# Patient Record
Sex: Female | Born: 1945 | Race: White | Hispanic: No | Smoking: Never smoker
Health system: Southern US, Community
[De-identification: ages and names within clinical notes are randomized; demographics above are authoritative.]

## PROBLEM LIST (undated history)

## (undated) DIAGNOSIS — Z87898 Personal history of other specified conditions: Secondary | ICD-10-CM

## (undated) DIAGNOSIS — E669 Obesity, unspecified: Secondary | ICD-10-CM

## (undated) DIAGNOSIS — N840 Polyp of corpus uteri: Secondary | ICD-10-CM

## (undated) DIAGNOSIS — M1990 Unspecified osteoarthritis, unspecified site: Secondary | ICD-10-CM

## (undated) DIAGNOSIS — N950 Postmenopausal bleeding: Secondary | ICD-10-CM

## (undated) DIAGNOSIS — Z8619 Personal history of other infectious and parasitic diseases: Secondary | ICD-10-CM

## (undated) DIAGNOSIS — R32 Unspecified urinary incontinence: Secondary | ICD-10-CM

## (undated) DIAGNOSIS — N951 Menopausal and female climacteric states: Secondary | ICD-10-CM

## (undated) DIAGNOSIS — Z9109 Other allergy status, other than to drugs and biological substances: Secondary | ICD-10-CM

## (undated) DIAGNOSIS — G4730 Sleep apnea, unspecified: Secondary | ICD-10-CM

## (undated) DIAGNOSIS — D649 Anemia, unspecified: Secondary | ICD-10-CM

## (undated) DIAGNOSIS — D219 Benign neoplasm of connective and other soft tissue, unspecified: Secondary | ICD-10-CM

## (undated) DIAGNOSIS — T7840XA Allergy, unspecified, initial encounter: Secondary | ICD-10-CM

## (undated) DIAGNOSIS — Z86010 Personal history of colonic polyps: Secondary | ICD-10-CM

## (undated) DIAGNOSIS — F419 Anxiety disorder, unspecified: Secondary | ICD-10-CM

## (undated) DIAGNOSIS — N924 Excessive bleeding in the premenopausal period: Secondary | ICD-10-CM

## (undated) DIAGNOSIS — M8580 Other specified disorders of bone density and structure, unspecified site: Secondary | ICD-10-CM

## (undated) DIAGNOSIS — N3010 Interstitial cystitis (chronic) without hematuria: Secondary | ICD-10-CM

## (undated) DIAGNOSIS — R300 Dysuria: Secondary | ICD-10-CM

## (undated) DIAGNOSIS — E039 Hypothyroidism, unspecified: Secondary | ICD-10-CM

## (undated) DIAGNOSIS — I10 Essential (primary) hypertension: Secondary | ICD-10-CM

## (undated) HISTORY — DX: Hypothyroidism, unspecified: E03.9

## (undated) HISTORY — DX: Unspecified osteoarthritis, unspecified site: M19.90

## (undated) HISTORY — DX: Personal history of other specified conditions: Z87.898

## (undated) HISTORY — DX: Postmenopausal bleeding: N95.0

## (undated) HISTORY — DX: Sleep apnea, unspecified: G47.30

## (undated) HISTORY — DX: Benign neoplasm of connective and other soft tissue, unspecified: D21.9

## (undated) HISTORY — DX: Anxiety disorder, unspecified: F41.9

## (undated) HISTORY — DX: Personal history of other infectious and parasitic diseases: Z86.19

## (undated) HISTORY — DX: Menopausal and female climacteric states: N95.1

## (undated) HISTORY — DX: Personal history of colonic polyps: Z86.010

## (undated) HISTORY — DX: Other specified disorders of bone density and structure, unspecified site: M85.80

## (undated) HISTORY — DX: Unspecified urinary incontinence: R32

## (undated) HISTORY — PX: MOUTH SURGERY: SHX715

## (undated) HISTORY — DX: Essential (primary) hypertension: I10

## (undated) HISTORY — DX: Excessive bleeding in the premenopausal period: N92.4

## (undated) HISTORY — DX: Interstitial cystitis (chronic) without hematuria: N30.10

## (undated) HISTORY — PX: HYSTEROSCOPY: SHX211

## (undated) HISTORY — DX: Obesity, unspecified: E66.9

## (undated) HISTORY — DX: Anemia, unspecified: D64.9

## (undated) HISTORY — DX: Other allergy status, other than to drugs and biological substances: Z91.09

## (undated) HISTORY — DX: Allergy, unspecified, initial encounter: T78.40XA

## (undated) HISTORY — DX: Polyp of corpus uteri: N84.0

## (undated) HISTORY — DX: Dysuria: R30.0

---

## 1972-03-06 HISTORY — PX: MYOMECTOMY: SHX85

## 1997-09-18 ENCOUNTER — Ambulatory Visit (HOSPITAL_COMMUNITY): Admission: RE | Admit: 1997-09-18 | Discharge: 1997-09-18 | Payer: Self-pay | Admitting: Otolaryngology

## 2001-02-27 ENCOUNTER — Emergency Department (HOSPITAL_COMMUNITY): Admission: EM | Admit: 2001-02-27 | Discharge: 2001-02-27 | Payer: Self-pay | Admitting: Emergency Medicine

## 2002-03-06 DIAGNOSIS — N951 Menopausal and female climacteric states: Secondary | ICD-10-CM

## 2002-03-06 DIAGNOSIS — R300 Dysuria: Secondary | ICD-10-CM

## 2002-03-06 HISTORY — DX: Menopausal and female climacteric states: N95.1

## 2002-03-06 HISTORY — DX: Dysuria: R30.0

## 2003-02-19 ENCOUNTER — Other Ambulatory Visit: Admission: RE | Admit: 2003-02-19 | Discharge: 2003-02-19 | Payer: Self-pay | Admitting: Obstetrics and Gynecology

## 2003-02-19 DIAGNOSIS — D219 Benign neoplasm of connective and other soft tissue, unspecified: Secondary | ICD-10-CM

## 2003-02-19 HISTORY — DX: Benign neoplasm of connective and other soft tissue, unspecified: D21.9

## 2003-03-03 ENCOUNTER — Ambulatory Visit (HOSPITAL_COMMUNITY): Admission: RE | Admit: 2003-03-03 | Discharge: 2003-03-03 | Payer: Self-pay | Admitting: Obstetrics and Gynecology

## 2003-03-07 DIAGNOSIS — N924 Excessive bleeding in the premenopausal period: Secondary | ICD-10-CM

## 2003-03-07 HISTORY — DX: Excessive bleeding in the premenopausal period: N92.4

## 2003-06-15 ENCOUNTER — Emergency Department (HOSPITAL_COMMUNITY): Admission: EM | Admit: 2003-06-15 | Discharge: 2003-06-15 | Payer: Self-pay

## 2004-03-06 DIAGNOSIS — N840 Polyp of corpus uteri: Secondary | ICD-10-CM

## 2004-03-06 HISTORY — DX: Polyp of corpus uteri: N84.0

## 2004-07-21 ENCOUNTER — Ambulatory Visit (HOSPITAL_COMMUNITY): Admission: RE | Admit: 2004-07-21 | Discharge: 2004-07-21 | Payer: Self-pay | Admitting: Obstetrics and Gynecology

## 2004-07-21 ENCOUNTER — Encounter (INDEPENDENT_AMBULATORY_CARE_PROVIDER_SITE_OTHER): Payer: Self-pay | Admitting: *Deleted

## 2004-08-03 ENCOUNTER — Other Ambulatory Visit: Admission: RE | Admit: 2004-08-03 | Discharge: 2004-08-03 | Payer: Self-pay | Admitting: Obstetrics and Gynecology

## 2004-08-22 ENCOUNTER — Ambulatory Visit (HOSPITAL_BASED_OUTPATIENT_CLINIC_OR_DEPARTMENT_OTHER): Admission: RE | Admit: 2004-08-22 | Discharge: 2004-08-22 | Payer: Self-pay | Admitting: Family Medicine

## 2004-08-28 ENCOUNTER — Ambulatory Visit: Payer: Self-pay | Admitting: Internal Medicine

## 2005-12-04 ENCOUNTER — Encounter: Admission: RE | Admit: 2005-12-04 | Discharge: 2005-12-04 | Payer: Self-pay | Admitting: Obstetrics and Gynecology

## 2006-06-25 ENCOUNTER — Encounter: Admission: RE | Admit: 2006-06-25 | Discharge: 2006-06-25 | Payer: Self-pay | Admitting: Family Medicine

## 2006-11-06 ENCOUNTER — Ambulatory Visit: Payer: Self-pay | Admitting: Cardiology

## 2007-01-22 ENCOUNTER — Encounter: Admission: RE | Admit: 2007-01-22 | Discharge: 2007-01-22 | Payer: Self-pay | Admitting: Family Medicine

## 2008-02-11 ENCOUNTER — Encounter: Admission: RE | Admit: 2008-02-11 | Discharge: 2008-02-11 | Payer: Self-pay | Admitting: Family Medicine

## 2009-03-06 DIAGNOSIS — R32 Unspecified urinary incontinence: Secondary | ICD-10-CM

## 2009-03-06 HISTORY — DX: Unspecified urinary incontinence: R32

## 2009-07-26 ENCOUNTER — Encounter: Admission: RE | Admit: 2009-07-26 | Discharge: 2009-07-26 | Payer: Self-pay | Admitting: Family Medicine

## 2010-03-01 ENCOUNTER — Ambulatory Visit (HOSPITAL_COMMUNITY): Admission: RE | Admit: 2010-03-01 | Payer: Self-pay | Admitting: Obstetrics and Gynecology

## 2010-03-24 ENCOUNTER — Emergency Department (HOSPITAL_COMMUNITY)
Admission: EM | Admit: 2010-03-24 | Discharge: 2010-03-25 | Payer: Self-pay | Source: Home / Self Care | Admitting: Emergency Medicine

## 2010-03-26 ENCOUNTER — Encounter: Payer: Self-pay | Admitting: Obstetrics and Gynecology

## 2010-03-28 LAB — DIFFERENTIAL
Basophils Absolute: 0 10*3/uL (ref 0.0–0.1)
Basophils Relative: 0 % (ref 0–1)
Eosinophils Absolute: 0.3 10*3/uL (ref 0.0–0.7)
Eosinophils Relative: 3 % (ref 0–5)
Lymphocytes Relative: 27 % (ref 12–46)
Lymphs Abs: 2.7 10*3/uL (ref 0.7–4.0)
Monocytes Absolute: 0.7 10*3/uL (ref 0.1–1.0)
Monocytes Relative: 7 % (ref 3–12)
Neutro Abs: 6.2 10*3/uL (ref 1.7–7.7)
Neutrophils Relative %: 63 % (ref 43–77)

## 2010-03-28 LAB — CBC
HCT: 40.1 % (ref 36.0–46.0)
Hemoglobin: 13.4 g/dL (ref 12.0–15.0)
MCH: 27.8 pg (ref 26.0–34.0)
MCHC: 33.4 g/dL (ref 30.0–36.0)
MCV: 83.2 fL (ref 78.0–100.0)
Platelets: 221 10*3/uL (ref 150–400)
RBC: 4.82 MIL/uL (ref 3.87–5.11)
RDW: 13.1 % (ref 11.5–15.5)
WBC: 9.8 10*3/uL (ref 4.0–10.5)

## 2010-03-28 LAB — BASIC METABOLIC PANEL
BUN: 14 mg/dL (ref 6–23)
CO2: 31 mEq/L (ref 19–32)
Calcium: 9.6 mg/dL (ref 8.4–10.5)
Chloride: 102 mEq/L (ref 96–112)
Creatinine, Ser: 0.74 mg/dL (ref 0.4–1.2)
GFR calc Af Amer: >60 mL/min (ref >60)
GFR calc non Af Amer: >60 mL/min (ref >60)
Glucose, Bld: 125 mg/dL — ABNORMAL HIGH (ref 70–99)
Potassium: 3.6 mEq/L (ref 3.5–5.1)
Sodium: 141 mEq/L (ref 135–145)

## 2010-06-21 DIAGNOSIS — N950 Postmenopausal bleeding: Secondary | ICD-10-CM

## 2010-06-21 HISTORY — DX: Postmenopausal bleeding: N95.0

## 2010-07-19 DIAGNOSIS — I10 Essential (primary) hypertension: Secondary | ICD-10-CM

## 2010-07-19 HISTORY — DX: Essential (primary) hypertension: I10

## 2010-07-19 NOTE — Assessment & Plan Note (Signed)
[facility]                            CARDIOLOGY OFFICE NOTE   [name], [name]                     MRN:          [id number]  DATE:11/06/2006                            DOB:          09-Apr-1945    HISTORY OF PRESENT ILLNESS:  The patient is a 65 year old female with  past medical history of hypertension, hypothyroidism who we were asked  to evaluate for bradycardia.   The patient has no prior cardiac history.  She does exercise routinely.  She typically does not have dyspnea on exertion, orthopnea, PND, pedal  edema, palpitations, presyncope, syncope or exertional chest pain.  She  does occasionally feel a thump in her chest, but this is short lived,  and this has not been a significant problem.  She was recently seen by  Dr. Smith Mince and was noted to be bradycardic on her electrocardiogram  with a heart rate in the high 40s.  Because of this, we were asked to  further evaluate.  There is no history of syncope.   MEDICATIONS:  1. Alendronate 70 mg week.  2. Benazepril 20 mg p.o. daily.  3. Hydrochlorothiazide 25 mg p.o. daily.  4. Cytomel 25 mcg one half p.o. daily.  5. Levothyroxine 88 mcg p.o. daily.   ALLERGIES:  SULFA.   SOCIAL HISTORY:  She does not smoke.  She rarely consumes alcohol .   FAMILY HISTORY:  Negative for coronary artery disease.   PAST MEDICAL HISTORY:  1. Significant for hypertension, but she denies any hyperlipidemia or      diabetes mellitus.  2. She does have hypothyroidism.  3. History of sleep apnea.  4. Osteopenia and vitamin D deficiency.  5. History of anxiety.  6. History of large fibroids and has had prior hysteroscopy.   REVIEW OF SYSTEMS:  She denies any headaches or fevers or chills.  There  is no productive cough or hemoptysis.  There is no dysphagia,  odynophagia, melena or hematochezia.  There is no dysuria, hematuria.  No rash, or seizure activity.  No orthopnea, PND or pedal edema.  No  claudication.  The remaining systems are negative.   PHYSICAL EXAMINATION:  VITAL SIGNS:  Blood pressure 159/80, pulse 60,  weight 171 pounds.  GENERAL:  Well-developed, well-nourished.  No acute distress.  She does  not appear to be depressed, although, mildly anxious.  SKIN:  Warm and dry.  BACK:  Normal.  HEENT:  Normal with normal eyelids.  NECK:  Supple with normal upstroke bilaterally.  Normal bruits.  No  jugular venous distention.  I cannot appreciate thyromegaly.  CHEST:  Clear to auscultation with normal expansion.  CARDIOVASCULAR:  Regular rate and rhythm.  There is an S4.  I cannot  appreciate a murmur or a rub.  ABDOMEN:  Nontender, positive bowel sounds.  No hepatosplenomegaly.  No  mass appreciated.  There is no abdominal bruits.  She has 2+ femoral  pulses bilaterally.  No bruits.  EXTREMITIES:  No edema, and I can palpate no cords.  She has 2+ dorsalis  pedis pulses bilaterally.  NEUROLOGICAL:  Grossly intact.   STUDIES:  Electrocardiogram today shows a sinus rhythm at a rate of 59.  There are no significant ST changes.   DIAGNOSES:  1. Bradycardia - the patient has no symptoms with this including no      recent syncope and no unexpected fatigue.  She also does not have      significant dyspnea or chest pain.  We will not pursue further      cardiac workup at this time.  2. History of an occasional pound - palpitations - we discussed      this, and this does not appear to be significantly bothersome to      Mrs. Theroux.  If her symptoms worsen in the future, then I have      offered an event monitor to more fully assess.  She will contact us      if she would like to pursue this.  3. Hypertension - her blood pressure is mildly elevated, and this will      need to be tracked in the future.  Her benazepril could be      increased as needed +/- the addition of a beta blocker.  4. Hypothyroidism - she will continue on her Levothyroxine.   We will see her back on  an as needed basis, and she is to contact us if  her palpations worsen and she would like to proceed with the event  monitor.     Madolyn Frieze Jens Som, MD, [facility]  Electronically Signed    BSC/MedQ  DD: 11/06/2006  DT: 11/07/2006  Job #: [id number]   cc:   Talmadge Coventry, M.D.

## 2010-07-22 ENCOUNTER — Encounter (HOSPITAL_COMMUNITY): Payer: Medicare Other

## 2010-07-22 ENCOUNTER — Other Ambulatory Visit: Payer: Self-pay | Admitting: Obstetrics and Gynecology

## 2010-07-22 LAB — CBC
HCT: 42 % (ref 36.0–46.0)
Hemoglobin: 13.7 g/dL (ref 12.0–15.0)
MCH: 27.8 pg (ref 26.0–34.0)
MCHC: 32.6 g/dL (ref 30.0–36.0)
MCV: 85.4 fL (ref 78.0–100.0)
Platelets: 267 10*3/uL (ref 150–400)
RBC: 4.92 MIL/uL (ref 3.87–5.11)
RDW: 13.6 % (ref 11.5–15.5)
WBC: 6.7 10*3/uL (ref 4.0–10.5)

## 2010-07-22 LAB — BASIC METABOLIC PANEL
BUN: 13 mg/dL (ref 6–23)
CO2: 28 mEq/L (ref 19–32)
Calcium: 9.6 mg/dL (ref 8.4–10.5)
Chloride: 99 mEq/L (ref 96–112)
Creatinine, Ser: 0.63 mg/dL (ref 0.4–1.2)
GFR calc Af Amer: >60 mL/min (ref >60)
GFR calc non Af Amer: >60 mL/min (ref >60)
Glucose, Bld: 102 mg/dL — ABNORMAL HIGH (ref 70–99)
Potassium: 3 mEq/L — ABNORMAL LOW (ref 3.5–5.1)
Sodium: 139 mEq/L (ref 135–145)

## 2010-07-22 NOTE — H&P (Signed)
NAMECAREEN, MAUCH            ACCOUNT NO.:  [id number]   MEDICAL RECORD NO.:  [id number]          PATIENT TYPE:  AMB   LOCATION:  [facility]                           [facility]   PHYSICIAN:  Hal Morales, M.D.DATE OF BIRTH:  09-18-1945   DATE OF ADMISSION:  DATE OF DISCHARGE:                                HISTORY & PHYSICAL   HISTORY OF PRESENT ILLNESS:  [name] is a 65 year old divorced  menopausal white female who presents for a hysteroscopy with D&C because of  uterine fibroids and post menopausal bleeding.  In September 2005 the  patient experienced menstrual-like bleeding, which lasted 8-9 days, during  which times she changed one pad daily.  The patient also reports mild  cramping during this episode.  At that time due to the patient's aversion to  pelvic exams and fear of anesthesia, she declined further evaluation via  endometrial biopsy and hysteroscopy.  The patient had had an MRI in January  2005, which showed an enlarged multilobular uterus with approximately 11  leiomyomata, the largest measuring 7 cm x 8 cm x 10 cm.  The patient's  endometrial cavity was distorted secondary to leiomyomatas changes in the  cervical and lower uterine body segment.  The lower endometrial cavity was  displaced to the right.  A leiomyoma measuring just over 15 mm in size was  seen in the submucosal region and distorted the endometrial canal from the  posterior right aspect.  Two submucosal leiomyomas measuring 1 cm and  approximately 18 mm in size were seen to significantly distort the fundal  portion of the endometrial cavity.  There appeared to be no abnormal  thickening of the endometrium and the overall uterine size measured 17 cm x  10 cm x 10-12 cm.  There was some extrinsic compression against the bladder  and rectum as a result of the leiomyomatas enlargement and exophytic 4 x 5  cm leiomyoma emanating from the posterior lower body segment of the uterus  was also seen,  with some extrinsic compression along the anterior rectal  wall.  The patient's ovaries were not definitively identified nor was there  any evidence of pelvic adenopathy.  In November 2005, at the patient's  request, a repeat MRI was performed, which revealed no changes from the  previous study.  In April 2006 the patient experienced she had another  episode of post menopausal bleeding (though a TSH done by her primary  physician was within normal limits), which lasted 1-1/2 weeks and required  the change of one pad daily.  After considerable discussions regarding  hysteroscopy with D&C to include its risks and the possibility of such  procedure being precluded by her fibroids, along with the fact that the  exact cause of her post menopausal bleeding may not be identified, the  patient has now decided to proceed with the hysteroscopy and D&C procedure.   OBSTETRICAL HISTORY:  Gravida 0.   GYNECOLOGICAL HISTORY:  Menarche 65 years old.  The patient has been  menopausal since age 45.  She has no history of sexually transmitted  diseases or abnormal pap smears.  Her last normal pap smear was 2004.   MEDICAL HISTORY:  1.  Hypothyroidism.  2.  Hypertension.  3.  Anxiety/depression.  4.  Sleep apnea.  5.  Stress urinary incontinence.  6.  Osteopenia.  7.  Mild hyperlipidemia.  8.  Carpal tunnel syndrome.  9.  Claustrophobia.  10. Right foot fracture.   SURGICAL HISTORY:  1.  In 1985 myomectomy.  2.  In 2006 gum surgery requiring a bone graft.  3.  The patient denies any history of blood transfusions.   FAMILY HISTORY:  Positive for hypertension, strokes, colon cancer, breast  cancer, (paternal cousin at age 79).   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   HABITS:  The patient does not use tobacco.  She occasionally consumes  alcohol.   CURRENT MEDICATIONS:  1.  Levothyroxine 88 mcg  one tablet daily.  2.  Cytomel 12.5 mcg one tablet daily.  3.  Benazepril 10 mg one tablet daily.  4.  Maxzide (generic) 37.5/25 one tablet daily.  5.  Vitamin C 1000 mg daily.   ALLERGIES:  SULFA WHICH CAUSES A RASH.   REVIEW OF SYSTEMS:  The patient experiences stress urinary incontinence.  She does wears glasses.  She experiences mild constipation, sinusitis, and  positional vertigo.   PHYSICAL EXAMINATION:  VITAL SIGNS:  Blood pressure 128/80.  Height 5 feet  1/4 inch tall, weight 182 pounds.  NECK:  Supple without masses.  There is no adenopathy or thyromegaly.  HEART:  Regular rate and rhythm.  There is no murmur.  LUNGS:  Clear to auscultation.  There are no wheezes, rales, or rhonchi.  BACK:  No CVA tenderness.  ABDOMEN:  Bowel sounds are present.  It is soft without tenderness,  guarding, rebound, or organomegaly.  EXTREMITIES:  No cyanosis, clubbing, or edema.  NEUROLOGIC:  Cranial nerves II-XII are within normal limits.  Motor strength  was symmetric.  Sensory was intact distally to light touch.  The patient's  gait was within normal limits.  Heel toe tandem walking were within normal  limits.  Negative Romberg.  PELVIC:  Refused by patient due to her aversion to the same.  The patient's  pelvic exam done November 18, 2003 revealed EGVUS within normal limits.  The vagina within normal limits.  The cervix was without tenderness.  The  uterus was enlarged with posterior irregularity, approximately 16 weeks  size, but the exam was limited by poor patient tolerance of the exam.  Adnexa was without any masses or tenderness.  Rectovaginal exam confirmed.   IMPRESSION:  1.  Post menopausal bleeding.  2.  Large uterine fibroids.   DISPOSITION:  Several lengthy discussions were held with the patient  regarding the indications for her procedure along with its risks, which  include but are not limited to, reaction to anesthesia, damage to adjacent organs, infection, and  excessive bleeding.  The patient further understands  that her procedure may be precluded by the  positioning of her uterine fibroids and that this procedure may not  specifically identify nor relieve her symptoms of post menopausal bleeding.  The patient has consented to proceed with a hysteroscopy with D&C and  fibroid removal at [facility] in [place] on Jul 21, 2004 at 9:30  a.m.      EJP/MEDQ  D:  07/20/2004  T:  07/20/2004  Job:  [id number]

## 2010-07-22 NOTE — Procedures (Signed)
[name], [name]            ACCOUNT NO.:  [id number]   MEDICAL RECORD NO.:  [id number]          PATIENT TYPE:  OUT   LOCATION:  SLEEP CENTER                 FACILITY:  [facility]   PHYSICIAN:  Clinton D. Maple Hudson, M.D. DATE OF BIRTH:  11-06-1945   DATE OF STUDY:  08/22/2004                              NOCTURNAL POLYSOMNOGRAM   REFERRING PHYSICIAN:  Sharlot Gowda, M.D.   INDICATION FOR STUDY:  Hypersomnia with sleep apnea.   Epworth Sleepiness Score 15/24, BMI 33, weight 177 pounds.   SLEEP ARCHITECTURE:  Total sleep time 229 minutes with sleep efficiency only  49%/  Stage I was 7%, stage II 27%, stages III and IV 49%, REM 16% of total  sleep time.  Sleep latency 53 minutes, REM latency 128 minutes, awake after  sleep onset 173 minutes, arousal index 20.  No bedtime medication taken.   RESPIRATORY DATA:  Respiratory disturbance index (RDI, AHI) 24.1 obstructive  events per hour, indicating moderate obstructive sleep apnea/hypopnea  syndrome.  There were 27 obstructive apneas and 65 hypopneas.  Events were  not positional.  REM RDI 32.  She qualified for split study protocol as  requested, but was uncomfortable with all CPAP masks tried (mask too heavy,  nose burns, etc.)  Technician tried many masks, finally settled with her on  a Respironics Comfort Light Series II petite nasal pillows mask.  She  sustained sleep only briefly at 11 CWP, the woke and did not return to sleep  for any sustained interval subsequently.  Adequate split protocol titration  could not be achieved.   OXYGEN DATA:  Moderate snoring with oxygen desaturation to a nadir of 83%.  Mean oxygen saturation through the study was 96% on room air.   CARDIAC DATA:  Normal sinus rhythm with occasional PVC.   MOVEMENT/PARASOMNIA:  A total of 60 limb jerks were recorded of which 6 were  associated with arousal or awakening for a periodic limb movement with  arousal index of 1.6 per hour which is probably insignificant.   Bathroom x  2.  The patient complained of several discomforts.  She required ear plugs  to keep out ambient noise and was quite uncomfortable with routine  attachment and removal of monitoring leads.   IMPRESSION/RECOMMENDATIONS:  1.  Moderate obstructive sleep apnea/hypopnea syndrome, RDI 24 per hour with      moderate snoring and oxygen desaturation to 83%.  2.  Very limited tolerance for extrinsic discomforts including initial trial      with CPAP.  It may be possible for her to      work with Daphine Deutscher, Laboratory Director, for daytime      desensitization and improved CPAP tolerance, perhaps supplemented at      home by a sleep medication.  Otherwise need to consider alternative      therapies.      Clinton D. Maple Hudson, M.D.  Diplomat    CDY/MEDQ  D:  08/28/2004 13:00:03  T:  08/28/2004 21:58:43  Job:  [id number]   cc:   Sharlot Gowda, M.D.  [address]  [place], [place] [address]  Fax: [phone]

## 2010-07-22 NOTE — Op Note (Signed)
[name], [name]            ACCOUNT NO.:  [id number]   MEDICAL RECORD NO.:  [id number]          PATIENT TYPE:  AMB   LOCATION:  [facility]                           [facility]ILITY:  [facility]   PHYSICIAN:  Hal Morales, M.D.DATE OF BIRTH:  12/25/1945   DATE OF PROCEDURE:  07/21/2004  DATE OF DISCHARGE:                                 OPERATIVE REPORT   PREOPERATIVE DIAGNOSES:  1.  Postmenopausal bleeding.  2.  Uterine fibroids.   POSTOPERATIVE DIAGNOSES:  1.  Postmenopausal bleeding.  2.  Uterine fibroids.  3.  Endometrial polyps.   PROCEDURE:  Operative hysteroscopy, endometrial polypectomy, uterine  curettage.   SURGEON:  Hal Morales, M.D.   OBSERVER:  Laurena Slimmer, student.   ANESTHESIA:  General LMA.   ESTIMATED BLOOD LOSS:  Less than 10 cc.   COMPLICATIONS:  None.   FINDINGS:  The uterus was enlarged to approximately 18-week size on bimanual  examination.  It sounded to 13 cm.  There was a 4 cm endometrial polyp and a  6 cm endometrial polyp with an apparent necrotic tip.  There was a small  amount of endometrial curettings.   PROCEDURE:  The patient was taken to the operating room after appropriate  identification and placed on the operating table.  After the attainment of  adequate general anesthesia, she was placed in the lithotomy position.  The  perineum and vagina were prepped with multiple layers of Betadine and draped  as a sterile field.  A red Robinson catheter was used to empty the bladder.  A Peterson speculum was placed in the vagina, and a single-tooth tenaculum  placed on the anterior cervix.  A paracervical block was achieved with a  total of 10 cc of 2% Xylocaine in the 5 and 7 o'clock positions.  The cervix  was dilated to first accommodate the uterine sound and then to accommodate  the diagnostic hysteroscope.  The diagnostic hysteroscope was inserted and  the above-noted findings made.  Because the hysteroscope would not reach the  fundus of  the uterus, it was decided to remove the hysteroscope, and the  Randall stone forceps were used in combination with sharp curettage to  remove the two endometrial polyps.  Visualization of the uterine cavity  after polyp removal documented that the polyps had been removed, and only a  small anterior uterine fibroid remained.  All instruments were then removed  from the  endometrial cavity, and the patient was awakened from general anesthesia and  taken to the recovery room in satisfactory condition, having tolerated the  procedure well, with sponge and instrument counts correct.   SPECIMENS TO PATHOLOGY:  4 cm polyp, 6 cm polyp, and endometrial curettings.      VPH/MEDQ  D:  07/21/2004  T:  07/21/2004  Job:  [id number]   cc:   Talmadge Coventry, M.D.  [address]  [place]  [place] [address]  Fax: [phone]

## 2010-07-25 ENCOUNTER — Emergency Department (HOSPITAL_COMMUNITY)
Admission: EM | Admit: 2010-07-25 | Discharge: 2010-07-25 | Disposition: A | Discharge status: Home / Self Care | Payer: Medicare Other | Attending: Emergency Medicine | Admitting: Emergency Medicine

## 2010-07-25 DIAGNOSIS — E039 Hypothyroidism, unspecified: Secondary | ICD-10-CM | POA: Insufficient documentation

## 2010-07-25 DIAGNOSIS — G4730 Sleep apnea, unspecified: Secondary | ICD-10-CM | POA: Insufficient documentation

## 2010-07-25 DIAGNOSIS — F411 Generalized anxiety disorder: Secondary | ICD-10-CM | POA: Insufficient documentation

## 2010-07-25 DIAGNOSIS — I10 Essential (primary) hypertension: Secondary | ICD-10-CM | POA: Insufficient documentation

## 2010-07-25 DIAGNOSIS — R0789 Other chest pain: Secondary | ICD-10-CM | POA: Insufficient documentation

## 2010-07-25 LAB — POCT CARDIAC MARKERS
CKMB, poc: <1 ng/mL — ABNORMAL LOW (ref 1.0–8.0)
Myoglobin, poc: 41.7 ng/mL (ref 12–200)
Troponin i, poc: <0.05 ng/mL (ref 0.00–0.09)

## 2010-07-29 ENCOUNTER — Other Ambulatory Visit: Payer: Self-pay | Admitting: Obstetrics and Gynecology

## 2010-07-29 ENCOUNTER — Ambulatory Visit (HOSPITAL_COMMUNITY)
Admission: RE | Admit: 2010-07-29 | Discharge: 2010-07-29 | Disposition: A | Discharge status: Home / Self Care | Payer: Medicare Other | Source: Ambulatory Visit | Attending: Obstetrics and Gynecology | Admitting: Obstetrics and Gynecology

## 2010-07-29 DIAGNOSIS — Z01812 Encounter for preprocedural laboratory examination: Secondary | ICD-10-CM | POA: Insufficient documentation

## 2010-07-29 DIAGNOSIS — IMO0002 Reserved for concepts with insufficient information to code with codable children: Secondary | ICD-10-CM | POA: Insufficient documentation

## 2010-07-29 DIAGNOSIS — N840 Polyp of corpus uteri: Secondary | ICD-10-CM | POA: Insufficient documentation

## 2010-07-29 DIAGNOSIS — Y9210 Unspecified residential institution as the place of occurrence of the external cause: Secondary | ICD-10-CM | POA: Insufficient documentation

## 2010-07-29 DIAGNOSIS — D250 Submucous leiomyoma of uterus: Secondary | ICD-10-CM | POA: Insufficient documentation

## 2010-07-29 DIAGNOSIS — Z01818 Encounter for other preprocedural examination: Secondary | ICD-10-CM | POA: Insufficient documentation

## 2010-07-29 DIAGNOSIS — N950 Postmenopausal bleeding: Secondary | ICD-10-CM | POA: Insufficient documentation

## 2010-07-29 LAB — POTASSIUM: Potassium: 3.3 mEq/L — ABNORMAL LOW (ref 3.5–5.1)

## 2010-08-01 ENCOUNTER — Inpatient Hospital Stay (HOSPITAL_COMMUNITY)
Admission: AD | Admit: 2010-08-01 | Discharge: 2010-08-01 | Disposition: A | Discharge status: Home / Self Care | Payer: Medicare Other | Source: Ambulatory Visit | Attending: Obstetrics and Gynecology | Admitting: Obstetrics and Gynecology

## 2010-08-01 DIAGNOSIS — F411 Generalized anxiety disorder: Secondary | ICD-10-CM | POA: Insufficient documentation

## 2010-08-01 DIAGNOSIS — I10 Essential (primary) hypertension: Secondary | ICD-10-CM | POA: Insufficient documentation

## 2010-08-01 DIAGNOSIS — R109 Unspecified abdominal pain: Secondary | ICD-10-CM | POA: Insufficient documentation

## 2010-08-01 LAB — COMPREHENSIVE METABOLIC PANEL
ALT: 13 U/L (ref 0–35)
AST: 14 U/L (ref 0–37)
Albumin: 3.7 g/dL (ref 3.5–5.2)
Alkaline Phosphatase: 56 U/L (ref 39–117)
BUN: 13 mg/dL (ref 6–23)
CO2: 30 mEq/L (ref 19–32)
Calcium: 9.6 mg/dL (ref 8.4–10.5)
Chloride: 97 mEq/L (ref 96–112)
Creatinine, Ser: 0.69 mg/dL (ref 0.4–1.2)
GFR calc Af Amer: >60 mL/min (ref >60)
GFR calc non Af Amer: >60 mL/min (ref >60)
Glucose, Bld: 117 mg/dL — ABNORMAL HIGH (ref 70–99)
Potassium: 3.7 mEq/L (ref 3.5–5.1)
Sodium: 138 mEq/L (ref 135–145)
Total Bilirubin: 0.4 mg/dL (ref 0.3–1.2)
Total Protein: 7.1 g/dL (ref 6.0–8.3)

## 2010-08-01 LAB — CBC
HCT: 40.1 % (ref 36.0–46.0)
Hemoglobin: 13.1 g/dL (ref 12.0–15.0)
MCH: 27.5 pg (ref 26.0–34.0)
MCHC: 32.7 g/dL (ref 30.0–36.0)
MCV: 84.2 fL (ref 78.0–100.0)
Platelets: 239 10*3/uL (ref 150–400)
RBC: 4.76 MIL/uL (ref 3.87–5.11)
RDW: 13.2 % (ref 11.5–15.5)
WBC: 8.4 10*3/uL (ref 4.0–10.5)

## 2010-08-01 LAB — URINALYSIS, ROUTINE W REFLEX MICROSCOPIC
Bilirubin Urine: NEGATIVE
Glucose, UA: NEGATIVE mg/dL
Ketones, ur: NEGATIVE mg/dL
Nitrite: NEGATIVE
Protein, ur: NEGATIVE mg/dL
Specific Gravity, Urine: <1.005 — ABNORMAL LOW (ref 1.005–1.030)
Urobilinogen, UA: 0.2 mg/dL (ref 0.0–1.0)
pH: 6.5 (ref 5.0–8.0)

## 2010-08-01 LAB — URINE MICROSCOPIC-ADD ON

## 2010-08-05 NOTE — Op Note (Signed)
[name], [name]            ACCOUNT NO.:  [id number]  MEDICAL RECORD NO.:  [id number]           PATIENT TYPE:  O  LOCATION:  [facility]                          [facility]  PHYSICIAN:  Hal Morales, M.D.DATE OF BIRTH:  [birth year]  DATE OF PROCEDURE:  07/29/2010 DATE OF DISCHARGE:                              OPERATIVE REPORT   PREOPERATIVE DIAGNOSES:  Postmenopausal bleeding, uterine fibroids, history of endometrial polyp.  POSTOPERATIVE DIAGNOSES:  Postmenopausal bleeding, uterine fibroids, history of endometrial polyp, and endometrial polyps.  PROCEDURE:  Hysteroscopy dilation and curettage.  SURGEON:  Dr. Dierdre Forth  ANESTHESIA:  General.  ESTIMATED BLOOD LOSS:  Less than 10 mL.  COMPLICATIONS:  The patient's cervix was markedly stenotic and atrophic cervix made grasping the cervix for traction very difficult, so that the operative hysteroscope could not be inserted into the uterus even after several attempts.  On the last attempt at insertion of the operative hysteroscope, the fluid deficit increased significantly over approximately 2 minutes from [id number] mL and uterine distension could not be maintained.  Though no definite site of perforation could be identified.  The presumptive diagnosis was uterine perforation and the procedure was discontinued.  PROCEDURE:  The patient was taken to the operating room after appropriate identification and placed on the operating table.  After the attainment of adequate general anesthesia, she was placed in the lithotomy position.  The perineum and vagina were prepped with multiple layers of Betadine and a red Robinson catheter used to empty the bladder.  The perineum was draped as a sterile field.  A Graves speculum was placed in the posterior vagina.  A paracervical block was achieved with a total of 10 mL of 2% Xylocaine in the 5 and 7 o'clock positions. The speculum was then removed and the aforementioned skin tag  on the left posterior perineum was infiltrated with 2% Xylocaine.  It was sharply excised and a suture of 3-0 Monocryl used to close the excision site with adequate hemostasis.  The Graves speculum was replaced and a single-tooth tenaculum placed on the cervix.  The uterus sounded to more than 10 cm but sounding was difficult because of the acute angle of the endometrial cavity.  The cervix was then dilated to accommodate the diagnostic hysteroscope and this was inserted with the findings of several small endometrial polyps and submucosal myomata with minimal portion of the myoma within the endometrial cavity.  The remainder of the endometrium was atrophic.  The diagnostic hysteroscope was removed, and the cervix was dilated to accommodate the operative hysteroscope, however, insertion of the operative hysteroscope could not be achieved because of a combination of atrophy of the cervix precluding grasping for traction with out tearing through the atrophic tissue, the anterior angle of the endometrial cavity, and the increased size of the operative hysteroscope of 2.9 cm.  Several attempts were made to insert the operative hysteroscope and then the endometrial cavity was curetted on two occasions with the production of a small amount of tissue.  On the last attempt at placement of the operative hysteroscope, the fluid deficit went from 700 mL to 1000 mL within 2 minutes  with the presumptive diagnosis of uterine perforation.  The procedure was discontinued.  All instruments were removed from the vagina, and the patient awakened from general anesthesia and taken to the recovery room in satisfactory condition, having tolerated the procedure well with sponge and instrument counts correct.  She is to be observed over 2 hours to determine her stability for discharge home.  SPECIMENS TO PATHOLOGY:  Endometrial curettings.  Discharge instructions printed instructions from the [facility] for  hysteroscopy D and C.  DISCHARGE MEDICATIONS:  Home medications; 1. Benazepril-hydrochlorothiazide 20/12.5 mg 2 tablets p.o. daily. 2. Carvedilol 6.5 mg one tablet twice daily. 3. Hydralazine 25 mg 3 tablets p.o. twice daily. 4. Liothyronine 12.5 mcg one tablet daily. 5. Levothroid 88 mcg one p.o. daily. 6. Clonidine 0.1 mg one p.o. daily. 7. Alendronate 70 mg one p.o. weekly.  Prescription medications: Ibuprofen 600 mg p.o. q.6 h for 3 days and then p.o. q.6 h p.r.n. pain.  FOLLOWUP INSTRUCTIONS:  The patient is to follow up with Dr. Pennie Rushing in 2 weeks, and she has that appointment.     Hal Morales, M.D.     VPH/MEDQ  D:  07/29/2010  T:  07/30/2010  Job:  [id number]  Electronically Signed by Dierdre Forth M.D. on 08/05/2010 01:02:42 AM

## 2010-08-05 NOTE — H&P (Signed)
[name], [name]            ACCOUNT NO.:  [id number]  MEDICAL RECORD NO.:  [id number]           PATIENT TYPE:  O  LOCATION:  [facility]                          [facility]  [facility]  PHYSICIAN:  Hal Morales, M.D.DATE OF BIRTH:  [birth year]  DATE OF ADMISSION:  07/29/2010 DATE OF DISCHARGE:                             HISTORY & PHYSICAL   HISTORY OF PRESENT ILLNESS:  The patient is a 65 year old white single female para 0 who presents for evaluation of postmenopausal bleeding. The patient underwent menopause approximately 10 years ago and has had several episodes of postmenopausal bleeding with this most recent one occurring over approximately the last year.  The bleeding is not always only spotting and is intermittent, unrelated to any activities.  The patient was unable to tolerate pelvic examination in the office that would allow any further evaluation and so she is brought to the operating room for hysteroscopic evaluation.  PAST MEDICAL HISTORY:  GYNECOLOGICAL HISTORY:  The patient had a previous abdominal myomectomy in 1985 and since that time has had recurrence of the uterine fibroids, generally without symptoms.  She has no history of abnormal Pap smears and her last Pap smear was in June 2011 and was within normal limits.  The patient had a previous episode of postmenopausal bleeding in 2005 and in May 2006 underwent hysteroscopy and polypectomy with benign endometrial polyp noted.  OBSTETRICAL HISTORY:  The patient has never had a pregnancy.  MEDICAL HISTORY:  The patient is followed for chronic hypertension, hypothyroidism, depression, anxiety, and osteopenia.  FAMILY HISTORY:  Positive for chronic hypertension and strokes as well as migraine headaches.  CURRENT MEDICATIONS: 1. Clonidine 0.1 mg daily. 2. Carvedilol 12.5 mg p.o. b.i.d. 3. Benazepril and hydrochlorothiazide 20/12.5 mg 1 p.o. daily. 4. Levothyroxine 88 mcg p.o. daily. 5. Liothyronine 12.5 mg  p.o. daily. 6. Alendronate 70 mg p.o. q. week. 7. Vitamin D 2000 International Units daily. 8. Over-the-counter B and C complex.  DRUG SENSITIVITIES: 1. SULFA. 2. ERYTHROMYCIN. 3. EPINEPHRINE.  SOCIAL HISTORY:  The patient is single and works in Therapist, art work.  She does not smoke cigarettes.  She drinks less than 1 alcoholic beverage per week.  REVIEW OF SYSTEMS:  Positive for severe anxiety.  The patient actually had a visit to the [facility] on Jul 25, 2010 because of a panic attack and was started on lorazepam.  PHYSICAL EXAMINATION:  GENERAL:  The patient is an obese white female in no acute distress. VITAL SIGNS:  Temperature is 98.4, pulse is 53, respirations 16, blood pressure is 150/78, and weight is 187 pounds. LUNGS:  Clear. HEART:  Regular rate and rhythm. ABDOMEN:  Soft with a suprapubic mass that extends to within 5 cm of the umbilicus and is nontender.  Bowel sounds are normal. EXTREMITIES:  No clubbing, cyanosis, or edema. PELVIC:  EGBUS within normal limits.  The vagina is atrophic.  The cervix is small and atrophic.  Bimanual examination is poorly tolerated by the patient but is consistent with the aforementioned large pelvic mass most consistent with uterine fibroids.  Rectovaginal examination is negative.  IMAGING:  Ultrasound evaluation done  on September 02, 2009 documents these large uterine fibroids with an 8-cm pedunculated fundal fibroid.  The ovary on the right is visualized and is within normal limits.  IMPRESSION: 1. Second episode of postmenopausal bleeding in the last 10 years with     previous episode in 2006 revealing a benign endometrial polyp. 2. Poor tolerance of pelvic examination.  DISPOSITION:  A discussion was held with the patient concerning the need for evaluation of her postmenopausal bleeding and she agrees to undergo hysteroscopy and polypectomy if indeed a polyp is found.  The risks  of anesthesia, bleeding, infection, and damage to adjacent organs as well as uterine perforation are explained to the patient and she wishes to proceed.  This was done at [facility] on Jul 29, 2010.     Hal Morales, M.D.     VPH/MEDQ  D:  07/29/2010  T:  07/30/2010  Job:  [id number]  Electronically Signed by Dierdre Forth M.D. on 08/05/2010 01:03:12 AM

## 2010-08-15 NOTE — Consult Note (Signed)
NAMEMARVEL, SAPP            ACCOUNT NO.:  [id number]  MEDICAL RECORD NO.:  [id number]           PATIENT TYPE:  O  LOCATION:  [facility]                         [facility]  PHYSICIAN:  Janine Limbo, M.D.DATE OF BIRTH:  07-Jun-1945  DATE OF CONSULTATION:  08/01/2010 DATE OF DISCHARGE:  08/01/2010                                CONSULTATION   HISTORY OF PRESENT ILLNESS:  Ms. Pharris is a 65 year old female, gravida 0, who presents complaining of vague abdominal pain.  She says she thinks it may be "gas."  The patient has been followed at the [facility] for [facility].  On Jul 29, 2010, the patient had hysteroscopy with dilatation and curettage performed by Dr. Dierdre Forth.  The patient complained of postmenopausal bleeding.  She has a known history of endometrial polyps and uterine fibroids.  The patient cervix was noted to be markedly stenotic and atrophic.  It was very difficult to grasp the cervix and to apply traction.  It was difficult to insert the hysteroscope.  There was noted to be a dramatic change in the fluid deficit, although there was no definitive sign of perforation that could be identified.  The presumptive diagnosis was uterine perforation and the procedure was discontinued.  The patient was discharged to home doing well.  The patient reports that she did notice a small amount of vaginal bleeding on Jul 30, 2010.  She reports today that she has had a vague abdominal pain, but she thinks it is "gas."  Her pain is actually better today than it was yesterday.  The patient had a normal bowel movement this morning.  She is tolerating a regular diet without difficulty.  She denies fever or chills.  The patient had an abdominal myomectomy in 1985.  She has had a prior episode of postmenopausal bleeding (2005 and 2006).  Hysteroscopy and polypectomy were performed at that time.  Pathology report was benign.  PAST  MEDICAL HISTORY:  The patient has chronic hypertension, hypothyroidism, depression, anxiety, and osteopenia.  Her current medications include clonidine, carvedilol, benazepril, and hydrochlorothiazide, liothyronine, alendronate, vitamin D, over-the- counter vitamin D and vitamin C complex, and Ativan.  DRUG ALLERGIES:  SULFA MEDICATIONS, ERYTHROMYCIN, and EPINEPHRINE.  SOCIAL HISTORY:  The patient drinks alcohol socially.  She denies cigarette use and other recreational drug uses.  REVIEW OF SYSTEMS:  Please see history of present illness.  FAMILY HISTORY:  Noncontributory.  PHYSICAL EXAMINATION:  VITAL SIGNS:  Blood pressure is 195/70, pulse is 53, respirations 16, and temperature is 98.3. HEENT:  Within normal limits except for an anxious appearing female. CHEST:  Clear. HEART:  Regular rate and rhythm. BACK:  Nontender. ABDOMEN:  Nontender. EXTREMITIES:  Grossly normal. PELVIC:  External genitalia is normal except for atrophic changes.  The vagina is nontender (the patient is very anxious about pelvic exam). Cervix is nontender.  Uterus is normal size, shape, and consistency. Adnexa no masses.  LABORATORY DATA:  Laboratory values urinalysis was negative.  Complete blood count and basic metabolic panel are pending.  ASSESSMENT: 1. Vague abdominal pain consistent with "gas." 2. Anxiety. 3. Hypertension.  4. Multiple other medical problems.  PLAN:  We will check the patient's complete blood count and basic metabolic panel before she is discharged.  She will be allowed to go home with diet instructions.  She has ibuprofen 600 mg every 6 hours as needed for discomfort.  She was told to call for questions or concerns. She will follow up with Dr. Pennie Rushing as previously planned.     Janine Limbo, M.D.     AVS/MEDQ  D:  08/01/2010  T:  08/02/2010  Job:  [id number]  Electronically Signed by Kirkland Hun M.D. on 08/15/2010 03:49:14 PM

## 2010-09-26 ENCOUNTER — Encounter: Payer: Self-pay | Admitting: Internal Medicine

## 2010-10-14 ENCOUNTER — Ambulatory Visit: Payer: Medicare Other | Admitting: *Deleted

## 2010-10-14 VITALS — Ht 61.0 in | Wt 178.2 lb

## 2010-10-14 DIAGNOSIS — K921 Melena: Secondary | ICD-10-CM

## 2010-10-14 DIAGNOSIS — D649 Anemia, unspecified: Secondary | ICD-10-CM

## 2010-10-14 MED ORDER — PEG-KCL-NACL-NASULF-NA ASC-C 100 G PO SOLR: ORAL | Status: DC

## 2010-10-14 NOTE — Progress Notes (Signed)
Pt here for PV for direct colonoscopy.  Referred by Dr. Nadyne Coombes for colonoscopy for anemia and positive hemasure. Pt has lot of anxiety about having anesthesia or sedation.  Talked with patient about Propofol and was still anxious and uncomfortable about having procedure.  Colonoscopy for 8/27 cancelled and new patient appt. Scheduled with Dr. Leone Payor.

## 2010-10-28 ENCOUNTER — Other Ambulatory Visit: Payer: Medicare Other | Admitting: Internal Medicine

## 2010-10-31 ENCOUNTER — Other Ambulatory Visit: Payer: Medicare Other | Admitting: Internal Medicine

## 2010-11-17 ENCOUNTER — Ambulatory Visit (INDEPENDENT_AMBULATORY_CARE_PROVIDER_SITE_OTHER): Payer: Medicare Other | Admitting: Internal Medicine

## 2010-11-17 ENCOUNTER — Other Ambulatory Visit (INDEPENDENT_AMBULATORY_CARE_PROVIDER_SITE_OTHER): Payer: Medicare Other

## 2010-11-17 ENCOUNTER — Encounter: Payer: Self-pay | Admitting: Internal Medicine

## 2010-11-17 DIAGNOSIS — F411 Generalized anxiety disorder: Secondary | ICD-10-CM

## 2010-11-17 DIAGNOSIS — R195 Other fecal abnormalities: Secondary | ICD-10-CM | POA: Insufficient documentation

## 2010-11-17 DIAGNOSIS — D649 Anemia, unspecified: Secondary | ICD-10-CM

## 2010-11-17 DIAGNOSIS — G4730 Sleep apnea, unspecified: Secondary | ICD-10-CM | POA: Insufficient documentation

## 2010-11-17 DIAGNOSIS — F419 Anxiety disorder, unspecified: Secondary | ICD-10-CM | POA: Insufficient documentation

## 2010-11-17 LAB — CBC WITH DIFFERENTIAL/PLATELET
Basophils Relative: 0.4 % (ref 0.0–3.0)
Eosinophils Relative: 4.1 % (ref 0.0–5.0)
HCT: 38.8 % (ref 36.0–46.0)
Hemoglobin: 12.8 g/dL (ref 12.0–15.0)
Lymphocytes Relative: 24.5 % (ref 12.0–46.0)
Lymphs Abs: 1.9 10*3/uL (ref 0.7–4.0)
Monocytes Relative: 6.5 % (ref 3.0–12.0)
Neutro Abs: 5 10*3/uL (ref 1.4–7.7)
RBC: 4.64 Mil/uL (ref 3.87–5.11)

## 2010-11-17 NOTE — Assessment & Plan Note (Signed)
July 2012. Etiology unclear. Await colonoscopy results.

## 2010-11-17 NOTE — Patient Instructions (Addendum)
Please go to the basement upon leaving today to have your labs done. You have been scheduled for a Colonoscopy with separate instructions given.

## 2010-11-17 NOTE — Progress Notes (Signed)
  Subjective:    Patient ID: [name], female    DOB: Oct 20, 1945, 65 y.o.   MRN: [id number]  HPI 65 year old white woman presents to discuss her heme positive stool, anemia and need for colonoscopy. She was seen by the nurse for a direct colonoscopy. She had many questions and concerns about the procedure. Should any motion or positive stool and urgent medical and family care. She has an anemia with a hemoglobin of 10.9 in mid July. 2 normal hemoglobin 12 and May. She had a hysteroscopy and had bleeding after that, after that hemoglobin it will be before hemoglobin 10.9. She does not really have much in the way of other GI symptoms at this time except for occasional gas and variability in stools but this is chronic and really not frequent. He has not seen any bleeding from her GI tract.  She openly admits to being anxious about the procedure and the prep. She has multiple questions about this, whether she would wake up from anesthesia. I looked and saw that she had protocol with medazepam and fentanyl during her hysteroscopy and she was reassured to know that she awakened after that.   Review of Systems As of her anxiety. Fatigue. Insomnia. Urinary frequency and some urinary incontinence at times. All other review of systems negative or as outlined above. Note that I reviewed her medical records from the urgent medical and family care including labs.    Objective:   Physical Exam General: Obese, well-developed, well-nourished and in no acute distress Vitals: Reviewed and listed above Eyes:anicteric. Mouth and posterior pharynx: normal.  Neck: supple w/o thyromegaly or mass.  Lungs: clear. Heart: S1S2, no rubs, murmurs, gallops. Abdomen: obese, soft, non-tender, no hepatosplenomegaly, hernia, or mass and BS+.  Rectal:  deferred Lymphatics: no cervical, Heidelberg nodes. Extremities:  no edema Skin sebaceous cyst at base of neck on left ("x 30 years") Neuro: nonfocal. A&O x 3.  Psych:  anxiety        Assessment & Plan:

## 2010-11-17 NOTE — Assessment & Plan Note (Signed)
Seems quite likely to be related to the hysteroscopy and bleeding after that. However she was Hemoccult positive indicating a colonic source of occult blood. We'll see if that's linked to her anemia, colonoscopy will be used to investigate. Risks benefits and indications are explained she understands and agrees to proceed. Repeat CBC is ordered.

## 2010-11-17 NOTE — Progress Notes (Signed)
Quick Note:  CBC normal Still needs colonscopy if she asks ______

## 2010-11-17 NOTE — Assessment & Plan Note (Signed)
Amount of baseline anxiety and anticipatory anxiety over this procedure. I answered her questions as best I could and probably reviewed the indications risks and benefits of colonoscopy and sedation including moderate and deep sedation. Because of this and other medical conditions it seems appropriate for her to be sedated deeply with propofol using a nurse anesthetist. Maryclare Labrador plan for that.

## 2010-11-29 ENCOUNTER — Encounter: Payer: Medicare Other | Admitting: Internal Medicine

## 2010-12-14 ENCOUNTER — Telehealth: Payer: Self-pay | Admitting: Internal Medicine

## 2010-12-14 NOTE — Telephone Encounter (Signed)
Pt purchased some hemorrhoidal cooling cream to use as a skin barrier while doing her bowel prep. She read that it was advised to contact your doctor if you have high blood pressure and thyroid problems. She has been diagnosed with hypertension and hypothyroidism. Advised that she should use another barrier product such as vaseline instead of hemorrhoidal cooling cream since it has these warnings on the label. She agreed and stated all of her questions were answered. TE

## 2010-12-15 ENCOUNTER — Ambulatory Visit (AMBULATORY_SURGERY_CENTER): Payer: Medicare Other | Admitting: Internal Medicine

## 2010-12-15 ENCOUNTER — Encounter: Payer: Self-pay | Admitting: Internal Medicine

## 2010-12-15 VITALS — BP 168/95 | HR 69 | Temp 98.0°F | Resp 20 | Ht 62.0 in | Wt 179.0 lb

## 2010-12-15 DIAGNOSIS — R195 Other fecal abnormalities: Secondary | ICD-10-CM

## 2010-12-15 DIAGNOSIS — D126 Benign neoplasm of colon, unspecified: Secondary | ICD-10-CM

## 2010-12-15 DIAGNOSIS — Z860100 Personal history of colon polyps, unspecified: Secondary | ICD-10-CM

## 2010-12-15 DIAGNOSIS — K921 Melena: Secondary | ICD-10-CM

## 2010-12-15 HISTORY — PX: COLONOSCOPY: SHX174

## 2010-12-15 HISTORY — DX: Personal history of colon polyps, unspecified: Z86.0100

## 2010-12-15 HISTORY — DX: Personal history of colonic polyps: Z86.010

## 2010-12-15 MED ORDER — SODIUM CHLORIDE 0.9 % IV SOLN: 500.0000 mL | INTRAVENOUS | Status: DC

## 2010-12-15 NOTE — Patient Instructions (Addendum)
Four polyps were removed today. They might have leaked small amounts of blood to cause the blood seen on the stool test. They all look benign. i will send you a letter about the pathology results and when you need another routine colonoscopy.  DO NOT TAKE ASPIRIN OR ANTI-INFLAMMATORY MEDICATIONS LIKE ADVIL OR ALEVE UNTIL December 22, 2010 to reduce risk of bleeding after polyp removal. Iva Boop, MD, [facility]

## 2010-12-15 NOTE — Progress Notes (Signed)
In room pt appears very anxious. Asking if she is going to wake up, saying for [place] to not let her die. Pt encouraged by all staff and md, instructed on safety of procedure and what is going to happen before, during, and after procedure. /ewm  Pt did brady down to 32. Atropine given per m smith crna and md aware of episode and intervention.   Propofol administered by Judie Petit Katrinka Blazing Burt Knack. Please see scanned intra procedure report. EWM

## 2010-12-15 NOTE — Progress Notes (Signed)
Patient very anxious about procedure and IV. Dixie Doss attempted to start an IV: 1) R. Inner Wrist #22. Patient screamed and pulled her arm away. Skin pricked by needle.  2) R. Hand #24. Good flashback, catheter in vein but unable to get fluids to run. 3) R. Inner wrist #24. Good flashback, but vein blew when fluids started. 4) Attempt by Durwin Glaze, L. Forearm, #24 successful.

## 2010-12-16 ENCOUNTER — Telehealth: Payer: Self-pay | Admitting: *Deleted

## 2010-12-16 NOTE — Telephone Encounter (Signed)

## 2010-12-21 NOTE — Progress Notes (Signed)
Quick Note:  Advanced adenomas - repeat colonoscopy 3 years 12/2013 with MAC ______

## 2011-04-04 ENCOUNTER — Other Ambulatory Visit: Payer: Self-pay | Admitting: Physician Assistant

## 2011-04-04 MED ORDER — LEVOTHYROXINE SODIUM 88 MCG PO TABS: 88.0000 ug | ORAL_TABLET | Freq: Every day | ORAL | Status: DC

## 2011-04-24 ENCOUNTER — Other Ambulatory Visit: Payer: Self-pay | Admitting: Family Medicine

## 2011-04-24 MED ORDER — ALENDRONATE SODIUM 70 MG PO TABS: 70.0000 mg | ORAL_TABLET | ORAL | Status: DC

## 2011-05-05 ENCOUNTER — Other Ambulatory Visit: Payer: Self-pay | Admitting: Family Medicine

## 2011-05-05 MED ORDER — LEVOTHYROXINE SODIUM 88 MCG PO TABS: 88.0000 ug | ORAL_TABLET | Freq: Every day | ORAL | Status: DC

## 2011-05-25 ENCOUNTER — Telehealth: Payer: Self-pay

## 2011-05-25 NOTE — Telephone Encounter (Signed)
PT CALLED PHARMACY- [facility].  THEY SENT A fax on 3/19/for a refill for levolthyroxin.  Pt of Dr Hal Hope.  Pharmacy has not heard back for refills.

## 2011-05-25 NOTE — Telephone Encounter (Signed)
lmom to cb. 

## 2011-05-26 NOTE — Telephone Encounter (Signed)
PER LAST 2 REQUESTS SENT BACK TO PHARMACY PT NEEDS OFFICE VISIT

## 2011-05-26 NOTE — Telephone Encounter (Signed)
Pt states she has an appt. scheduled in May for a Physical with Dr. Hal Hope, her RX will run out in a couple of days and would like a refill to get her through until she comes in for the PE. Please call [phone]

## 2011-05-26 NOTE — Telephone Encounter (Signed)
[name] does not have a refill order for Levothyroxine. Pt is requesting a refill.

## 2011-05-27 MED ORDER — LEVOTHYROXINE SODIUM 88 MCG PO TABS: 88.0000 ug | ORAL_TABLET | Freq: Every day | ORAL | Status: DC

## 2011-05-27 NOTE — Telephone Encounter (Signed)
LMOM THAT RX'S WERE SENT IN 

## 2011-05-27 NOTE — Telephone Encounter (Signed)
Sent to pharmacy - please do not miss appt. 

## 2011-05-28 ENCOUNTER — Other Ambulatory Visit: Payer: Self-pay | Admitting: Family Medicine

## 2011-05-31 ENCOUNTER — Other Ambulatory Visit: Payer: Self-pay | Admitting: *Deleted

## 2011-05-31 MED ORDER — BENAZEPRIL-HYDROCHLOROTHIAZIDE 20-12.5 MG PO TABS: 1.0000 | ORAL_TABLET | Freq: Two times a day (BID) | ORAL | Status: DC

## 2011-07-10 ENCOUNTER — Ambulatory Visit (INDEPENDENT_AMBULATORY_CARE_PROVIDER_SITE_OTHER): Payer: Medicare Other | Admitting: Family Medicine

## 2011-07-10 ENCOUNTER — Encounter: Payer: Self-pay | Admitting: Family Medicine

## 2011-07-10 VITALS — BP 170/84 | HR 57 | Temp 97.0°F | Resp 16 | Ht 60.5 in | Wt 183.0 lb

## 2011-07-10 DIAGNOSIS — E785 Hyperlipidemia, unspecified: Secondary | ICD-10-CM

## 2011-07-10 DIAGNOSIS — Z0000 Encounter for general adult medical examination without abnormal findings: Secondary | ICD-10-CM

## 2011-07-10 DIAGNOSIS — H6120 Impacted cerumen, unspecified ear: Secondary | ICD-10-CM

## 2011-07-10 DIAGNOSIS — E039 Hypothyroidism, unspecified: Secondary | ICD-10-CM

## 2011-07-10 DIAGNOSIS — F419 Anxiety disorder, unspecified: Secondary | ICD-10-CM

## 2011-07-10 DIAGNOSIS — I10 Essential (primary) hypertension: Secondary | ICD-10-CM

## 2011-07-10 DIAGNOSIS — L720 Epidermal cyst: Secondary | ICD-10-CM

## 2011-07-10 LAB — LIPID PANEL
Cholesterol: 171 mg/dL (ref 0–200)
HDL: 51 mg/dL (ref >39)
Triglycerides: 120 mg/dL (ref <150)
VLDL: 24 mg/dL (ref 0–40)

## 2011-07-10 LAB — COMPREHENSIVE METABOLIC PANEL
BUN: 21 mg/dL (ref 6–23)
CO2: 29 mEq/L (ref 19–32)
Glucose, Bld: 90 mg/dL (ref 70–99)
Sodium: 141 mEq/L (ref 135–145)
Total Bilirubin: 0.5 mg/dL (ref 0.3–1.2)
Total Protein: 7 g/dL (ref 6.0–8.3)

## 2011-07-10 LAB — CBC WITH DIFFERENTIAL/PLATELET
Eosinophils Absolute: 0.2 10*3/uL (ref 0.0–0.7)
Eosinophils Relative: 3 % (ref 0–5)
HCT: 41.9 % (ref 36.0–46.0)
Hemoglobin: 13.6 g/dL (ref 12.0–15.0)
Lymphs Abs: 2.3 10*3/uL (ref 0.7–4.0)
MCH: 26.8 pg (ref 26.0–34.0)
MCHC: 32.5 g/dL (ref 30.0–36.0)
MCV: 82.5 fL (ref 78.0–100.0)
Monocytes Absolute: 0.5 10*3/uL (ref 0.1–1.0)
Monocytes Relative: 7 % (ref 3–12)
Neutrophils Relative %: 58 % (ref 43–77)
RBC: 5.08 MIL/uL (ref 3.87–5.11)

## 2011-07-10 MED ORDER — SODIUM CHLORIDE 0.9 % IR SOLN
Freq: Once | Status: AC
  Administered 2011-07-10: 11:00:00

## 2011-07-10 NOTE — Progress Notes (Signed)
  Subjective:    Patient ID: [name], female    DOB: [birth year], 66 y.o.   MRN: [id number]  HPI  Patient presents for CPE  See intake form  Severe HTN- did not take diuretic today;followed primarily at [facility]                       Self discontinued Norvasc   HM/ Needs mammogram         Colonoscopy 2012 - adenomatous polyps(Dr. Leone Payor)         DEXA UTD         Pneumovax/ Tdap- current  PMH/ hysteroscopy secondary to postmenopausal vaginal bleeding.  Known fibroids and endometrial polyps           No evidence of endometrial cancer.(Dr. Pennie Rushing) Secondary to stress related to procedure            patient left job.  SH/ Currently unemployed         Writes poetry  Review of Systems     Objective:   Physical Exam  Constitutional: She appears well-developed and well-nourished.  HENT:  Head: Atraumatic.  Nose: Nose normal.  Mouth/Throat: Oropharynx is clear and moist.       Ceruminosis (B)  Eyes: EOM are normal. Pupils are equal, round, and reactive to light.  Neck: Neck supple. No thyromegaly present.  Cardiovascular: Normal rate, regular rhythm and normal heart sounds.   Pulmonary/Chest: Effort normal and breath sounds normal. Right breast exhibits no inverted nipple, no mass, no nipple discharge and no skin change. Left breast exhibits no inverted nipple, no mass, no nipple discharge and no skin change.  Abdominal: Soft. Bowel sounds are normal.       No HSM  Musculoskeletal: Normal range of motion.  Lymphadenopathy:    She has no cervical adenopathy.  Neurological: She is alert. She has normal strength. She displays normal reflexes. No cranial nerve deficit or sensory deficit.  Reflex Scores:      Patellar reflexes are 2+ on the right side and 2+ on the left side. Skin:       Moderate size inclusion cyst (L) nape Seborrhea keratosis (R) forehead  Psychiatric:       anxious          Assessment & Plan:   1. Routine general medical examination at a  health care facility    2. HTN (hypertension)  CBC with Differential, Comprehensive metabolic panel, Lipid panel  3. Ceruminosis  hydrogen peroxide 3 % 10 application in sodium chloride irrigation 0.9 % 500 mL irrigation  4. Hypothyroid  TSH  5. Osteoporosis    6. Inclusion cyst    7. Anxiety      Declines medication for anxiety Continue all current medications Patient to schedule follow up appointment with [facility] in the last month regarding her BP Anticipatory guidance

## 2011-07-12 ENCOUNTER — Encounter: Payer: Self-pay | Admitting: Family Medicine

## 2011-07-22 ENCOUNTER — Other Ambulatory Visit: Payer: Self-pay | Admitting: Family Medicine

## 2011-07-22 ENCOUNTER — Other Ambulatory Visit: Payer: Self-pay | Admitting: Physician Assistant

## 2011-07-23 ENCOUNTER — Other Ambulatory Visit: Payer: Self-pay | Admitting: Physician Assistant

## 2011-07-23 NOTE — Telephone Encounter (Signed)
Recently had CPE, ok to RF x 6 mths?

## 2011-07-24 ENCOUNTER — Other Ambulatory Visit: Payer: Self-pay | Admitting: *Deleted

## 2011-09-21 ENCOUNTER — Ambulatory Visit: Payer: Self-pay | Admitting: Obstetrics and Gynecology

## 2011-10-10 ENCOUNTER — Other Ambulatory Visit: Payer: Self-pay | Admitting: Physician Assistant

## 2011-11-10 ENCOUNTER — Telehealth: Payer: Self-pay

## 2011-11-10 NOTE — Telephone Encounter (Signed)
Not seen since may, in order for Medicare to cover physical therapy patient would need to be seen in past 4 weeks, she is advised and will come in. She is also asking how long she is to take the Fosamax. She has not had a recent bone density scan, when she comes in for her Vertigo she will also ask if she can have a bone density scan.

## 2011-11-10 NOTE — Telephone Encounter (Signed)
[name] pt of Dr. Wendee Copp and would like referral to a Physical Therapist that can help her with Benign Positional Vertigo.  She has heard that physical therapy can help and she will not need to go on medication.  Also would like to speak with clinical staff, she has been on Fossamex for 5 years.  [phone]

## 2012-01-04 ENCOUNTER — Other Ambulatory Visit: Payer: Self-pay | Admitting: Physician Assistant

## 2012-01-07 ENCOUNTER — Other Ambulatory Visit: Payer: Self-pay | Admitting: Physician Assistant

## 2012-01-18 ENCOUNTER — Other Ambulatory Visit: Payer: Self-pay | Admitting: Physician Assistant

## 2012-01-24 ENCOUNTER — Ambulatory Visit (INDEPENDENT_AMBULATORY_CARE_PROVIDER_SITE_OTHER): Payer: Medicare Other | Admitting: Family Medicine

## 2012-01-24 VITALS — BP 138/88 | HR 58 | Temp 98.1°F | Resp 18 | Ht 60.75 in | Wt 178.2 lb

## 2012-01-24 DIAGNOSIS — L0100 Impetigo, unspecified: Secondary | ICD-10-CM

## 2012-01-24 DIAGNOSIS — M949 Disorder of cartilage, unspecified: Secondary | ICD-10-CM

## 2012-01-24 DIAGNOSIS — M8580 Other specified disorders of bone density and structure, unspecified site: Secondary | ICD-10-CM

## 2012-01-24 DIAGNOSIS — H8110 Benign paroxysmal vertigo, unspecified ear: Secondary | ICD-10-CM

## 2012-01-24 MED ORDER — MUPIROCIN CALCIUM 2 % EX CREA: TOPICAL_CREAM | Freq: Three times a day (TID) | CUTANEOUS | Status: DC

## 2012-01-24 NOTE — Progress Notes (Signed)
[facility] [address], [place] [address] [phone]  Date:  01/24/2012   Name:  [name]   DOB:  1946-02-25   MRN:  [id number]  PCP:  Dois Davenport., MD    Chief Complaint: small rash on chin, white head on face and referal for PT for vertigo   History of Present Illness:  [name] is a 66 y.o. very pleasant female patient who presents with the following:  She noted the onset of a "bug bite"  on her left chin just about a week ago. It was slightly swollen  It can itch and sometimes hurt.  It has become larger and crusty in appearance.  She also has a couple of "white heads" that have been persistent on her chin for about a month.  She is not able to get rid of them by squeezing.    She has been on fosamax for about 5 years and would like to stop.  Her last dexa was in 2009  She would also like to see PT regarding her BPPV.  She has had intermittent vertigo off and on for many years.  She occasionally notes that her balance does not seem to be as good as it could be.    Patient Active Problem List  Diagnosis  . Anxiety  . Sleep apnea    Past Medical History  Diagnosis Date  . Environmental allergies   . Anemia   . Anxiety   . Hypertension   . Osteopenia   . Hypothyroidism   . Sleep apnea   . Vitamin D deficiency     history of  . Obesity   . Interstitial cystitis   . H/O varicella   . History of measles, mumps, or rubella   . Fibroid 02/19/03  . Dysuria 2004  . Menopausal symptoms 2004  . Abnormal perimenopausal bleeding 2005  . Endometrial polyp 2006  . Urinary incontinence 2011  . PMB (postmenopausal bleeding) 06/21/10    Past Surgical History  Procedure Date  . Myomectomy 1974  . Hysteroscopy 2006 and May 2012    for fibroids, endometrial polyps  . Mouth surgery   . Colonoscopy 12/15/10    History  Substance Use Topics  . Smoking status: Never Smoker   . Smokeless tobacco: Never Used  . Alcohol Use:  0.6 oz/week    1 Glasses of wine per week     Comment: occasional (1-2 times per week)    Family History  Problem Relation Age of Onset  . Colon cancer Paternal Uncle   . Kidney disease Father     from uremia  . Benign prostatic hyperplasia Father   . Clotting disorder Maternal Grandmother     ????    Allergies  Allergen Reactions  . Bactrim (Sulfamethoxazole W-Trimethoprim)   . Epinephrine     shakey  . Erythromycin   . Sulfa Antibiotics Rash    Medication list has been reviewed and updated.  Current Outpatient Prescriptions on File Prior to Visit  Medication Sig Dispense Refill  . alendronate (FOSAMAX) 70 MG tablet TAKE 1 TABLET BY MOUTH EVERY SEVEN DAYS  12 tablet  0  . Ascorbic Acid (VITAMIN C) 1000 MG tablet Take 1,000 mg by mouth daily.        . benazepril-hydrochlorthiazide (LOTENSIN HCT) 20-12.5 MG per tablet Take 1 tablet by mouth 2 (two) times daily.  60 tablet  0  . carvedilol (COREG) 6.25 MG tablet Take 6.25  mg by mouth 2 (two) times daily with a meal.        . liothyronine (CYTOMEL) 25 MCG tablet TAKE ONE-HALF TABLET BY MOUTH EACH DAY  45 tablet  5  . b complex vitamins capsule Take 1 capsule by mouth daily.        . benazepril-hydrochlorthiazide (LOTENSIN HCT) 20-12.5 MG per tablet Take 1 tablet by mouth 2 (two) times daily. Needs office visit  60 tablet  0  . Cholecalciferol (VITAMIN D) 2000 UNITS tablet Take 2,000 Units by mouth daily.        . hydrALAZINE (APRESOLINE) 25 MG tablet Take 25 mg by mouth. Takes 2 pills every morning, 1 pill at lunch, 2 pills at dinner      . levothyroxine (SYNTHROID, LEVOTHROID) 88 MCG tablet TAKE 1 TABLET BY MOUTH DAILY  90 tablet  1  . LORazepam (ATIVAN) 0.5 MG tablet Take 0.5 mg by mouth as needed.         Review of Systems:  As per HPI- otherwise negative.   Physical Examination: Filed Vitals:   01/24/12 1327  BP: 138/88  Pulse: 58  Temp: 98.1 F (36.7 C)  Resp: 18   Filed Vitals:   01/24/12 1327  Height: 5'  0.75" (1.543 m)  Weight: 178 lb 3.2 oz (80.831 kg)   Body mass index is 33.95 kg/(m^2). Ideal Body Weight: Weight in (lb) to have BMI = 25: 131   GEN: WDWN, NAD, Non-toxic, A & O x 3 HEENT: Atraumatic, Normocephalic. Neck supple. No masses, No LAD. Ears and Nose: No external deformity. CV: RRR, No M/G/R. No JVD. No thrill. No extra heart sounds. PULM: CTA B, no wheezes, crackles, rhonchi. No retractions. No resp. distress. No accessory muscle use. ABD: S, NT, ND, +BS. No rebound. No HSM. EXTR: No c/c/e NEURO Normal gait.  PSYCH: Normally interactive. Conversant. Not depressed or anxious appearing.  Calm demeanor.  There is a nickel sized crusted lesion on the left side of her chin.   Her right chin shows two milia.  VC obtained.  Cleaned area with an alcohol swab and used a needle to prick skin over the milia.  Material expressed.    Assessment and Plan: 1. Impetigo  mupirocin cream (BACTROBAN) 2 %  2. BPPV (benign paroxysmal positional vertigo)  Ambulatory referral to Physical Therapy  3. Osteopenia  HM DEXA SCAN   Treat for impetigo as above.  Let me know if not improved in 3 or 4 days- Sooner if worse.   Referral for PT for her BPPV, and for a dexa scan for a baseline.  She may go ahead and stop her fosamax. We will plan to re- dexa scan in 12- 18 months once she is off the fosamax .   Removed milia as above.    Abbe Amsterdam, MD

## 2012-01-31 ENCOUNTER — Telehealth: Payer: Self-pay

## 2012-01-31 NOTE — Telephone Encounter (Signed)
PT STATES SHE WAS TO CALL BACK IF HER COLD SORES WEREN'T BETTER, THEY ARE BETTER BUT NOT GONE COMPLETELY PLEASE CALL [phone]   [facility] [address]

## 2012-01-31 NOTE — Telephone Encounter (Signed)
Called her back- she is somewhat better, does not want to try valtrex because she is afraid she might be allergic to it.  She will let me know if not continuing to get better

## 2012-02-04 DIAGNOSIS — M8580 Other specified disorders of bone density and structure, unspecified site: Secondary | ICD-10-CM

## 2012-02-04 HISTORY — DX: Other specified disorders of bone density and structure, unspecified site: M85.80

## 2012-02-07 ENCOUNTER — Telehealth: Payer: Self-pay

## 2012-02-07 NOTE — Telephone Encounter (Signed)
Pt states her cold sore has not gone yet, would like a new medication  From Dr. Kathreen Cornfield on [address]  [phone]

## 2012-02-07 NOTE — Telephone Encounter (Signed)
A cold sore should have healed by now- there may be another cause.  Please ask her to come in for a recheck Thanks! JC

## 2012-02-08 NOTE — Telephone Encounter (Signed)
I called her, she will come in tomorrow to see you for this.

## 2012-02-09 ENCOUNTER — Ambulatory Visit (INDEPENDENT_AMBULATORY_CARE_PROVIDER_SITE_OTHER): Payer: Medicare Other | Admitting: Family Medicine

## 2012-02-09 VITALS — BP 136/84 | HR 80 | Temp 98.0°F | Resp 16 | Ht 61.0 in | Wt 181.0 lb

## 2012-02-09 DIAGNOSIS — R21 Rash and other nonspecific skin eruption: Secondary | ICD-10-CM

## 2012-02-09 DIAGNOSIS — M810 Age-related osteoporosis without current pathological fracture: Secondary | ICD-10-CM

## 2012-02-09 MED ORDER — METRONIDAZOLE 1 % EX GEL: Freq: Every day | CUTANEOUS | Status: DC

## 2012-02-09 NOTE — Progress Notes (Signed)
[facility] [address] [place] [address] [phone]  Date:  02/09/2012   Name:  [name]   DOB:  30-Jul-1945   MRN:  [id number]  PCP:  Dois Davenport., MD    Chief Complaint: Follow-up   History of Present Illness:  [name] is a 66 y.o. very pleasant female patient who presents with the following:  She tried bactroban cream at her last visit- the area is much better but not yet 100%.  She also noted a few more milia on her chin as well.  She was here on 11/20 and noted to have a crusty lesion on the left side of he chin- see OV note from 01/24/12.  She is bothered because the lesion is still visible.  However, it does not itch or hurt.  Otherwise she feels well today.   She also did not hear about her Dexa scan yet.  She has stopped her fosamax and we wanted to get a baseline dexa.   Patient Active Problem List  Diagnosis  . Anxiety  . Sleep apnea    Past Medical History  Diagnosis Date  . Environmental allergies   . Anemia   . Anxiety   . Hypertension   . Osteopenia   . Hypothyroidism   . Sleep apnea   . Vitamin D deficiency     history of  . Obesity   . Interstitial cystitis   . H/O varicella   . History of measles, mumps, or rubella   . Fibroid 02/19/03  . Dysuria 2004  . Menopausal symptoms 2004  . Abnormal perimenopausal bleeding 2005  . Endometrial polyp 2006  . Urinary incontinence 2011  . PMB (postmenopausal bleeding) 06/21/10    Past Surgical History  Procedure Date  . Myomectomy 1974  . Hysteroscopy 2006 and May 2012    for fibroids, endometrial polyps  . Mouth surgery   . Colonoscopy 12/15/10    History  Substance Use Topics  . Smoking status: Never Smoker   . Smokeless tobacco: Never Used  . Alcohol Use: 0.6 oz/week    1 Glasses of wine per week     Comment: occasional (1-2 times per week)    Family History  Problem Relation Age of Onset  . Colon cancer Paternal Uncle   . Kidney  disease Father     from uremia  . Benign prostatic hyperplasia Father   . Clotting disorder Maternal Grandmother     ????    Allergies  Allergen Reactions  . Bactrim (Sulfamethoxazole W-Trimethoprim)   . Epinephrine     shakey  . Erythromycin   . Sulfa Antibiotics Rash    Medication list has been reviewed and updated.  Current Outpatient Prescriptions on File Prior to Visit  Medication Sig Dispense Refill  . Ascorbic Acid (VITAMIN C) 1000 MG tablet Take 1,000 mg by mouth daily.        . benazepril-hydrochlorthiazide (LOTENSIN HCT) 20-12.5 MG per tablet Take 1 tablet by mouth 2 (two) times daily.  60 tablet  0  . benazepril-hydrochlorthiazide (LOTENSIN HCT) 20-12.5 MG per tablet Take 1 tablet by mouth 2 (two) times daily. Needs office visit  60 tablet  0  . carvedilol (COREG) 6.25 MG tablet Take 6.25 mg by mouth 2 (two) times daily with a meal.        . Cholecalciferol (VITAMIN D) 2000 UNITS tablet Take 2,000 Units by mouth daily.        Marland Kitchen  hydrALAZINE (APRESOLINE) 25 MG tablet Take 25 mg by mouth. Takes 2 pills every morning, 1 pill at lunch, 2 pills at dinner      . levothyroxine (SYNTHROID, LEVOTHROID) 88 MCG tablet TAKE 1 TABLET BY MOUTH DAILY  90 tablet  1  . liothyronine (CYTOMEL) 25 MCG tablet TAKE ONE-HALF TABLET BY MOUTH EACH DAY  45 tablet  5  . mupirocin cream (BACTROBAN) 2 % Apply topically 3 (three) times daily.  15 g  0  . alendronate (FOSAMAX) 70 MG tablet TAKE 1 TABLET BY MOUTH EVERY SEVEN DAYS  12 tablet  0  . b complex vitamins capsule Take 1 capsule by mouth daily.        Marland Kitchen LORazepam (ATIVAN) 0.5 MG tablet Take 0.5 mg by mouth as needed.         Review of Systems:  As per HPI- otherwise negative.   Physical Examination: Filed Vitals:   02/09/12 1151  BP: 170/85  Pulse: 80  Temp: 98 F (36.7 C)  Resp: 16   Filed Vitals:   02/09/12 1151  Height: 5\' 1"  (1.549 m)  Weight: 181 lb (82.101 kg)   Body mass index is 34.20 kg/(m^2). Ideal Body Weight:  Weight in (lb) to have BMI = 25: 132    GEN: WDWN, NAD, Non-toxic, Alert & Oriented x 3, obese HEENT: Atraumatic, Normocephalic.  Ears and Nose: No external deformity. EXTR: No clubbing/cyanosis/edema NEURO: Normal gait.  PSYCH: Normally interactive. Conversant. Not depressed or anxious appearing.  Calm demeanor.  Face; she has a minimal irritated are on the left side of her chin- it looks a lot better than it had. No longer crusted.  She has 3 milia on her chin and left cheek  VC obtained.  Cleaned skin with alcohol and then removed milia with a needle.   Assessment and Plan: 1. Facial rash  metroNIDAZOLE (METROGEL) 1 % gel  2. Osteoporosis  DG Bone Density   Persistent peri- oral dermatitis after impetigo.  Will try metrogel topically.  Redid referral for dexa scan.  She will let me know if her facial rash does not get better   Abbe Amsterdam, MD

## 2012-02-12 ENCOUNTER — Other Ambulatory Visit (HOSPITAL_COMMUNITY): Payer: Self-pay | Admitting: *Deleted

## 2012-02-12 ENCOUNTER — Other Ambulatory Visit: Payer: Self-pay | Admitting: Family Medicine

## 2012-02-12 DIAGNOSIS — Z1231 Encounter for screening mammogram for malignant neoplasm of breast: Secondary | ICD-10-CM

## 2012-02-13 ENCOUNTER — Ambulatory Visit: Payer: Medicare Other | Attending: Family Medicine | Admitting: Physical Therapy

## 2012-02-13 DIAGNOSIS — IMO0001 Reserved for inherently not codable concepts without codable children: Secondary | ICD-10-CM | POA: Insufficient documentation

## 2012-02-13 DIAGNOSIS — R42 Dizziness and giddiness: Secondary | ICD-10-CM | POA: Insufficient documentation

## 2012-02-21 ENCOUNTER — Ambulatory Visit: Payer: Medicare Other | Admitting: Physical Therapy

## 2012-03-11 ENCOUNTER — Other Ambulatory Visit: Payer: Self-pay | Admitting: Physician Assistant

## 2012-03-18 ENCOUNTER — Ambulatory Visit (HOSPITAL_COMMUNITY)
Admission: RE | Admit: 2012-03-18 | Discharge: 2012-03-18 | Disposition: A | Discharge status: Home / Self Care | Payer: Medicare HMO | Source: Ambulatory Visit | Attending: Family Medicine | Admitting: Family Medicine

## 2012-03-18 DIAGNOSIS — Z853 Personal history of malignant neoplasm of breast: Secondary | ICD-10-CM | POA: Insufficient documentation

## 2012-03-18 DIAGNOSIS — M810 Age-related osteoporosis without current pathological fracture: Secondary | ICD-10-CM | POA: Insufficient documentation

## 2012-03-18 DIAGNOSIS — Z1231 Encounter for screening mammogram for malignant neoplasm of breast: Secondary | ICD-10-CM

## 2012-03-21 ENCOUNTER — Other Ambulatory Visit: Payer: Self-pay | Admitting: Family Medicine

## 2012-03-21 DIAGNOSIS — R928 Other abnormal and inconclusive findings on diagnostic imaging of breast: Secondary | ICD-10-CM

## 2012-03-22 ENCOUNTER — Other Ambulatory Visit: Payer: Self-pay | Admitting: Physician Assistant

## 2012-04-01 ENCOUNTER — Telehealth: Payer: Self-pay | Admitting: Family Medicine

## 2012-04-02 ENCOUNTER — Ambulatory Visit
Admission: RE | Admit: 2012-04-02 | Discharge: 2012-04-02 | Disposition: A | Discharge status: Home / Self Care | Payer: Medicare HMO | Source: Ambulatory Visit | Attending: Family Medicine | Admitting: Family Medicine

## 2012-04-02 ENCOUNTER — Other Ambulatory Visit: Payer: Self-pay | Admitting: Family Medicine

## 2012-04-02 DIAGNOSIS — R928 Other abnormal and inconclusive findings on diagnostic imaging of breast: Secondary | ICD-10-CM

## 2012-04-05 ENCOUNTER — Other Ambulatory Visit: Payer: Medicare HMO

## 2012-04-05 ENCOUNTER — Telehealth: Payer: Self-pay

## 2012-04-05 DIAGNOSIS — M8580 Other specified disorders of bone density and structure, unspecified site: Secondary | ICD-10-CM

## 2012-04-05 DIAGNOSIS — R928 Other abnormal and inconclusive findings on diagnostic imaging of breast: Secondary | ICD-10-CM

## 2012-04-05 NOTE — Telephone Encounter (Signed)
PT STATES SHE WENT TO THE [facility] ON [address] AND HAD A MAMMOGRAM DONE AND WAS TOLD SHE NEEDED TO HAVE A BIOPSY DONE, BUT WOULD LIKE A REFERRAL  FOR A SECOND OPINION AND DO NOT WANT TO GO BACK THERE. PLEASE CALL [phone]

## 2012-04-05 NOTE — Telephone Encounter (Signed)
Called and discussed with her.  She is concerned about having an unnecessary procedure and would like to have a second opinion regarding her mammogram.  Will refer her to [facility] for an evaluation, and she will pick up her films from the breast center.    Also went over her recent Dexa scan.  She is osteopenic in her femur.  However, she recently came off fosamax and does not wish to restart due to her concerns about long- term safety.  Encouraged weight bearing exercise and went over calcium and vitamin D recommendations.  She will plan to repeat he dexa in about one year

## 2012-04-05 NOTE — Telephone Encounter (Signed)
Did not reach pt.

## 2012-04-08 ENCOUNTER — Telehealth: Payer: Self-pay

## 2012-04-08 DIAGNOSIS — R928 Other abnormal and inconclusive findings on diagnostic imaging of breast: Secondary | ICD-10-CM

## 2012-04-08 NOTE — Telephone Encounter (Signed)
Pt is wanting to talk with someone about a second opinion on a biopsy she is scheduled to have on Thursday  Best number [phone]

## 2012-04-09 NOTE — Telephone Encounter (Signed)
That sounds fine.  If she wants to call around and see if someone will give her a 2nd opinion that is ok too.

## 2012-04-09 NOTE — Telephone Encounter (Signed)
She was to have 2nd opinion at [facility], but they do not do 2nd opinions. I think the surgeon should see her to discuss the risks vs benefits of having a biopsy done, I have put in this order, please let me know if this is okay. I have left message for patient to call me back.

## 2012-04-09 NOTE — Telephone Encounter (Signed)
That sounds fine- if she wants to call around herself in search of someone to give a second opinion that is ok too, but I would recommend that she meet with the surgeon and talk with them first to perhaps quell her fears

## 2012-04-09 NOTE — Telephone Encounter (Signed)
Thanks, Lupita Leash will check on this.

## 2012-04-11 ENCOUNTER — Encounter (INDEPENDENT_AMBULATORY_CARE_PROVIDER_SITE_OTHER): Payer: Self-pay | Admitting: General Surgery

## 2012-04-11 ENCOUNTER — Ambulatory Visit (INDEPENDENT_AMBULATORY_CARE_PROVIDER_SITE_OTHER): Payer: Medicare HMO | Admitting: General Surgery

## 2012-04-11 ENCOUNTER — Other Ambulatory Visit: Payer: Medicare HMO

## 2012-04-11 VITALS — BP 138/80 | HR 80 | Temp 97.2°F | Resp 16 | Ht 61.5 in | Wt 181.6 lb

## 2012-04-11 DIAGNOSIS — R928 Other abnormal and inconclusive findings on diagnostic imaging of breast: Secondary | ICD-10-CM

## 2012-04-11 NOTE — Progress Notes (Signed)
Patient ID: [name], female   DOB: 04-Oct-1945, 67 y.o.   MRN: [id number]  Chief Complaint  Patient presents with  . New Evaluation    eval rt breast mass    HPI [name] is a 67 y.o. female.   HPI This patient presents for evaluation of a possible right breast mass found on annual screening mammogram. She was called back for a second look mammogram and spot compression views confirmed the persistence of this lesion. Followup ultrasound as well demonstrated a 16 mm hypoechoic mass in the 2:00 position of the right breast. She says that she has not been doing her self breast exams routinely but since then has done her breast exam and she cannot feel any suspicious lesions. She does get her and her mammograms although it has been 2 years since her last images. She denies any history of breast cancer in her mother or sister and otherwise is unsure of her distant family history. She is not taking any hormones and denies any systemic symptoms such as abnormal weight loss, bony pains, or headaches. Past Medical History  Diagnosis Date  . Environmental allergies   . Anemia   . Anxiety   . Hypertension   . Osteopenia   . Hypothyroidism   . Sleep apnea   . Vitamin D deficiency     history of  . Obesity   . Interstitial cystitis   . H/O varicella   . History of measles, mumps, or rubella   . Fibroid 02/19/03  . Dysuria 2004  . Menopausal symptoms 2004  . Abnormal perimenopausal bleeding 2005  . Endometrial polyp 2006  . Urinary incontinence 2011  . PMB (postmenopausal bleeding) 06/21/10    Past Surgical History  Procedure Date  . Myomectomy 1974  . Hysteroscopy 2006 and May 2012    for fibroids, endometrial polyps  . Mouth surgery   . Colonoscopy 12/15/10    Family History  Problem Relation Age of Onset  . Colon cancer Paternal Uncle   . Kidney disease Father     from uremia  . Benign prostatic hyperplasia Father   . Clotting disorder Maternal Grandmother      ????    Social History History  Substance Use Topics  . Smoking status: Never Smoker   . Smokeless tobacco: Never Used  . Alcohol Use: 0.6 oz/week    1 Glasses of wine per week     Comment: occasional (1-2 times per week)    Allergies  Allergen Reactions  . Bactrim (Sulfamethoxazole W-Trimethoprim)   . Epinephrine     shakey  . Erythromycin   . Sulfa Antibiotics Rash    Current Outpatient Prescriptions  Medication Sig Dispense Refill  . Ascorbic Acid (VITAMIN C) 1000 MG tablet Take 1,000 mg by mouth daily.        . benazepril-hydrochlorthiazide (LOTENSIN HCT) 20-12.5 MG per tablet Take 1 tablet by mouth 2 (two) times daily.  60 tablet  0  . carvedilol (COREG) 6.25 MG tablet Take 6.25 mg by mouth 2 (two) times daily with a meal.        . Cholecalciferol (VITAMIN D) 2000 UNITS tablet Take 2,000 Units by mouth daily.        . hydrALAZINE (APRESOLINE) 25 MG tablet Take 25 mg by mouth. Takes 2 pills every morning, 1 pill at lunch, 2 pills at dinner      . levothyroxine (SYNTHROID, LEVOTHROID) 88 MCG tablet TAKE 1 TABLET BY MOUTH DAILY  90  tablet  0  . liothyronine (CYTOMEL) 25 MCG tablet TAKE ONE-HALF TABLET BY MOUTH EACH DAY  45 tablet  5    Review of Systems Review of Systems All other review of systems negative or noncontributory except as stated in the HPI  Blood pressure 138/80, pulse 80, temperature 97.2 F (36.2 C), temperature source Temporal, resp. rate 16, height 5' 1.5" (1.562 m), weight 181 lb 9.6 oz (82.373 kg).  Physical Exam Physical Exam Physical Exam  Nursing note and vitals reviewed. Constitutional: She is oriented to person, place, and time. She appears well-developed and well-nourished. No distress.  HENT:  Head: Normocephalic and atraumatic.  Mouth/Throat: No oropharyngeal exudate.  Eyes: Conjunctivae and EOM are normal. Pupils are equal, round, and reactive to light. Right eye exhibits no discharge. Left eye exhibits no discharge. No scleral icterus.   Neck: Normal range of motion. Neck supple. No tracheal deviation present.  Cardiovascular: Normal rate, regular rhythm, normal heart sounds and intact distal pulses.   Pulmonary/Chest: Effort normal and breath sounds normal. No stridor. No respiratory distress. She has no wheezes.  Abdominal: Soft. Bowel sounds are normal. She exhibits no distension and no mass. There is no tenderness. There is no rebound and no guarding.  Musculoskeletal: Normal range of motion. She exhibits no edema and no tenderness.  Neurological: She is alert and oriented to person, place, and time.  Skin: Skin is warm and dry. No rash noted. She is not diaphoretic. No erythema. No pallor.  Psychiatric: She has a normal mood and affect. Her behavior is normal. Judgment and thought content normal.  Breasts: right breast is normal to exam without any suspicious masses, skin lesions or LAD.  Left breast is normal to exam without any suspicious masses, skin lesions or LAD  Data Reviewed   Assessment    Abnormal mammogram She has an abnormal mammogram with a suspicious lesion on the right.  I do not appreciate suspicious lesions on exam and she has no significant risk factors or family history, however, given her abnormal mammogram and ultrasound, I would also recommend corneal biopsy of the mass. We discussed the options for continued surveillance with imaging versus core biopsy versus surgical biopsy. I will review the images at the breast center and plan on recommending core needle biopsy.  I have reviewed the images personally and with Dr. Judyann Munson and I called the patient to say that I did agree with the radiologist that she should go ahead and proceed with needle biopsy and she should come back to discuss the results after her biopsy.    Plan    Core needle biopsy of right breast lesion and followup after biopsy results to discuss results         Maydelin Deming DAVID 04/11/2012, 11:26 AM

## 2012-04-17 ENCOUNTER — Other Ambulatory Visit: Payer: Medicare HMO

## 2012-04-19 ENCOUNTER — Other Ambulatory Visit: Payer: Medicare HMO

## 2012-04-21 ENCOUNTER — Ambulatory Visit (INDEPENDENT_AMBULATORY_CARE_PROVIDER_SITE_OTHER): Payer: Medicare PPO | Admitting: Family Medicine

## 2012-04-21 VITALS — BP 146/80 | HR 56 | Temp 98.3°F | Resp 18 | Ht 60.0 in | Wt 179.6 lb

## 2012-04-21 DIAGNOSIS — M25569 Pain in unspecified knee: Secondary | ICD-10-CM

## 2012-04-21 DIAGNOSIS — M1711 Unilateral primary osteoarthritis, right knee: Secondary | ICD-10-CM

## 2012-04-21 DIAGNOSIS — M1710 Unilateral primary osteoarthritis, unspecified knee: Secondary | ICD-10-CM

## 2012-04-21 MED ORDER — MELOXICAM 7.5 MG PO TABS: 7.5000 mg | ORAL_TABLET | Freq: Every day | ORAL | Status: DC

## 2012-04-21 NOTE — Progress Notes (Signed)
  Subjective:    Patient ID: [name], female    DOB: Jun 05, 1945, 67 y.o.   MRN: [id number]  HPI [name] is a 67 y.o. female  bilaterral knee - R more than L past week.  Took few doses of advil  - did get relief, but did not want to take too long.  Nki, but was sitting for awhile -with laptop on knees. May have hx of OA. Tried knee sleeve.  Feels swollen?  Did try chair yoga  - no improvement.   No fever, dyspnea, no chest pain. No hx of DVT.   Review of Systems No f/c/rash/calf swelling or pain.  No chest pain/dyspnea. Able to ambulate but sore in knees at times with this.     Objective:   Physical Exam  Vitals reviewed. Constitutional: She is oriented to person, place, and time. She appears well-developed and well-nourished. No distress.  Pulmonary/Chest: Effort normal.  Musculoskeletal:       Right knee: She exhibits normal range of motion, no swelling (no apprent focal swelling or effusion. ), no ecchymosis, no deformity, no LCL laxity, no bony tenderness, normal meniscus and no MCL laxity. Tenderness found. Medial joint line and lateral joint line tenderness noted. No MCL, no LCL and no patellar tendon tenderness noted.       Left knee: She exhibits normal range of motion, no swelling, no deformity, no LCL laxity, normal meniscus and no MCL laxity. Tenderness found. Medial joint line (minimal) and lateral joint line (minimal) tenderness noted.  Neurological: She is alert and oriented to person, place, and time.  Skin: Skin is warm and dry. No rash noted. She is not diaphoretic. No erythema.  Psychiatric: She has a normal mood and affect. Her behavior is normal.       Assessment & Plan:  [name] is a 67 y.o. female Knee pain - Plan: meloxicam (MOBIC) 7.5 MG tablet, DISCONTINUED: meloxicam (MOBIC) 7.5 MG tablet  Osteoarthritis of right knee - Plan: meloxicam (MOBIC) 7.5 MG tablet, DISCONTINUED: meloxicam (MOBIC) 7.5 MG tablet  R greater than L knee  pain - suspected flair of OA. Discussed XR, but as no recent known injury - deferred today. Discussed tylenol initially, of if needed - advil OR mobic trial, ROM and HEP  (printout of OA from sports med database) and recheck in next week if not improving. RTC precautions.   Patient Instructions  Work on range of motion, leg lifts and heat or ice to sore areas as needed. Start with tylenol over the counter.  If needed you can take the meloxicam once per day OR ibuprofen, but do not combine these two medicines.  Keep a record of your blood pressures outside of the office and if elevated - stop the meloxicam and recheck in office.  Recheck in next week if not improving. Return to the clinic or go to the nearest emergency room if any of your symptoms worsen or new symptoms occur.

## 2012-04-21 NOTE — Patient Instructions (Signed)
Work on range of motion, leg lifts and heat or ice to sore areas as needed. Start with tylenol over the counter.  If needed you can take the meloxicam once per day OR ibuprofen, but do not combine these two medicines.  Keep a record of your blood pressures outside of the office and if elevated - stop the meloxicam and recheck in office.  Recheck in next week if not improving. Return to the clinic or go to the nearest emergency room if any of your symptoms worsen or new symptoms occur.

## 2012-04-22 ENCOUNTER — Telehealth: Payer: Self-pay | Admitting: Radiology

## 2012-04-22 DIAGNOSIS — M1711 Unilateral primary osteoarthritis, right knee: Secondary | ICD-10-CM

## 2012-04-22 DIAGNOSIS — M25569 Pain in unspecified knee: Secondary | ICD-10-CM

## 2012-04-22 MED ORDER — MELOXICAM 7.5 MG PO TABS: 7.5000 mg | ORAL_TABLET | Freq: Every day | ORAL | Status: DC

## 2012-04-22 NOTE — Telephone Encounter (Signed)
[facility] called about her Rx, advised was sent to [facility]. Have sent to [facility].

## 2012-04-25 ENCOUNTER — Other Ambulatory Visit: Payer: Self-pay | Admitting: Family Medicine

## 2012-04-25 ENCOUNTER — Ambulatory Visit
Admission: RE | Admit: 2012-04-25 | Discharge: 2012-04-25 | Disposition: A | Discharge status: Home / Self Care | Payer: Medicare HMO | Source: Ambulatory Visit | Attending: Family Medicine | Admitting: Family Medicine

## 2012-04-25 DIAGNOSIS — R928 Other abnormal and inconclusive findings on diagnostic imaging of breast: Secondary | ICD-10-CM

## 2012-04-28 ENCOUNTER — Ambulatory Visit (INDEPENDENT_AMBULATORY_CARE_PROVIDER_SITE_OTHER): Payer: Medicare PPO | Admitting: Family Medicine

## 2012-04-28 VITALS — BP 177/78 | HR 67 | Temp 98.3°F | Resp 17 | Ht 61.0 in | Wt 181.0 lb

## 2012-04-28 DIAGNOSIS — E039 Hypothyroidism, unspecified: Secondary | ICD-10-CM | POA: Insufficient documentation

## 2012-04-28 DIAGNOSIS — R300 Dysuria: Secondary | ICD-10-CM

## 2012-04-28 DIAGNOSIS — M1712 Unilateral primary osteoarthritis, left knee: Secondary | ICD-10-CM

## 2012-04-28 DIAGNOSIS — M1710 Unilateral primary osteoarthritis, unspecified knee: Secondary | ICD-10-CM

## 2012-04-28 DIAGNOSIS — I10 Essential (primary) hypertension: Secondary | ICD-10-CM

## 2012-04-28 LAB — POCT URINALYSIS DIPSTICK
Blood, UA: NEGATIVE
Glucose, UA: NEGATIVE
Spec Grav, UA: <=1.005
Urobilinogen, UA: 0.2
pH, UA: 6.5

## 2012-04-28 LAB — POCT UA - MICROSCOPIC ONLY
Crystals, Ur, HPF, POC: NEGATIVE
RBC, urine, microscopic: NEGATIVE

## 2012-04-28 MED ORDER — PHENAZOPYRIDINE HCL 200 MG PO TABS: 200.0000 mg | ORAL_TABLET | Freq: Three times a day (TID) | ORAL | Status: AC | PRN

## 2012-04-28 MED ORDER — BENAZEPRIL-HYDROCHLOROTHIAZIDE 20-12.5 MG PO TABS: 2.0000 | ORAL_TABLET | Freq: Every day | ORAL | Status: DC

## 2012-04-28 MED ORDER — CARVEDILOL 6.25 MG PO TABS: 6.2500 mg | ORAL_TABLET | Freq: Two times a day (BID) | ORAL | Status: DC

## 2012-04-28 MED ORDER — LEVOTHYROXINE SODIUM 88 MCG PO TABS: ORAL_TABLET | ORAL | Status: DC

## 2012-04-28 MED ORDER — HYDRALAZINE HCL 25 MG PO TABS: ORAL_TABLET | ORAL | Status: DC

## 2012-04-28 NOTE — Patient Instructions (Addendum)
Wear and Tear Disorders of the Knee (Arthritis, Osteoarthritis)  Everyone will experience wear and tear injuries (arthritis, osteoarthritis) of the knee. These are the changes we all get as we age. They come from the joint stress of daily living. The amount of cartilage damage in your knee and your symptoms determine if you need surgery. Mild problems require approximately two months recovery time. More severe problems take several months to recover. With mild problems, your surgeon may find worn and rough cartilage surfaces. With severe changes, your surgeon may find cartilage that has completely worn away and exposed the bone. Loose bodies of bone and cartilage, bone spurs (excess bone growth), and injuries to the menisci (cushions between the large bones of your leg) are also common. All of these problems can cause pain.  For a mild wear and tear problem, rough cartilage may simply need to be shaved and smoothed. For more severe problems with areas of exposed bone, your surgeon may use an instrument for roughing up the bone surfaces to stimulate new cartilage growth. Loose bodies are usually removed. Torn menisci may be trimmed or repaired.  ABOUT THE ARTHROSCOPIC PROCEDURE  Arthroscopy is a surgical technique. It allows your orthopedic surgeon to diagnose and treat your knee injury with accuracy. The surgeon looks into your knee through a small scope. The scope is like a small (pencil-sized) telescope. Arthroscopy is less invasive than open knee surgery. You can expect a more rapid recovery. After the procedure, you will be moved to a recovery area until most of the effects of the medication have worn off. Your caregiver will discuss the test results with you.  RECOVERY  The severity of the arthritis and the type of procedure performed will determine recovery time. Other important factors include age, physical condition, medical conditions, and the type of rehabilitation program. Strengthening your muscles after  arthroscopy helps guarantee a better recovery. Follow your caregiver's instructions. Use crutches, rest, elevate, ice, and do knee exercises as instructed. Your caregivers will help you and instruct you with exercises and other physical therapy required to regain your mobility, muscle strength, and functioning following surgery. Only take over-the-counter or prescription medicines for pain, discomfort, or fever as directed by your caregiver.   SEEK MEDICAL CARE IF:    There is increased bleeding (more than a small spot) from the wound.   You notice redness, swelling, or increasing pain in the wound.   Pus is coming from wound.   You develop an unexplained oral temperature above 102 F (38.9 C) , or as your caregiver suggests.   You notice a foul smell coming from the wound or dressing.   You have severe pain with motion of the knee.  SEEK IMMEDIATE MEDICAL CARE IF:    You develop a rash.   You have difficulty breathing.   You have any allergic problems.  MAKE SURE YOU:    Understand these instructions.   Will watch your condition.   Will get help right away if you are not doing well or get worse.  Document Released: 02/18/2000 Document Revised: 05/15/2011 Document Reviewed: 07/17/2007  [employer] Patient Information 2013 [employer]

## 2012-04-28 NOTE — Progress Notes (Signed)
Subjective:    Patient ID: [name], female    DOB: January 02, 1946, 67 y.o.   MRN: [id number] Chief Complaint  Patient presents with  . Urinary Tract Infection    sample taken by jenna   . Knee Pain    2 weeks    HPI  Meloxicam was making bp go up so she had to stop it after just a few days - BP went up to 177/88.  Using tylenol 2 tabs every 6 hours for her right knee pain and stopped that a few days ago.  Thinks chair yoga was helping.  Still a little stiff but no more sharp shooting pain.  Pain is dull and improves with walking.  No redness, swelling, warmth - feels some twinges but improved.  Forgot to take diuretic yest which is why she thinks her BP is up.  Occ checks BP outside of office and usu 140s-150s/70s.  Is seeing Dr. Herbie Baltimore at [facility] who is managing her BP - only sees him once a yr.  States she requested refills on her BP meds from [place] because it is easier to get through and get them.  Feels like she has had bladder infection for a wk - burning on and off and suprapubic pressure, went away with cranberry juice but coming back. Has not had UTI for yrs. No baths, swimming, hot tubs, not currently sexually active. No n/v, no flank pains, some chills but no fever. Occ constipaiton - tries to eat more veggies when this happens.  IC was a long time ago - over 20-30 yrs ago and no prob with it since.  Breast biopsy the other day - none cancerous.   Gets physical every year in May when she gets her thyroid meds renewed. Does not want to do any labs till then.  Hysteroscopy in 2012 and told she didn't need to worry about cancer for at least 2 yrs - followed by Dr. Dierdre Forth.  She still has occ vaginal bleeding. Does not have any vaginal discharge.  No itching or pain externally.  Past Medical History  Diagnosis Date  . Environmental allergies   . Anemia   . Anxiety   . Hypertension   . Osteopenia   . Hypothyroidism   . Sleep apnea   . Vitamin D  deficiency     history of  . Obesity   . Interstitial cystitis   . H/O varicella   . History of measles, mumps, or rubella   . Fibroid 02/19/03  . Dysuria 2004  . Menopausal symptoms 2004  . Abnormal perimenopausal bleeding 2005  . Endometrial polyp 2006  . Urinary incontinence 2011  . PMB (postmenopausal bleeding) 06/21/10   Current Outpatient Prescriptions on File Prior to Visit  Medication Sig Dispense Refill  . Ascorbic Acid (VITAMIN C) 1000 MG tablet Take 1,000 mg by mouth daily.        . Cholecalciferol (VITAMIN D) 2000 UNITS tablet Take 2,000 Units by mouth daily.        Marland Kitchen liothyronine (CYTOMEL) 25 MCG tablet TAKE ONE-HALF TABLET BY MOUTH EACH DAY  45 tablet  5   No current facility-administered medications on file prior to visit.   Allergies  Allergen Reactions  . Bactrim (Sulfamethoxazole W-Trimethoprim)   . Epinephrine     shakey  . Erythromycin   . Sulfa Antibiotics Rash     Review of Systems  Constitutional: Positive for chills. Negative for fever, diaphoresis, activity change, appetite change, fatigue and  unexpected weight change.  Gastrointestinal: Positive for constipation. Negative for nausea, vomiting, abdominal pain, diarrhea, blood in stool, anal bleeding and rectal pain.  Genitourinary: Positive for dysuria, urgency, vaginal bleeding, menstrual problem and pelvic pain. Negative for frequency, hematuria, flank pain, decreased urine volume, vaginal discharge, enuresis, difficulty urinating, genital sores, vaginal pain and dyspareunia.  Musculoskeletal: Positive for arthralgias. Negative for joint swelling and gait problem.  Skin: Negative for color change and rash.  Hematological: Negative for adenopathy.  Psychiatric/Behavioral: The patient is not nervous/anxious.       BP 177/78  Pulse 67  Temp(Src) 98.3 F (36.8 C) (Oral)  Resp 17  Ht 5\' 1"  (1.549 m)  Wt 181 lb (82.101 kg)  BMI 34.22 kg/m2  SpO2 99% Objective:   Physical Exam  Constitutional:  She is oriented to person, place, and time. She appears well-developed and well-nourished. No distress.  HENT:  Head: Normocephalic and atraumatic.  Right Ear: External ear normal.  Left Ear: External ear normal.  Eyes: Conjunctivae are normal. No scleral icterus.  Neck: Normal range of motion. Neck supple. No thyromegaly present.  Cardiovascular: Normal rate, regular rhythm, normal heart sounds and intact distal pulses.   Pulmonary/Chest: Effort normal and breath sounds normal. No respiratory distress.  Abdominal: Soft. Bowel sounds are normal. She exhibits no distension and no mass. There is no hepatosplenomegaly. There is no tenderness. There is no rebound, no guarding and no CVA tenderness.  Musculoskeletal: She exhibits no edema.       Right knee: She exhibits normal range of motion, no swelling, no effusion, no ecchymosis, no erythema, normal patellar mobility and no bony tenderness. Tenderness found. Lateral joint line tenderness noted.  + crepitus right>left  Lymphadenopathy:    She has no cervical adenopathy.  Neurological: She is alert and oriented to person, place, and time.  Skin: Skin is warm and dry. She is not diaphoretic. No erythema.  Psychiatric: She has a normal mood and affect. Her behavior is normal.          Results for orders placed in visit on 04/28/12  POCT URINALYSIS DIPSTICK      Result Value Range   Color, UA yellow     Clarity, UA clear     Glucose, UA neg     Bilirubin, UA neg     Ketones, UA neg     Spec Grav, UA <=1.005     Blood, UA neg     pH, UA 6.5     Protein, UA neg     Urobilinogen, UA 0.2     Nitrite, UA neg     Leukocytes, UA Negative    POCT UA - MICROSCOPIC ONLY      Result Value Range   WBC, Ur, HPF, POC neg     RBC, urine, microscopic neg     Bacteria, U Microscopic trace     Mucus, UA neg     Epithelial cells, urine per micros 0-1     Crystals, Ur, HPF, POC neg     Casts, Ur, LPF, POC neg     Yeast, UA neg    \ Assessment  & Plan:  Dysuria - Plan: POCT urinalysis dipstick, POCT UA - Microscopic Only, Urine culture - unsure of cause since ua is completely nml - it is very dilute so will send it off for clx and pt will try azo in the meantime.  If sxs cont and culture negative, pt does have a very distant h/o interstitial cystitis  so this could be coming back - she would also need a pelvic exam to ensure mucosal thinning or external urethral irritation is not complicating picture-  Pt declines pelvic today.  Essential hypertension, benign - refilled meds - reminded pt that BP even outside of office is not at goal - f/u with cardiologists as maxed out on many meds.  Osteoarthritis of left knee - Cont tylenol for several days.  Start a glucosamine-chondroitin supplement.  Not interested in joint injections  Unspecified hypothyroidism - refilled meds, declines labs today - will get at [facility] in 3 mos  Meds ordered this encounter  Medications  . DISCONTD: levothyroxine (SYNTHROID, LEVOTHROID) 88 MCG tablet    Sig: TAKE 1 TABLET BY MOUTH DAILY    Dispense:  90 tablet    Refill:  0    Needs OV for additional refills.  Marland Kitchen DISCONTD: benazepril-hydrochlorthiazide (LOTENSIN HCT) 20-12.5 MG per tablet    Sig: Take 2 tablets by mouth daily.    Dispense:  180 tablet    Refill:  0    Pt needs OV before more refills.  Marland Kitchen DISCONTD: carvedilol (COREG) 6.25 MG tablet    Sig: Take 1 tablet (6.25 mg total) by mouth 2 (two) times daily with a meal.    Dispense:  180 tablet    Refill:  0  . DISCONTD: hydrALAZINE (APRESOLINE) 25 MG tablet    Sig: Takes 2 pills every morning, 1 pill at lunch, 2 pills at dinner    Dispense:  450 tablet    Refill:  0  . phenazopyridine (PYRIDIUM) 200 MG tablet    Sig: Take 1 tablet (200 mg total) by mouth 3 (three) times daily as needed for pain.    Dispense:  10 tablet    Refill:  0  . benazepril-hydrochlorthiazide (LOTENSIN HCT) 20-12.5 MG per tablet    Sig: Take 2 tablets by mouth daily.     Dispense:  180 tablet    Refill:  0    Pt needs OV before more refills.  . carvedilol (COREG) 6.25 MG tablet    Sig: Take 1 tablet (6.25 mg total) by mouth 2 (two) times daily with a meal.    Dispense:  180 tablet    Refill:  0  . hydrALAZINE (APRESOLINE) 25 MG tablet    Sig: Takes 2 pills every morning, 1 pill at lunch, 2 pills at dinner    Dispense:  450 tablet    Refill:  0  . levothyroxine (SYNTHROID, LEVOTHROID) 88 MCG tablet    Sig: TAKE 1 TABLET BY MOUTH DAILY    Dispense:  90 tablet    Refill:  0    Needs OV for additional refills.   Will get labs work at physical in 3 mo - tsh, lipids, cmp, cbc, vit D

## 2012-04-30 ENCOUNTER — Other Ambulatory Visit: Payer: Self-pay | Admitting: Family Medicine

## 2012-04-30 LAB — URINE CULTURE: Colony Count: NO GROWTH

## 2012-06-19 ENCOUNTER — Encounter: Payer: Self-pay | Admitting: *Deleted

## 2012-06-19 ENCOUNTER — Encounter: Payer: Self-pay | Admitting: Cardiology

## 2012-07-03 ENCOUNTER — Ambulatory Visit (INDEPENDENT_AMBULATORY_CARE_PROVIDER_SITE_OTHER): Payer: Medicare PPO | Admitting: Family Medicine

## 2012-07-03 VITALS — BP 140/78 | HR 57 | Temp 98.0°F | Resp 18 | Ht 60.5 in | Wt 181.6 lb

## 2012-07-03 DIAGNOSIS — L723 Sebaceous cyst: Secondary | ICD-10-CM

## 2012-07-03 DIAGNOSIS — R52 Pain, unspecified: Secondary | ICD-10-CM

## 2012-07-03 NOTE — Progress Notes (Signed)
[facility] [address] [place] [address] [phone]  Date:  07/03/2012   Name:  [name]   DOB:  [birth year]   MRN:  [id number]  PCP:  Abbe Amsterdam, MD    Chief Complaint: Recurrent Skin Infections   History of Present Illness:  [name] is a 67 y.o. very pleasant female patient who presents with the following:  She is here with a possible infected cyst on her back.  She had noted the cyst for many years, and occasionally it needs to be drained. It can be very large at times.  Over the last couple of days the cyst became inflamed and then started to drain sebacous material after she applied a hot cloth.  Otherwise she feels fine except for "stress." she has gotten a large amount of sebaceous material out of the cyst.  No fever or heat.    Patient Active Problem List   Diagnosis Date Noted  . Essential hypertension, benign 04/28/2012  . Osteoarthritis of left knee 04/28/2012  . Unspecified hypothyroidism 04/28/2012  . Anxiety 11/17/2010  . Sleep apnea 11/17/2010    Past Medical History  Diagnosis Date  . Environmental allergies   . Anemia   . Anxiety   . Osteopenia   . Hypothyroidism   . Sleep apnea   . Vitamin D deficiency     history of  . Obesity   . Interstitial cystitis   . H/O varicella   . History of measles, mumps, or rubella   . Fibroid 02/19/03  . Dysuria 2004  . Menopausal symptoms 2004  . Abnormal perimenopausal bleeding 2005  . Endometrial polyp 2006  . Urinary incontinence 2011  . PMB (postmenopausal bleeding) 06/21/10  . Hypertension 07-19-2010    echo for pre-op EF 55%  normal left wall thickness LA mildly dialated with mitral and tricuspid regurgatation    Past Surgical History  Procedure Laterality Date  . Myomectomy  1974  . Hysteroscopy  2006 and May 2012    for fibroids, endometrial polyps  . Mouth surgery    . Colonoscopy  12/15/10    History  Substance Use Topics  . Smoking status:  Never Smoker   . Smokeless tobacco: Never Used  . Alcohol Use: 0.6 oz/week    1 Glasses of wine per week     Comment: occasional (1-2 times per week)    Family History  Problem Relation Age of Onset  . Colon cancer Paternal Uncle   . Kidney disease Father     from uremia  . Benign prostatic hyperplasia Father   . Clotting disorder Maternal Grandmother     ????    Allergies  Allergen Reactions  . Bactrim (Sulfamethoxazole W-Trimethoprim)   . Epinephrine     shakey  . Erythromycin   . Sulfa Antibiotics Rash    Medication list has been reviewed and updated.  Current Outpatient Prescriptions on File Prior to Visit  Medication Sig Dispense Refill  . Ascorbic Acid (VITAMIN C) 1000 MG tablet Take 1,000 mg by mouth daily.        . benazepril-hydrochlorthiazide (LOTENSIN HCT) 20-12.5 MG per tablet Take 2 tablets by mouth daily.  180 tablet  0  . carvedilol (COREG) 6.25 MG tablet Take 1 tablet (6.25 mg total) by mouth 2 (two) times daily with a meal.  180 tablet  0  . Cholecalciferol (VITAMIN D) 2000 UNITS tablet Take 2,000 Units by mouth daily.        Marland Kitchen  hydrALAZINE (APRESOLINE) 25 MG tablet Takes 2 pills every morning, 1 pill at lunch, 2 pills at dinner  450 tablet  0  . levothyroxine (SYNTHROID, LEVOTHROID) 88 MCG tablet TAKE 1 TABLET BY MOUTH DAILY  90 tablet  0  . liothyronine (CYTOMEL) 25 MCG tablet TAKE ONE-HALF TABLET BY MOUTH EACH DAY  45 tablet  5   No current facility-administered medications on file prior to visit.    Review of Systems:  As per HPI- otherwise negative.'  Physical Examination: Filed Vitals:   07/03/12 1614  BP: 186/72  Pulse: 57  Temp: 98 F (36.7 C)  Resp: 18   Filed Vitals:   07/03/12 1614  Height: 5' 0.5" (1.537 m)  Weight: 181 lb 9.6 oz (82.373 kg)   Body mass index is 34.87 kg/(m^2). Ideal Body Weight: Weight in (lb) to have BMI = 25: 129.9  GEN: WDWN, NAD, Non-toxic, A & O x 3, obese HEENT: Atraumatic, Normocephalic. Neck supple.  No masses, No LAD. Ears and Nose: No external deformity. CV: RRR, No M/G/R. No JVD. No thrill. No extra heart sounds. PULM: CTA B, no wheezes, crackles, rhonchi. No retractions. No resp. distress. No accessory muscle use.v EXTR: No c/c/e NEURO Normal gait.  PSYCH: Normally interactive. Conversant. Not depressed or anxious appearing.  Calm demeanor.  On left upper back is a draining sebaceous cyst.  Gentle pressure expresses a large amount of sebacous, non- purulent material.  The cyst does not appear to be acutely infected   See procedure note per Porfirio Oar, PA-C.   Assessment and Plan: Sebaceous cyst  Non- infected, removed as per separate procedure note.  Plan follow- up for SR.  Pt rested and drank juice in clinic, felt comfortable going home.    Signed Abbe Amsterdam, MD

## 2012-07-03 NOTE — Patient Instructions (Signed)

## 2012-07-03 NOTE — Progress Notes (Signed)
VCO.  Local anesthesia with 3 cc 1% lidocaine, then 3 cc 2% lidocaine.  Prepped.  Elliptical excision made and cyst sac dissected from the surrounding tissue.  Closed with #1 Horizontal mattress and #2 simple interrupted 4-0 Prolene sutures.  Cleansed and dressed.

## 2012-07-08 ENCOUNTER — Ambulatory Visit: Payer: Medicare PPO | Admitting: Family Medicine

## 2012-07-08 VITALS — BP 132/95 | HR 68 | Temp 98.0°F | Resp 17 | Ht 61.0 in | Wt 183.0 lb

## 2012-07-08 DIAGNOSIS — L723 Sebaceous cyst: Secondary | ICD-10-CM

## 2012-07-08 NOTE — Patient Instructions (Addendum)
Leave the pressure dressing in place until tomorrow- then you can either change it or leave it in place until Wednesday.  If you have any more persistent or worrisome bleeding please call or come back in tomorrow.  If you have any significant bleeding that does not resolve with putting pressure on the area seek help right away!   If the wound looks good tomorrow/ not bleeding tomorrow let's pan to recheck on Wednesday to get the stitches out

## 2012-07-08 NOTE — Progress Notes (Signed)
[facility] [address], [place] [address] [phone]  Date:  07/08/2012   Name:  [name]   DOB:  1945-09-23   MRN:  [id number]  PCP:  Abbe Amsterdam, MD    Chief Complaint: Wound Check   History of Present Illness:  [name] is a 67 y.o. very pleasant female patient who presents with the following:  Removed a sebaceous cyst capsule from her left shoulder on 07/03/12.  The area had been doing well until today- she was taking a shower and washed the area with a mesh sponge.  When she got out of the shower her sister was helping her apply a bandage and noted some bleeding.  It has been bleeding a small amount today so she decided to come in for evaluation.    Otherwise she is ok except she feels nervous which is typical for her  Patient Active Problem List   Diagnosis Date Noted  . Essential hypertension, benign 04/28/2012  . Osteoarthritis of left knee 04/28/2012  . Unspecified hypothyroidism 04/28/2012  . Anxiety 11/17/2010  . Sleep apnea 11/17/2010    Past Medical History  Diagnosis Date  . Environmental allergies   . Anemia   . Anxiety   . Osteopenia   . Hypothyroidism   . Sleep apnea   . Vitamin D deficiency     history of  . Obesity   . Interstitial cystitis   . H/O varicella   . History of measles, mumps, or rubella   . Fibroid 02/19/03  . Dysuria 2004  . Menopausal symptoms 2004  . Abnormal perimenopausal bleeding 2005  . Endometrial polyp 2006  . Urinary incontinence 2011  . PMB (postmenopausal bleeding) 06/21/10  . Hypertension 07-19-2010    echo for pre-op EF 55%  normal left wall thickness LA mildly dialated with mitral and tricuspid regurgatation    Past Surgical History  Procedure Laterality Date  . Myomectomy  1974  . Hysteroscopy  2006 and May 2012    for fibroids, endometrial polyps  . Mouth surgery    . Colonoscopy  12/15/10    History  Substance Use Topics  . Smoking status: Never Smoker    . Smokeless tobacco: Never Used  . Alcohol Use: 0.6 oz/week    1 Glasses of wine per week     Comment: occasional (1-2 times per week)    Family History  Problem Relation Age of Onset  . Colon cancer Paternal Uncle   . Kidney disease Father     from uremia  . Benign prostatic hyperplasia Father   . Clotting disorder Maternal Grandmother     ????    Allergies  Allergen Reactions  . Bactrim (Sulfamethoxazole W-Trimethoprim)   . Epinephrine     shakey  . Erythromycin   . Sulfa Antibiotics Rash    Medication list has been reviewed and updated.  Current Outpatient Prescriptions on File Prior to Visit  Medication Sig Dispense Refill  . Ascorbic Acid (VITAMIN C) 1000 MG tablet Take 1,000 mg by mouth daily.        . benazepril-hydrochlorthiazide (LOTENSIN HCT) 20-12.5 MG per tablet Take 2 tablets by mouth daily.  180 tablet  0  . carvedilol (COREG) 6.25 MG tablet Take 1 tablet (6.25 mg total) by mouth 2 (two) times daily with a meal.  180 tablet  0  . Cholecalciferol (VITAMIN D) 2000 UNITS tablet Take 2,000 Units by mouth daily.        Marland Kitchen  hydrALAZINE (APRESOLINE) 25 MG tablet Takes 2 pills every morning, 1 pill at lunch, 2 pills at dinner  450 tablet  0  . levothyroxine (SYNTHROID, LEVOTHROID) 88 MCG tablet TAKE 1 TABLET BY MOUTH DAILY  90 tablet  0  . liothyronine (CYTOMEL) 25 MCG tablet TAKE ONE-HALF TABLET BY MOUTH EACH DAY  45 tablet  5   No current facility-administered medications on file prior to visit.    Review of Systems:  As per HPI- otherwise negative.   Physical Examination: Filed Vitals:   07/08/12 1539  BP: 185/81  Pulse: 68  Temp: 98 F (36.7 C)  Resp: 17   Filed Vitals:   07/08/12 1539  Height: 5\' 1"  (1.549 m)  Weight: 183 lb (83.008 kg)   Body mass index is 34.6 kg/(m^2). Ideal Body Weight: Weight in (lb) to have BMI = 25: 132   GEN: WDWN, NAD, Non-toxic, Alert & Oriented x 3 HEENT: Atraumatic, Normocephalic.  Ears and Nose: No external  deformity. EXTR: No clubbing/cyanosis/edema NEURO: Normal gait.  PSYCH: Normally interactive. Conversant. Not depressed or anxious appearing.  Calm demeanor.  Left posterior shoulder- removed bandage.  Wound from cyst removal in good repair.  No sign of infection but she does have some skin irritation likely from band- aid.  Small amount of blood can be expressed by squeezing wound.  It is slightly dark and appears to be old but not clotted. Expressed less than 5ml of blood. Placed a pressure dressing on the wound by putting a folded gauze under hipafix.    Assessment and Plan: Sebaceous cyst  Here for a recheck of cyst on her back.  Suspect that some blood had collected where the cyst capsule was removed.  Washing the wound with the mesh sponge today likely disturbed the wound and allowed this blood to come out.  No persistent or significant bleeding.  Plan for close follow- up if any persistent bleeding.   See patient instructions for more details.     Signed Abbe Amsterdam, MD

## 2012-07-10 ENCOUNTER — Ambulatory Visit (INDEPENDENT_AMBULATORY_CARE_PROVIDER_SITE_OTHER): Payer: Medicare PPO | Admitting: Family Medicine

## 2012-07-10 VITALS — BP 150/80 | HR 58 | Temp 98.1°F | Resp 16 | Ht 61.0 in | Wt 183.0 lb

## 2012-07-10 DIAGNOSIS — Z79899 Other long term (current) drug therapy: Secondary | ICD-10-CM

## 2012-07-10 DIAGNOSIS — Z5189 Encounter for other specified aftercare: Secondary | ICD-10-CM

## 2012-07-10 NOTE — Progress Notes (Signed)
[facility] [address] [place] [address] [phone]  Date:  07/10/2012   Name:  [name]   DOB:  June 25, 1945   MRN:  [id number]  PCP:  Abbe Amsterdam, MD    Chief Complaint: Suture / Staple Removal   History of Present Illness:  [name] is a 67 y.o. very pleasant female patient who presents with the following:  Here today to follow-up from a sebaceous cyst removal from 07/03/12. The area is overall doing well and is not painful, but was seeping some blood on Monday so she came in for a recheck today.  She has not noted any further significant bleeding  Patient Active Problem List   Diagnosis Date Noted  . Essential hypertension, benign 04/28/2012  . Osteoarthritis of left knee 04/28/2012  . Unspecified hypothyroidism 04/28/2012  . Anxiety 11/17/2010  . Sleep apnea 11/17/2010    Past Medical History  Diagnosis Date  . Environmental allergies   . Anemia   . Anxiety   . Osteopenia   . Hypothyroidism   . Sleep apnea   . Vitamin D deficiency     history of  . Obesity   . Interstitial cystitis   . H/O varicella   . History of measles, mumps, or rubella   . Fibroid 02/19/03  . Dysuria 2004  . Menopausal symptoms 2004  . Abnormal perimenopausal bleeding 2005  . Endometrial polyp 2006  . Urinary incontinence 2011  . PMB (postmenopausal bleeding) 06/21/10  . Hypertension 07-19-2010    echo for pre-op EF 55%  normal left wall thickness LA mildly dialated with mitral and tricuspid regurgatation    Past Surgical History  Procedure Laterality Date  . Myomectomy  1974  . Hysteroscopy  2006 and May 2012    for fibroids, endometrial polyps  . Mouth surgery    . Colonoscopy  12/15/10    History  Substance Use Topics  . Smoking status: Never Smoker   . Smokeless tobacco: Never Used  . Alcohol Use: 0.6 oz/week    1 Glasses of wine per week     Comment: occasional (1-2 times per week)    Family History  Problem Relation  Age of Onset  . Colon cancer Paternal Uncle   . Kidney disease Father     from uremia  . Benign prostatic hyperplasia Father   . Clotting disorder Maternal Grandmother     ????    Allergies  Allergen Reactions  . Bactrim (Sulfamethoxazole W-Trimethoprim)   . Epinephrine     shakey  . Erythromycin   . Sulfa Antibiotics Rash    Medication list has been reviewed and updated.  Current Outpatient Prescriptions on File Prior to Visit  Medication Sig Dispense Refill  . Ascorbic Acid (VITAMIN C) 1000 MG tablet Take 1,000 mg by mouth daily.        . benazepril-hydrochlorthiazide (LOTENSIN HCT) 20-12.5 MG per tablet Take 2 tablets by mouth daily.  180 tablet  0  . carvedilol (COREG) 6.25 MG tablet Take 1 tablet (6.25 mg total) by mouth 2 (two) times daily with a meal.  180 tablet  0  . Cholecalciferol (VITAMIN D) 2000 UNITS tablet Take 2,000 Units by mouth daily.        . hydrALAZINE (APRESOLINE) 25 MG tablet Takes 2 pills every morning, 1 pill at lunch, 2 pills at dinner  450 tablet  0  . levothyroxine (SYNTHROID, LEVOTHROID) 88 MCG tablet TAKE 1 TABLET BY MOUTH DAILY  90 tablet  0  . liothyronine (CYTOMEL) 25 MCG tablet TAKE ONE-HALF TABLET BY MOUTH EACH DAY  45 tablet  5   No current facility-administered medications on file prior to visit.    Review of Systems:  As per HPI- otherwise negative.   Physical Examination: Filed Vitals:   07/10/12 1447  BP: 191/75  Pulse: 58  Temp: 98.1 F (36.7 C)  Resp: 16   Filed Vitals:   07/10/12 1447  Height: 5\' 1"  (1.549 m)  Weight: 183 lb (83.008 kg)   Body mass index is 34.6 kg/(m^2). Ideal Body Weight: Weight in (lb) to have BMI = 25: 132   GEN: WDWN, NAD, Non-toxic, Alert & Oriented x 3 HEENT: Atraumatic, Normocephalic.  Ears and Nose: No external deformity. EXTR: No clubbing/cyanosis/edema NEURO: Normal gait.  PSYCH: Normally interactive. Conversant. Not depressed or anxious appearing.  Calm demeanor.  Left posterior  shoulder: the wound continues to drain a small amount of dark blood when pressed, but the amount is less.  otherwise the wound looks great, no evidence of infection. Sutures are in place  Assessment and Plan: Encounter for wound care  Sebaceous cyst removed one week ago.  It was a large sebaceous cyst, and the cavity is still collecting a small amount of blood.  However, it is less than 2 days ago and overall is doing well.  Plan to recheck and probably remove sutures in 2 days.    Signed Abbe Amsterdam, MD

## 2012-07-10 NOTE — Patient Instructions (Addendum)
Please come back for a recheck of your wound and suture removal on Friday.  I will look for you after 10 am.  Please let us know if any problems in the meantime.

## 2012-07-12 ENCOUNTER — Ambulatory Visit (INDEPENDENT_AMBULATORY_CARE_PROVIDER_SITE_OTHER): Payer: Medicare PPO | Admitting: Family Medicine

## 2012-07-12 VITALS — BP 160/85 | HR 62 | Temp 98.2°F | Resp 16 | Ht 61.0 in | Wt 183.0 lb

## 2012-07-12 DIAGNOSIS — Z5189 Encounter for other specified aftercare: Secondary | ICD-10-CM

## 2012-07-12 NOTE — Progress Notes (Signed)
[facility] [address] [place] [address] [phone]  Date:  07/12/2012   Name:  [name]   DOB:  1946/02/04   MRN:  [id number]  PCP:  Abbe Amsterdam, MD    Chief Complaint: Wound Check   History of Present Illness:  [name] is a 67 y.o. very pleasant female patient who presents with the following:  Here today for SR.  She had a sebaceous cyst removed from her back approx 10 days ago.   She feels that the wound is itching a little bit, but otherwise is doing well.  She has not noted any significant bleeding or pain.  No fever or other systemic symptoms.  She admits to feeling anxious about coming to the doctor which does tend to increase her BP.   Patient Active Problem List   Diagnosis Date Noted  . Essential hypertension, benign 04/28/2012  . Osteoarthritis of left knee 04/28/2012  . Unspecified hypothyroidism 04/28/2012  . Anxiety 11/17/2010  . Sleep apnea 11/17/2010    Past Medical History  Diagnosis Date  . Environmental allergies   . Anemia   . Anxiety   . Osteopenia   . Hypothyroidism   . Sleep apnea   . Vitamin D deficiency     history of  . Obesity   . Interstitial cystitis   . H/O varicella   . History of measles, mumps, or rubella   . Fibroid 02/19/03  . Dysuria 2004  . Menopausal symptoms 2004  . Abnormal perimenopausal bleeding 2005  . Endometrial polyp 2006  . Urinary incontinence 2011  . PMB (postmenopausal bleeding) 06/21/10  . Hypertension 07-19-2010    echo for pre-op EF 55%  normal left wall thickness LA mildly dialated with mitral and tricuspid regurgatation    Past Surgical History  Procedure Laterality Date  . Myomectomy  1974  . Hysteroscopy  2006 and May 2012    for fibroids, endometrial polyps  . Mouth surgery    . Colonoscopy  12/15/10    History  Substance Use Topics  . Smoking status: Never Smoker   . Smokeless tobacco: Never Used  . Alcohol Use: 0.6 oz/week    1 Glasses of  wine per week     Comment: occasional (1-2 times per week)    Family History  Problem Relation Age of Onset  . Colon cancer Paternal Uncle   . Kidney disease Father     from uremia  . Benign prostatic hyperplasia Father   . Clotting disorder Maternal Grandmother     ????    Allergies  Allergen Reactions  . Bactrim (Sulfamethoxazole W-Trimethoprim)   . Epinephrine     shakey  . Erythromycin   . Sulfa Antibiotics Rash    Medication list has been reviewed and updated.  Current Outpatient Prescriptions on File Prior to Visit  Medication Sig Dispense Refill  . Ascorbic Acid (VITAMIN C) 1000 MG tablet Take 1,000 mg by mouth daily.        . benazepril-hydrochlorthiazide (LOTENSIN HCT) 20-12.5 MG per tablet Take 2 tablets by mouth daily.  180 tablet  0  . carvedilol (COREG) 6.25 MG tablet Take 1 tablet (6.25 mg total) by mouth 2 (two) times daily with a meal.  180 tablet  0  . Cholecalciferol (VITAMIN D) 2000 UNITS tablet Take 2,000 Units by mouth daily.        . hydrALAZINE (APRESOLINE) 25 MG tablet Takes 2 pills every morning, 1 pill at  lunch, 2 pills at dinner  450 tablet  0  . levothyroxine (SYNTHROID, LEVOTHROID) 88 MCG tablet TAKE 1 TABLET BY MOUTH DAILY  90 tablet  0  . liothyronine (CYTOMEL) 25 MCG tablet TAKE ONE-HALF TABLET BY MOUTH EACH DAY  45 tablet  5   No current facility-administered medications on file prior to visit.    Review of Systems:  As per HPI- otherwise negative.   Physical Examination: Filed Vitals:   07/12/12 1515  BP: 240/78  Pulse: 62  Temp: 98.2 F (36.8 C)  Resp: 16   Filed Vitals:   07/12/12 1515  Height: 5\' 1"  (1.549 m)  Weight: 183 lb (83.008 kg)   Body mass index is 34.6 kg/(m^2). Ideal Body Weight: Weight in (lb) to have BMI = 25: 132   GEN: WDWN, NAD, Non-toxic, Alert & Oriented x 3 HEENT: Atraumatic, Normocephalic.  Ears and Nose: No external deformity. EXTR: No clubbing/cyanosis/edema NEURO: Normal gait.  PSYCH:  Normally interactive. Conversant. Not depressed or anxious appearing.  Calm demeanor.  Rechecked BP- much better.   Examined wound on left shoulder.  She has 2 SI and one HM suture.  The HM appears to have pulled through the skin on the loop (non- knot) side of the suture.  Was careful to cut just one side of the knot and do not believe any suture is left under the skin.  SI sutures removed as well without incident.  Able to express a scant amount of blood from the wound today- much reduced from past 2 visits. The wound is nearly healed, but a small areas remains open where the blood drains.  Dressed with non- stick pad and hipafix.  Advised her that some blood may continue to drain for the next few days, but it should continue to become less and less.    Assessment and Plan: Encounter for wound care  Removed sutures.  Wound is healing well.  She had a very large sebaceous cyst ("it was there for 40 years") so some blood collection from the site is not unusual.  The amount of blood is steadily decreasing.  She will continue to keep the wound clean and covered as long as it continues to bleed at all.  She will let me know if any concerns remain about this area.   Signed Abbe Amsterdam, MD

## 2012-07-16 ENCOUNTER — Ambulatory Visit: Payer: Medicare HMO | Admitting: Cardiology

## 2012-07-31 ENCOUNTER — Other Ambulatory Visit: Payer: Self-pay | Admitting: Family Medicine

## 2012-08-16 ENCOUNTER — Encounter: Payer: Self-pay | Admitting: Family Medicine

## 2012-08-16 ENCOUNTER — Ambulatory Visit (INDEPENDENT_AMBULATORY_CARE_PROVIDER_SITE_OTHER): Payer: Medicare PPO | Admitting: Family Medicine

## 2012-08-16 VITALS — BP 140/74 | HR 58 | Temp 98.3°F | Resp 12 | Ht 60.5 in | Wt 181.2 lb

## 2012-08-16 DIAGNOSIS — E039 Hypothyroidism, unspecified: Secondary | ICD-10-CM

## 2012-08-16 DIAGNOSIS — Z0000 Encounter for general adult medical examination without abnormal findings: Secondary | ICD-10-CM

## 2012-08-16 DIAGNOSIS — R109 Unspecified abdominal pain: Secondary | ICD-10-CM

## 2012-08-16 DIAGNOSIS — I10 Essential (primary) hypertension: Secondary | ICD-10-CM

## 2012-08-16 MED ORDER — LEVOTHYROXINE SODIUM 88 MCG PO TABS: ORAL_TABLET | ORAL | Status: DC

## 2012-08-16 MED ORDER — CARVEDILOL 6.25 MG PO TABS: ORAL_TABLET | ORAL | Status: DC

## 2012-08-16 MED ORDER — LIOTHYRONINE SODIUM 25 MCG PO TABS: ORAL_TABLET | ORAL | Status: DC

## 2012-08-16 MED ORDER — BENAZEPRIL-HYDROCHLOROTHIAZIDE 20-12.5 MG PO TABS: 2.0000 | ORAL_TABLET | Freq: Every day | ORAL | Status: DC

## 2012-08-16 MED ORDER — HYDRALAZINE HCL 25 MG PO TABS: ORAL_TABLET | ORAL | Status: DC

## 2012-08-16 NOTE — Patient Instructions (Signed)

## 2012-08-16 NOTE — Progress Notes (Signed)
Subjective:    [name] is a 67 y.o. female who presents for Medicare Annual/Subsequent preventive examination.  Preventive Screening-Counseling & Management  Tobacco History  Smoking status  . Never Smoker   Smokeless tobacco  . Never Used     Problems Prior to Visit 1. Trouble sleeping - waking up with need to void and then ofen can't fall back asleep, esp if around 4 a.m., then falling asleep mid-day but won't wear cpap when napping.  Has stopped drinking fluids after 7-8 p.m. which has helped some. If she lays in bed long enough she will get tired but often gets on her ipad. 2. HTN - maxed out on meds - does not want to change combo pill benazepril/hctz, which she is taking 2 tabs a day of, to benazepril and hctz separately so that she can ensure to take the hctz well before lunch time so as to not exacerbate nocturia. 3.  Healing cyst on back from surgery - couple of weeks ago it opened up and white stuff drained out but it scabbed over again. 4. Tingling in left leg and arm.  - ever since she has gotten her new Ipad - about a 1 1/2 yrs - intermittently.  Will come on for a few seconds and happening more frequently now every few days.  Occurs simultaneously in both arms and legs - like a bug is crawling up - but no weakness. Does not noticed anything she can do to induce it or make it more frequent - is on her ipad constantly - no idea about relieving factors.  Concerned this could be a TIA  Current Problems (verified) Patient Active Problem List   Diagnosis Date Noted  . Essential hypertension, benign 04/28/2012  . Osteoarthritis of left knee 04/28/2012  . Unspecified hypothyroidism 04/28/2012  . Anxiety 11/17/2010  . Sleep apnea 11/17/2010    Medications Prior to Visit Current Outpatient Prescriptions on File Prior to Visit  Medication Sig Dispense Refill  . Ascorbic Acid (VITAMIN C) 1000 MG tablet Take 1,000 mg by mouth daily.        . benazepril-hydrochlorthiazide  (LOTENSIN HCT) 20-12.5 MG per tablet Take 2 tablets by mouth daily.  180 tablet  0  . carvedilol (COREG) 6.25 MG tablet TAKE 1 TABLET BY MOUTH TWICE DAILY WITH FOOD (NEED TO MAKE MD APPT.)  180 tablet  0  . hydrALAZINE (APRESOLINE) 25 MG tablet TAKE 2 TABLETS BY MOUTH EVERY MORNING, TAKE 1 TABLET AT LUNCH TIME AND THEN TAKE 2 TABLETS AT DINNER TIME DAILY  450 tablet  0  . levothyroxine (SYNTHROID, LEVOTHROID) 88 MCG tablet TAKE 1 TABLET BY MOUTH DAILY  90 tablet  0  . liothyronine (CYTOMEL) 25 MCG tablet TAKE ONE-HALF TABLET BY MOUTH EACH DAY  45 tablet  5  . Cholecalciferol (VITAMIN D) 2000 UNITS tablet Take 2,000 Units by mouth daily.         No current facility-administered medications on file prior to visit.    Current Medications (verified) Current Outpatient Prescriptions  Medication Sig Dispense Refill  . Ascorbic Acid (VITAMIN C) 1000 MG tablet Take 1,000 mg by mouth daily.        . benazepril-hydrochlorthiazide (LOTENSIN HCT) 20-12.5 MG per tablet Take 2 tablets by mouth daily.  180 tablet  0  . carvedilol (COREG) 6.25 MG tablet TAKE 1 TABLET BY MOUTH TWICE DAILY WITH FOOD (NEED TO MAKE MD APPT.)  180 tablet  0  . hydrALAZINE (APRESOLINE) 25 MG tablet TAKE  2 TABLETS BY MOUTH EVERY MORNING, TAKE 1 TABLET AT LUNCH TIME AND THEN TAKE 2 TABLETS AT DINNER TIME DAILY  450 tablet  0  . levothyroxine (SYNTHROID, LEVOTHROID) 88 MCG tablet TAKE 1 TABLET BY MOUTH DAILY  90 tablet  0  . liothyronine (CYTOMEL) 25 MCG tablet TAKE ONE-HALF TABLET BY MOUTH EACH DAY  45 tablet  5  . Cholecalciferol (VITAMIN D) 2000 UNITS tablet Take 2,000 Units by mouth daily.         No current facility-administered medications for this visit.     Allergies (verified) Bactrim; Epinephrine; Erythromycin; and Sulfa antibiotics   PAST HISTORY  Family History Family History  Problem Relation Age of Onset  . Colon cancer Paternal Uncle   . Kidney disease Father     from uremia  . Benign prostatic hyperplasia  Father   . Clotting disorder Maternal Grandmother     ????  . Hypertension Mother     Social History History  Substance Use Topics  . Smoking status: Never Smoker   . Smokeless tobacco: Never Used  . Alcohol Use: 0.6 oz/week    1 Glasses of wine per week     Comment: occasional (1-2 times per week)     Are there smokers in your home (other than you)? No  Risk Factors Current exercise habits: Gym/ health club routine includes yoga.  Dietary issues discussed: low salt   Cardiac risk factors: advanced age (older than 54 for men, 72 for women), hypertension and obesity (BMI >= 30 kg/m2).  Depression Screen (Note: if answer to either of the following is "Yes", a more complete depression screening is indicated)   Over the past two weeks, have you felt down, depressed or hopeless? No  Over the past two weeks, have you felt little interest or pleasure in doing things? No  Have you lost interest or pleasure in daily life? No  Do you often feel hopeless? No  Do you cry easily over simple problems? No  Activities of Daily Living In your present state of health, do you have any difficulty performing the following activities?:  Driving? No Managing money?  No Feeding yourself? No Getting from bed to chair? No Climbing a flight of stairs? No Preparing food and eating?: No Bathing or showering? No Getting dressed: No Getting to the toilet? No Using the toilet:No Moving around from place to place: No In the past year have you fallen or had a near fall?:Yes   Are you sexually active?  No  Do you have more than one partner?  No  Hearing Difficulties: No Do you often ask people to speak up or repeat themselves? No Do you experience ringing or noises in your ears? Yes Do you have difficulty understanding soft or whispered voices? No   Do you feel that you have a problem with memory? Yes - once in a while  Do you often misplace items? Yes  Do you feel safe at home?   Yes  Cognitive Testing  Alert? Yes  Normal Appearance?Yes  Oriented to person? Yes  Place? Yes   Time? Yes  Recall of three objects?  No - can remember 2/3  Can perform simple calculations? Yes  Displays appropriate judgment?Yes  Can read the correct time from a watch face?Yes   Advanced Directives have been discussed with the patient? Yes  List the Names of Other Physician/Practitioners you currently use: 1.   Dr. Herbie [facility] 2. Gynecologist - [facility]  for hystostomy 3. Dr. Leone Payor for colonsocopy 4. Dentist is Christiana Fuchs on [address], Dr. Andi Hence, endodontist. 5.  [facility]  Indicate any recent Medical Services you may have received from other than [facility] providers in the past year (date may be approximate). See chart  There is no immunization history on file for this patient.  Screening Tests Health Maintenance  Topic Date Due  . Tetanus/tdap  08/16/1963  . Zostavax  08/15/2004  . Pneumococcal Polysaccharide Vaccine Age 2 And Over  08/15/2009  . Influenza Vaccine  11/04/2012  . Colonoscopy  12/14/2013  . Mammogram  04/25/2014  No h/o precancerous pap smears. Colonoscopy doe 2012 - found 4 polyps so plan to repeat late 2015. All answers were reviewed with the patient and necessary referrals were made:  [facility], [place]   08/16/2012   History reviewed: allergies, current medications, past family history, past medical history, past social history, past surgical history and problem list  Review of Systems Pertinent items are noted in HPI.    Objective:     Vision by Snellen chart: right eye:20/25, left eye:20/20  Body mass index is 34.79 kg/(m^2). BP 140/74  Pulse 58  Temp(Src) 98.3 F (36.8 C) (Oral)  Resp 12  Ht 5' 0.5" (1.537 m)  Wt 181 lb 3.2 oz (82.192 kg)  BMI 34.79 kg/m2  SpO2 99%  BP 140/74  Pulse 58  Temp(Src) 98.3 F (36.8 C) (Oral)  Resp 12  Ht 5' 0.5" (1.537 m)  Wt 181 lb 3.2 oz  (82.192 kg)  BMI 34.79 kg/m2  SpO2 99%  General Appearance:    Alert, cooperative, no distress, appears stated age  Head:    Normocephalic, without obvious abnormality, atraumatic  Eyes:    PERRL, conjunctiva/corneas clear, EOM's intact, fundi    benign, both eyes  Ears:    Normal TM's and external ear canals, both ears  Nose:   Nares normal, septum midline, mucosa normal, no drainage    or sinus tenderness  Throat:   Lips, mucosa, and tongue normal; teeth and gums normal  Neck:   Supple, symmetrical, trachea midline, no adenopathy;    thyroid:  no enlargement/tenderness/nodules; no carotid   bruit or JVD  Back:     Symmetric, no curvature, ROM normal, no CVA tenderness  Lungs:     Clear to auscultation bilaterally, respirations unlabored  Chest Wall:    No tenderness or deformity   Heart:    Regular rate and rhythm, S1 and S2 normal, no murmur, rub   or gallop  Breast Exam:    No tenderness, masses, or nipple abnormality  Abdomen:     Soft, non-tender, bowel sounds active all four quadrants,    no masses, no organomegaly        Extremities:   Extremities normal, atraumatic, no cyanosis or edema  Pulses:   2+ and symmetric all extremities  Skin:   Skin color, texture, turgor normal, no rashes or lesions  Lymph nodes:   Cervical, supraclavicular, and axillary nodes normal  Neurologic:   CNII-XII intact, normal strength, sensation and reflexes    throughout       Assessment:     Wellness exam      Plan:     During the course of the visit the patient was educated and counseled about appropriate screening and preventive services including:    Pneumococcal vaccine - pt states she got in 2009 and listed "09" in chart, but no documentation of this being  given at [facility] - Pt did not turn 67 yo till 2011 so need pneumovax repeated at f/u x 1.  Td vaccine - done 02/2008  Screening mammography - done yearly - last done 03/2012 with biopsy  Screening Pap smear and pelvic exam - no  longer needed due to age, followed by [facility]  Bone densitometry screening - DEXA scan 02/2012 with osteopenia T score right femur neck -1.8  Colorectal cancer screening - 12/2010 with 4 polyps - repeat 01/2014.  Diabetes screening  Nutrition counseling   Advanced directives:handoutgiven for pt to review  Declines Zoster vaccine since had the chicken pox in her 40s.  Does not get flu shot - has only had the flu once and doesn't want it.  Diet review for nutrition referral? Yes ____  Not Indicated __X__   Patient Instructions (the written plan) was given to the patient.  Medicare Attestation I have personally reviewed: The patient's medical and social history Their use of alcohol, tobacco or illicit drugs Their current medications and supplements The patient's functional ability including ADLs,fall risks, home safety risks, cognitive, and hearing and visual impairment Diet and physical activities Evidence for depression or mood disorders  The patient's weight, height, BMI, and visual acuity have been recorded in the chart.  I have made referrals, counseling, and provided education to the patient based on review of the above and I have provided the patient with a written personalized care plan for preventive services.     Norberto Sorenson, MD   08/16/2012

## 2012-08-16 NOTE — Progress Notes (Signed)
Subjective:    Patient ID: [name], female    DOB: [birth year], 67 y.o.   MRN: [id number] Chief Complaint  Patient presents with  . Annual Exam    no/pap   HPI  Not sleeping well so very tired. She has stopped taking her vitamin D supp so thinks her level might be low. She is doing a little better if she doesn't drink fluids after 7-8 pm and then goes to bed around 10-11.  However, if she does wake to void she will be unable to go back to sleep.  Because of this she has been feeling very sleepy during the day. Naps unintentionally on sofa but doesn't like to take naps during the day because she doesn't wear her CPAP machine.  Usually falling to sleep ok - it is just getting back to sleep after waking up.  Usually only wakes once with the need to urinate. Does not want medication for this.  Past Medical History  Diagnosis Date  . Environmental allergies   . Anemia   . Anxiety   . Osteopenia 02/2012    -1.8 T score right femur neck  . Hypothyroidism   . Sleep apnea   . Vitamin D deficiency     history of  . Obesity   . Interstitial cystitis   . H/O varicella   . History of measles, mumps, or rubella   . Fibroid 02/19/03  . Dysuria 2004  . Menopausal symptoms 2004  . Abnormal perimenopausal bleeding 2005  . Endometrial polyp 2006  . Urinary incontinence 2011  . PMB (postmenopausal bleeding) 06/21/10  . Hypertension 07-19-2010    echo for pre-op EF 55%  normal left wall thickness LA mildly dialated with mitral and tricuspid regurgatation   Current Outpatient Prescriptions on File Prior to Visit  Medication Sig Dispense Refill  . Ascorbic Acid (VITAMIN C) 1000 MG tablet Take 1,000 mg by mouth daily.        . Cholecalciferol (VITAMIN D) 2000 UNITS tablet Take 2,000 Units by mouth daily.         No current facility-administered medications on file prior to visit.   Allergies  Allergen Reactions  . Bactrim (Sulfamethoxazole W-Trimethoprim)   . Epinephrine     shakey  .  Erythromycin   . Sulfa Antibiotics Rash     Review of Systems  Constitutional: Negative.   HENT: Negative.   Eyes: Negative.   Respiratory: Positive for apnea.   Cardiovascular: Negative.   Gastrointestinal: Negative.   Endocrine: Negative.   Genitourinary: Negative.   Musculoskeletal: Negative.   Skin: Negative.   Allergic/Immunologic: Negative.   Neurological: Negative.   Hematological: Negative.   Psychiatric/Behavioral: Positive for sleep disturbance.      BP 140/74  Pulse 58  Temp(Src) 98.3 F (36.8 C) (Oral)  Resp 12  Ht 5' 0.5" (1.537 m)  Wt 181 lb 3.2 oz (82.192 kg)  BMI 34.79 kg/m2  SpO2 99% Objective:   Physical Exam  Constitutional: She is oriented to person, place, and time. She appears well-developed and well-nourished. No distress.  HENT:  Head: Normocephalic and atraumatic.  Right Ear: Tympanic membrane, external ear and ear canal normal.  Left Ear: Tympanic membrane, external ear and ear canal normal.  Nose: Nose normal. No mucosal edema or rhinorrhea.  Mouth/Throat: Uvula is midline, oropharynx is clear and moist and mucous membranes are normal. No posterior oropharyngeal erythema.  Eyes: Conjunctivae and EOM are normal. Pupils are equal, round, and reactive to light. Right  eye exhibits no discharge. Left eye exhibits no discharge. No scleral icterus.  Neck: Normal range of motion. Neck supple. No thyromegaly present.  Cardiovascular: Normal rate, regular rhythm, normal heart sounds and intact distal pulses.   Pulmonary/Chest: Effort normal and breath sounds normal. No respiratory distress.  Abdominal: Soft. Bowel sounds are normal. There is no tenderness.  Musculoskeletal: She exhibits no edema.  Lymphadenopathy:    She has no cervical adenopathy.  Neurological: She is alert and oriented to person, place, and time. She has normal reflexes.  Skin: Skin is warm and dry. She is not diaphoretic. No erythema.  Psychiatric: She has a normal mood and  affect. Her behavior is normal.          Assessment & Plan:  Abdominal pain, unspecified site - Plan: CBC with Differential, Comprehensive metabolic panel  Routine general medical examination at a health care facility - Plan: Sedimentation rate, Vitamin D 25 hydroxy, Lipid panel, CBC with Differential, Comprehensive metabolic panel, TSH, T3, free, T4, free - see above annual medicare wellness exam.  Essential hypertension, benign - Plan: Sedimentation rate, Vitamin D 25 hydroxy, Lipid panel, CBC with Differential, Comprehensive metabolic panel, TSH, T3, free, T4, free - Pt maxed out on antihypertensives. She does not take her benazepril-hctz in the morning w/ her carvedilol and 2 hydralazine as she does not want to bottom her blood pressure out but if she wakes up late then she is taking it after oon and then she has more nocturia - offered to split this into separate pills (since she is already taking 2 tabs of the combo pill). so she could just take her hctz earlier but pt declines - worries about copays and changes.  Hypothyroid - is on BOTH cytomel and levothyroxine as still had severe fatigue on levothyroxine alone - started by an endocrinologist.  Extremity tingling - does not sound neurologic due to very short duration and both upper and lower - perhaps dehydration or mild electrolyte imbalance. Cont to monitor.  Prev removed sebaceous cyst - healing, cont warm compresses and encourage drainage as it accumulates.  Meds ordered this encounter  Medications  . liothyronine (CYTOMEL) 25 MCG tablet    Sig: TAKE ONE-HALF TABLET BY MOUTH EACH DAY    Dispense:  45 tablet    Refill:  1    CYCLE FILL MEDICATION. Authorization is required for next refill.  . levothyroxine (SYNTHROID, LEVOTHROID) 88 MCG tablet    Sig: TAKE 1 TABLET BY MOUTH DAILY    Dispense:  90 tablet    Refill:  1    Needs OV for additional refills.  . hydrALAZINE (APRESOLINE) 25 MG tablet    Sig: TAKE 2 TABLETS BY  MOUTH EVERY MORNING, TAKE 1 TABLET AT LUNCH TIME AND THEN TAKE 2 TABLETS AT DINNER TIME DAILY    Dispense:  450 tablet    Refill:  1    CYCLE FILL MEDICATION. Authorization is required for next refill.  . carvedilol (COREG) 6.25 MG tablet    Sig: TAKE 1 TABLET BY MOUTH TWICE DAILY WITH FOOD (NEED TO MAKE MD APPT.)    Dispense:  180 tablet    Refill:  1    CYCLE FILL MEDICATION. Authorization is required for next refill.  . benazepril-hydrochlorthiazide (LOTENSIN HCT) 20-12.5 MG per tablet    Sig: Take 2 tablets by mouth daily.    Dispense:  180 tablet    Refill:  1    Pt needs OV before more refills.

## 2012-08-17 LAB — CBC WITH DIFFERENTIAL/PLATELET
Basophils Relative: 0 % (ref 0–1)
Eosinophils Absolute: 0.3 10*3/uL (ref 0.0–0.7)
MCH: 26.6 pg (ref 26.0–34.0)
MCHC: 33.1 g/dL (ref 30.0–36.0)
Neutrophils Relative %: 64 % (ref 43–77)
Platelets: 265 10*3/uL (ref 150–400)
RBC: 5.07 MIL/uL (ref 3.87–5.11)

## 2012-08-17 LAB — COMPREHENSIVE METABOLIC PANEL
ALT: 11 U/L (ref 0–35)
Alkaline Phosphatase: 59 U/L (ref 39–117)
Sodium: 141 mEq/L (ref 135–145)
Total Bilirubin: 0.5 mg/dL (ref 0.3–1.2)
Total Protein: 6.8 g/dL (ref 6.0–8.3)

## 2012-08-17 LAB — LIPID PANEL
Cholesterol: 172 mg/dL (ref 0–200)
Total CHOL/HDL Ratio: 3.5 Ratio
Triglycerides: 114 mg/dL (ref <150)
VLDL: 23 mg/dL (ref 0–40)

## 2012-08-17 LAB — T3, FREE: T3, Free: 5.1 pg/mL — ABNORMAL HIGH (ref 2.3–4.2)

## 2012-08-18 ENCOUNTER — Other Ambulatory Visit: Payer: Self-pay | Admitting: Family Medicine

## 2012-08-18 MED ORDER — LIOTHYRONINE SODIUM 5 MCG PO TABS: ORAL_TABLET | ORAL | Status: DC

## 2012-10-05 ENCOUNTER — Ambulatory Visit (INDEPENDENT_AMBULATORY_CARE_PROVIDER_SITE_OTHER): Payer: Medicare PPO | Admitting: Internal Medicine

## 2012-10-05 VITALS — BP 142/70 | HR 57 | Temp 98.0°F | Resp 16 | Ht 60.0 in | Wt 181.0 lb

## 2012-10-05 DIAGNOSIS — F419 Anxiety disorder, unspecified: Secondary | ICD-10-CM

## 2012-10-05 DIAGNOSIS — E039 Hypothyroidism, unspecified: Secondary | ICD-10-CM

## 2012-10-05 LAB — T4, FREE: Free T4: 1.45 ng/dL (ref 0.80–1.80)

## 2012-10-05 LAB — TSH: TSH: 1.762 u[IU]/mL (ref 0.350–4.500)

## 2012-10-05 NOTE — Progress Notes (Signed)
  Subjective:    Patient ID: [name], female    DOB: January 24, 1946, 67 y.o.   MRN: [id number]  HPI See last visit w/ dr Clelia Croft Here for [facility] of med change(Cytomel was decreased about 6 weeks ago) Has no change in any symptoms  Also complain of continued drainage from sebaceous cyst area where removal was accomplished on April 30   Review of Systems Noncontributory    Objective:   Physical Exam BP 142/70  Pulse 57  Temp(Src) 98 F (36.7 C) (Oral)  Resp 16  Ht 5' (1.524 m)  Wt 181 lb (82.101 kg)  BMI 35.35 kg/m2  SpO2 98% No thyromegaly Area where sebaceous cyst removed on left posterior shoulder has a 1 cm suture retained within the wound/purulent material is expressed it is watery approximately 1 cc No cellulitis present The retained sutures removed with forceps       Assessment & Plan:  Hypothyroidism with recent dose change  Check labs

## 2012-10-08 ENCOUNTER — Other Ambulatory Visit: Payer: Self-pay | Admitting: Family Medicine

## 2012-10-09 ENCOUNTER — Other Ambulatory Visit: Payer: Self-pay

## 2012-11-20 ENCOUNTER — Ambulatory Visit (INDEPENDENT_AMBULATORY_CARE_PROVIDER_SITE_OTHER): Payer: Medicare PPO | Admitting: Family Medicine

## 2012-11-20 VITALS — BP 150/90 | HR 53 | Temp 98.0°F | Resp 20 | Ht 60.75 in | Wt 181.0 lb

## 2012-11-20 DIAGNOSIS — J3489 Other specified disorders of nose and nasal sinuses: Secondary | ICD-10-CM

## 2012-11-20 DIAGNOSIS — L723 Sebaceous cyst: Secondary | ICD-10-CM

## 2012-11-20 DIAGNOSIS — R0981 Nasal congestion: Secondary | ICD-10-CM

## 2012-11-20 DIAGNOSIS — J029 Acute pharyngitis, unspecified: Secondary | ICD-10-CM

## 2012-11-20 MED ORDER — IPRATROPIUM BROMIDE 0.03 % NA SOLN: 2.0000 | Freq: Four times a day (QID) | NASAL | Status: DC

## 2012-11-20 MED ORDER — CEFDINIR 300 MG PO CAPS: 300.0000 mg | ORAL_CAPSULE | Freq: Two times a day (BID) | ORAL | Status: DC

## 2012-11-20 MED ORDER — BENZONATATE 100 MG PO CAPS: 100.0000 mg | ORAL_CAPSULE | Freq: Three times a day (TID) | ORAL | Status: DC | PRN

## 2012-11-20 NOTE — Patient Instructions (Addendum)
Use the antibiotic as directed.  If you are not feeling better please let me know!

## 2012-11-20 NOTE — Progress Notes (Signed)
[facility] [address] [place] [address] [phone]  Date:  11/20/2012   Name:  LORIS SEELYE   DOB:  [birth year]   MRN:  [id number]  PCP:  Abbe Amsterdam, MD    Chief Complaint: Nasal Congestion, Cough and Follow-up   History of Present Illness:  [name] is a 67 y.o. very pleasant female patient who presents with the following:  She had a sebaceous cyst removed from the back of her neck in May of this year.  About 6 weeks ago she was in and noted to have a piece of retained suture which was removed.  It drained some material.  It is doing better, but she still notes she can express material from the cyst if she presses on it.    Here today with illness.  She did notice a fever and ST about 4 days ago,  Her temp was about 100.6.  These sx are now better, but she now notes a tickle in her throat and some cough.  Her nose is stuffy.  She did not sleep well last night- she has been too congested to use her CPAP machine.    She feels "wiped out."  She did notice some chills back when she had the fever, but no aches.    Patient Active Problem List   Diagnosis Date Noted  . Essential hypertension, benign 04/28/2012  . Osteoarthritis of left knee 04/28/2012  . Unspecified hypothyroidism 04/28/2012  . Anxiety 11/17/2010  . Sleep apnea 11/17/2010    Past Medical History  Diagnosis Date  . Environmental allergies   . Anemia   . Anxiety   . Osteopenia 02/2012    -1.8 T score right femur neck  . Hypothyroidism   . Sleep apnea   . Vitamin D deficiency     history of  . Obesity   . Interstitial cystitis   . H/O varicella   . History of measles, mumps, or rubella   . Fibroid 02/19/03  . Dysuria 2004  . Menopausal symptoms 2004  . Abnormal perimenopausal bleeding 2005  . Endometrial polyp 2006  . Urinary incontinence 2011  . PMB (postmenopausal bleeding) 06/21/10  . Hypertension 07-19-2010    echo for pre-op EF 55%  normal left wall  thickness LA mildly dialated with mitral and tricuspid regurgatation    Past Surgical History  Procedure Laterality Date  . Myomectomy  1974  . Hysteroscopy  2006 and May 2012    for fibroids, endometrial polyps  . Mouth surgery    . Colonoscopy  12/15/10    History  Substance Use Topics  . Smoking status: Never Smoker   . Smokeless tobacco: Never Used  . Alcohol Use: 0.6 oz/week    1 Glasses of wine per week     Comment: occasional (1-2 times per week)    Family History  Problem Relation Age of Onset  . Colon cancer Paternal Uncle   . Kidney disease Father     from uremia  . Benign prostatic hyperplasia Father   . Clotting disorder Maternal Grandmother     ????  . Hypertension Mother     Allergies  Allergen Reactions  . Bactrim [Sulfamethoxazole W-Trimethoprim]   . Epinephrine     shakey  . Erythromycin   . Sulfa Antibiotics Rash    Medication list has been reviewed and updated.  Current Outpatient Prescriptions on File Prior to Visit  Medication Sig Dispense Refill  . Ascorbic Acid (  VITAMIN C) 1000 MG tablet Take 1,000 mg by mouth daily.        . benazepril-hydrochlorthiazide (LOTENSIN HCT) 20-12.5 MG per tablet Take 2 tablets by mouth daily.  180 tablet  1  . carvedilol (COREG) 6.25 MG tablet TAKE 1 TABLET BY MOUTH TWICE DAILY WITH FOOD (NEED TO MAKE MD APPT.)  180 tablet  1  . Cholecalciferol (VITAMIN D) 2000 UNITS tablet Take 2,000 Units by mouth daily.        . hydrALAZINE (APRESOLINE) 25 MG tablet TAKE 2 TABLETS BY MOUTH EVERY MORNING, TAKE 1 TABLET AT LUNCH TIME AND THEN TAKE 2 TABLETS AT DINNER TIME DAILY  450 tablet  1  . levothyroxine (SYNTHROID, LEVOTHROID) 88 MCG tablet TAKE 1 TABLET BY MOUTH DAILY  90 tablet  1  . liothyronine (CYTOMEL) 5 MCG tablet TAKE 1 TABLET BY MOUTH DAILY  30 tablet  5   No current facility-administered medications on file prior to visit.    Review of Systems:  As per HPI- otherwise negative.   Physical  Examination: Filed Vitals:   11/20/12 1117  BP: 150/90  Pulse: 53  Temp: 98 F (36.7 C)  Resp: 20   Filed Vitals:   11/20/12 1117  Height: 5' 0.75" (1.543 m)  Weight: 181 lb (82.101 kg)   Body mass index is 34.48 kg/(m^2). Ideal Body Weight: Weight in (lb) to have BMI = 25: 131  GEN: WDWN, NAD, Non-toxic, A & O x 3, obese, looks well HEENT: Atraumatic, Normocephalic. Neck supple. No masses, No LAD.  Bilateral TM wnl, oropharynx erythematous.  PEERL,EOMI.   Ears and Nose: No external deformity. CV: RRR, No M/G/R. No JVD. No thrill. No extra heart sounds. PULM: CTA B, no wheezes, crackles, rhonchi. No retractions. No resp. distress. No accessory muscle use. EXTR: No c/c/e NEURO Normal gait.  PSYCH: Normally interactive. Conversant. Not depressed or anxious appearing.  Calm demeanor.  Back: there is a pore that will produce sebaceous material when squeezed.  Otherwise no raised cyst or sign of infection- no erythema, heat or swelling   Results for orders placed in visit on 11/20/12  POCT RAPID STREP A (OFFICE)      Result Value Range   Rapid Strep A Screen Positive (*) Negative     Assessment and Plan: Acute pharyngitis - Plan: POCT rapid strep A, cefdinir (OMNICEF) 300 MG capsule  Nasal congestion - Plan: benzonatate (TESSALON) 100 MG capsule, ipratropium (ATROVENT) 0.03 % nasal spray  Sebaceous cyst  Treat for strep with omnicef.  Let her know I am very sorry about retained suture material, I suspect part broke off during removal.  At this point her cyst is much improved, but still does drain material with pressure.  She had a very large cyst.  offered to have her see surgery.  She would prefer to see how it does, and will let me know if it continues to bother her.    Let me know if throat does not feel better soon!   Signed Abbe Amsterdam, MD

## 2012-12-09 ENCOUNTER — Other Ambulatory Visit: Payer: Self-pay | Admitting: Family Medicine

## 2013-01-06 ENCOUNTER — Ambulatory Visit (INDEPENDENT_AMBULATORY_CARE_PROVIDER_SITE_OTHER): Payer: Medicare PPO | Admitting: Family Medicine

## 2013-01-06 VITALS — BP 130/82 | HR 54 | Temp 97.9°F | Resp 16 | Ht 60.5 in | Wt 184.6 lb

## 2013-01-06 DIAGNOSIS — J029 Acute pharyngitis, unspecified: Secondary | ICD-10-CM

## 2013-01-06 DIAGNOSIS — L700 Acne vulgaris: Secondary | ICD-10-CM

## 2013-01-06 LAB — POCT RAPID STREP A (OFFICE): Rapid Strep A Screen: NEGATIVE

## 2013-01-06 NOTE — Progress Notes (Signed)
[facility]:  Office Visit  Chief Complaint:  Chief Complaint  Patient presents with  . Sore Throat    X 1 week  . Blackhead    Right cheek, X 6 months, says won't go away  . Cough    X 1 week    HPI: [name] is a 67 y.o. female who is here for   Dry cough x 1 week with sore throat, no fevers, chills, SB wheezing. + pain with swallowing.  She had strep in 11/2012. Was on omnicef. Felt better She has had black head on right cheek x 6 months, getting harder. Has tried neutrogena facial wash without releif.   Past Medical History  Diagnosis Date  . Environmental allergies   . Anemia   . Anxiety   . Osteopenia 02/2012    -1.8 T score right femur neck  . Hypothyroidism   . Sleep apnea   . Vitamin D deficiency     history of  . Obesity   . Interstitial cystitis   . H/O varicella   . History of measles, mumps, or rubella   . Fibroid 02/19/03  . Dysuria 2004  . Menopausal symptoms 2004  . Abnormal perimenopausal bleeding 2005  . Endometrial polyp 2006  . Urinary incontinence 2011  . PMB (postmenopausal bleeding) 06/21/10  . Hypertension 07-19-2010    echo for pre-op EF 55%  normal left wall thickness LA mildly dialated with mitral and tricuspid regurgatation   Past Surgical History  Procedure Laterality Date  . Myomectomy  1974  . Hysteroscopy  2006 and May 2012    for fibroids, endometrial polyps  . Mouth surgery    . Colonoscopy  12/15/10   History   Social History  . Marital Status: Divorced    Spouse Name: N/A    Number of Children: N/A  . Years of Education: N/A   Social History Main Topics  . Smoking status: Never Smoker   . Smokeless tobacco: Never Used  . Alcohol Use: 0.6 oz/week    1 Glasses of wine per week     Comment: occasional (1-2 times per week)  . Drug Use: No  . Sexual Activity: No   Other Topics Concern  . None   Social History Narrative   Divorced. Education: [employer]. Exercise: Yoga 2 times a week.    Family History  Problem Relation Age of Onset  . Colon cancer Paternal Uncle   . Kidney disease Father     from uremia  . Benign prostatic hyperplasia Father   . Clotting disorder Maternal Grandmother     ????  . Hypertension Mother    Allergies  Allergen Reactions  . Bactrim [Sulfamethoxazole-Trimethoprim]   . Epinephrine     shakey  . Erythromycin   . Sulfa Antibiotics Rash   Prior to Admission medications   Medication Sig Start Date End Date Taking? Authorizing Provider  Ascorbic Acid (VITAMIN C) 1000 MG tablet Take 1,000 mg by mouth daily.     Yes Historical Provider, MD  benazepril-hydrochlorthiazide (LOTENSIN HCT) 20-12.5 MG per tablet TAKE TWO TABLETS BY MOUTH DAILY 12/09/12  Yes Eleanore E Egan, PA-C  carvedilol (COREG) 6.25 MG tablet TAKE 1 TABLET BY MOUTH TWICE DAILY WITH FOOD (NEED TO MAKE MD APPT.) 08/16/12  Yes Sherren Mocha, MD  Cholecalciferol (VITAMIN D) 2000 UNITS tablet Take 2,000 Units by mouth daily.     Yes Historical Provider, MD  hydrALAZINE (APRESOLINE) 25 MG tablet TAKE 2  TABLETS BY MOUTH EVERY MORNING, TAKE 1 TABLET AT LUNCH TIME AND THEN TAKE 2 TABLETS AT DINNER TIME DAILY 08/16/12  Yes Sherren Mocha, MD  levothyroxine (SYNTHROID, LEVOTHROID) 88 MCG tablet TAKE 1 TABLET BY MOUTH DAILY 08/16/12  Yes Sherren Mocha, MD  liothyronine (CYTOMEL) 5 MCG tablet TAKE 1 TABLET BY MOUTH DAILY 10/08/12  Yes Sherren Mocha, MD  benzonatate (TESSALON) 100 MG capsule Take 1 capsule (100 mg total) by mouth 3 (three) times daily as needed for cough. 11/20/12   Gwenlyn Found Copland, MD  cefdinir (OMNICEF) 300 MG capsule Take 1 capsule (300 mg total) by mouth 2 (two) times daily. 11/20/12   Gwenlyn Found Copland, MD  ipratropium (ATROVENT) 0.03 % nasal spray Place 2 sprays into the nose 4 (four) times daily. 11/20/12   Gwenlyn Found Copland, MD     ROS: The patient denies fevers, chills, night sweats, unintentional weight loss, chest pain, palpitations, wheezing, dyspnea on exertion, nausea, vomiting,  abdominal pain, dysuria, hematuria, melena, numbness, weakness, or tingling.  All other systems have been reviewed and were otherwise negative with the exception of those mentioned in the HPI and as above.    PHYSICAL EXAM: Filed Vitals:   01/06/13 1137  BP: 130/82  Pulse: 54  Temp: 97.9 F (36.6 C)  Resp: 16   Filed Vitals:   01/06/13 1137  Height: 5' 0.5" (1.537 m)  Weight: 184 lb 9.6 oz (83.734 kg)   Body mass index is 35.44 kg/(m^2).  General: Alert, no acute distress HEENT:  Normocephalic, atraumatic, oropharynx patent. EOMI, PERRLA. + minimal erythematous right tonsil, no exudates. + blackhead Cardiovascular:  Regular rate and rhythm, no rubs murmurs or gallops.  No Carotid bruits, radial pulse intact. No pedal edema.  Respiratory: Clear to auscultation bilaterally.  No wheezes, rales, or rhonchi.  No cyanosis, no use of accessory musculature GI: No organomegaly, abdomen is soft and non-tender, positive bowel sounds.  No masses. Skin: No rashes. + large right cheek blackhead Neurologic: Facial musculature symmetric. Psychiatric: Patient is appropriate throughout our interaction. Lymphatic: No cervical lymphadenopathy Musculoskeletal: Gait intact.   LABS: Results for orders placed in visit on 01/06/13  POCT RAPID STREP A (OFFICE)      Result Value Range   Rapid Strep A Screen Negative  Negative     EKG/XRAY:   Primary read interpreted by Dr. Conley Rolls at [facility].   ASSESSMENT/PLAN: Encounter Diagnoses  Name Primary?  . Acute pharyngitis Yes  . Black head    Successful removal of blackhead Rx Cepachol Throat culture pending Sxs treatment, if no improvement and feels worse then will give abx ie amoxacillin F/u prn  Gross sideeffects, risk and benefits, and alternatives of medications d/w patient. Patient is aware that all medications have potential sideeffects and we are unable to predict every sideeffect or drug-drug interaction that may occur.  LE, THAO PHUONG,  DO 01/06/2013 2:11 PM

## 2013-01-06 NOTE — Patient Instructions (Signed)
Viral and Bacterial Pharyngitis Pharyngitis is soreness (inflammation) or infection of the pharynx. It is also called a sore throat. CAUSES  Most sore throats are caused by viruses and are part of a cold. However, some sore throats are caused by strep and other bacteria. Sore throats can also be caused by post nasal drip from draining sinuses, allergies and sometimes from sleeping with an open mouth. Infectious sore throats can be spread from person to person by coughing, sneezing and sharing cups or eating utensils. TREATMENT  Sore throats that are viral usually last 3-4 days. Viral illness will get better without medications (antibiotics). Strep throat and other bacterial infections will usually begin to get better about 24-48 hours after you begin to take antibiotics. HOME CARE INSTRUCTIONS   If the caregiver feels there is a bacterial infection or if there is a positive strep test, they will prescribe an antibiotic. The full course of antibiotics must be taken. If the full course of antibiotic is not taken, you or your child may become ill again. If you or your child has strep throat and do not finish all of the medication, serious heart or kidney diseases may develop.  Drink enough water and fluids to keep your urine clear or pale yellow.  Only take over-the-counter or prescription medicines for pain, discomfort or fever as directed by your caregiver.  Get lots of rest.  Gargle with salt water ( tsp. of salt in a glass of water) as often as every 1-2 hours as you need for comfort.  Hard candies may soothe the throat if individual is not at risk for choking. Throat sprays or lozenges may also be used. SEEK MEDICAL CARE IF:   Large, tender lumps in the neck develop.  A rash develops.  Green, yellow-brown or bloody sputum is coughed up.  Your baby is older than 3 months with a rectal temperature of 100.5 F (38.1 C) or higher for more than 1 day. SEEK IMMEDIATE MEDICAL CARE IF:   A  stiff neck develops.  You or your child are drooling or unable to swallow liquids.  You or your child are vomiting, unable to keep medications or liquids down.  You or your child has severe pain, unrelieved with recommended medications.  You or your child are having difficulty breathing (not due to stuffy nose).  You or your child are unable to fully open your mouth.  You or your child develop redness, swelling, or severe pain anywhere on the neck.  You have a fever.  Your baby is older than 3 months with a rectal temperature of 102 F (38.9 C) or higher.  Your baby is 3 months old or younger with a rectal temperature of 100.4 F (38 C) or higher. MAKE SURE YOU:   Understand these instructions.  Will watch your condition.  Will get help right away if you are not doing well or get worse. Document Released: 02/20/2005 Document Revised: 05/15/2011 Document Reviewed: 05/20/2007 Exit[employer] Patient Information 2014 [employer].  

## 2013-01-09 ENCOUNTER — Other Ambulatory Visit: Payer: Self-pay

## 2013-01-09 LAB — CULTURE, GROUP A STREP: Organism ID, Bacteria: NORMAL

## 2013-03-14 ENCOUNTER — Ambulatory Visit (INDEPENDENT_AMBULATORY_CARE_PROVIDER_SITE_OTHER): Payer: Medicare PPO | Admitting: Internal Medicine

## 2013-03-14 VITALS — BP 138/68 | HR 80 | Temp 99.8°F | Resp 18 | Ht 62.0 in | Wt 182.0 lb

## 2013-03-14 DIAGNOSIS — R059 Cough, unspecified: Secondary | ICD-10-CM

## 2013-03-14 DIAGNOSIS — R509 Fever, unspecified: Secondary | ICD-10-CM

## 2013-03-14 DIAGNOSIS — R05 Cough: Secondary | ICD-10-CM

## 2013-03-14 LAB — POCT INFLUENZA A/B
INFLUENZA A, POC: NEGATIVE
Influenza B, POC: NEGATIVE

## 2013-03-14 MED ORDER — BENZONATATE 100 MG PO CAPS: 100.0000 mg | ORAL_CAPSULE | Freq: Two times a day (BID) | ORAL | Status: DC | PRN

## 2013-03-14 NOTE — Progress Notes (Addendum)
° °  Subjective:    Patient ID: [name], female    DOB: 06-12-1945, 68 y.o.   MRN: [id number] This chart was scribed for Tami Lin, MD by Anastasia Pall, ED Scribe. This patient was seen in room 10 and the patient's care was started at 5:58 PM.  Chief Complaint  Patient presents with   Cough    4 days   Sore Throat    HPI [name] is a 68 y.o. female who presents to the [facility] complaining of an unproductive cough, onset 4 days ago. She denies her cough keeping her up at night, but reports some trouble falling asleep due to sore throat. She reports using Cpap to sleep at night. She reports having a max temperature of 99.8, but denies feeling feverish. She denies body aches and any other associated symptoms. She denies h/o chest problems.    PCP Lamar Blinks, MD  Patient Active Problem List   Diagnosis Date Noted   Essential hypertension, benign 04/28/2012   Osteoarthritis of left knee 04/28/2012   Unspecified hypothyroidism 04/28/2012   Anxiety 11/17/2010   Sleep apnea 11/17/2010    Review of Systems  Constitutional: Positive for fever.  HENT: Positive for sore throat.   Respiratory: Positive for cough (unproductive).   Cardiovascular: Negative for chest pain.  Musculoskeletal: Negative for myalgias.       Objective:   Physical Exam  Nursing note and vitals reviewed. Constitutional: She is oriented to person, place, and time. She appears well-developed and well-nourished. No distress.  HENT:  Head: Normocephalic and atraumatic.  Right Ear: External ear normal.  Left Ear: External ear normal.  Nose: Nose normal.  Mouth/Throat: Oropharynx is clear and moist.  Eyes: Conjunctivae and EOM are normal. Pupils are equal, round, and reactive to light.  Neck: Neck supple.  Cardiovascular: Normal rate and regular rhythm.   Pulmonary/Chest: Effort normal and breath sounds normal. No respiratory distress. She has no wheezes.  Musculoskeletal:  Normal range of motion.  Lymphadenopathy:    She has no cervical adenopathy.  Neurological: She is alert and oriented to person, place, and time.  Skin: Skin is warm and dry.  Psychiatric: She has a normal mood and affect. Her behavior is normal.   Results for orders placed in visit on 03/14/13  POCT INFLUENZA A/B      Result Value Range   Influenza A, POC Negative     Influenza B, POC Negative       BP 138/68   Pulse 80   Temp(Src) 99.8 F (37.7 C)   Resp 18   Ht 5\' 2"  (1.575 m)   Wt 182 lb (82.555 kg)   BMI 33.28 kg/m2   SpO2 98%     Assessment & Plan:  Cough with fever likely viral  Tessalon pearls She does not like cough medicines that make her sleepy Followup if fever not resolved in 3 days or if worse      I have completed the patient encounter in its entirety as documented by the scribe, with editing by me where necessary. Robert P. Laney Pastor, M.D.

## 2013-03-26 ENCOUNTER — Other Ambulatory Visit: Payer: Self-pay | Admitting: Family Medicine

## 2013-03-26 NOTE — Telephone Encounter (Signed)
Dr Brigitte Pulse, I wanted to check w/you before sending in RF d/t warning that is twice recommended max dosage. Please review and send if OK.

## 2013-03-31 ENCOUNTER — Telehealth: Payer: Self-pay

## 2013-03-31 NOTE — Telephone Encounter (Signed)
PT STATES SHE WAS ONLY GIVEN A MONTH'S SUPPLY OF HER MEDS INSTEAD OF 3 MONTHS WHICH SHE USUALLY GET. PLEASE CALL [phone]

## 2013-03-31 NOTE — Telephone Encounter (Signed)
Pt is going to be schedule a PE with Dr. Brigitte Pulse in May. Can she have enough refills until then?

## 2013-04-01 MED ORDER — LIOTHYRONINE SODIUM 5 MCG PO TABS: 5.0000 ug | ORAL_TABLET | Freq: Every day | ORAL | Status: DC

## 2013-04-01 MED ORDER — LEVOTHYROXINE SODIUM 88 MCG PO TABS: 88.0000 ug | ORAL_TABLET | Freq: Every day | ORAL | Status: DC

## 2013-04-01 NOTE — Telephone Encounter (Signed)
What exactly does pt need?  I changed her rxs for levothyroxine and cytomel which were sent in on 1/21 for #30 tabs to #90 tabs but looks like lotensin-hctz was already sent in for 90d supply on 1/21 and then it looks like she should have run out of her coreg and hydrazaline on 02/15/13 - about 6 wks ago.  Is she still taking them as prescribed? Has she missed a lot or changed the frequency? Or had refills on a prior old rx maybe?  If she is still taking coreg and hydrazaline as rx'ed ok to refill these for 90d as well.    Also, pt will need a 4 month supply to get to her OV in may so if she hasn't picked up the prior 30d rxs rec that she do so in addition to the 90d so she has enough to get her to her appt and ok to put a refill on BP meds of lotensin-hctz, coreg, and hydalazine - clear as mud?  (and this is why this should really be done in an office visit ;) ).   BP meds can lower potassium and pt's potassium has always been running borderline low in the past - which is one of the reasons she should really get labs every 6 mos.  Make sure she is eating a high potassium diet - high potassium foods are: Bran cereals and other bran products, Milk (skim, 1%), Yogurt, Avocados, Bananas, Dried fruits, Kiwis, Oranges, Prunes, Raisins, Baked beans, Spinach, Tomatoes, Peanut butter, Nuts, and Tofu to avoid muscle aches. Do NOT take otc potassium supplements w/o close lab monitoring as overdose can lead to deadly heart arrhythmias.

## 2013-04-01 NOTE — Telephone Encounter (Signed)
With pt's chronic medical problems, she really will expect to need office visits and lab work 2-3 times/yr - every 6 mos at most and most pt's with severity of HTN I would recommend OV every 4 months.  Since I will be out on maternity leave and she cannot schedule earlier, as well as her BP has been borderline - but at least not to high - on several acute visits - ok to refill meds x 3 additional mos this one time.  However, in the future she should try to have this addressed AT A VISIT so the appropriate labs can be done.

## 2013-04-30 ENCOUNTER — Ambulatory Visit (INDEPENDENT_AMBULATORY_CARE_PROVIDER_SITE_OTHER): Payer: Medicare PPO | Admitting: Family Medicine

## 2013-04-30 VITALS — BP 128/88 | HR 71 | Temp 98.4°F | Resp 17 | Ht 60.5 in | Wt 181.0 lb

## 2013-04-30 DIAGNOSIS — E039 Hypothyroidism, unspecified: Secondary | ICD-10-CM

## 2013-04-30 DIAGNOSIS — F411 Generalized anxiety disorder: Secondary | ICD-10-CM

## 2013-04-30 DIAGNOSIS — I10 Essential (primary) hypertension: Secondary | ICD-10-CM

## 2013-04-30 DIAGNOSIS — F419 Anxiety disorder, unspecified: Secondary | ICD-10-CM

## 2013-04-30 LAB — T4, FREE: FREE T4: 1.71 ng/dL (ref 0.80–1.80)

## 2013-04-30 LAB — COMPREHENSIVE METABOLIC PANEL
ALT: 13 U/L (ref 0–35)
AST: 14 U/L (ref 0–37)
Albumin: 4.5 g/dL (ref 3.5–5.2)
Alkaline Phosphatase: 52 U/L (ref 39–117)
BILIRUBIN TOTAL: 0.5 mg/dL (ref 0.2–1.2)
BUN: 14 mg/dL (ref 6–23)
CO2: 31 meq/L (ref 19–32)
Calcium: 9.6 mg/dL (ref 8.4–10.5)
Chloride: 99 mEq/L (ref 96–112)
Creat: 0.66 mg/dL (ref 0.50–1.10)
GLUCOSE: 92 mg/dL (ref 70–99)
Potassium: 3.9 mEq/L (ref 3.5–5.3)
Sodium: 141 mEq/L (ref 135–145)
Total Protein: 7 g/dL (ref 6.0–8.3)

## 2013-04-30 LAB — T3, FREE: T3 FREE: 3.4 pg/mL (ref 2.3–4.2)

## 2013-04-30 LAB — TSH: TSH: 2.83 u[IU]/mL (ref 0.350–4.500)

## 2013-04-30 MED ORDER — LEVOTHYROXINE SODIUM 88 MCG PO TABS: ORAL_TABLET | ORAL | Status: DC

## 2013-04-30 MED ORDER — HYDRALAZINE HCL 25 MG PO TABS: ORAL_TABLET | ORAL | Status: DC

## 2013-04-30 MED ORDER — BENAZEPRIL-HYDROCHLOROTHIAZIDE 20-12.5 MG PO TABS: ORAL_TABLET | ORAL | Status: DC

## 2013-04-30 MED ORDER — LIOTHYRONINE SODIUM 5 MCG PO TABS: ORAL_TABLET | ORAL | Status: DC

## 2013-04-30 MED ORDER — CARVEDILOL 6.25 MG PO TABS: ORAL_TABLET | ORAL | Status: DC

## 2013-04-30 NOTE — Patient Instructions (Addendum)
Continue current medications.  If headaches and dizziness persist please return  If the skin lesion on the left eyebrow changes please return to have it taken off  Your labs will be drawn today and we will let you know if anything needs to be changed based on them.  Debrox as directed on the box for ears. This can be purchased over-the-counter.  Plan to return as scheduled for your physical

## 2013-04-30 NOTE — Progress Notes (Signed)
Subjective: Patient has a skin lesion on her left eyebrow she is concerned about. It is been there may be several months. She also has a seborrheic keratosis on her right temple which has been frozen off before. It did not go away completely, and returned looking darker.  She's been having some fleeting headaches lately, however several days. She also has had a little dizziness. She wonders whether some of this is from being trapped indoors with the weather.  She's been on thyroid medication for many years, and the dosage of the Cytomel was adjusted last summer.  She's been on a pressure medications for a long time also, does not check them elsewhere. No major complaints.  Objective: Pleasant lady in no major distress. No carotid bruits. Chest clear. Heart regular without murmurs. Abdomen soft without mass or tenderness. Cerumen impaction both ears. Throat clear. No thyromegaly.  Assessment: Hypothyroidism Hypertension Seborrheic keratosis, left eyebrow and right temple Cerumen impaction of ears Headaches and dizziness  Plan: Debrox Refills of medications. Patient prefers them to be printed.  Check

## 2013-05-01 ENCOUNTER — Encounter: Payer: Self-pay | Admitting: Family Medicine

## 2013-05-07 ENCOUNTER — Telehealth: Payer: Self-pay

## 2013-05-07 NOTE — Telephone Encounter (Signed)
Patient calling for lab results and to find out if she needs to change medications.   Please call [phone]

## 2013-05-07 NOTE — Telephone Encounter (Signed)
Please review labs. 

## 2013-05-09 NOTE — Telephone Encounter (Signed)
Labs were good.  I sent her a lab letter 10 days ago?

## 2013-05-09 NOTE — Telephone Encounter (Signed)
Pt got letter yesterday. No further questions

## 2013-05-24 ENCOUNTER — Ambulatory Visit (INDEPENDENT_AMBULATORY_CARE_PROVIDER_SITE_OTHER): Payer: Medicare PPO | Admitting: Emergency Medicine

## 2013-05-24 ENCOUNTER — Ambulatory Visit: Payer: Medicare PPO

## 2013-05-24 VITALS — BP 132/82 | HR 64 | Temp 98.2°F | Resp 18 | Ht 60.0 in | Wt 181.0 lb

## 2013-05-24 DIAGNOSIS — S335XXA Sprain of ligaments of lumbar spine, initial encounter: Secondary | ICD-10-CM

## 2013-05-24 DIAGNOSIS — S20219A Contusion of unspecified front wall of thorax, initial encounter: Secondary | ICD-10-CM

## 2013-05-24 DIAGNOSIS — S139XXA Sprain of joints and ligaments of unspecified parts of neck, initial encounter: Secondary | ICD-10-CM

## 2013-05-24 DIAGNOSIS — M542 Cervicalgia: Secondary | ICD-10-CM

## 2013-05-24 LAB — POCT URINALYSIS DIPSTICK
Bilirubin, UA: NEGATIVE
Glucose, UA: NEGATIVE
Ketones, UA: NEGATIVE
Leukocytes, UA: NEGATIVE
NITRITE UA: NEGATIVE
RBC UA: NEGATIVE
SPEC GRAV UA: 1.025
UROBILINOGEN UA: 0.2
pH, UA: 5.5

## 2013-05-24 MED ORDER — CYCLOBENZAPRINE HCL 5 MG PO TABS: 5.0000 mg | ORAL_TABLET | Freq: Three times a day (TID) | ORAL | Status: DC | PRN

## 2013-05-24 MED ORDER — MELOXICAM 15 MG PO TABS: 15.0000 mg | ORAL_TABLET | Freq: Every day | ORAL | Status: DC

## 2013-05-24 NOTE — Patient Instructions (Signed)
Lumbosacral Strain Lumbosacral strain is a strain of any of the parts that make up your lumbosacral vertebrae. Your lumbosacral vertebrae are the bones that make up the lower third of your backbone. Your lumbosacral vertebrae are held together by muscles and tough, fibrous tissue (ligaments).  CAUSES  A sudden blow to your back can cause lumbosacral strain. Also, anything that causes an excessive stretch of the muscles in the low back can cause this strain. This is typically seen when people exert themselves strenuously, fall, lift heavy objects, bend, or crouch repeatedly. RISK FACTORS  Physically demanding work.  Participation in pushing or pulling sports or sports that require sudden twist of the back (tennis, golf, baseball).  Weight lifting.  Excessive lower back curvature.  Forward-tilted pelvis.  Weak back or abdominal muscles or both.  Tight hamstrings. SIGNS AND SYMPTOMS  Lumbosacral strain may cause pain in the area of your injury or pain that moves (radiates) down your leg.  DIAGNOSIS Your health care provider can often diagnose lumbosacral strain through a physical exam. In some cases, you may need tests such as X-ray exams.  TREATMENT  Treatment for your lower back injury depends on many factors that your clinician will have to evaluate. However, most treatment will include the use of anti-inflammatory medicines. HOME CARE INSTRUCTIONS   Avoid hard physical activities (tennis, racquetball, waterskiing) if you are not in proper physical condition for it. This may aggravate or create problems.  If you have a back problem, avoid sports requiring sudden body movements. Swimming and walking are generally safer activities.  Maintain good posture.  Maintain a healthy weight.  For acute conditions, you may put ice on the injured area.  Put ice in a plastic bag.  Place a towel between your skin and the bag.  Leave the ice on for 20 minutes, 2 3 times a day.  When the  low back starts healing, stretching and strengthening exercises may be recommended. SEEK MEDICAL CARE IF:  Your back pain is getting worse.  You experience severe back pain not relieved with medicines. SEEK IMMEDIATE MEDICAL CARE IF:   You have numbness, tingling, weakness, or problems with the use of your arms or legs.  There is a change in bowel or bladder control.  You have increasing pain in any area of the body, including your belly (abdomen).  You notice shortness of breath, dizziness, or feel faint.  You feel sick to your stomach (nauseous), are throwing up (vomiting), or become sweaty.  You notice discoloration of your toes or legs, or your feet get very cold. MAKE SURE YOU:   Understand these instructions.  Will watch your condition.  Will get help right away if you are not doing well or get worse. Document Released: 11/30/2004 Document Revised: 12/11/2012 Document Reviewed: 10/09/2012 [facility] Patient Information 2014 [place], [place]. Cervical Sprain A cervical sprain is an injury in the neck in which the strong, fibrous tissues (ligaments) that connect your neck bones stretch or tear. Cervical sprains can range from mild to severe. Severe cervical sprains can cause the neck vertebrae to be unstable. This can lead to damage of the spinal cord and can result in serious nervous system problems. The amount of time it takes for a cervical sprain to get better depends on the cause and extent of the injury. Most cervical sprains heal in 1 to 3 weeks. CAUSES  Severe cervical sprains may be caused by:   Contact sport injuries (such as from football, rugby, wrestling, hockey, auto  racing, gymnastics, diving, martial arts, or boxing).   Motor vehicle collisions.   Whiplash injuries. This is an injury from a sudden forward-and backward whipping movement of the head and neck.  Falls.  Mild cervical sprains may be caused by:   Being in an awkward position, such as while  cradling a telephone between your ear and shoulder.   Sitting in a chair that does not offer proper support.   Working at a poorly designed computer station.   Looking up or down for long periods of time.  SYMPTOMS   Pain, soreness, stiffness, or a burning sensation in the front, back, or sides of the neck. This discomfort may develop immediately after the injury or slowly, 24 hours or more after the injury.   Pain or tenderness directly in the middle of the back of the neck.   Shoulder or upper back pain.   Limited ability to move the neck.   Headache.   Dizziness.   Weakness, numbness, or tingling in the hands or arms.   Muscle spasms.   Difficulty swallowing or chewing.   Tenderness and swelling of the neck.  DIAGNOSIS  Most of the time your health care provider can diagnose a cervical sprain by taking your history and doing a physical exam. Your health care provider will ask about previous neck injuries and any known neck problems, such as arthritis in the neck. X-rays may be taken to find out if there are any other problems, such as with the bones of the neck. Other tests, such as a CT scan or MRI, may also be needed.  TREATMENT  Treatment depends on the severity of the cervical sprain. Mild sprains can be treated with rest, keeping the neck in place (immobilization), and pain medicines. Severe cervical sprains are immediately immobilized. Further treatment is done to help with pain, muscle spasms, and other symptoms and may include:  Medicines, such as pain relievers, numbing medicines, or muscle relaxants.   Physical therapy. This may involve stretching exercises, strengthening exercises, and posture training. Exercises and improved posture can help stabilize the neck, strengthen muscles, and help stop symptoms from returning.  HOME CARE INSTRUCTIONS   Put ice on the injured area.   Put ice in a plastic bag.   Place a towel between your skin and the  bag.   Leave the ice on for 15 20 minutes, 3 4 times a day.   If your injury was severe, you may have been given a cervical collar to wear. A cervical collar is a two-piece collar designed to keep your neck from moving while it heals.  Do not remove the collar unless instructed by your health care provider.  If you have long hair, keep it outside of the collar.  Ask your health care provider before making any adjustments to your collar. Minor adjustments may be required over time to improve comfort and reduce pressure on your chin or on the back of your head.  Ifyou are allowed to remove the collar for cleaning or bathing, follow your health care provider's instructions on how to do so safely.  Keep your collar clean by wiping it with mild soap and water and drying it completely. If the collar you have been given includes removable pads, remove them every 1 2 days and hand wash them with soap and water. Allow them to air dry. They should be completely dry before you wear them in the collar.  If you are allowed to remove the collar   for cleaning and bathing, wash and dry the skin of your neck. Check your skin for irritation or sores. If you see any, tell your health care provider.  Do not drive while wearing the collar.   Only take over-the-counter or prescription medicines for pain, discomfort, or fever as directed by your health care provider.   Keep all follow-up appointments as directed by your health care provider.   Keep all physical therapy appointments as directed by your health care provider.   Make any needed adjustments to your workstation to promote good posture.   Avoid positions and activities that make your symptoms worse.   Warm up and stretch before being active to help prevent problems.  SEEK MEDICAL CARE IF:   Your pain is not controlled with medicine.   You are unable to decrease your pain medicine over time as planned.   Your activity level is not  improving as expected.  SEEK IMMEDIATE MEDICAL CARE IF:   You develop any bleeding.  You develop stomach upset.  You have signs of an allergic reaction to your medicine.   Your symptoms get worse.   You develop new, unexplained symptoms.   You have numbness, tingling, weakness, or paralysis in any part of your body.  MAKE SURE YOU:   Understand these instructions.  Will watch your condition.  Will get help right away if you are not doing well or get worse. Document Released: 12/18/2006 Document Revised: 12/11/2012 Document Reviewed: 08/28/2012 [facility] Patient Information 2014 [place].
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
## 2013-06-19 ENCOUNTER — Telehealth: Payer: Self-pay

## 2013-06-19 DIAGNOSIS — M549 Dorsalgia, unspecified: Secondary | ICD-10-CM

## 2013-06-19 NOTE — Telephone Encounter (Signed)
Patient complains of back back and neck pain. Patient request a referral to see a specialist.

## 2013-06-19 NOTE — Telephone Encounter (Signed)
sure

## 2013-06-19 NOTE — Telephone Encounter (Signed)
Pt continues to have pain since last OV. Do you recommend an ortho referral?

## 2013-06-19 NOTE — Telephone Encounter (Signed)
Spoke to patient.  Advised that we would be referring her to ortho and they would call her with an appointment.

## 2013-06-24 ENCOUNTER — Telehealth: Payer: Self-pay

## 2013-06-24 NOTE — Telephone Encounter (Signed)
PT HAVE AN APPT WITH ANOTHER DR ON Friday AND NEED TO COME BY AND PICK UP HER XRAYS OF THE BACK AND NECK. Marland Kitchen PLEASE CALL [phone]

## 2013-06-25 NOTE — Telephone Encounter (Signed)
Pt has been notified.

## 2013-08-18 ENCOUNTER — Other Ambulatory Visit: Payer: Self-pay | Admitting: Family Medicine

## 2013-08-18 ENCOUNTER — Encounter: Payer: Self-pay | Admitting: Family Medicine

## 2013-08-18 ENCOUNTER — Ambulatory Visit (INDEPENDENT_AMBULATORY_CARE_PROVIDER_SITE_OTHER): Payer: Medicare PPO | Admitting: Family Medicine

## 2013-08-18 VITALS — BP 140/80 | HR 54 | Temp 98.5°F | Resp 16 | Ht 60.5 in | Wt 184.2 lb

## 2013-08-18 DIAGNOSIS — I10 Essential (primary) hypertension: Secondary | ICD-10-CM

## 2013-08-18 DIAGNOSIS — E039 Hypothyroidism, unspecified: Secondary | ICD-10-CM

## 2013-08-18 DIAGNOSIS — Z0000 Encounter for general adult medical examination without abnormal findings: Secondary | ICD-10-CM

## 2013-08-18 DIAGNOSIS — M810 Age-related osteoporosis without current pathological fracture: Secondary | ICD-10-CM

## 2013-08-18 LAB — TSH: TSH: 1.906 u[IU]/mL (ref 0.350–4.500)

## 2013-08-18 LAB — LIPID PANEL
CHOL/HDL RATIO: 3.9 ratio
CHOLESTEROL: 181 mg/dL (ref 0–200)
HDL: 47 mg/dL (ref >39)
LDL Cholesterol: 105 mg/dL — ABNORMAL HIGH (ref 0–99)
Triglycerides: 146 mg/dL (ref <150)
VLDL: 29 mg/dL (ref 0–40)

## 2013-08-18 MED ORDER — LEVOTHYROXINE SODIUM 88 MCG PO TABS: ORAL_TABLET | ORAL | Status: DC

## 2013-08-18 MED ORDER — LIOTHYRONINE SODIUM 5 MCG PO TABS: ORAL_TABLET | ORAL | Status: DC

## 2013-08-18 MED ORDER — BENAZEPRIL-HYDROCHLOROTHIAZIDE 20-12.5 MG PO TABS: ORAL_TABLET | ORAL | Status: DC

## 2013-08-18 MED ORDER — HYDRALAZINE HCL 25 MG PO TABS: ORAL_TABLET | ORAL | Status: DC

## 2013-08-18 NOTE — Progress Notes (Signed)
[facility] [address]  [place] [address] [phone]  Date:  08/18/2013   Name:  [name]   DOB:  22-Sep-1945   MRN:  [id number]  PCP:  Lamar Blinks, MD    Chief Complaint: Annual Exam and Medication Refill   History of Present Illness:  [name] is a 68 y.o. very pleasant female patient who presents with the following:  Here today for a CPE and to rechek her HTN today.  She also does have osteopenia- stopped her fosamax about 2 years ago and would like to have a  DEXA soon to see how she is doing. She is fasting today for labs. Otherwise she does not have any particular complaints today.  She does not desire a pap today- her last was about 3 years ago and was normal.    Patient Active Problem List   Diagnosis Date Noted  . Essential hypertension, benign 04/28/2012  . Osteoarthritis of left knee 04/28/2012  . Unspecified hypothyroidism 04/28/2012  . Anxiety 11/17/2010  . Sleep apnea 11/17/2010    Past Medical History  Diagnosis Date  . Environmental allergies   . Anemia   . Anxiety   . Osteopenia 02/2012    -1.8 T score right femur neck  . Hypothyroidism   . Sleep apnea   . Vitamin D deficiency     history of  . Obesity   . Interstitial cystitis   . H/O varicella   . History of measles, mumps, or rubella   . Fibroid 02/19/03  . Dysuria 2004  . Menopausal symptoms 2004  . Abnormal perimenopausal bleeding 2005  . Endometrial polyp 2006  . Urinary incontinence 2011  . PMB (postmenopausal bleeding) 06/21/10  . Hypertension 07-19-2010    echo for pre-op EF 55%  normal left wall thickness LA mildly dialated with mitral and tricuspid regurgatation  . Allergy     Past Surgical History  Procedure Laterality Date  . Myomectomy  1974  . Hysteroscopy  2006 and May 2012    for fibroids, endometrial polyps  . Mouth surgery    . Colonoscopy  12/15/10    History  Substance Use Topics  . Smoking status: Never Smoker   .  Smokeless tobacco: Never Used  . Alcohol Use: 0.6 oz/week    1 Glasses of wine per week     Comment: occasional (1-2 times per week)    Family History  Problem Relation Age of Onset  . Colon cancer Paternal Uncle   . Kidney disease Father     from uremia  . Benign prostatic hyperplasia Father   . Clotting disorder Maternal Grandmother     ????  . Hypertension Mother     Allergies  Allergen Reactions  . Bactrim [Sulfamethoxazole-Trimethoprim]   . Epinephrine     shakey  . Erythromycin   . Sulfa Antibiotics Rash    Medication list has been reviewed and updated.  Current Outpatient Prescriptions on File Prior to Visit  Medication Sig Dispense Refill  . benazepril-hydrochlorthiazide (LOTENSIN HCT) 20-12.5 MG per tablet Take one twice daily for blood pressure  180 tablet  1  . carvedilol (COREG) 6.25 MG tablet Take one twice daily with food for blood pressure  180 tablet  1  . Cholecalciferol (VITAMIN D) 2000 UNITS tablet Take 2,000 Units by mouth daily.        . hydrALAZINE (APRESOLINE) 25 MG tablet Take 2 in the morning, 1 at lunch, and 2 at  dinner for blood pressure  450 tablet  1  . levothyroxine (SYNTHROID, LEVOTHROID) 88 MCG tablet Take one daily for thyroid  90 tablet  1  . liothyronine (CYTOMEL) 5 MCG tablet Take one daily for thyroid  90 tablet  1  . Ascorbic Acid (VITAMIN C) 1000 MG tablet Take 1,000 mg by mouth daily.        . benzonatate (TESSALON) 100 MG capsule Take 1 capsule (100 mg total) by mouth 2 (two) times daily as needed for cough.  20 capsule  0  . cyclobenzaprine (FLEXERIL) 5 MG tablet Take 1 tablet (5 mg total) by mouth 3 (three) times daily as needed for muscle spasms.  30 tablet  0  . ipratropium (ATROVENT) 0.03 % nasal spray Place 2 sprays into the nose 4 (four) times daily.  30 mL  6  . meloxicam (MOBIC) 15 MG tablet Take 1 tablet (15 mg total) by mouth daily.  30 tablet  0   No current facility-administered medications on file prior to visit.     Review of Systems:  As per HPI- otherwise negative.   Physical Examination: Filed Vitals:   08/18/13 0851  BP: 140/80  Pulse: 54  Temp: 98.5 F (36.9 C)  Resp: 16   Filed Vitals:   08/18/13 0851  Height: 5' 0.5" (1.537 m)  Weight: 184 lb 3.2 oz (83.553 kg)   Body mass index is 35.37 kg/(m^2). Ideal Body Weight: Weight in (lb) to have BMI = 25: 129.9  GEN: WDWN, NAD, Non-toxic, A & O x 3, obese, looks well HEENT: Atraumatic, Normocephalic. Neck supple. No masses, No LAD. Ears and Nose: No external deformity. CV: RRR, No M/G/R. No JVD. No thrill. No extra heart sounds. PULM: CTA B, no wheezes, crackles, rhonchi. No retractions. No resp. distress. No accessory muscle use. ABD: S, NT, ND, +BS. No rebound. No HSM. EXTR: No c/c/e NEURO Normal gait.  PSYCH: Normally interactive. Conversant. Not depressed or anxious appearing.  Calm demeanor.  Breast: normal exam, no masses, discharge or dimpling   Assessment and Plan: Unspecified hypothyroidism - Plan: liothyronine (CYTOMEL) 5 MCG tablet, levothyroxine (SYNTHROID, LEVOTHROID) 88 MCG tablet, TSH  Essential hypertension, benign - Plan: hydrALAZINE (APRESOLINE) 25 MG tablet, benazepril-hydrochlorthiazide (LOTENSIN HCT) 20-12.5 MG per tablet, Lipid panel, POCT CBC  Routine general medical examination at a health care facility - Plan: Lipid panel, POCT CBC, Vitamin D, 25-hydroxy, TSH  Osteoporosis, unspecified - Plan: HM DEXA SCAN   Refilled her medications as above.  Schedule dexa and check vitamin D TSH pending for dx of hypothyroidism   Signed Lamar Blinks, MD

## 2013-08-18 NOTE — Addendum Note (Signed)
Addended by: Lamar Blinks C on: 08/18/2013 12:33 PM   Modules accepted: Orders

## 2013-08-19 LAB — CBC WITH DIFFERENTIAL/PLATELET
BASOS ABS: 0 10*3/uL (ref 0.0–0.1)
Basophils Relative: 0 % (ref 0–1)
Eosinophils Absolute: 0.3 10*3/uL (ref 0.0–0.7)
Eosinophils Relative: 3 % (ref 0–5)
HCT: 40.6 % (ref 36.0–46.0)
Hemoglobin: 13.5 g/dL (ref 12.0–15.0)
Lymphocytes Relative: 23 % (ref 12–46)
Lymphs Abs: 2.1 10*3/uL (ref 0.7–4.0)
MCH: 26.8 pg (ref 26.0–34.0)
MCHC: 33.3 g/dL (ref 30.0–36.0)
MCV: 80.6 fL (ref 78.0–100.0)
Monocytes Absolute: 0.5 10*3/uL (ref 0.1–1.0)
Monocytes Relative: 6 % (ref 3–12)
NEUTROS ABS: 6.2 10*3/uL (ref 1.7–7.7)
Neutrophils Relative %: 68 % (ref 43–77)
Platelets: 280 10*3/uL (ref 150–400)
RBC: 5.04 MIL/uL (ref 3.87–5.11)
RDW: 15.1 % (ref 11.5–15.5)
WBC: 9.1 10*3/uL (ref 4.0–10.5)

## 2013-08-19 LAB — VITAMIN D 25 HYDROXY (VIT D DEFICIENCY, FRACTURES): Vit D, 25-Hydroxy: 33 ng/mL (ref 30–89)

## 2013-10-28 ENCOUNTER — Other Ambulatory Visit: Payer: Self-pay | Admitting: *Deleted

## 2013-10-28 DIAGNOSIS — I10 Essential (primary) hypertension: Secondary | ICD-10-CM

## 2013-10-28 MED ORDER — CARVEDILOL 6.25 MG PO TABS: ORAL_TABLET | ORAL | Status: DC

## 2013-10-28 NOTE — Telephone Encounter (Signed)
Sent in refill for 6 months for Coreg. Pharmacy having difficulty with e-scribe function. Sent in Dr. Clayborn Heron name due to not having him on file.

## 2013-12-25 ENCOUNTER — Telehealth: Payer: Self-pay

## 2013-12-25 NOTE — Telephone Encounter (Signed)
Patient left voicemail on Wednesday at 4:29pm requesting records from May 24, 2013. Also wanted to know if there is a charge to process request. Please call back at [phone].

## 2013-12-25 NOTE — Telephone Encounter (Signed)
Records ready for pick up. Called patient but she was not home. A female answered the phone and told me to call back at another time. Records in the pick up drawer.

## 2013-12-29 NOTE — Telephone Encounter (Signed)
Spoke with patient. Records are ready for pickup. She also wants billing statement from that day as well. Will get a print out and add it to records. She will pick up tomorrow.

## 2014-01-14 ENCOUNTER — Encounter: Payer: Self-pay | Admitting: Internal Medicine

## 2014-02-02 ENCOUNTER — Other Ambulatory Visit: Payer: Self-pay | Admitting: Family Medicine

## 2014-02-04 ENCOUNTER — Other Ambulatory Visit: Payer: Self-pay | Admitting: Family Medicine

## 2014-02-09 ENCOUNTER — Other Ambulatory Visit: Payer: Self-pay | Admitting: Family Medicine

## 2014-05-05 ENCOUNTER — Other Ambulatory Visit: Payer: Self-pay | Admitting: Family Medicine

## 2014-05-05 DIAGNOSIS — I10 Essential (primary) hypertension: Secondary | ICD-10-CM

## 2014-05-06 ENCOUNTER — Other Ambulatory Visit: Payer: Self-pay

## 2014-05-06 DIAGNOSIS — E039 Hypothyroidism, unspecified: Secondary | ICD-10-CM

## 2014-05-06 DIAGNOSIS — I10 Essential (primary) hypertension: Secondary | ICD-10-CM

## 2014-05-06 MED ORDER — CARVEDILOL 6.25 MG PO TABS: ORAL_TABLET | ORAL | Status: DC

## 2014-05-06 MED ORDER — LIOTHYRONINE SODIUM 5 MCG PO TABS
ORAL_TABLET | ORAL | Status: DC
Start: 2014-05-06 — End: 2015-02-22

## 2014-05-07 ENCOUNTER — Encounter: Payer: Self-pay | Admitting: Internal Medicine

## 2014-05-16 NOTE — Telephone Encounter (Signed)
Patient requesting an additional 60 day supply of carvedilol (COREG) 6.25 MG tablet She has an appointment with Dr. Lorelei Pont June 27th. [facility] on [address].   [phone] (H)

## 2014-05-18 ENCOUNTER — Encounter: Payer: Self-pay | Admitting: Internal Medicine

## 2014-05-18 MED ORDER — CARVEDILOL 6.25 MG PO TABS: 6.2500 mg | ORAL_TABLET | Freq: Two times a day (BID) | ORAL | Status: DC

## 2014-05-18 NOTE — Telephone Encounter (Signed)
Pt would actually need a 90 day supply plus some, not 60 day, to cover her until appt. Do you want to OK this?

## 2014-05-30 ENCOUNTER — Ambulatory Visit (INDEPENDENT_AMBULATORY_CARE_PROVIDER_SITE_OTHER): Payer: Medicare PPO | Admitting: Family Medicine

## 2014-05-30 VITALS — BP 138/78 | HR 60 | Temp 97.5°F | Resp 16 | Ht 61.0 in | Wt 180.0 lb

## 2014-05-30 DIAGNOSIS — F411 Generalized anxiety disorder: Secondary | ICD-10-CM

## 2014-05-30 DIAGNOSIS — E038 Other specified hypothyroidism: Secondary | ICD-10-CM

## 2014-05-30 DIAGNOSIS — R42 Dizziness and giddiness: Secondary | ICD-10-CM | POA: Diagnosis not present

## 2014-05-30 DIAGNOSIS — I10 Essential (primary) hypertension: Secondary | ICD-10-CM | POA: Diagnosis not present

## 2014-05-30 DIAGNOSIS — F419 Anxiety disorder, unspecified: Secondary | ICD-10-CM | POA: Diagnosis not present

## 2014-05-30 DIAGNOSIS — E039 Hypothyroidism, unspecified: Secondary | ICD-10-CM | POA: Diagnosis not present

## 2014-05-30 DIAGNOSIS — M8580 Other specified disorders of bone density and structure, unspecified site: Secondary | ICD-10-CM | POA: Diagnosis not present

## 2014-05-30 LAB — POCT CBC
Granulocyte percent: 63.5 %G (ref 37–80)
HCT, POC: 40.3 % (ref 37.7–47.9)
Hemoglobin: 13.2 g/dL (ref 12.2–16.2)
Lymph, poc: 2.2 (ref 0.6–3.4)
MCH, POC: 26.7 pg — AB (ref 27–31.2)
MCHC: 32.7 g/dL (ref 31.8–35.4)
MCV: 81.8 fL (ref 80–97)
MID (CBC): 0.5 (ref 0–0.9)
MPV: 6.5 fL (ref 0–99.8)
PLATELET COUNT, POC: 291 10*3/uL (ref 142–424)
POC GRANULOCYTE: 4.8 (ref 2–6.9)
POC LYMPH PERCENT: 29.4 %L (ref 10–50)
POC MID %: 7.1 % (ref 0–12)
RBC: 4.93 M/uL (ref 4.04–5.48)
RDW, POC: 15.5 %
WBC: 7.5 10*3/uL (ref 4.6–10.2)

## 2014-05-30 LAB — COMPREHENSIVE METABOLIC PANEL
ALBUMIN: 4.1 g/dL (ref 3.5–5.2)
ALT: 12 U/L (ref 0–35)
AST: 12 U/L (ref 0–37)
Alkaline Phosphatase: 52 U/L (ref 39–117)
BUN: 14 mg/dL (ref 6–23)
CHLORIDE: 103 meq/L (ref 96–112)
CO2: 28 mEq/L (ref 19–32)
Calcium: 9.1 mg/dL (ref 8.4–10.5)
Creat: 0.67 mg/dL (ref 0.50–1.10)
Glucose, Bld: 89 mg/dL (ref 70–99)
POTASSIUM: 4.3 meq/L (ref 3.5–5.3)
Sodium: 142 mEq/L (ref 135–145)
TOTAL PROTEIN: 6.6 g/dL (ref 6.0–8.3)
Total Bilirubin: 0.6 mg/dL (ref 0.2–1.2)

## 2014-05-30 LAB — TSH: TSH: 2.161 u[IU]/mL (ref 0.350–4.500)

## 2014-05-30 MED ORDER — LIOTHYRONINE SODIUM 5 MCG PO TABS: ORAL_TABLET | ORAL | Status: DC

## 2014-05-30 MED ORDER — BENAZEPRIL-HYDROCHLOROTHIAZIDE 20-12.5 MG PO TABS: ORAL_TABLET | ORAL | Status: DC

## 2014-05-30 MED ORDER — HYDRALAZINE HCL 25 MG PO TABS: ORAL_TABLET | ORAL | Status: DC

## 2014-05-30 MED ORDER — LEVOTHYROXINE SODIUM 88 MCG PO TABS: ORAL_TABLET | ORAL | Status: DC

## 2014-05-30 MED ORDER — CARVEDILOL 6.25 MG PO TABS: 6.2500 mg | ORAL_TABLET | Freq: Two times a day (BID) | ORAL | Status: DC

## 2014-05-30 NOTE — Patient Instructions (Addendum)
  Continue current medications  The bone density will be scheduled for you. Call the radiology office and reschedule it if it is a time you cannot go.  Return in 3 or 4 months for your Medicare physical which will have to be scheduled with somebody at the [address] next door.

## 2014-05-30 NOTE — Progress Notes (Signed)
Subjective: Subjective: 69 year old lady who is here for recheck of her labs. She wants to be certain that she is healthy she is getting to start a new job. She says she's not going to be able to come for a long time for a physical because she will be involved with his new job. She is worried about never having heard from anybody on doing a bone density study done. We will schedule that for her. She denies any chest pains. She feels dizzy this morning. She thinks is from swelling up and down on her cell phone. She complains of some nasal congestion. She does not smoke. She is anxious.  Objective: Anxious lady in no acute distress. Fundi appear benign. Throat clear. TMs normal. Without nodes. Chest is clear to all station. Heart regular without murmurs. No carotid bruits.  Assessment: Nonspecific dizziness Hypertension Hypothyroidism History of osteopenia Anxiety  Plan: Refilled all her medications. See her back in 3 or 4 months and she is due a physical.

## 2014-06-02 ENCOUNTER — Encounter: Payer: Self-pay | Admitting: Family Medicine

## 2014-06-04 ENCOUNTER — Telehealth: Payer: Self-pay

## 2014-06-04 NOTE — Telephone Encounter (Signed)
Pt called about labs. Let her know they were normal and that we mailed her a copy

## 2014-07-14 ENCOUNTER — Ambulatory Visit (AMBULATORY_SURGERY_CENTER): Payer: Self-pay

## 2014-07-14 DIAGNOSIS — Z86010 Personal history of colonic polyps: Secondary | ICD-10-CM

## 2014-07-14 NOTE — Patient Instructions (Signed)

## 2014-07-16 ENCOUNTER — Encounter: Payer: Self-pay | Admitting: Internal Medicine

## 2014-07-22 ENCOUNTER — Ambulatory Visit (INDEPENDENT_AMBULATORY_CARE_PROVIDER_SITE_OTHER): Payer: Medicare PPO | Admitting: Physician Assistant

## 2014-07-22 ENCOUNTER — Telehealth: Payer: Self-pay | Admitting: Internal Medicine

## 2014-07-22 VITALS — BP 128/82 | HR 54 | Temp 98.3°F | Resp 18 | Ht 60.5 in | Wt 181.0 lb

## 2014-07-22 DIAGNOSIS — J309 Allergic rhinitis, unspecified: Secondary | ICD-10-CM | POA: Diagnosis not present

## 2014-07-22 DIAGNOSIS — Z13820 Encounter for screening for osteoporosis: Secondary | ICD-10-CM | POA: Diagnosis not present

## 2014-07-22 DIAGNOSIS — G4700 Insomnia, unspecified: Secondary | ICD-10-CM

## 2014-07-22 DIAGNOSIS — H938X3 Other specified disorders of ear, bilateral: Secondary | ICD-10-CM | POA: Diagnosis not present

## 2014-07-22 DIAGNOSIS — J029 Acute pharyngitis, unspecified: Secondary | ICD-10-CM

## 2014-07-22 LAB — POCT RAPID STREP A (OFFICE): Rapid Strep A Screen: NEGATIVE

## 2014-07-22 MED ORDER — IPRATROPIUM BROMIDE 0.03 % NA SOLN: 2.0000 | Freq: Two times a day (BID) | NASAL | Status: DC

## 2014-07-22 NOTE — Telephone Encounter (Signed)
Pt has a sore throat and is at urgent care to get checked out.  Pt concerned that she wont be able to have her procedure next Tuesday 07-28-2014.  Instructed pt she should be fine to have her procedure by next week, if they start her on antibiotics today she is still fine to proceed for Tuesday . Pt concerned about the cancellation fee. She is supposed to cancel by tomorrow.  Instructed pt to monitor her s/s and call with fever or worsening symptoms.  Pt verbalized understanding  Marijean Niemann

## 2014-07-22 NOTE — Telephone Encounter (Signed)
848 am returned call. No answer, [facility] on AM that identified Pt by name   [name] Pt has a procedure 07-28-2014 Tuesday

## 2014-07-22 NOTE — Progress Notes (Signed)
Subjective:    Patient ID: [name], female    DOB: 27-Feb-1946, 69 y.o.   MRN: [id number]  HPI  This is a 69 year old female who is presenting with sore throat and ear fullness x 3 days. States throat feels like her tonsils are swollen but they are not painful. States feels like there's pressure behind bilateral ears. No decreased hearing. She is sneezing some. Denies fever, chills, cough, nasal congestion, difficulty swallowing, shortness of breath, wheezing. Has intermittent mild env allergies. States she often gets similar symptoms from post-nasal drip and her allergies but usually goes away after a day or two. Has tried gargling salt water and eating soup without much help. Pt is very worried about her symptoms because of her upcoming colonoscopy in 6 days. She is hoping she will be well for it.  Pt also mentions that she has intermittent insomnia. Sometimes nights she sleeps very well. Other times she wakes in the middle of the night to urinate and cannot fall back asleep. She states this makes the next day at work very hard for her. She has never taken anything for sleep.  Pt was referred for dexa scan 2 months ago but states she never got a phone call to make appt. She is wondering if she can make the appt herself.  Review of Systems  Constitutional: Negative for fever and chills.  HENT: Positive for postnasal drip. Negative for congestion, ear pain, sinus pressure, sore throat, trouble swallowing and voice change.        Ear fullness Sensation throat swelling  Respiratory: Negative for cough, shortness of breath and wheezing.   Gastrointestinal: Negative for nausea and vomiting.  Skin: Negative for rash.  Allergic/Immunologic: Positive for environmental allergies.  Hematological: Negative for adenopathy.  Psychiatric/Behavioral: Negative for sleep disturbance.    Patient Active Problem List   Diagnosis Date Noted  . Essential hypertension, benign 04/28/2012  .  Osteoarthritis of left knee 04/28/2012  . Unspecified hypothyroidism 04/28/2012  . Anxiety 11/17/2010  . Sleep apnea 11/17/2010   Prior to Admission medications   Medication Sig Start Date End Date Taking? Authorizing Provider  benazepril-hydrochlorthiazide (LOTENSIN HCT) 20-12.5 MG per tablet Take one twice daily for blood pressure 05/30/14  Yes Posey Boyer, MD  carvedilol (COREG) 6.25 MG tablet Take 1 tablet (6.25 mg total) by mouth 2 (two) times daily with a meal. 05/30/14  Yes Posey Boyer, MD  hydrALAZINE (APRESOLINE) 25 MG tablet Take 2 in the morning, 1 at lunch, and 2 at dinner for blood pressure 05/30/14  Yes Posey Boyer, MD  levothyroxine (SYNTHROID, LEVOTHROID) 88 MCG tablet Take one daily for thyroid 05/30/14  Yes Posey Boyer, MD  liothyronine (CYTOMEL) 5 MCG tablet Take one daily for thyroid 05/30/14  Yes Posey Boyer, MD   Allergies  Allergen Reactions  . Bactrim [Sulfamethoxazole-Trimethoprim]   . Epinephrine     shakey  . Erythromycin   . Sulfa Antibiotics Rash   Patient's social and family history were reviewed.     Objective:   Physical Exam  Constitutional: She is oriented to person, place, and time. She appears well-developed and well-nourished. No distress.  HENT:  Head: Normocephalic and atraumatic.  Right Ear: Hearing, external ear and ear canal normal.  Left Ear: Hearing, external ear and ear canal normal.  Nose: Nose normal.  Mouth/Throat: Uvula is midline and mucous membranes are normal. Posterior oropharyngeal erythema (mild) present. No oropharyngeal exudate or posterior oropharyngeal edema.  Bilateral TMs obstructed by cerumen  Eyes: Conjunctivae and lids are normal. Right eye exhibits no discharge. Left eye exhibits no discharge. No scleral icterus.  Cardiovascular: Normal rate, regular rhythm, normal heart sounds and normal pulses.   No murmur heard. Pulmonary/Chest: Effort normal and breath sounds normal. No respiratory distress. She has no  wheezes. She has no rhonchi. She has no rales.  Musculoskeletal: Normal range of motion.  Lymphadenopathy:       Head (right side): No submental, no submandibular, no tonsillar, no preauricular, no posterior auricular and no occipital adenopathy present.       Head (left side): No submental, no submandibular, no tonsillar, no preauricular, no posterior auricular and no occipital adenopathy present.    She has no cervical adenopathy.  Neurological: She is alert and oriented to person, place, and time.  Skin: Skin is warm, dry and intact. No lesion and no rash noted.  Psychiatric: Her speech is normal and behavior is normal. Thought content normal. Her mood appears anxious.  Pt seems very worried about her symptoms and her upcoming colonoscopy.    BP 128/82 mmHg  Pulse 54  Temp(Src) 98.3 F (36.8 C) (Oral)  Resp 18  Ht 5' 0.5" (1.537 m)  Wt 181 lb (82.101 kg)  BMI 34.75 kg/m2  SpO2 97%  Results for orders placed or performed in visit on 07/22/14  POCT rapid strep A  Result Value Ref Range   Rapid Strep A Screen Negative Negative       Assessment & Plan:  1. Sore throat 2. Ear fullness, bilateral 3. Allergic rhinitis Exam unremarkable. Rapid strep negative, culture pending. Pt reassured this is likely allergies and this made her feel much better. I was unable to visualize TMs d/t cerumen blockage - pt refused ear lavage as has made her very dizzy in the past. atrovent and zyrtec for current symptoms. She will return in 1-2 weeks if symptoms are not improving or at anytime if symptoms worsen. - POCT rapid strep A - ipratropium (ATROVENT) 0.03 % nasal spray; Place 2 sprays into both nostrils 2 (two) times daily.  Dispense: 30 mL; Refill: 0 - Culture, Group A Strep  4. Insomnia Advised trying melatonin on night before work and seeing if this helps.  5. Osteoporosis screening Gave pt phone number to make dexa scan appt.   Benjaman Pott Drenda Freeze, MHS [facility]  07/22/2014

## 2014-07-22 NOTE — Patient Instructions (Signed)
Use nasal spray twice a day until feeling better. Take a zyrtec or vitamin C daily. Drink plenty of fluids and rest. May want to buy melatonin 2 mg tabs and play with dose. You can take up to 10 mg. May consider taking on nights before work so you are well rested. You should be just fine for your colonoscopy on Tuesday. If you have worsening of symptoms, you become unable to swallow or have difficulty breathing, return or go to the ED.

## 2014-07-23 LAB — CULTURE, GROUP A STREP: ORGANISM ID, BACTERIA: NORMAL

## 2014-07-28 ENCOUNTER — Ambulatory Visit (AMBULATORY_SURGERY_CENTER): Payer: Medicare PPO | Admitting: Internal Medicine

## 2014-07-28 ENCOUNTER — Encounter: Payer: Self-pay | Admitting: Internal Medicine

## 2014-07-28 VITALS — BP 184/73 | HR 54 | Temp 97.0°F | Resp 22 | Ht 62.0 in | Wt 181.0 lb

## 2014-07-28 DIAGNOSIS — D124 Benign neoplasm of descending colon: Secondary | ICD-10-CM | POA: Diagnosis not present

## 2014-07-28 DIAGNOSIS — D125 Benign neoplasm of sigmoid colon: Secondary | ICD-10-CM | POA: Diagnosis not present

## 2014-07-28 DIAGNOSIS — D128 Benign neoplasm of rectum: Secondary | ICD-10-CM | POA: Diagnosis not present

## 2014-07-28 DIAGNOSIS — Z86010 Personal history of colonic polyps: Secondary | ICD-10-CM

## 2014-07-28 MED ORDER — SODIUM CHLORIDE 0.9 % IV SOLN: 500.0000 mL | INTRAVENOUS | Status: DC

## 2014-07-28 NOTE — Patient Instructions (Addendum)
I found and removed 3 polyps today. They all look benign.  I will let you know pathology results and when to have another routine colonoscopy by mail.  Your blood pressure was elevated today, possibly from anxiety about the procedure but please be sure to check it at home or with your primary care provider.  I appreciate the opportunity to care for you. Gatha Mayer, MD, FACG  YOU HAD AN ENDOSCOPIC PROCEDURE TODAY AT [facility]:   Refer to the procedure report that was given to you for any specific questions about what was found during the examination.  If the procedure report does not answer your questions, please call your gastroenterologist to clarify.  If you requested that your care partner not be given the details of your procedure findings, then the procedure report has been included in a sealed envelope for you to review at your convenience later.  YOU SHOULD EXPECT: Some feelings of bloating in the abdomen. Passage of more gas than usual.  Walking can help get rid of the air that was put into your GI tract during the procedure and reduce the bloating. If you had a lower endoscopy (such as a colonoscopy or flexible sigmoidoscopy) you may notice spotting of blood in your stool or on the toilet paper. If you underwent a bowel prep for your procedure, you may not have a normal bowel movement for a few days.  Please Note:  You might notice some irritation and congestion in your nose or some drainage.  This is from the oxygen used during your procedure.  There is no need for concern and it should clear up in a day or so.  SYMPTOMS TO REPORT IMMEDIATELY:   Following lower endoscopy (colonoscopy or flexible sigmoidoscopy):  Excessive amounts of blood in the stool  Significant tenderness or worsening of abdominal pains  Swelling of the abdomen that is new, acute  Fever of 100F or higher  For urgent or emergent issues, a gastroenterologist can be reached at any  hour by calling [phone].   DIET: Your first meal following the procedure should be a small meal and then it is ok to progress to your normal diet. Heavy or fried foods are harder to digest and may make you feel nauseous or bloated.  Likewise, meals heavy in dairy and vegetables can increase bloating.  Drink plenty of fluids but you should avoid alcoholic beverages for 24 hours.  ACTIVITY:  You should plan to take it easy for the rest of today and you should NOT DRIVE or use heavy machinery until tomorrow (because of the sedation medicines used during the test).    FOLLOW UP: Our staff will call the number listed on your records the next business day following your procedure to check on you and address any questions or concerns that you may have regarding the information given to you following your procedure. If we do not reach you, we will leave a message.  However, if you are feeling well and you are not experiencing any problems, there is no need to return our call.  We will assume that you have returned to your regular daily activities without incident.  If any biopsies were taken you will be contacted by phone or by letter within the next 1-3 weeks.  Please call us at [phone] if you have not heard about the biopsies in 3 weeks.    SIGNATURES/CONFIDENTIALITY: You and/or your care partner have signed paperwork which will  be entered into your electronic medical record.  These signatures attest to the fact that that the information above on your After Visit Summary has been reviewed and is understood.  Full responsibility of the confidentiality of this discharge information lies with you and/or your care-partner.  Please review polyp handout provided. Hold aspirin and all aspirin products for 2 weeks.

## 2014-07-28 NOTE — Progress Notes (Signed)
Called to room to assist during endoscopic procedure.  Patient ID and intended procedure confirmed with present staff. Received instructions for my participation in the procedure from the performing physician.  

## 2014-07-28 NOTE — Progress Notes (Signed)
Transferred to PACU.  NAD.  Report to Abigail Butts, Therapist, sports.

## 2014-07-28 NOTE — Op Note (Signed)
[place]  [address] [place], [id number]   COLONOSCOPY PROCEDURE REPORT  PATIENT: [name], [name]  MR#: [id number] BIRTHDATE: October 21, 1945 , 30  yrs. old GENDER: female ENDOSCOPIST: Gatha Mayer, MD, [facility] PROCEDURE DATE:  07/28/2014 PROCEDURE:   Colonoscopy with snare polypectomy First Screening Colonoscopy - Avg.  risk and is 50 yrs.  old or older - No.  Prior Negative Screening - Now for repeat screening. N/A  History of Adenoma - Now for follow-up colonoscopy & has been > or = to 3 yrs.  Yes hx of adenoma.  Has been 3 or more years since last colonoscopy.  Polyps removed today? Yes ASA CLASS:   Class II INDICATIONS:Surveillance due to prior colonic neoplasia and PH Colon Adenoma. MEDICATIONS: Propofol 160 mg IV and Monitored anesthesia care  DESCRIPTION OF PROCEDURE:   After the risks benefits and alternatives of the procedure were thoroughly explained, informed consent was obtained.  The digital rectal exam revealed no abnormalities of the rectum.   The LB [id number]  endoscope was introduced through the anus and advanced to the cecum, which was identified by both the appendix and ileocecal valve. No adverse events experienced.   The quality of the prep was excellent. (MiraLax was used)  The instrument was then slowly withdrawn as the colon was fully examined. Estimated blood loss is zero unless otherwise noted in this procedure report.      COLON FINDINGS: Three  polyps ranging from 5 to 39mm in size were found in the descending colon, sigmoid colon, and rectum. Polypectomies were performed with a cold snare and using snare cautery (12 mm descending).  The resection was complete, the polyp tissue was completely retrieved and sent to histology. Otherwise normal. Retroflexed views revealed no abnormalities. The time to cecum = 3.7 Withdrawal time = 12.5   The scope was withdrawn and the procedure completed. COMPLICATIONS: There were no  immediate complications.  ENDOSCOPIC IMPRESSION: Three  polyps ranging from 5 to 71mm in size were found in the descending colon, sigmoid colon, and rectum; polypectomies were performed with a cold snare and using snare cautery Otherwise normal colonoscopy with excellent prep.  RECOMMENDATIONS: 1.  Hold Aspirin and all other NSAIDS for 2 weeks. 2.  Timing of repeat colonoscopy will be determined by pathology findings.  Hx advanced adenomas 2012  eSigned:  Gatha Mayer, MD, [facility] 07/28/2014 9:13 AM   cc: The Patient

## 2014-07-29 ENCOUNTER — Telehealth: Payer: Self-pay | Admitting: *Deleted

## 2014-07-29 NOTE — Telephone Encounter (Signed)
  Follow up Call-  Call back number 07/28/2014  Post procedure Call Back phone  # [phone]  Permission to leave phone message Yes     Patient questions:  Do you have a fever, pain , or abdominal swelling? No. Pain Score  0 *  Have you tolerated food without any problems? Yes.    Have you been able to return to your normal activities? Yes.    Do you have any questions about your discharge instructions: Diet   No. Medications  No. Follow up visit  No.  Do you have questions or concerns about your Care? No.  Actions: * If pain score is 4 or above: No action needed, pain <4.

## 2014-08-05 ENCOUNTER — Encounter: Payer: Self-pay | Admitting: Internal Medicine

## 2014-08-05 DIAGNOSIS — Z86010 Personal history of colonic polyps: Secondary | ICD-10-CM

## 2014-08-05 NOTE — Progress Notes (Signed)
Quick Note:  3 adenomas max 12 mm Repeat colonoscopy 2019 ______

## 2014-08-07 NOTE — Addendum Note (Signed)
Addended by: Steva Ready on: 08/07/2014 11:33 AM   Modules accepted: Level of Service

## 2014-08-12 ENCOUNTER — Encounter: Payer: Self-pay | Admitting: *Deleted

## 2014-08-31 ENCOUNTER — Encounter: Payer: Medicare PPO | Admitting: Family Medicine

## 2014-08-31 ENCOUNTER — Other Ambulatory Visit: Payer: Self-pay

## 2014-09-16 DIAGNOSIS — G4733 Obstructive sleep apnea (adult) (pediatric): Secondary | ICD-10-CM | POA: Diagnosis not present

## 2014-10-27 DIAGNOSIS — G4733 Obstructive sleep apnea (adult) (pediatric): Secondary | ICD-10-CM | POA: Diagnosis not present

## 2014-11-06 ENCOUNTER — Other Ambulatory Visit: Payer: Self-pay | Admitting: Family Medicine

## 2014-11-30 ENCOUNTER — Encounter: Payer: Medicare PPO | Admitting: Family Medicine

## 2014-12-01 DIAGNOSIS — G4733 Obstructive sleep apnea (adult) (pediatric): Secondary | ICD-10-CM | POA: Diagnosis not present

## 2014-12-02 ENCOUNTER — Ambulatory Visit (INDEPENDENT_AMBULATORY_CARE_PROVIDER_SITE_OTHER): Payer: Medicare PPO | Admitting: Family Medicine

## 2014-12-02 ENCOUNTER — Encounter: Payer: Self-pay | Admitting: Family Medicine

## 2014-12-02 VITALS — BP 140/80 | HR 54 | Temp 98.0°F | Resp 18 | Ht 61.0 in | Wt 182.2 lb

## 2014-12-02 DIAGNOSIS — I10 Essential (primary) hypertension: Secondary | ICD-10-CM | POA: Diagnosis not present

## 2014-12-02 DIAGNOSIS — Z13220 Encounter for screening for lipoid disorders: Secondary | ICD-10-CM | POA: Diagnosis not present

## 2014-12-02 DIAGNOSIS — Z5181 Encounter for therapeutic drug level monitoring: Secondary | ICD-10-CM | POA: Diagnosis not present

## 2014-12-02 DIAGNOSIS — Z119 Encounter for screening for infectious and parasitic diseases, unspecified: Secondary | ICD-10-CM

## 2014-12-02 DIAGNOSIS — E559 Vitamin D deficiency, unspecified: Secondary | ICD-10-CM | POA: Diagnosis not present

## 2014-12-02 DIAGNOSIS — Z8739 Personal history of other diseases of the musculoskeletal system and connective tissue: Secondary | ICD-10-CM

## 2014-12-02 DIAGNOSIS — Z1239 Encounter for other screening for malignant neoplasm of breast: Secondary | ICD-10-CM | POA: Diagnosis not present

## 2014-12-02 DIAGNOSIS — E039 Hypothyroidism, unspecified: Secondary | ICD-10-CM | POA: Diagnosis not present

## 2014-12-02 DIAGNOSIS — Z0000 Encounter for general adult medical examination without abnormal findings: Secondary | ICD-10-CM

## 2014-12-02 DIAGNOSIS — Z130 Encounter for screening for diseases of the blood and blood-forming organs and certain disorders involving the immune mechanism: Secondary | ICD-10-CM

## 2014-12-02 LAB — CBC
HCT: 39 % (ref 36.0–46.0)
HEMOGLOBIN: 13.3 g/dL (ref 12.0–15.0)
MCH: 27.2 pg (ref 26.0–34.0)
MCHC: 34.1 g/dL (ref 30.0–36.0)
MCV: 79.8 fL (ref 78.0–100.0)
MPV: 9.4 fL (ref 8.6–12.4)
PLATELETS: 257 10*3/uL (ref 150–400)
RBC: 4.89 MIL/uL (ref 3.87–5.11)
RDW: 15.2 % (ref 11.5–15.5)
WBC: 6.8 10*3/uL (ref 4.0–10.5)

## 2014-12-02 LAB — LIPID PANEL
CHOL/HDL RATIO: 4 ratio (ref <=5.0)
CHOLESTEROL: 185 mg/dL (ref 125–200)
HDL: 46 mg/dL (ref >=46)
LDL Cholesterol: 113 mg/dL (ref <130)
Triglycerides: 132 mg/dL (ref <150)
VLDL: 26 mg/dL (ref <30)

## 2014-12-02 LAB — COMPREHENSIVE METABOLIC PANEL
ALBUMIN: 4.1 g/dL (ref 3.6–5.1)
ALT: 13 U/L (ref 6–29)
AST: 11 U/L (ref 10–35)
Alkaline Phosphatase: 52 U/L (ref 33–130)
BILIRUBIN TOTAL: 0.7 mg/dL (ref 0.2–1.2)
BUN: 17 mg/dL (ref 7–25)
CALCIUM: 9 mg/dL (ref 8.6–10.4)
CO2: 28 mmol/L (ref 20–31)
Chloride: 102 mmol/L (ref 98–110)
Creat: 0.7 mg/dL (ref 0.60–0.93)
Glucose, Bld: 93 mg/dL (ref 65–99)
Potassium: 3.9 mmol/L (ref 3.5–5.3)
Sodium: 141 mmol/L (ref 135–146)
Total Protein: 6.7 g/dL (ref 6.1–8.1)

## 2014-12-02 LAB — TSH: TSH: 5.529 u[IU]/mL — ABNORMAL HIGH (ref 0.350–4.500)

## 2014-12-02 LAB — HEPATITIS C ANTIBODY: HCV Ab: NEGATIVE

## 2014-12-02 MED ORDER — BENAZEPRIL HCL 40 MG PO TABS: 40.0000 mg | ORAL_TABLET | Freq: Every day | ORAL | Status: DC

## 2014-12-02 MED ORDER — LIOTHYRONINE SODIUM 5 MCG PO TABS: ORAL_TABLET | ORAL | Status: DC

## 2014-12-02 NOTE — Progress Notes (Addendum)
[facility] [address], [place] [address] [phone]  Date:  12/02/2014   Name:  [name]   DOB:  01-Mar-1946   MRN:  [id number]  PCP:  Lamar Blinks, MD    Chief Complaint: Annual Exam   History of Present Illness:  [name] is a 69 y.o. very pleasant female patient who presents with the following:  Here today for a recheck and physical.  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 This does tend to irritate her nose some and she uses vaseline in her nose prn She does not wish to do paps anymore- she had negative adequate screening.   Last dexa 03/2012;  I cannot find the images however.    She refuses both flu and prevnar vaccines today  She does notice sx of stress incont- this can occur with laugh, sneeze.  She is doing kegels.  Offered medication- she will think about this.  She would like to try stopping her diuretic to see if this might help Patient Active Problem List   Diagnosis Date Noted  . Essential hypertension, benign 04/28/2012  . Osteoarthritis of left knee 04/28/2012  . Unspecified hypothyroidism 04/28/2012  . Hx of colonic polyps 12/15/2010  . Anxiety 11/17/2010  . Sleep apnea 11/17/2010    Past Medical History  Diagnosis Date  . Environmental allergies   . Anemia   . Anxiety   . Osteopenia 02/2012    -1.8 T score right femur neck  . Hypothyroidism   . Sleep apnea   . Vitamin D deficiency     history of  . Obesity   . Interstitial cystitis   . H/O varicella   . History of measles, mumps, or rubella   . Fibroid 02/19/03  . Dysuria 2004  . Menopausal symptoms 2004  . Abnormal perimenopausal bleeding 2005  . Endometrial  polyp 2006  . Urinary incontinence 2011  . PMB (postmenopausal bleeding) 06/21/10  . Hypertension 07-19-2010    echo for pre-op EF 55%  normal left wall thickness LA mildly dialated with mitral and tricuspid regurgatation  . Allergy   . Hx of colonic polyps 12/15/2010    Past Surgical History  Procedure Laterality Date  . Myomectomy  1974  . Hysteroscopy  2006 and May 2012    for fibroids, endometrial polyps  . Mouth surgery    . Colonoscopy  12/15/10    Social History  Substance Use Topics  . Smoking status: Never Smoker   . Smokeless tobacco: Never Used  . Alcohol Use: 0.6 oz/week    1 Glasses of wine per week     Comment: occasional (1-2 times per week)    Family History  Problem Relation Age of Onset  . Colon cancer Paternal Uncle   . Kidney disease Father     from uremia  . Benign prostatic hyperplasia Father   . Clotting disorder Maternal Grandmother     ????  . Hypertension Mother     Allergies  Allergen Reactions  . Bactrim [Sulfamethoxazole-Trimethoprim]   . Epinephrine     shakey  . Erythromycin   . Sulfa Antibiotics Rash  Medication list has been reviewed and updated.  Current Outpatient Prescriptions on File Prior to Visit  Medication Sig Dispense Refill  . benazepril-hydrochlorthiazide (LOTENSIN HCT) 20-12.5 MG per tablet Take one twice daily for blood pressure 180 tablet 3  . carvedilol (COREG) 6.25 MG tablet Take 1 tablet (6.25 mg total) by mouth 2 (two) times daily with a meal. 180 tablet 3  . hydrALAZINE (APRESOLINE) 25 MG tablet Take 2 in the morning, 1 at lunch, and 2 at dinner for blood pressure 450 tablet 3  . levothyroxine (SYNTHROID, LEVOTHROID) 88 MCG tablet Take one daily for thyroid 90 tablet 3  . liothyronine (CYTOMEL) 5 MCG tablet TAKE 1 TABLET BY MOUTH DAILY FOR THYROID "OV NEEDED FOR REFILLS" 30 tablet 0  . ipratropium (ATROVENT) 0.03 % nasal spray Place 2 sprays into both nostrils 2 (two) times daily. (Patient not taking: Reported  on 12/02/2014) 30 mL 0   No current facility-administered medications on file prior to visit.    Review of Systems:  As per HPI- otherwise negative.   Physical Examination: Filed Vitals:   12/02/14 0931  BP: 148/90  Pulse:   Temp:   Resp:    Filed Vitals:   12/02/14 0929  Height: 5\' 1"  (1.549 m)  Weight: 182 lb 3.2 oz (82.645 kg)   Body mass index is 34.44 kg/(m^2). Ideal Body Weight: Weight in (lb) to have BMI = 25: 132  GEN: WDWN, NAD, Non-toxic, A & O x 3, obese, looks well HEENT: Atraumatic, Normocephalic. Neck supple. No masses, No LAD.  Bilateral TM wnl, oropharynx normal.  PEERL,EOMI.   Ears and Nose: No external deformity. CV: RRR, No M/G/R. No JVD. No thrill. No extra heart sounds. PULM: CTA B, no wheezes, crackles, rhonchi. No retractions. No resp. distress. No accessory muscle use. ABD: S, NT, ND. No rebound. No HSM. EXTR: No c/c/e NEURO Normal gait.  PSYCH: Normally interactive. Conversant. Not depressed or anxious appearing.  Calm demeanor.  Breast: normal exam, no masses/ dimpling/ discharge    Assessment and Plan: Physical exam  Hypothyroidism, unspecified hypothyroidism type - Plan: Comprehensive metabolic panel, TSH, liothyronine (CYTOMEL) 5 MCG tablet  History of osteoporosis - Plan: DG Bone Density, Vitamin D, 25-hydroxy  Screening for hyperlipidemia - Plan: Lipid panel  Screening for deficiency anemia - Plan: CBC  Medication monitoring encounter - Plan: Comprehensive metabolic panel  Screening for breast cancer - Plan: MM Digital Screening  Screening examination for infectious disease - Plan: Hepatitis C antibody  Will update her dexa scan and vitamin D level BP control is ok, will DC HCTZ given her stress incont.  She will check her BP at home and let me know if it starts going up Referral for mammo also Check thyroid levels and did refills Will plan further follow- up pending labs.   Signed Lamar Blinks, MD  Called with her  labs.  All ok except vit D is a bit low and TSH has gone up. Also, she plans to go back on the losartan/hctz as she notes that her BP is going up and this is making her nervous.  Suggested that we recheck her TSH in a week or so and then adjust her thyroid med if necessary- she would prefer to go ahead and change the dose Will increase her synthroid from 88 to 100 mcg, and she will come by for a repeat TSH in about 6 weeks  Results for orders placed or performed in visit on 12/02/14  Comprehensive metabolic panel  Result Value Ref Range   Sodium 141 135 - 146 mmol/L   Potassium 3.9 3.5 - 5.3 mmol/L   Chloride 102 98 - 110 mmol/L   CO2 28 20 - 31 mmol/L   Glucose, Bld 93 65 - 99 mg/dL   BUN 17 7 - 25 mg/dL   Creat 0.70 0.60 - 0.93 mg/dL   Total Bilirubin 0.7 0.2 - 1.2 mg/dL   Alkaline Phosphatase 52 33 - 130 U/L   AST 11 10 - 35 U/L   ALT 13 6 - 29 U/L   Total Protein 6.7 6.1 - 8.1 g/dL   Albumin 4.1 3.6 - 5.1 g/dL   Calcium 9.0 8.6 - 10.4 mg/dL  CBC  Result Value Ref Range   WBC 6.8 4.0 - 10.5 K/uL   RBC 4.89 3.87 - 5.11 MIL/uL   Hemoglobin 13.3 12.0 - 15.0 g/dL   HCT 39.0 36.0 - 46.0 %   MCV 79.8 78.0 - 100.0 fL   MCH 27.2 26.0 - 34.0 pg   MCHC 34.1 30.0 - 36.0 g/dL   RDW 15.2 11.5 - 15.5 %   Platelets 257 150 - 400 K/uL   MPV 9.4 8.6 - 12.4 fL  Lipid panel  Result Value Ref Range   Cholesterol 185 125 - 200 mg/dL   Triglycerides 132 <150 mg/dL   HDL 46 >=46 mg/dL   Total CHOL/HDL Ratio 4.0 <=5.0 Ratio   VLDL 26 <30 mg/dL   LDL Cholesterol 113 <130 mg/dL  TSH  Result Value Ref Range   TSH 5.529 (H) 0.350 - 4.500 uIU/mL  Hepatitis C antibody  Result Value Ref Range   HCV Ab NEGATIVE NEGATIVE  Vitamin D, 25-hydroxy  Result Value Ref Range   Vit D, 25-Hydroxy 18 (L) 30 - 100 ng/mL

## 2014-12-02 NOTE — Patient Instructions (Addendum)
I will be in touch with your labs asap We will get you set up for a mammogram and bone density  We do recommend that you get an annual flu shot and also the prevnar vaccine to help prevent pneumonia  We will try changing from benazapril/ hctz to just benazapril- this will get rid of the fluid pill.  Hopefully this will help with your urinary symptoms Please let me know how your BP looks with this, and if it helps with your urinary symptoms.   I would like your BP to be under about 140/90; let me know if consistently running higher than this number

## 2014-12-03 ENCOUNTER — Other Ambulatory Visit: Payer: Self-pay

## 2014-12-03 DIAGNOSIS — Z1231 Encounter for screening mammogram for malignant neoplasm of breast: Secondary | ICD-10-CM

## 2014-12-03 DIAGNOSIS — E2839 Other primary ovarian failure: Secondary | ICD-10-CM

## 2014-12-03 LAB — VITAMIN D 25 HYDROXY (VIT D DEFICIENCY, FRACTURES): Vit D, 25-Hydroxy: 18 ng/mL — ABNORMAL LOW (ref 30–100)

## 2014-12-05 ENCOUNTER — Encounter: Payer: Self-pay | Admitting: Family Medicine

## 2014-12-05 MED ORDER — LEVOTHYROXINE SODIUM 100 MCG PO TABS: 100.0000 ug | ORAL_TABLET | Freq: Every day | ORAL | Status: DC

## 2014-12-05 MED ORDER — VITAMIN D (ERGOCALCIFEROL) 1.25 MG (50000 UNIT) PO CAPS: 50000.0000 [IU] | ORAL_CAPSULE | ORAL | Status: DC

## 2014-12-05 MED ORDER — BENAZEPRIL-HYDROCHLOROTHIAZIDE 20-12.5 MG PO TABS: ORAL_TABLET | ORAL | Status: DC

## 2014-12-05 NOTE — Addendum Note (Signed)
Addended by: Lamar Blinks C on: 12/05/2014 02:57 PM   Modules accepted: Orders, Medications

## 2014-12-05 NOTE — Addendum Note (Signed)
Addended by: Lamar Blinks C on: 12/05/2014 02:55 PM   Modules accepted: Orders, Medications

## 2014-12-07 NOTE — Telephone Encounter (Signed)
This was addressed with pt on the phone- see note from 9/28

## 2015-01-27 ENCOUNTER — Ambulatory Visit: Payer: Medicare PPO

## 2015-01-27 ENCOUNTER — Other Ambulatory Visit: Payer: Medicare PPO

## 2015-02-05 ENCOUNTER — Ambulatory Visit
Admission: RE | Admit: 2015-02-05 | Discharge: 2015-02-05 | Disposition: A | Discharge status: Home / Self Care | Payer: Medicare PPO | Source: Ambulatory Visit | Attending: Family Medicine | Admitting: Family Medicine

## 2015-02-05 DIAGNOSIS — Z1231 Encounter for screening mammogram for malignant neoplasm of breast: Secondary | ICD-10-CM

## 2015-02-05 DIAGNOSIS — E2839 Other primary ovarian failure: Secondary | ICD-10-CM

## 2015-02-05 DIAGNOSIS — M85851 Other specified disorders of bone density and structure, right thigh: Secondary | ICD-10-CM | POA: Diagnosis not present

## 2015-02-07 ENCOUNTER — Encounter: Payer: Self-pay | Admitting: Family Medicine

## 2015-02-09 NOTE — Telephone Encounter (Signed)
How strange!  This is something I will need to make some phone calls to figure out.  I am not in the country this week and do not have a phone, but I will get to the bottom of this next week for you.  Will be in touch

## 2015-02-12 ENCOUNTER — Ambulatory Visit (INDEPENDENT_AMBULATORY_CARE_PROVIDER_SITE_OTHER): Payer: Medicare PPO | Admitting: Emergency Medicine

## 2015-02-12 VITALS — BP 132/84 | HR 58 | Temp 98.0°F | Resp 16 | Ht 61.0 in | Wt 180.0 lb

## 2015-02-12 DIAGNOSIS — E039 Hypothyroidism, unspecified: Secondary | ICD-10-CM | POA: Diagnosis not present

## 2015-02-12 DIAGNOSIS — S0181XA Laceration without foreign body of other part of head, initial encounter: Secondary | ICD-10-CM

## 2015-02-12 DIAGNOSIS — Z23 Encounter for immunization: Secondary | ICD-10-CM | POA: Diagnosis not present

## 2015-02-12 NOTE — Progress Notes (Addendum)
Subjective:  Patient ID: [name], female    DOB: 12/28/1945  Age: 69 y.o. MRN: [id number]  CC: blood work; Insurance account manager; Immunizations; and Medication Refill   HPI [name] presents  patients number complaints. First is that she scratched herself on a piece of metal in her bathroom facial laceration of her hand for which she wants a tetanus booster as she is not current on tetanus. She is requesting a draw of a TSH as her thyroid replacement has been recently changed. She was due to come back about this time for a redraw. She also has a small cystic mass on her upper lip which she has troubled by.  History Atena has a past medical history of Environmental allergies; Anemia; Anxiety; Osteopenia (02/2012); Hypothyroidism; Sleep apnea; Vitamin D deficiency; Obesity; Interstitial cystitis; H/O varicella; History of measles, mumps, or rubella; Fibroid (02/19/03); Dysuria (2004); Menopausal symptoms (2004); Abnormal perimenopausal bleeding (2005); Endometrial polyp (2006); Urinary incontinence (2011); PMB (postmenopausal bleeding) (06/21/10); Hypertension (07-19-2010); Allergy; and colonic polyps (12/15/2010).   She has past surgical history that includes Myomectomy (1974); Hysteroscopy (2006 and May 2012); Mouth surgery; and Colonoscopy (12/15/10).   Her  family history includes Benign prostatic hyperplasia in her father; Clotting disorder in her maternal grandmother; Colon cancer in her paternal uncle; Hypertension in her mother; Kidney disease in her father.  She   reports that she has never smoked. She has never used smokeless tobacco. She reports that she drinks about 0.6 oz of alcohol per week. She reports that she does not use illicit drugs.  Outpatient Prescriptions Prior to Visit  Medication Sig Dispense Refill  . benazepril-hydrochlorthiazide (LOTENSIN HCT) 20-12.5 MG tablet Take one twice daily for blood pressure 180 tablet 0  . carvedilol (COREG) 6.25 MG tablet Take 1  tablet (6.25 mg total) by mouth 2 (two) times daily with a meal. 180 tablet 3  . hydrALAZINE (APRESOLINE) 25 MG tablet Take 2 in the morning, 1 at lunch, and 2 at dinner for blood pressure 450 tablet 3  . ipratropium (ATROVENT) 0.03 % nasal spray Place 2 sprays into both nostrils 2 (two) times daily. 30 mL 0  . levothyroxine (SYNTHROID) 100 MCG tablet Take 1 tablet (100 mcg total) by mouth daily before breakfast. 30 tablet 6  . liothyronine (CYTOMEL) 5 MCG tablet TAKE 1 TABLET BY MOUTH DAILY FOR THYROID 90 tablet 3  . Vitamin D, Ergocalciferol, (DRISDOL) 50000 UNITS CAPS capsule Take 1 capsule (50,000 Units total) by mouth every 7 (seven) days. Take for 12 weeks 12 capsule 0   No facility-administered medications prior to visit.    Social History   Social History  . Marital Status: Divorced    Spouse Name: N/A  . Number of Children: N/A  . Years of Education: N/A   Social History Main Topics  . Smoking status: Never Smoker   . Smokeless tobacco: Never Used  . Alcohol Use: 0.6 oz/week    1 Glasses of wine per week     Comment: occasional (1-2 times per week)  . Drug Use: No  . Sexual Activity: No   Other Topics Concern  . None   Social History Narrative   Divorced. Education: [employer]. Exercise: Yoga 2 times a week.     Review of Systems  Constitutional: Negative for fever, chills and appetite change.  HENT: Negative for congestion, ear pain, postnasal drip, sinus pressure and sore throat.   Eyes: Negative for pain and redness.  Respiratory: Negative for cough, shortness  of breath and wheezing.   Cardiovascular: Negative for leg swelling.  Gastrointestinal: Negative for nausea, vomiting, abdominal pain, diarrhea, constipation and blood in stool.  Endocrine: Negative for polyuria.  Genitourinary: Negative for dysuria, urgency, frequency and flank pain.  Musculoskeletal: Negative for gait problem.  Skin: Negative for rash.  Neurological: Negative for weakness and headaches.    Psychiatric/Behavioral: Negative for confusion and decreased concentration. The patient is not nervous/anxious.     Objective:  BP 132/84 mmHg  Pulse 58  Temp(Src) 98 F (36.7 C) (Oral)  Resp 16  Ht 5\' 1"  (1.549 m)  Wt 180 lb (81.647 kg)  BMI 34.03 kg/m2  SpO2 99%  Physical Exam  Constitutional: She is oriented to person, place, and time. She appears well-developed and well-nourished.  HENT:  Head: Normocephalic and atraumatic.  Eyes: Conjunctivae are normal. Pupils are equal, round, and reactive to light.  Pulmonary/Chest: Effort normal.  Musculoskeletal: She exhibits no edema.  Neurological: She is alert and oriented to person, place, and time.  Skin: Skin is dry.  Psychiatric: She has a normal mood and affect. Her behavior is normal. Thought content normal.      Assessment & Plan:   Vila was seen today for blood work, scratch, immunizations and medication refill.  Diagnoses and all orders for this visit:  Hypothyroidism, unspecified hypothyroidism type -     TSH  Need for Tdap vaccination  Other orders -     Tdap vaccine greater than or equal to 7yo IM   I am having Ms. Jankowski maintain her hydrALAZINE, carvedilol, ipratropium, liothyronine, levothyroxine, Vitamin D (Ergocalciferol), and benazepril-hydrochlorthiazide.  No orders of the defined types were placed in this encounter.    Appropriate red flag conditions were discussed with the patient as well as actions that should be taken.  Patient expressed his understanding.  Follow-up: Return if symptoms worsen or fail to improve.  Roselee Culver, MD   Results for orders placed or performed in visit on 02/12/15  TSH  Result Value Ref Range   TSH 3.549 0.350 - 4.500 uIU/mL

## 2015-02-12 NOTE — Patient Instructions (Signed)

## 2015-02-13 LAB — TSH: TSH: 3.549 u[IU]/mL (ref 0.350–4.500)

## 2015-02-15 NOTE — Telephone Encounter (Signed)
Called radiology and spoke to Dr. Martinique who will look into this for her- indeed she does not have any hardware

## 2015-02-19 ENCOUNTER — Telehealth: Payer: Self-pay

## 2015-02-19 NOTE — Telephone Encounter (Signed)
Patient called in wanting to know if the Dr wanted to up her levothyroxine (SYNTHROID) 100 MCG tablet because she stated she just got her results off mychart and she still thinks her levels are low and she still doesn't feel good and doesn't have any enegry, she would like someone to give her a call back to discuss this. Thank you!  Her call back number is [phone].

## 2015-02-20 ENCOUNTER — Encounter: Payer: Self-pay | Admitting: Family Medicine

## 2015-02-20 DIAGNOSIS — E034 Atrophy of thyroid (acquired): Secondary | ICD-10-CM

## 2015-02-22 ENCOUNTER — Other Ambulatory Visit: Payer: Self-pay | Admitting: Family Medicine

## 2015-02-22 ENCOUNTER — Telehealth: Payer: Self-pay

## 2015-02-22 MED ORDER — LEVOTHYROXINE SODIUM 112 MCG PO TABS: 112.0000 ug | ORAL_TABLET | Freq: Every day | ORAL | Status: DC

## 2015-02-22 NOTE — Telephone Encounter (Signed)
Pt advised.

## 2015-02-22 NOTE — Telephone Encounter (Signed)
Pt is needing a response back about her labs and the message she put on my chart

## 2015-02-22 NOTE — Addendum Note (Signed)
Addended by: Roselee Culver on: 02/22/2015 08:51 AM   Modules accepted: Orders

## 2015-02-22 NOTE — Telephone Encounter (Signed)
Sent a refill as 112.  Follow up in 3 months for recheck

## 2015-02-23 ENCOUNTER — Other Ambulatory Visit: Payer: Self-pay | Admitting: Family Medicine

## 2015-02-23 NOTE — Addendum Note (Signed)
Addended by: Lamar Blinks C on: 02/23/2015 08:17 AM   Modules accepted: Orders

## 2015-02-25 ENCOUNTER — Other Ambulatory Visit: Payer: Self-pay | Admitting: Family Medicine

## 2015-02-25 ENCOUNTER — Telehealth: Payer: Self-pay

## 2015-02-25 DIAGNOSIS — N950 Postmenopausal bleeding: Secondary | ICD-10-CM

## 2015-02-25 NOTE — Telephone Encounter (Signed)
Pt would like a referral to go see Dr. [name] for vaginal bleeding. I let her know she may need to come in for an OV, she still wants a CB about this issue.  # [phone]

## 2015-03-01 NOTE — Telephone Encounter (Signed)
She needs to come in, correct? I do not see that she was evaluated for this.

## 2015-03-01 NOTE — Telephone Encounter (Signed)
Called her back- left detailed message. I agree that this needs eval by OBG.  Will place referral to Dr. [name]. If just spotting ok to wait for appt- if more significant bleeding please come in for eval sooner.

## 2015-03-02 ENCOUNTER — Telehealth: Payer: Self-pay | Admitting: Family Medicine

## 2015-03-02 DIAGNOSIS — G4733 Obstructive sleep apnea (adult) (pediatric): Secondary | ICD-10-CM | POA: Diagnosis not present

## 2015-03-02 NOTE — Telephone Encounter (Signed)
Patient says that the bleeding has slowed down and that she still wants a referral for Dr. [name].  [phone]

## 2015-03-02 NOTE — Telephone Encounter (Signed)
Dr. Lorelei Pont does pt need to come in?

## 2015-03-02 NOTE — Telephone Encounter (Signed)
She did bleed for a couple of days- it was only a spoonfull and has stopped now. Referral is done

## 2015-03-03 ENCOUNTER — Telehealth: Payer: Self-pay

## 2015-03-03 NOTE — Telephone Encounter (Signed)
Pamala Hurry from [place] is calling because patient was referred to see Dr. [name] and is out of the office until January 4th. Patient needs to be informed. Also, Dr. [name] is no longer taking medicare patients. Pamala Hurry states that she'll have to leave a note on Dr. Paulla Dolly desk to see if she will be willing to see patient or not.

## 2015-03-26 ENCOUNTER — Encounter: Payer: Self-pay | Admitting: Family Medicine

## 2015-03-31 ENCOUNTER — Encounter: Payer: Self-pay | Admitting: Family Medicine

## 2015-04-14 ENCOUNTER — Ambulatory Visit (INDEPENDENT_AMBULATORY_CARE_PROVIDER_SITE_OTHER): Payer: Medicare Other | Admitting: Internal Medicine

## 2015-04-14 ENCOUNTER — Encounter: Payer: Self-pay | Admitting: Internal Medicine

## 2015-04-14 VITALS — BP 132/80 | HR 70 | Temp 97.7°F | Resp 12 | Ht 61.0 in | Wt 183.0 lb

## 2015-04-14 DIAGNOSIS — E039 Hypothyroidism, unspecified: Secondary | ICD-10-CM | POA: Diagnosis not present

## 2015-04-14 LAB — TSH: TSH: 1.34 u[IU]/mL (ref 0.35–4.50)

## 2015-04-14 LAB — T3, FREE: T3 FREE: 3.6 pg/mL (ref 2.3–4.2)

## 2015-04-14 LAB — T4, FREE: Free T4: 1.37 ng/dL (ref 0.60–1.60)

## 2015-04-14 MED ORDER — LEVOTHYROXINE SODIUM 112 MCG PO TABS: 112.0000 ug | ORAL_TABLET | Freq: Every day | ORAL | Status: DC

## 2015-04-14 MED ORDER — LIOTHYRONINE SODIUM 5 MCG PO TABS: ORAL_TABLET | ORAL | Status: DC

## 2015-04-14 NOTE — Patient Instructions (Addendum)
Please stop at the lab.   Please continue: - Levothyroxine 112 mcg daily - Cytomel 5 mg daily  Take the thyroid hormone every day, with water, at least 30 minutes before breakfast, separated by at least 4 hours from: - acid reflux medications - calcium - iron - multivitamins  Please come back for a follow-up appointment in 6 months.

## 2015-04-14 NOTE — Progress Notes (Signed)
Patient ID: [name], female   DOB: Mar 12, 1945, 70 y.o.   MRN: [id number]   HPI  [name] is a 70 y.o.-year-old female, referred by her PCP, Dr. Lorelei Pont, for management of hypothyroidism.  Pt. has been dx with hypothyroidism "decades ago"; is on Levothyroxine 112 mcg (increased in 02/2015 from 100 mcg LT4) + Cytomel 5 mg (total dose equivalent of 132 mcg LT4 daily), taken: - fasting - with water - separated by 60 min from b'fast  - no calcium  - takes this for pain  - no iron - + PPIs - no multivitamins (takes C and D vitamins)  I reviewed pt's thyroid tests: Lab Results  Component Value Date   TSH 3.549 02/12/2015   TSH 5.529* 12/02/2014   TSH 2.161 05/30/2014   TSH 1.906 08/18/2013   TSH 2.830 04/30/2013   TSH 1.762 10/05/2012   TSH 0.698 08/16/2012   TSH 1.291 07/10/2011   FREET4 1.71 04/30/2013   FREET4 1.45 10/05/2012   FREET4 1.39 08/16/2012   She has poor memory even if the TSH is at the ULN. She feels much better after the last increase in dose. She would like to do anything to avoid loss of memory and would like to keep her TFTs where they are now (will need to check today).  The dose of Cytomel was decreased few years ago as she was not sleeping well.  Pt describes: - + weight gain - + fatigue - + cold intolerance - no depression, + anxiety - + constipation - no dry skin - no hair loss - + poor sleep  Pt denies feeling nodules in neck, hoarseness, dysphagia/odynophagia, SOB with lying down. Feels discomfort in the back of her throat.  She has no FH of thyroid disorders. No FH of thyroid cancer.  No h/o radiation tx to head or neck. No recent use of iodine supplements.  ROS: Constitutional: + see HPI, + excessive urination (on a diuretic0 Eyes: no blurry vision, no xerophthalmia ENT: no sore throat, no nodules palpated in throat, no dysphagia/odynophagia, no hoarseness Cardiovascular: no CP/SOB/palpitations/leg swelling Respiratory: no  cough/SOB Gastrointestinal: no N/V/D/C Musculoskeletal: no muscle/+ joint aches - knees Skin: no rashes Neurological: no tremors/numbness/tingling/dizziness Psychiatric: no depression/+ anxiety  Past Medical History  Diagnosis Date  . Environmental allergies   . Anemia   . Anxiety   . Osteopenia 02/2012    -1.8 T score right femur neck  . Hypothyroidism   . Sleep apnea   . Vitamin D deficiency     history of  . Obesity   . Interstitial cystitis   . H/O varicella   . History of measles, mumps, or rubella   . Fibroid 02/19/03  . Dysuria 2004  . Menopausal symptoms 2004  . Abnormal perimenopausal bleeding 2005  . Endometrial polyp 2006  . Urinary incontinence 2011  . PMB (postmenopausal bleeding) 06/21/10  . Hypertension 07-19-2010    echo for pre-op EF 55%  normal left wall thickness LA mildly dialated with mitral and tricuspid regurgatation  . Allergy   . Hx of colonic polyps 12/15/2010   Past Surgical History  Procedure Laterality Date  . Myomectomy  1974  . Hysteroscopy  2006 and May 2012    for fibroids, endometrial polyps  . Mouth surgery    . Colonoscopy  12/15/10   Social History   Social History  . Marital Status: Divorced    Spouse Name: N/A  . Number of Children: 0   Occupational History  .  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 Smoking status: Never Smoker   . Smokeless tobacco: Never Used  . Alcohol Use: 0.6 oz/week    1 Glasses of wine per week     Comment: occasional (1-2 times per week)  . Drug Use: No   Social History Narrative   Divorced. Education: [employer]. Exercise: Yoga 2 times a week.   Current Outpatient Prescriptions on File Prior to Visit  Medication Sig Dispense Refill  . benazepril-hydrochlorthiazide (LOTENSIN HCT) 20-12.5 MG tablet Take one twice daily for blood pressure 180 tablet 0  . carvedilol (COREG) 6.25 MG tablet TAKE 1 TABLET (6.25 MG TOTAL) BY MOUTH 2 (TWO) TIMES DAILY WITH A MEAL  180 tablet 0  . hydrALAZINE (APRESOLINE) 25 MG tablet Take 2 in the morning, 1 at lunch, and 2 at dinner for blood pressure 450 tablet 3  . ipratropium (ATROVENT) 0.03 % nasal spray Place 2 sprays into both nostrils 2 (two) times daily. 30 mL 0  . levothyroxine (SYNTHROID, LEVOTHROID) 112 MCG tablet Take 1 tablet (112 mcg total) by mouth daily before breakfast. 90 tablet 0  . liothyronine (CYTOMEL) 5 MCG tablet TAKE 1 TABLET BY MOUTH DAILY FOR THYROID 90 tablet 3  . liothyronine (CYTOMEL) 5 MCG tablet TAKE 1 TABLET BY MOUTH DAILY FOR THYROID 90 tablet 1  . Vitamin D, Ergocalciferol, (DRISDOL) 50000 UNITS CAPS capsule Take 1 capsule (50,000 Units total) by mouth every 7 (seven) days. Take for 12 weeks 12 capsule 0   No current facility-administered medications on file prior to visit.   Allergies  Allergen Reactions  . Bactrim [Sulfamethoxazole-Trimethoprim]   . Epinephrine     shakey  . Erythromycin   . Sulfa Antibiotics Rash   Family History  Problem Relation Age of Onset  . Colon cancer Paternal Uncle   . Kidney disease Father     from uremia  . Benign prostatic hyperplasia Father   . Clotting disorder Maternal Grandmother     ????  . Hypertension Mother    PE: BP 132/80 mmHg  Pulse 70  Temp(Src) 97.7 F (36.5 C) (Oral)  Resp 12  Ht 5\' 1"  (1.549 m)  Wt 183 lb (83.008 kg)  BMI 34.60 kg/m2  SpO2 99% Wt Readings from Last 3 Encounters:  04/14/15 183 lb (83.008 kg)  02/12/15 180 lb (81.647 kg)  12/02/14 182 lb 3.2 oz (82.645 kg)   Constitutional: overweight, in NAD Eyes: PERRLA, EOMI, no exophthalmos ENT: moist mucous membranes, slight symmetric thyromegaly, no cervical lymphadenopathy Cardiovascular: RRR, No MRG Respiratory: CTA B Gastrointestinal: abdomen soft, NT, ND, BS+ Musculoskeletal: no deformities, strength intact in all 4 Skin: moist, warm, no rashes Neurological: no tremor with outstretched hands, DTR normal in all 4  ASSESSMENT: 1.  Hypothyroidism  PLAN:  1. Patient with long-standing hypothyroidism, on LT4 + LT3 therapy. She appears euthyroid but c/o memory loss when TSH heads towards the ULN. She had a TSH of 3.5 in 02/2015 and she had memory loss with this value. Her dose of LT4 was increased by 12 mcg and her memory improved and he is feeling much better. She would like to maintain her TSH where it is now. She does not appear to have a goiter, thyroid nodules, or neck compression symptoms. She denies palpitations, weight loss with the L3, but has anxiety and occasional insomnia. She takes the LT3 in am, along with the LT4. - We discussed about correct intake of levothyroxine, fasting,  with water, separated by at least 30 minutes from breakfast, and separated by more than 4 hours from calcium, iron, multivitamins, acid reflux medications (PPIs). She is taking it correctly. - will check thyroid tests today: TSH, free T4, free T3 - If these are abnormal, she will need to return in 5-6 weeks for repeat labs - If these are normal, I will see her back in 6 months  Needs refills when labs are back.  Office Visit on 04/14/2015  Component Date Value Ref Range Status  . Free T4 04/14/2015 1.37  0.60 - 1.60 ng/dL Final  . TSH 04/14/2015 1.34  0.35 - 4.50 uIU/mL Final  . T3, Free 04/14/2015 3.6  2.3 - 4.2 pg/mL Final   Msg sent: Dear [name], Labs are excellent! I will refill your thyroid hormones. We can repeat the tests when you come back in 6 months but please let me know if you feel we need to recheck them sooner. Sincerely, [name] MD

## 2015-05-01 ENCOUNTER — Ambulatory Visit (INDEPENDENT_AMBULATORY_CARE_PROVIDER_SITE_OTHER): Payer: Medicare Other | Admitting: Physician Assistant

## 2015-05-01 VITALS — BP 128/72 | HR 78 | Temp 98.3°F | Resp 14 | Ht 61.0 in | Wt 181.0 lb

## 2015-05-01 DIAGNOSIS — R059 Cough, unspecified: Secondary | ICD-10-CM

## 2015-05-01 DIAGNOSIS — I10 Essential (primary) hypertension: Secondary | ICD-10-CM | POA: Diagnosis not present

## 2015-05-01 DIAGNOSIS — R05 Cough: Secondary | ICD-10-CM | POA: Diagnosis not present

## 2015-05-01 MED ORDER — HYDRALAZINE HCL 25 MG PO TABS: ORAL_TABLET | ORAL | Status: DC

## 2015-05-01 MED ORDER — BENZONATATE 100 MG PO CAPS: 100.0000 mg | ORAL_CAPSULE | Freq: Three times a day (TID) | ORAL | Status: DC | PRN

## 2015-05-01 MED ORDER — CARVEDILOL 6.25 MG PO TABS: ORAL_TABLET | ORAL | Status: DC

## 2015-05-01 MED ORDER — BENAZEPRIL-HYDROCHLOROTHIAZIDE 20-12.5 MG PO TABS: ORAL_TABLET | ORAL | Status: DC

## 2015-05-01 NOTE — Patient Instructions (Addendum)
Your last physical was 12/02/2014.  Resume the Atrovent nasal spray. Use 2 sprays in each nostril twice each day. Add Mucinex to help thin the mucous, making it easier to drain, easier to cough it up and less irritating on the back of the throat. Continue to drink lots of water, blow your nose when you feel the need. Rest your voice.  If the cough persists, we'll need to consider the possibility that the benazepril it the culprit.

## 2015-05-01 NOTE — Progress Notes (Signed)
Patient ID: [name], female    DOB: 06/29/1945, 70 y.o.   MRN: [id number]  PCP: Lamar Blinks, MD  Subjective:   Chief Complaint  Patient presents with  . Cough    since [place] yellow  . Medication Refill    coreg and benazerpril  . Nasal Congestion    HPI Presents for evaluation of cough and congestion, post-nasal drainage x 4-5 days.  Started with a sore throat, which she thinks was caused by coughing, and feeling that her throat was swollen. She used a vaporizer, which helped x 2 days, but has spasms of coughing that she can't stop. Nasal congestion began Tuesday (04/28/2015). Drainage is clear, but cough is productive of yellowish sputum, when she is successful in bringing it up. Had leftover Atrovent NS, which helped "break it up," but then she stopped it, unsure how long it was ok to use it.[place]ton Scrape like she has a post-nasal drip, but "just can't seem to cough it up." Chicken soup and Vitamin C are helping a little. Gargled with salt water. Increased fluids. Talking makes it worse. 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 "I must be well by then."  Had low grade fever on Tuesday/Wednesday 99.4, and chills that night. None since. Of note, she is on an ACEI. Never smoker.   Review of Systems As above.    Patient Active Problem List   Diagnosis Date Noted  . Essential hypertension, benign 04/28/2012  . Osteoarthritis of left knee 04/28/2012  . Unspecified hypothyroidism 04/28/2012  . Hx of colonic polyps 12/15/2010  . Anxiety 11/17/2010  . Sleep apnea 11/17/2010     Prior to Admission medications   Medication Sig Start Date End Date Taking? Authorizing Provider  benazepril-hydrochlorthiazide (LOTENSIN HCT) 20-12.5 MG tablet Take one twice daily for blood pressure 12/05/14  Yes Jessica C Copland, MD  carvedilol (COREG) 6.25 MG tablet TAKE 1 TABLET (6.25 MG TOTAL) BY MOUTH 2 (TWO) TIMES  DAILY WITH A MEAL 02/23/15  Yes Posey Boyer, MD  hydrALAZINE (APRESOLINE) 25 MG tablet Take 2 in the morning, 1 at lunch, and 2 at dinner for blood pressure 05/30/14  Yes Posey Boyer, MD  ipratropium (ATROVENT) 0.03 % nasal spray Place 2 sprays into both nostrils 2 (two) times daily. 07/22/14  Yes Bennett Scrape V, PA-C  levothyroxine (SYNTHROID, LEVOTHROID) 112 MCG tablet Take 1 tablet (112 mcg total) by mouth daily before breakfast. 04/14/15  Yes [name], MD  liothyronine (CYTOMEL) 5 MCG tablet TAKE 1 TABLET BY MOUTH DAILY FOR THYROID 12/02/14  Yes Gay Filler Copland, MD  liothyronine (CYTOMEL) 5 MCG tablet TAKE 1 TABLET BY MOUTH DAILY IN AM 04/14/15  Yes [name], MD  Vitamin D, Ergocalciferol, (DRISDOL) 50000 UNITS CAPS capsule Take 1 capsule (50,000 Units total) by mouth every 7 (seven) days. Take for 12 weeks 12/05/14  Yes Darreld Mclean, MD     Allergies  Allergen Reactions  . Bactrim [Sulfamethoxazole-Trimethoprim]   . Epinephrine     shakey  . Erythromycin   . Sulfa Antibiotics Rash       Objective:  Physical Exam  Constitutional: She is oriented to person, place, and time. She appears well-developed and well-nourished. She is active and cooperative. No distress.  BP 128/72 mmHg  Pulse 78  Temp(Src) 98.3 F (36.8 C) (Oral)  Resp 14  Ht 5\' 1"  (1.549 m)  Wt 181 lb (82.101 kg)  BMI 34.22 kg/m2  SpO2 97%  HENT:  Head: Normocephalic and atraumatic.  Right Ear: Hearing, tympanic membrane, external ear and ear canal normal.  Left Ear: Hearing, tympanic membrane, external ear and ear canal normal.  Nose: Mucosal edema (mild) present. No rhinorrhea. Right sinus exhibits no maxillary sinus tenderness and no frontal sinus tenderness. Left sinus exhibits no maxillary sinus tenderness and no frontal sinus tenderness.  Mouth/Throat: Uvula is midline, oropharynx is clear and moist and mucous membranes are normal. No oral lesions. No uvula swelling.  Eyes: Conjunctivae  are normal. No scleral icterus.  Neck: Normal range of motion, full passive range of motion without pain and phonation normal. Neck supple. No thyromegaly present.  Cardiovascular: Normal rate, regular rhythm and normal heart sounds.   Pulses:      Radial pulses are 2+ on the right side, and 2+ on the left side.  Pulmonary/Chest: Effort normal and breath sounds normal.  Lymphadenopathy:       Head (right side): No tonsillar, no preauricular, no posterior auricular and no occipital adenopathy present.       Head (left side): No tonsillar, no preauricular, no posterior auricular and no occipital adenopathy present.    She has no cervical adenopathy.       Right: No supraclavicular adenopathy present.       Left: No supraclavicular adenopathy present.  Neurological: She is alert and oriented to person, place, and time. No sensory deficit.  Skin: Skin is warm, dry and intact. No rash noted. No cyanosis or erythema. Nails show no clubbing.  Psychiatric: She has a normal mood and affect. Her speech is normal and behavior is normal.  Quite verbose.            Assessment & Plan:   1. Essential hypertension, benign Controlled. Continue current regimen. If cough persists, would d/c ACEI and start ARB. - hydrALAZINE (APRESOLINE) 25 MG tablet; Take 2 in the morning, 1 at lunch, and 2 at dinner for blood pressure  Dispense: 450 tablet; Refill: 1 - carvedilol (COREG) 6.25 MG tablet; TAKE 1 TABLET (6.25 MG TOTAL) BY MOUTH 2 (TWO) TIMES DAILY WITH A MEAL  Dispense: 180 tablet; Refill: 1 - benazepril-hydrochlorthiazide (LOTENSIN HCT) 20-12.5 MG tablet; Take one twice daily for blood pressure  Dispense: 180 tablet; Refill: 1  2. Cough Viral URI vs ACEI cough. The patient is certain that this is viral and caused by drainage. So, advised to resume the Atrovent NS, add Mucinex, voice rest, fluids. Add [employer], which is "the only thing that works for me," though she is concerned about developing  hallucinations on it, as her sister did. Advised that if her symptoms persist, I would recommend changing the BP regimen. - benzonatate (TESSALON) 100 MG capsule; Take 1-2 capsules (100-200 mg total) by mouth 3 (three) times daily as needed for cough.  Dispense: 40 capsule; Refill: 0  She plans to follow Dr. Lorelei Pont for primary care, and come here "only when it's an emergency, like this."    Fara Chute, PA-C Physician Assistant-Certified [facility]

## 2015-05-12 ENCOUNTER — Telehealth: Payer: Self-pay

## 2015-05-12 NOTE — Telephone Encounter (Signed)
.   Cough Viral URI vs ACEI cough. The patient is certain that this is viral and caused by drainage. So, advised to resume the Atrovent NS, add Mucinex, voice rest, fluids. Add [employer], which is "the only thing that works for me," though she is concerned about developing hallucinations on it, as her sister did. Advised that if her symptoms persist, I would recommend changing the BP regimen. - benzonatate (TESSALON) 100 MG capsule; Take 1-2 capsules (100-200 mg total) by mouth 3 (three) times daily as needed for cough. Dispense: 40 capsule; Refill: 0  She plans to follow Dr. Lorelei Pont for primary care, and come here "only when it's an emergency, like this."

## 2015-05-12 NOTE — Telephone Encounter (Signed)
Pt is thought when she was seen recently that a nasal spray was supposed to be called in but it wasn't she is calling to make sure if she needs it or not   Best number [phone]

## 2015-05-12 NOTE — Telephone Encounter (Signed)
Left message advising pt.  Atrovent Nasal spray

## 2015-05-13 NOTE — Telephone Encounter (Signed)
Pt states she got the message to take the nasal spray but she does not have any and needs it called in to [facility] at [address]

## 2015-05-14 ENCOUNTER — Other Ambulatory Visit: Payer: Self-pay | Admitting: Physician Assistant

## 2015-05-14 MED ORDER — IPRATROPIUM BROMIDE 0.03 % NA SOLN: 2.0000 | Freq: Two times a day (BID) | NASAL | Status: DC

## 2015-05-14 NOTE — Telephone Encounter (Signed)
Pt advised.

## 2015-05-14 NOTE — Telephone Encounter (Signed)
Lisette Abu, MS, PA-C 8:44 AM, 05/14/2015

## 2015-05-19 ENCOUNTER — Telehealth: Payer: Self-pay

## 2015-05-19 NOTE — Telephone Encounter (Signed)
Spoke with pt, advised message. Pt understood. 

## 2015-05-19 NOTE — Telephone Encounter (Signed)
She should be very careful with changing position as it is possible that her BP will go low.  I would not take her noon medications and I would have her monitor her BP.  If it is high tonight she should take both of these medications but if it is low or she feels light headed she should wait until tomorrow morning.

## 2015-05-19 NOTE — Telephone Encounter (Signed)
Pt took a double dose of her meds this morning hydrALAZINE (APRESOLINE) 25 MG tablet [id number] and her carvedilol (COREG) 6.25 MG tablet [id number]. She has called Production designer, theatre[employer]television[employer]. She would like to know if she should take her lunch time medication. CB #  [phone]

## 2015-05-20 ENCOUNTER — Telehealth: Payer: Self-pay

## 2015-05-20 NOTE — Telephone Encounter (Signed)
Pt is needing to let us know that a prior authorization for lotensin to take twice a day will be coming from the pharmacy   Best number [phone]

## 2015-05-21 NOTE — Telephone Encounter (Signed)
Shelia called from [employer]. She needs clinical information and ICD codes for this med-benazepril-hydrochlorthiazide (LOTENSIN HCT) 20-12.5 MG tablet [id number]. The case # is [id number] and the # to reach her is [phone]

## 2015-05-25 NOTE — Telephone Encounter (Signed)
Completed PA on the phone and case is pending. Should have answer w/in 48 hours.

## 2015-06-02 NOTE — Telephone Encounter (Signed)
I had not received a decision from ins so called to check status. PA was denied for quantity limit (over the amount on manufacturer's instr's and/or FDA approved dosage). Chelle, do you want to try Rxing the two meds separately or something else?

## 2015-06-03 NOTE — Telephone Encounter (Signed)
Please let the patient know what's going on. If she's ok with getting the two medications separately, please proceed with that.

## 2015-06-07 NOTE — Telephone Encounter (Signed)
[name], pt would rather keep the Rxs together, not just for ease, but so that she doesn't have two co-pays. I did check GoodRx prices for her and called [facility] and it is more expensive for her to get the two separate Rxs. Pt would like for [place] to do an appeal if possible. I will be glad to find out where to send an appeal letter if you want to write one, or tell me what you want for me to say (other than pt needs BID dosing to control her BP and benazepril/HCTZ does not even come in a 40 - 25 mg to take QD) and I can write the letter.

## 2015-06-09 ENCOUNTER — Other Ambulatory Visit: Payer: Self-pay | Admitting: Obstetrics and Gynecology

## 2015-06-09 DIAGNOSIS — N950 Postmenopausal bleeding: Secondary | ICD-10-CM

## 2015-06-12 NOTE — Telephone Encounter (Signed)
Patient called stating she spoke with [facility] and was told in order for her prescription to be authorized an MD would need to contact them for an appeal. Patient someone to contact [facility] [phone], ask for expedite appeal on quantity limit exception. Patient states she has to take 2 pills a day and her insurance is only approving 1 a day on the "Lotensin". Patient requesting a call back to let her know the status at [phone].

## 2015-06-15 NOTE — Telephone Encounter (Signed)
Pt called back to check status. Chelle, can you please write a letter of appeal and I will send it in for expedited review. I can explain that pt needs BID dosing but not sure what to say is the reason the two Rxs can not be written separately, compliance (due to higher cost)?

## 2015-06-16 NOTE — Telephone Encounter (Signed)
Letter routed to you.

## 2015-06-17 NOTE — Telephone Encounter (Signed)
Faxed appeal letter to [employer] dept after checking w/[facility] for correct fax # [phone]. Received confirmation of receipt.

## 2015-06-18 NOTE — Telephone Encounter (Signed)
Appeal overturned PA denial and coverage is approved for Lotensin. Notified pt on VM.

## 2015-06-23 ENCOUNTER — Ambulatory Visit
Admission: RE | Admit: 2015-06-23 | Discharge: 2015-06-23 | Disposition: A | Discharge status: Home / Self Care | Payer: Medicare Other | Source: Ambulatory Visit | Attending: Obstetrics and Gynecology | Admitting: Obstetrics and Gynecology

## 2015-06-23 DIAGNOSIS — N950 Postmenopausal bleeding: Secondary | ICD-10-CM

## 2015-06-23 MED ORDER — GADOBENATE DIMEGLUMINE 529 MG/ML IV SOLN
17.0000 mL | Freq: Once | INTRAVENOUS | Status: AC | PRN
  Administered 2015-06-23: 17 mL via INTRAVENOUS

## 2015-06-26 ENCOUNTER — Other Ambulatory Visit: Payer: Self-pay | Admitting: Physician Assistant

## 2015-10-12 ENCOUNTER — Encounter: Payer: Self-pay | Admitting: Internal Medicine

## 2015-10-12 ENCOUNTER — Ambulatory Visit (INDEPENDENT_AMBULATORY_CARE_PROVIDER_SITE_OTHER): Payer: Medicare Other | Admitting: Internal Medicine

## 2015-10-12 VITALS — BP 132/72 | HR 58 | Wt 182.0 lb

## 2015-10-12 DIAGNOSIS — E038 Other specified hypothyroidism: Secondary | ICD-10-CM

## 2015-10-12 NOTE — Progress Notes (Signed)
Patient ID: [name], female   DOB: 1946-01-16, 70 y.o.   MRN: [id number]   HPI  [name] is a 70 y.o.-year-old female, initially referred by her PCP, Dr. Lorelei Pont, for management of hypothyroidism. Last visit 6 mo ago.  Pt. has been dx with hypothyroidism "decades ago"; is on Levothyroxine 112 mcg (increased in 02/2015 from 100 mcg LT4) + Cytomel 5 mg (total dose equivalent of 132 mcg LT4 daily), taken: - fasting - with water - separated by 60 min from b'fast  - no calcium   - no iron - no PPIs - no multivitamins   I reviewed pt's thyroid tests: Lab Results  Component Value Date   TSH 1.34 04/14/2015   TSH 3.549 02/12/2015   TSH 5.529 (H) 12/02/2014   TSH 2.161 05/30/2014   TSH 1.906 08/18/2013   TSH 2.830 04/30/2013   TSH 1.762 10/05/2012   TSH 0.698 08/16/2012   TSH 1.291 07/10/2011   FREET4 1.37 04/14/2015   FREET4 1.71 04/30/2013   FREET4 1.45 10/05/2012   FREET4 1.39 08/16/2012   She has poor memory even when the TSH is at the ULN. She feels much better after the last increase in dose in 02/2015. She would like to do anything to avoid loss of memory and would like to keep her TFTs where they are now.  The dose of Cytomel was decreased few years ago as she was not sleeping well.  Pt has no complaints today other than occasionally waking up at night and not being able to fall back asleep.  Pt denies feeling nodules in neck, hoarseness, dysphagia/odynophagia, SOB with lying down. Feels discomfort in the back of her throat, she believes from post nasal drip.  She has no FH of thyroid disorders. No FH of thyroid cancer.  No h/o radiation tx to head or neck. No recent use of iodine supplements.  ROS: Constitutional: + see HPI Eyes: no blurry vision, no xerophthalmia ENT: no sore throat, no nodules palpated in throat, no dysphagia/odynophagia, no hoarseness Cardiovascular: no CP/SOB/palpitations/leg swelling Respiratory: no cough/SOB Gastrointestinal: no  N/V/D/C Musculoskeletal: no muscle/joint aches Skin: no rashes Neurological: no tremors/numbness/tingling/dizziness  I reviewed pt's medications, allergies, PMH, social hx, family hx, and changes were documented in the history of present illness. Otherwise, unchanged from my initial visit note.  Past Medical History:  Diagnosis Date  . Abnormal perimenopausal bleeding 2005  . Allergy   . Anemia   . Anxiety   . Dysuria 2004  . Endometrial polyp 2006  . Environmental allergies   . Fibroid 02/19/03  . H/O varicella   . History of measles, mumps, or rubella   . Hx of colonic polyps 12/15/2010  . Hypertension 07-19-2010   echo for pre-op EF 55%  normal left wall thickness LA mildly dialated with mitral and tricuspid regurgatation  . Hypothyroidism   . Interstitial cystitis   . Menopausal symptoms 2004  . Obesity   . Osteopenia 02/2012   -1.8 T score right femur neck  . PMB (postmenopausal bleeding) 06/21/10  . Sleep apnea   . Urinary incontinence 2011  . Vitamin D deficiency    history of   Past Surgical History:  Procedure Laterality Date  . COLONOSCOPY  12/15/10  . HYSTEROSCOPY  2006 and May 2012   for fibroids, endometrial polyps  . MOUTH SURGERY    . MYOMECTOMY  1974   Social History   Social History  . Marital Status: Divorced    Spouse Name: N/A  .  Number of Children: 0   Occupational History  . 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 Smoking status: Never Smoker   . Smokeless tobacco: Never Used  . Alcohol Use: 0.6 oz/week    1 Glasses of wine per week     Comment: occasional (1-2 times per week)  . Drug Use: No   Social History Narrative   Divorced. Education: [employer]. Exercise: Yoga 2 times a week.   Current Outpatient Prescriptions on File Prior to Visit  Medication Sig Dispense Refill  . benazepril-hydrochlorthiazide (LOTENSIN HCT) 20-12.5 MG tablet Take one twice daily for blood pressure 180 tablet 1  .  carvedilol (COREG) 6.25 MG tablet TAKE 1 TABLET (6.25 MG TOTAL) BY MOUTH 2 (TWO) TIMES DAILY WITH A MEAL 180 tablet 1  . hydrALAZINE (APRESOLINE) 25 MG tablet Take 2 in the morning, 1 at lunch, and 2 at dinner for blood pressure 450 tablet 1  . levothyroxine (SYNTHROID, LEVOTHROID) 112 MCG tablet Take 1 tablet (112 mcg total) by mouth daily before breakfast. 90 tablet 1  . liothyronine (CYTOMEL) 5 MCG tablet TAKE 1 TABLET BY MOUTH DAILY FOR THYROID 90 tablet 3  . benzonatate (TESSALON) 100 MG capsule Take 1-2 capsules (100-200 mg total) by mouth 3 (three) times daily as needed for cough. (Patient not taking: Reported on 10/12/2015) 40 capsule 0  . ipratropium (ATROVENT) 0.03 % nasal spray Place 2 sprays into both nostrils every 12 (twelve) hours. (Patient not taking: Reported on 10/12/2015) 30 mL 12  . Vitamin D, Ergocalciferol, (DRISDOL) 50000 UNITS CAPS capsule Take 1 capsule (50,000 Units total) by mouth every 7 (seven) days. Take for 12 weeks (Patient not taking: Reported on 10/12/2015) 12 capsule 0   No current facility-administered medications on file prior to visit.    Allergies  Allergen Reactions  . Bactrim [Sulfamethoxazole-Trimethoprim]   . Epinephrine     shakey  . Erythromycin   . Sulfa Antibiotics Rash   Family History  Problem Relation Age of Onset  . Colon cancer Paternal Uncle   . Kidney disease Father     from uremia  . Benign prostatic hyperplasia Father   . Clotting disorder Maternal Grandmother     ????  . Hypertension Mother    PE: BP 132/72 (BP Location: Left Arm, Patient Position: Sitting)   Pulse (!) 58   Wt 182 lb (82.6 kg)   SpO2 98%   BMI 34.39 kg/m  Wt Readings from Last 3 Encounters:  10/12/15 182 lb (82.6 kg)  05/01/15 181 lb (82.1 kg)  04/14/15 183 lb (83 kg)   Constitutional: overweight, in NAD Eyes: PERRLA, EOMI, no exophthalmos ENT: moist mucous membranes, slight symmetric thyromegaly, no cervical lymphadenopathy Cardiovascular: RRR, No  MRG Respiratory: CTA B Gastrointestinal: abdomen soft, NT, ND, BS+ Musculoskeletal: no deformities, strength intact in all 4 Skin: moist, warm, no rashes Neurological: + mild tremor with outstretched hands, DTR normal in all 4  ASSESSMENT: 1. Hypothyroidism  PLAN:  1. Patient with long-standing hypothyroidism, on LT4 + LT3 therapy. She appears euthyroid but c/o memory loss when TSH heads towards the ULN. She had a TSH of 1.3 in 04/2015 and she feels much better at this level. She would like to maintain her TSH where it is now. She does not appear to have a goiter, thyroid nodules, or neck compression symptoms. She denies palpitations, weight loss with the L3, but has occasional insomnia. She takes the LT3 in am, along with  the LT4. - We discussed about correct intake of levothyroxine, fasting, with water, separated by at least 30 minutes from breakfast, and separated by more than 4 hours from calcium, iron, multivitamins, acid reflux medications (PPIs). She is taking it correctly. - she refuses to have thyroid tests checked today: TSH, free T4, free T3 >> would like to have them checked at [facility] with PCP next mo. Will need the levels for refills. -I will see her back in 1 year  Needs refills when labs are back.

## 2015-10-12 NOTE — Patient Instructions (Addendum)
Please have thyroid labs at next appt with your PCP.  Please continue: - Levothyroxine 112 mcg daily - Cytomel 5 mg daily  Take the thyroid hormone every day, with water, at least 30 minutes before breakfast, separated by at least 4 hours from: - acid reflux medications - calcium - iron - multivitamins  Please come back for a follow-up appointment in 1 year.

## 2015-11-09 ENCOUNTER — Telehealth: Payer: Self-pay

## 2015-11-09 NOTE — Telephone Encounter (Signed)
Patient is scheduled with Chelle on September 12 th at 2:30 for her Medicare PE.  Patient is having dental work that day and she will be in pain. Patient want to come in on Saturday to have her blood work done. [phone].

## 2015-11-10 NOTE — Telephone Encounter (Signed)
Advised patient she can come in for lab work before scheduled physical

## 2015-11-11 ENCOUNTER — Other Ambulatory Visit: Payer: Self-pay | Admitting: Internal Medicine

## 2015-11-11 DIAGNOSIS — E039 Hypothyroidism, unspecified: Secondary | ICD-10-CM

## 2015-11-13 ENCOUNTER — Ambulatory Visit (INDEPENDENT_AMBULATORY_CARE_PROVIDER_SITE_OTHER): Payer: Medicare Other | Admitting: Physician Assistant

## 2015-11-13 VITALS — BP 194/87 | HR 55

## 2015-11-13 DIAGNOSIS — I10 Essential (primary) hypertension: Secondary | ICD-10-CM

## 2015-11-13 DIAGNOSIS — E038 Other specified hypothyroidism: Secondary | ICD-10-CM

## 2015-11-13 DIAGNOSIS — Z23 Encounter for immunization: Secondary | ICD-10-CM | POA: Diagnosis not present

## 2015-11-13 LAB — POCT CBC
GRANULOCYTE PERCENT: 66.9 % (ref 37–80)
HEMATOCRIT: 37.4 % — AB (ref 37.7–47.9)
Hemoglobin: 13.2 g/dL (ref 12.2–16.2)
LYMPH, POC: 2.3 (ref 0.6–3.4)
MCH, POC: 28.2 pg (ref 27–31.2)
MCHC: 35.2 g/dL (ref 31.8–35.4)
MCV: 79.9 fL — AB (ref 80–97)
MID (CBC): 0.3 (ref 0–0.9)
MPV: 6.5 fL (ref 0–99.8)
POC GRANULOCYTE: 5.2 (ref 2–6.9)
POC LYMPH %: 28.9 % (ref 10–50)
POC MID %: 4.2 % (ref 0–12)
Platelet Count, POC: 245 10*3/uL (ref 142–424)
RBC: 4.68 M/uL (ref 4.04–5.48)
RDW, POC: 14.9 %
WBC: 7.8 10*3/uL (ref 4.6–10.2)

## 2015-11-13 LAB — POCT URINALYSIS DIP (MANUAL ENTRY)
BILIRUBIN UA: NEGATIVE
Blood, UA: NEGATIVE
GLUCOSE UA: NEGATIVE
Ketones, POC UA: NEGATIVE
Nitrite, UA: NEGATIVE
Protein Ur, POC: 30 — AB
SPEC GRAV UA: 1.015
UROBILINOGEN UA: 0.2
pH, UA: 7

## 2015-11-13 LAB — COMPLETE METABOLIC PANEL WITH GFR
ALBUMIN: 4.1 g/dL (ref 3.6–5.1)
ALK PHOS: 50 U/L (ref 33–130)
ALT: 11 U/L (ref 6–29)
AST: 10 U/L (ref 10–35)
BILIRUBIN TOTAL: 0.6 mg/dL (ref 0.2–1.2)
BUN: 17 mg/dL (ref 7–25)
CALCIUM: 9 mg/dL (ref 8.6–10.4)
CO2: 28 mmol/L (ref 20–31)
Chloride: 101 mmol/L (ref 98–110)
Creat: 0.7 mg/dL (ref 0.60–0.93)
GFR, EST NON AFRICAN AMERICAN: 87 mL/min (ref >=60)
GLUCOSE: 89 mg/dL (ref 65–99)
POTASSIUM: 3.6 mmol/L (ref 3.5–5.3)
SODIUM: 143 mmol/L (ref 135–146)
TOTAL PROTEIN: 6.5 g/dL (ref 6.1–8.1)

## 2015-11-13 LAB — T3, FREE: T3 FREE: 3.7 pg/mL (ref 2.3–4.2)

## 2015-11-13 LAB — POC MICROSCOPIC URINALYSIS (UMFC)

## 2015-11-13 LAB — T4, FREE: Free T4: 1.6 ng/dL (ref 0.8–1.8)

## 2015-11-13 LAB — TSH: TSH: 1.75 m[IU]/L

## 2015-11-13 NOTE — Progress Notes (Signed)
Lab only visit. Patient has CPE/?pre-op clearance with me scheduled for 11/16/15.

## 2015-11-14 ENCOUNTER — Other Ambulatory Visit: Payer: Self-pay | Admitting: Physician Assistant

## 2015-11-14 DIAGNOSIS — I10 Essential (primary) hypertension: Secondary | ICD-10-CM

## 2015-11-16 ENCOUNTER — Ambulatory Visit (INDEPENDENT_AMBULATORY_CARE_PROVIDER_SITE_OTHER): Payer: Medicare Other | Admitting: Physician Assistant

## 2015-11-16 ENCOUNTER — Encounter: Payer: Self-pay | Admitting: Physician Assistant

## 2015-11-16 VITALS — BP 142/80 | HR 60 | Temp 98.4°F | Resp 16 | Ht 60.0 in | Wt 181.0 lb

## 2015-11-16 DIAGNOSIS — Z0000 Encounter for general adult medical examination without abnormal findings: Secondary | ICD-10-CM

## 2015-11-16 DIAGNOSIS — G4730 Sleep apnea, unspecified: Secondary | ICD-10-CM | POA: Diagnosis not present

## 2015-11-16 DIAGNOSIS — F419 Anxiety disorder, unspecified: Secondary | ICD-10-CM | POA: Diagnosis not present

## 2015-11-16 DIAGNOSIS — R5382 Chronic fatigue, unspecified: Secondary | ICD-10-CM

## 2015-11-16 DIAGNOSIS — Z6835 Body mass index (BMI) 35.0-35.9, adult: Secondary | ICD-10-CM

## 2015-11-16 DIAGNOSIS — E039 Hypothyroidism, unspecified: Secondary | ICD-10-CM

## 2015-11-16 DIAGNOSIS — I10 Essential (primary) hypertension: Secondary | ICD-10-CM

## 2015-11-16 MED ORDER — BENAZEPRIL-HYDROCHLOROTHIAZIDE 20-12.5 MG PO TABS: ORAL_TABLET | ORAL | 1 refills | Status: DC

## 2015-11-16 MED ORDER — CARVEDILOL 6.25 MG PO TABS: ORAL_TABLET | ORAL | 1 refills | Status: DC

## 2015-11-16 MED ORDER — HYDRALAZINE HCL 25 MG PO TABS: ORAL_TABLET | ORAL | 1 refills | Status: DC

## 2015-11-16 NOTE — Patient Instructions (Addendum)
Please keep a diary of the stool urgency. Perhaps we can identify a trigger for the symptoms that you can either avoid or plan for.      IF you received an x-ray today, you will receive an invoice from [facility]. Please contact [facility] at [phone] with questions or concerns regarding your invoice.   IF you received labwork today, you will receive an invoice from [facility] [employer]. Please contact [facility] at [phone] with questions or concerns regarding your invoice.   Our billing staff will not be able to assist you with questions regarding bills from these companies.  You will be contacted with the lab results as soon as they are available. The fastest way to get your results is to activate your My Chart account. Instructions are located on the last page of this paperwork. If you have not heard from [place] regarding the results in 2 weeks, please contact this office.     Keeping You Healthy  Get These Tests  Blood Pressure- Have your blood pressure checked by your healthcare provider at least once a year.  Normal blood pressure is 120/80.  Weight- Have your body mass index (BMI) calculated to screen for obesity.  BMI is a measure of body fat based on height and weight.  You can calculate your own BMI at [employer].it  Cholesterol- Have your cholesterol checked every year.  Diabetes- Have your blood sugar checked every year if you have high blood pressure, high cholesterol, a family history of diabetes or if you are overweight.  Pap Test - Have a pap test every 1 to 5 years if you have been sexually active.  If you are older than 65 and recent pap tests have been normal you may not need additional pap tests.  In addition, if you have had a hysterectomy  for benign disease additional pap tests are not necessary.  Mammogram-Yearly mammograms are essential for early detection of breast cancer  Screening for Colon Cancer-  Colonoscopy starting at age 51. Screening may begin sooner depending on your family history and other health conditions.  Follow up colonoscopy as directed by your Gastroenterologist.  Screening for Osteoporosis- Screening begins at age 39 with bone density scanning, sooner if you are at higher risk for developing Osteoporosis.  Get these medicines  Calcium with Vitamin D- Your body requires 1200-1500 mg of Calcium a day and [phone] IU of Vitamin D a day.  You can only absorb 500 mg of Calcium at a time therefore Calcium must be taken in 2 or 3 separate doses throughout the day.  Hormones- Hormone therapy has been associated with increased risk for certain cancers and heart disease.  Talk to your healthcare provider about if you need relief from menopausal symptoms.  Aspirin- Ask your healthcare provider about taking Aspirin to prevent Heart Disease and Stroke.  Get these Immuniztions  Flu shot- Every fall  Pneumonia shot- Once after the age of 40; if you are younger ask your healthcare provider if you need a pneumonia shot.  Tetanus- Every ten years.  Zostavax- Once after the age of 7 to prevent shingles.  Take these steps  Don't smoke- Your healthcare provider can help you quit. For tips on how to quit, ask your healthcare provider or go to www.smokefree.gov or call 1-800 QUIT-NOW.  Be physically active- Exercise 5 days a week for a minimum of 30 minutes.  If you are not already physically active, start slow and gradually work up to 30 minutes  of moderate physical activity.  Try walking, dancing, bike riding, swimming, etc.  Eat a healthy diet- Eat a variety of healthy foods such as fruits, vegetables, whole grains, low fat milk, low fat cheeses, yogurt, lean meats, chicken, fish, eggs, dried beans, tofu, etc.  For more information go to www.thenutritionsource.org  Dental visit- Brush and floss teeth twice daily; visit your dentist twice a year.  Eye exam- Visit your Optometrist  or Ophthalmologist yearly.  Drink alcohol in moderation- Limit alcohol intake to one drink or less a day.  Never drink and drive.  Depression- Your emotional health is as important as your physical health.  If you're feeling down or losing interest in things you normally enjoy, please talk to your healthcare provider.  Seat Belts- can save your life; always wear one  Smoke/Carbon Monoxide detectors- These detectors need to be installed on the appropriate level of your home.  Replace batteries at least once a year.  Violence- If anyone is threatening or hurting you, please tell your healthcare provider. Living Will/ Health care power of attorney- Discuss with your healthcare provider and family.

## 2015-11-16 NOTE — Progress Notes (Signed)
Presents today for [employer] Visit-Subsequent.   Date of last exam: December 02, 2014 with Dr. Lorelei Pont  Interpreter used for this visit? no  Patient Care Team: [name], PA-C as PCP - General (Family Medicine) Eldred Manges, MD as Consulting Physician (Obstetrics and Gynecology)     Other items to address today: Iniitally none, but as we finished the visit, she mentioned that she isn't as steady descending stairs. Goes one foot at a time. Also, "I've never been good at making decisions." Reduced concentration, though notes that she is online a lot using social media, which may contribute. She was able to read a long article in the [employer] about President Trump. She hopes that returning to work next week will provide more structure and help, as it has before, but she isn't interested in creating structure for her life outside of work.  Cancer Screening: Cervical: n/a, >70, no history of abnormal pap Breast: yes, mammogram 02/2015 Colon: yes, colonoscopy 07/2014  Prostate: n/a   Other Screening: Last screening for diabetes: normal glucose and UA 11/13/2015 Last lipid screening: 11/2014 TC 185, TG 132, HDL 36, LDL 113   ADVANCE DIRECTIVES: Discussed: yes On File: no Materials Provided: yes   Immunization status: up to date and documented, missing doses of Zostavax, seasonal influenza.  Home Environment: Lives in a house. Her sister stays with her a lot. 1 story, but has to walk up steps to get in.    Patient Active Problem List   Diagnosis Date Noted  . Essential hypertension, benign 04/28/2012  . Osteoarthritis of left knee 04/28/2012  . Hypothyroidism 04/28/2012  . Hx of colonic polyps 12/15/2010  . Anxiety 11/17/2010  . Sleep apnea 11/17/2010     Past Medical History:  Diagnosis Date  . Abnormal perimenopausal bleeding 2005  . Allergy   . Anemia   . Anxiety   . Dysuria 2004  . Endometrial polyp 2006  . Environmental allergies   .  Fibroid 02/19/03  . H/O varicella   . History of measles, mumps, or rubella   . Hx of colonic polyps 12/15/2010  . Hypertension 07-19-2010   echo for pre-op EF 55%  normal left wall thickness LA mildly dialated with mitral and tricuspid regurgatation  . Hypothyroidism   . Interstitial cystitis   . Menopausal symptoms 2004  . Obesity   . Osteopenia 02/2012   -1.8 T score right femur neck  . PMB (postmenopausal bleeding) 06/21/10  . Sleep apnea   . Urinary incontinence 2011  . Vitamin D deficiency    history of     Past Surgical History:  Procedure Laterality Date  . COLONOSCOPY  12/15/10  . HYSTEROSCOPY  2006 and May 2012   for fibroids, endometrial polyps  . MOUTH SURGERY    . MYOMECTOMY  1974     Family History  Problem Relation Age of Onset  . Kidney disease Father     from uremia  . Benign prostatic hyperplasia Father   . Hypertension Mother   . Colon cancer Paternal Uncle   . Clotting disorder Maternal Grandmother     ????     Social History   Social History  . Marital status: Divorced    Spouse name: n/a  . Number of children: N/A  . Years of education: college   Occupational History  . Not on file.   Social History Main Topics  . Smoking status: Never Smoker  . Smokeless tobacco: Never Used  . Alcohol  use 0.6 oz/week    1 Glasses of wine per week     Comment: occasional (1-2 times per week)  . Drug use: No  . Sexual activity: No   Other Topics Concern  . Not on file   Social History Narrative   Exercise: Yoga 2 times a week.     Allergies  Allergen Reactions  . Bactrim [Sulfamethoxazole-Trimethoprim]   . Epinephrine     shakey  . Erythromycin   . Sulfa Antibiotics Rash     Prior to Admission medications   Medication Sig Start Date End Date Taking? Authorizing Provider  amoxicillin (AMOXIL) 875 MG tablet Take 875 mg by mouth 2 (two) times daily.   Yes Historical Provider, MD  naproxen sodium (ANAPROX) 550 MG tablet Take 550 mg by  mouth 2 (two) times daily with a meal.   Yes Historical Provider, MD  benazepril-hydrochlorthiazide (LOTENSIN HCT) 20-12.5 MG tablet Take one twice daily for blood pressure 05/01/15   Jenene Kauffmann, PA-C  carvedilol (COREG) 6.25 MG tablet TAKE 1 TABLET (6.25 MG TOTAL) BY MOUTH 2 (TWO) TIMES DAILY WITH A MEAL 05/01/15   Maryan Sivak, PA-C  hydrALAZINE (APRESOLINE) 25 MG tablet Take 2 in the morning, 1 at lunch, and 2 at dinner for blood pressure 05/01/15   Romaine Neville, PA-C  levothyroxine (SYNTHROID, LEVOTHROID) 112 MCG tablet TAKE 1 TABLET (112 MCG TOTAL) BY MOUTH DAILY BEFORE BREAKFAST. 11/12/15   [name], MD  liothyronine (CYTOMEL) 5 MCG tablet TAKE 1 TABLET BY MOUTH DAILY FOR THYROID 12/02/14   Darreld Mclean, MD      Fall Risk  11/16/2015 11/16/2015 12/02/2014 08/18/2013  Falls in the past year? No No Yes No  Number falls in past yr: - - 1 -     Functional Status Survey: Is the patient deaf or have difficulty hearing?: No Does the patient have difficulty seeing, even when wearing glasses/contacts?: No Does the patient have difficulty concentrating, remembering, or making decisions?: Yes Does the patient have difficulty walking or climbing stairs?: Yes Does the patient have difficulty dressing or bathing?: No Does the patient have difficulty doing errands alone such as visiting a doctor's office or shopping?: No    Depression screen [facility] 2/9 11/16/2015 12/02/2014 07/22/2014 08/18/2013  Decreased Interest 0 0 0 0  Down, Depressed, Hopeless 0 0 0 0  PHQ - 2 Score 0 0 0 0     ELECTROCARDIOGRAM: 07/2011 with Dr. Ellyn Hack at [facility]    PHYSICAL EXAM: BP (!) 142/80 (BP Location: Right Arm, Patient Position: Sitting, Cuff Size: Normal)   Pulse 60   Temp 98.4 F (36.9 C) (Oral)   Resp 16   Ht 5' (1.524 m)   Wt 181 lb (82.1 kg)   SpO2 96%   BMI 35.35 kg/m    Wt Readings from Last 3 Encounters:  11/16/15 181 lb (82.1 kg)  10/12/15 182 lb (82.6 kg)   05/01/15 181 lb (82.1 kg)          Physical Exam  Constitutional: She is oriented to person, place, and time. She appears well-developed and well-nourished. She is active and cooperative. No distress.  BP (!) 142/80 (BP Location: Right Arm, Patient Position: Sitting, Cuff Size: Normal)   Pulse 60   Temp 98.4 F (36.9 C) (Oral)   Resp 16   Ht 5' (1.524 m)   Wt 181 lb (82.1 kg)   SpO2 96%   BMI 35.35 kg/m    HENT:  Head:  Normocephalic and atraumatic.  Right Ear: Hearing, tympanic membrane, external ear and ear canal normal.  Left Ear: Hearing, tympanic membrane, external ear and ear canal normal.  Nose: Nose normal.  Mouth/Throat: Uvula is midline, oropharynx is clear and moist and mucous membranes are normal. No oral lesions. No uvula swelling. No oropharyngeal exudate.  Eyes: Conjunctivae, EOM and lids are normal. Pupils are equal, round, and reactive to light. Right eye exhibits no discharge. Left eye exhibits no discharge. No scleral icterus.  Fundoscopic exam:      The right eye shows no hemorrhage and no papilledema. The right eye shows red reflex.       The left eye shows no hemorrhage and no papilledema. The left eye shows red reflex.  Neck: Normal range of motion, full passive range of motion without pain and phonation normal. Neck supple. No thyromegaly present.  Cardiovascular: Normal rate, regular rhythm, normal heart sounds and intact distal pulses.  Exam reveals no gallop and no friction rub.   No murmur heard. Respiratory: Effort normal and breath sounds normal.  GI: Soft. Normal appearance and bowel sounds are normal. There is no hepatosplenomegaly. There is no tenderness.  Musculoskeletal:       Cervical back: Normal.       Thoracic back: Normal.       Lumbar back: Normal.  Lymphadenopathy:       Head (right side): No submandibular and no tonsillar adenopathy present.       Head (left side): No submandibular and no tonsillar adenopathy present.    She has  no cervical adenopathy.       Right: No supraclavicular adenopathy present.       Left: No supraclavicular adenopathy present.  Neurological: She is alert and oriented to person, place, and time. She has normal strength. No cranial nerve deficit or sensory deficit.  Skin: Skin is warm and dry. No rash noted. She is not diaphoretic.  Psychiatric: She has a normal mood and affect. Her speech is normal and behavior is normal. Judgment and thought content normal. Cognition and memory are normal.     Education/Counseling: yes diet and exercise yes prevention of chronic diseases yes smoking/tobacco cessation yes review "Covered Medicare Preventive Services"    ASSESSMENT/PLAN:  1. Medicare annual wellness visit, subsequent Age appropriate anticipatory guidance provided.  2. Essential hypertension, benign Just above goal. No adjustments today. She hasn't taken her afternoon medications today. Encourage her to take both LotensinHCT tablets together each morning to reduce nocturia. - benazepril-hydrochlorthiazide (LOTENSIN HCT) 20-12.5 MG tablet; Take one twice daily for blood pressure  Dispense: 180 tablet; Refill: 1 - carvedilol (COREG) 6.25 MG tablet; TAKE 1 TABLET (6.25 MG TOTAL) BY MOUTH 2 (TWO) TIMES DAILY WITH A MEAL  Dispense: 180 tablet; Refill: 1 - hydrALAZINE (APRESOLINE) 25 MG tablet; Take 2 in the morning, 1 at lunch, and 2 at dinner for blood pressure  Dispense: 450 tablet; Refill: 1  3. Hypothyroidism, unspecified hypothyroidism type Stable.  4. Sleep apnea Needs updated CPAP titration  - Ambulatory referral to Sleep Studies  5. Anxiety Not interested in changing her current treatment or making any behavioral changes or lifestyle changes that may help.  6. BMI 35.0-35.9,adult Encouraged healthy eating and increased exercise.  7. Chronic fatigue Possibly due to inadequate CPAP support. Re-evaluate if persists after repeat CPAP titration/adjustment if indicated. -  Ambulatory referral to Sleep Studies   Fara Chute, PA-C Physician Assistant-Certified [facility]

## 2015-11-18 ENCOUNTER — Encounter: Payer: Medicare Other | Admitting: Family Medicine

## 2015-11-23 ENCOUNTER — Encounter: Payer: Self-pay | Admitting: Physician Assistant

## 2015-11-24 ENCOUNTER — Encounter: Payer: Self-pay | Admitting: Neurology

## 2015-11-24 ENCOUNTER — Ambulatory Visit (INDEPENDENT_AMBULATORY_CARE_PROVIDER_SITE_OTHER): Payer: Medicare Other | Admitting: Neurology

## 2015-11-24 DIAGNOSIS — E669 Obesity, unspecified: Secondary | ICD-10-CM

## 2015-11-24 DIAGNOSIS — G4761 Periodic limb movement disorder: Secondary | ICD-10-CM

## 2015-11-24 DIAGNOSIS — F419 Anxiety disorder, unspecified: Secondary | ICD-10-CM | POA: Diagnosis not present

## 2015-11-24 DIAGNOSIS — G4733 Obstructive sleep apnea (adult) (pediatric): Secondary | ICD-10-CM

## 2015-11-24 DIAGNOSIS — R351 Nocturia: Secondary | ICD-10-CM | POA: Diagnosis not present

## 2015-11-24 DIAGNOSIS — G4710 Hypersomnia, unspecified: Secondary | ICD-10-CM

## 2015-11-24 DIAGNOSIS — Z9989 Dependence on other enabling machines and devices: Principal | ICD-10-CM

## 2015-11-24 NOTE — Progress Notes (Signed)
Subjective:    Patient ID: [name] is a 70 y.o. female.  HPI     [name], MD, PhD [facility] [address] [place], [place] [address]  Dear Domingo Mend,   I saw your patient, [name], upon your kind request in my neurologic clinic today for initial consultation of her sleep disorder, in particular, evaluation of her obstructive sleep apnea. The patient is unaccompanied today. As you know, Mr. Bixel is a very pleasant 70 year old right-handed woman with an underlying medical history of hypertension, osteoarthritis, hypothyroidism, anxiety, and obesity, who was previously diagnosed with obstructive sleep apnea years ago and placed on CPAP therapy. Prior sleep study results are not available for my review. She has been on CPAP therapy. She has an older CPAP machine, ResMed S8 Elite, and I reviewed her CPAP compliance data from 05/23/2015 through 11/18/2015 which is a total of 180 days during which time she used her machine every night with percent used days greater than 4 hours at 99%, indicating superb compliance, with an average usage of 7 hours and 36 minutes, residual AHI borderline at 5.2 per hour, leak acceptable with the 95th percentile at 16.8 L/m on a pressure of 11 cm.. She reports daytime somnolence and her Epworth sleepiness score is 14 out of 24 today, her fatigue score is 58 out of 63. She also reports difficulty maintaining sleep especially after she goes to the bathroom at night. I reviewed your office note from 11/16/2015. She reports significant anxiety especially at night and especially in the middle of the night when she cannot go back to sleep. A lot of her anxiety seems to surround her work situation. She says she has always been an anxious person. She is single. She has no children. She generally lives alone in her home but her sister has been staying with her a lot. She has no other siblings. Sister does not have  sleep apnea. Patient has been working through her anxiety with relaxation, meditation, drinking chamomile tea which often is a hit or miss. She would not want to consider medication for anxiety she states. She is a nonsmoker, drinks alcohol infrequently, drinks caffeine in the form of coffee, one cup in the morning and generally speaking noncaffeine tea one or 2 cups per day, not much water, maybe up to 3 cups per day. Her bedtime is around 10 PM and while she does not have difficulty falling asleep she will wake up in the early morning hours and usually goes to bathroom once or twice per night on average, sometimes more, and has difficulty going back to sleep. She does take a nap during the day when she is sedentary, typically no planned naps. She denies restless leg symptoms but has noted twitching in her sleep and leg movements but while it used to be significant enough to disheveled her sheets, does not seem to do that any longer. She denies morning headaches. Wake up time is around 6 AM. 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 When she cannot go back to sleep in the middle of the night, she starts using her computer or eye pad and goes on twitch her and starts worrying about politics and her job.  Her Past Medical History Is Significant For: Past Medical History:  Diagnosis Date  . Abnormal perimenopausal bleeding 2005  . Allergy   . Anemia   . Anxiety   . Dysuria 2004  .  Endometrial polyp 2006  . Environmental allergies   . Fibroid 02/19/03  . H/O varicella   . History of measles, mumps, or rubella   . Hx of colonic polyps 12/15/2010  . Hypertension 07-19-2010   echo for pre-op EF 55%  normal left wall thickness LA mildly dialated with mitral and tricuspid regurgatation  . Hypothyroidism   . Interstitial cystitis   . Menopausal symptoms 2004  . Obesity   . Osteopenia 02/2012   -1.8 T score right femur neck  . PMB (postmenopausal bleeding) 06/21/10  .  Sleep apnea   . Urinary incontinence 2011  . Vitamin D deficiency    history of    Her Past Surgical History Is Significant For: Past Surgical History:  Procedure Laterality Date  . COLONOSCOPY  12/15/10  . HYSTEROSCOPY  2006 and May 2012   for fibroids, endometrial polyps  . MOUTH SURGERY    . MYOMECTOMY  1974    Her Family History Is Significant For: Family History  Problem Relation Age of Onset  . Kidney disease Father     from uremia  . Benign prostatic hyperplasia Father   . Hypertension Mother   . Colon cancer Paternal Uncle   . Clotting disorder Maternal Grandmother     ????    Her Social History Is Significant For: Social History   Social History  . Marital status: Divorced    Spouse name: n/a  . Number of children: N/A  . Years of education: college   Social History Main Topics  . Smoking status: Never Smoker  . Smokeless tobacco: Never Used  . Alcohol use 0.6 oz/week    1 Glasses of wine per week     Comment: occasional (1-2 times per week)  . Drug use: No  . Sexual activity: No   Other Topics Concern  . None   Social History Narrative   Exercise: Yoga 2 times a week.    Her Allergies Are:  Allergies  Allergen Reactions  . Bactrim [Sulfamethoxazole-Trimethoprim]   . Epinephrine     shakey  . Erythromycin   . Sulfa Antibiotics Rash  :   Her Current Medications Are:  Outpatient Encounter Prescriptions as of 11/24/2015  Medication Sig  . benazepril-hydrochlorthiazide (LOTENSIN HCT) 20-12.5 MG tablet Take one twice daily for blood pressure  . carvedilol (COREG) 6.25 MG tablet TAKE 1 TABLET (6.25 MG TOTAL) BY MOUTH 2 (TWO) TIMES DAILY WITH A MEAL  . hydrALAZINE (APRESOLINE) 25 MG tablet Take 2 in the morning, 1 at lunch, and 2 at dinner for blood pressure  . levothyroxine (SYNTHROID, LEVOTHROID) 112 MCG tablet TAKE 1 TABLET (112 MCG TOTAL) BY MOUTH DAILY BEFORE BREAKFAST.  Marland Kitchen liothyronine (CYTOMEL) 5 MCG tablet TAKE 1 TABLET BY MOUTH DAILY FOR  THYROID  . [DISCONTINUED] amoxicillin (AMOXIL) 875 MG tablet Take 875 mg by mouth 2 (two) times daily.  . [DISCONTINUED] naproxen sodium (ANAPROX) 550 MG tablet Take 550 mg by mouth 2 (two) times daily with a meal.   No facility-administered encounter medications on file as of 11/24/2015.   :  Review of Systems:  Out of a complete 14 point review of systems, all are reviewed and negative with the exception of these symptoms as listed below: Review of Systems  Neurological:       Pt complains of sleepiness and restless legs at night. She has trouble going back to sleep after rising to use the bathroom during the night. Pt is on cpap but does not have  a sleep physician. She uses [employer] as her DME. Pt brought a recent cpap download.  Epworth Sleepiness Scale 0= would never doze 1= slight chance of dozing 2= moderate chance of dozing 3= high chance of dozing  Sitting and reading:3 Watching TV:3 Sitting inactive in a public place (ex. Theater or meeting):3 As a passenger in a car for an hour without a break:2 Lying down to rest in the afternoon:2 Sitting and talking to someone:0 Sitting quietly after lunch (no alcohol):0 In a car, while stopped in traffic:1 Total:14   Psychiatric/Behavioral: Positive for sleep disturbance.    Objective:  Neurologic Exam  Physical Exam Physical Examination:   Vitals:   11/24/15 0821  BP: (P) 140/82  Pulse: (P) 64  Resp: (P) 20    General Examination: The patient is a very pleasant 70 y.o. female in no acute distress. She appears well-developed and well-nourished and well groomed. She is mildly anxious appearing. Speech is slightly pressured.  HEENT: Normocephalic, atraumatic, pupils are equal, round and reactive to light and accommodation. Funduscopic exam is normal with sharp disc margins noted. Extraocular tracking is good without limitation to gaze excursion or nystagmus noted. Normal smooth pursuit is noted. Hearing is grossly intact.  Face is symmetric with normal facial animation and normal facial sensation. Speech is clear with no dysarthria noted. There is no hypophonia. There is no lip, neck/head, jaw or voice tremor. Neck is supple with full range of passive and active motion. There are no carotid bruits on auscultation. Oropharynx exam reveals: mild mouth dryness, adequate dental hygiene and moderate airway crowding, due to smaller airway, uvula larger and tonsils in place, which were not fully visualized. Mallampati is class III. Tongue protrudes centrally and palate elevates symmetrically. Tonsils are  Neck size is 14.75 inches. She has a Mild overbite. Nasal inspection reveals no significant nasal mucosal bogginess or redness and no septal deviation.   Chest: Clear to auscultation without wheezing, rhonchi or crackles noted.  Heart: S1+S2+0, regular and normal without murmurs, rubs or gallops noted.   Abdomen: Soft, non-tender and non-distended with normal bowel sounds appreciated on auscultation.  Extremities: There is no pitting edema in the distal lower extremities bilaterally. Pedal pulses are intact.  Skin: Warm and dry without trophic changes noted. There are no varicose veins.  Musculoskeletal: exam reveals no obvious joint deformities, tenderness or joint swelling or erythema.   Neurologically:  Mental status: The patient is awake, alert and oriented in all 4 spheres. Her immediate and remote memory, attention, language skills and fund of knowledge are appropriate. There is no evidence of aphasia, agnosia, apraxia or anomia. Speech is clear with normal prosody and enunciation. Thought process is linear. Mood is normal and affect is normal.  Cranial nerves II - XII are as described above under HEENT exam. In addition: shoulder shrug is normal with equal shoulder height noted. Motor exam: Normal bulk, strength and tone is noted. There is no drift, tremor or rebound. Romberg is negative. Reflexes are 2+ throughout.  Babinski: Toes are flexor bilaterally. Fine motor skills and coordination: intact with normal finger taps, normal hand movements, normal rapid alternating patting, normal foot taps and normal foot agility.  Cerebellar testing: No dysmetria or intention tremor on finger to nose testing. Heel to shin is unremarkable bilaterally. There is no truncal or gait ataxia.  Sensory exam: intact to light touch, pinprick, vibration, temperature sense in the upper and lower extremities.  Gait, station and balance: She stands easily. No veering to one  side is noted. No leaning to one side is noted. Posture is age-appropriate and stance is narrow based. Gait shows normal stride length and normal pace. No problems turning are noted. Tandem walk is unremarkable. Intact toe and heel stance is noted.               Assessment and Plan:  In summary, SHAYRA LANGSTAFF is a very pleasant 70 y.o.-year old female with an underlying medical history of hypertension, osteoarthritis, hypothyroidism, anxiety, and obesity, who carries a prior diagnosis of obstructive sleep apnea of unknown severity and has been on CPAP therapy. She is fully compliant with treatment but complains of recurrence of residual daytime somnolence and nocturia, difficulty maintaining sleep, PLMs. In addition, she has an older CPAP machine and would benefit from reevaluation, adjustment of treatment setting, mask refit, and a new CPAP machine. She appears anxious and reports a longstanding history of anxiety. This may be a separate problem to address at some point and she is encouraged to bring this up with you again.  I had a long chat with the patient about my findings and the diagnosis of OSA, its prognosis and treatment options. We talked about medical treatments, surgical interventions and non-pharmacological approaches. I explained in particular the risks and ramifications of untreated moderate to severe OSA, especially with respect to developing  cardiovascular disease down the Road, including congestive heart failure, difficult to treat hypertension, cardiac arrhythmias, or stroke. Even type 2 diabetes has, in part, been linked to untreated OSA. Symptoms of untreated OSA include daytime sleepiness, memory problems, mood irritability and mood disorder such as depression and anxiety, lack of energy, as well as recurrent headaches, especially morning headaches. We talked about trying to maintain a healthy lifestyle in general, as well as the importance of weight control. I encouraged the patient to eat healthy, exercise daily and keep well hydrated, to keep a scheduled bedtime and wake time routine, to not skip any meals and eat healthy snacks in between meals. I advised the patient not to drive when feeling sleepy.in addition, we talked about maintaining good sleep hygiene. I talked to her about natural changes in sleep schedule as we get older. In addition, I think it would help if she were to keep a sleep diary, I provided her with a two-week sleep diary today so I can review next time. I would recommend sleep study testing. She is reluctant especially as she is getting ready to go back to work. We can certainly work around her schedule. She is fully compliant with treatment and there is no urgency for sleep study testing but I think it would help. I will see her back after the test is completed. We will also be on the lookout for PLMD.   Thank you very much for allowing me to participate in the care of this nice patient. If I can be of any further assistance to you please do not hesitate to call me at [phone].  Sincerely,   [name], MD, PhD

## 2015-11-24 NOTE — Patient Instructions (Addendum)
Based on your history, your symptoms and your exam I believe you still have obstructive sleep apnea or OSA, and I think we should proceed with a sleep study to determine how severe it is and to get you a new CPAP machine. Please remember, the risks and ramifications of moderate to severe obstructive sleep apnea or OSA are: Cardiovascular disease, including congestive heart failure, stroke, difficult to control hypertension, arrhythmias, and even type 2 diabetes has been linked to untreated OSA. Sleep apnea causes disruption of sleep and sleep deprivation in most cases, which, in turn, can cause recurrent headaches, problems with memory, mood, concentration, focus, and vigilance. Most people with untreated sleep apnea report excessive daytime sleepiness, which can affect their ability to drive. Please do not drive if you feel sleepy.   I will likely see you back after your sleep study to go over the test results and where to go from there. We will call you after your sleep study to advise about the results (most likely, you will hear from Beverlee Nims, my nurse) and to set up an appointment at the time, as necessary.    Our sleep lab administrative assistant, Arrie Aran will meet with you or call you to schedule your sleep study. If you don't hear back from her by next week please feel free to call her at [phone]. This is her direct line and please leave a message with your phone number to call back if you get the voicemail box. She will call back as soon as possible.   Please remember to try to maintain good sleep hygiene, which means: Keep a regular sleep and wake schedule, try not to exercise or have a meal within 2 hours of your bedtime, try to keep your bedroom conducive for sleep, that is, cool and dark, without light distractors such as an illuminated alarm clock, and refrain from watching TV right before sleep or in the middle of the night and do not keep the TV or radio on during the night. Also, try not to  use or play on electronic devices at bedtime, such as your cell phone, tablet PC or laptop. If you like to read at bedtime on an electronic device, try to dim the background light as much as possible. Do not eat in the middle of the night.   PLEASE FILL OUT A 2 WEEK SLEEP DIARY AND BRING BEFORE THE NEXT APPOINTMENT, SO I CAN REVIEW AT THE TIME OF THE APPOINTMENT

## 2016-01-11 ENCOUNTER — Ambulatory Visit (INDEPENDENT_AMBULATORY_CARE_PROVIDER_SITE_OTHER): Payer: Medicare Other | Admitting: Neurology

## 2016-01-11 DIAGNOSIS — G4733 Obstructive sleep apnea (adult) (pediatric): Secondary | ICD-10-CM | POA: Diagnosis not present

## 2016-01-11 DIAGNOSIS — G479 Sleep disorder, unspecified: Secondary | ICD-10-CM

## 2016-01-11 DIAGNOSIS — G4761 Periodic limb movement disorder: Secondary | ICD-10-CM

## 2016-01-11 DIAGNOSIS — G4720 Circadian rhythm sleep disorder, unspecified type: Secondary | ICD-10-CM

## 2016-01-25 ENCOUNTER — Other Ambulatory Visit: Payer: Self-pay | Admitting: Family Medicine

## 2016-01-25 DIAGNOSIS — E039 Hypothyroidism, unspecified: Secondary | ICD-10-CM

## 2016-01-26 NOTE — Addendum Note (Signed)
Addended by: [name] on: 01/26/2016 06:26 PM   Modules accepted: Orders

## 2016-01-26 NOTE — Progress Notes (Signed)
Patient referred by [name], seen by me on 11/24/15, diagnostic PSG on 01/11/16.    Please call and notify the patient that the recent sleep study did confirm the diagnosis of Mild obstructive sleep apnea, severe during REM sleep and Moderate PLMs. I recommend treatment in the form of CPAP. This will require a repeat sleep study for proper titration and mask fitting. Please explain to patient and arrange for a CPAP titration study. I have placed an order in the chart. Thanks, and please route to [facility] for scheduling next sleep study.  [name], MD, PhD [facility])

## 2016-01-26 NOTE — Procedures (Signed)
PATIENT'S NAME:  [name], [name] DOB:      [birth year]      MR#:    [id number]     DATE OF RECORDING: 01/11/2016 REFERRING M.D.:  [name], PA-C Study Performed:   Baseline Polysomnogram HISTORY: 70 year old woman with a history of hypertension, osteoarthritis, hypothyroidism, anxiety, and obesity, who was previously diagnosed with obstructive sleep apnea years ago and placed on CPAP therapy. The patient endorsed the Epworth Sleepiness Scale at 14/24 points. The patient's weight 183 pounds with a height of 62 (inches), resulting in a BMI of 34.1 kg/m2. The patient's neck circumference measured 14.8 inches.  CURRENT MEDICATIONS: Lotensin, Coreg, Apresoline, Synthroid, Cytomel.   PROCEDURE:  This is a multichannel digital polysomnogram utilizing the Somnostar 11.2 system.  Electrodes and sensors were applied and monitored per AASM Specifications.   EEG, EOG, Chin and Limb EMG, were sampled at 200 Hz.  ECG, Snore and Nasal Pressure, Thermal Airflow, Respiratory Effort, CPAP Flow and Pressure, Oximetry was sampled at 50 Hz. Digital video and audio were recorded.      BASELINE STUDY  Lights Out was at 20:52 and Lights On at 05:10.  Total recording time (TRT) was 498.5 minutes, with a total sleep time (TST) of  213.5 minutes.   The patient's sleep latency was 146 minutes, which is delayed.  REM latency was 189.5 minutes, which is increased.  The sleep efficiency was 42.8 %, which is markedly reduced.     SLEEP ARCHITECTURE: WASO (Wake after sleep onset) was 163.5 minutes with moderate sleep fragmentation noted.  There were 28.5 minutes in Stage N1, 113.5 minutes Stage N2, 55.5 minutes Stage N3 and 16 minutes in Stage REM.  The percentage of Stage N1 was 13.3%, which is increased, Stage N2 was 53.2%, Stage N3 was 26.%, which is increased, and Stage R (REM sleep) was 7.5%, which is reduced.  Audio and video analysis did not show any abnormal or unusual movements, behaviors, phonations or  vocalizations. Per technologist, she appeared to be quite anxious.  The patient took 4 bathroom breaks. Mild snoring was noted.  The EKG was in keeping with normal sinus rhythm (NSR).  RESPIRATORY ANALYSIS:  There were a total of 44 respiratory events:  22 obstructive apneas, 0 central apneas and 0 mixed apneas with a total of 22 apneas and an apnea index (AI) of 6.2 /hour. There were 22 hypopneas with a hypopnea index of 6.2 /hour. The patient also had 0 respiratory event related arousals (RERAs).      The total APNEA/HYPOPNEA INDEX (AHI) was 12.4/hour and the total RESPIRATORY DISTURBANCE INDEX was 12.4 /hour.  11 events occurred in REM sleep and 24 events in NREM. The REM AHI was 41.3 /hour, versus a non-REM AHI of 10.. The patient spent 83.5 minutes of total sleep time in the supine position and 130 minutes in non-supine.. The supine AHI was 22.3 versus a non-supine AHI of 6.0.  OXYGEN SATURATION & C02:  The Wake baseline 02 saturation was 98%, with the lowest being 84%. Time spent below 89% saturation equaled 6 minutes.  PERIODIC LIMB MOVEMENTS:   The patient had a total of 105 Periodic Limb Movements.  The Periodic Limb Movement (PLM) index was 29.5 and the PLM Arousal index was 3.4/hour. Post-study, the patient indicated that sleep was worse than usual.   IMPRESSION: 1. Obstructive Sleep Apnea (OSA) 2. Periodic Limb Movement Disorder (PLMD) 3. Dysfunctions associated with sleep stages or arousal from sleep 4. Repetitive Intrusions of Sleep  RECOMMENDATIONS: 1.  This overnight polysomnogram demonstrates overall mild obstructive sleep apnea, severe during REM sleep. Please note, the decrease in sleep efficiency and poor sleep consolidation, and little amount of REM sleep during this study may underestimate her AHI and O2 nadir. Treatment options may include: Positive airway pressure and ideally is achieved during a full night CPAP titration study for proper treatment settings and mask  fitting, avoidance of the supine sleep position, weight loss, an oral appliance (aka dental device, custom made by a specialized dentist usually), or upper airway or jaw surgery (not usually first line treatments). Given her medical history and prior ability to tolerate CPAP, a full night CPAP titration is recommended.  2. Please note that untreated obstructive sleep apnea carries additional perioperative morbidity. Patients with significant obstructive sleep apnea should receive perioperative PAP therapy and the surgeons and particularly the anesthesiologist should be informed of the diagnosis and the severity of the sleep disordered breathing. 3. Moderate PLMs (periodic limb movements of sleep) were noted during the study only with mild  arousals; clinical correlation is recommended.  4. This study shows sleep fragmentation and abnormal sleep stage percentages; these are nonspecific findings and per se do not signify an intrinsic sleep disorder or a cause for the patient's sleep-related symptoms. Causes include (but are not limited to) the first night effect of the sleep study, circadian rhythm disturbances, medication effect or an underlying mood disorder or medical problem.  5. The patient will be seen in follow-up by Dr. Rexene Alberts at [facility] for discussion of the test results and further management strategies. The referring provider will be notified of the test results.  I certify that I have reviewed the entire raw data recording prior to the issuance of this report in accordance with the Standards of Accreditation of the American Academy of Sleep Medicine (AASM)     [name], MD, PhD Diplomat, American Board of Psychiatry and Neurology  Diplomat, [place] of [employer]

## 2016-01-31 ENCOUNTER — Telehealth: Payer: Self-pay

## 2016-01-31 NOTE — Telephone Encounter (Signed)
-----   Message from [name], MD sent at 01/26/2016  6:26 PM EST ----- Patient referred by [name], seen by me on 11/24/15, diagnostic PSG on 01/11/16.    Please call and notify the patient that the recent sleep study did confirm the diagnosis of Mild obstructive sleep apnea, severe during REM sleep and Moderate PLMs. I recommend treatment in the form of CPAP. This will require a repeat sleep study for proper titration and mask fitting. Please explain to patient and arrange for a CPAP titration study. I have placed an order in the chart. Thanks, and please route to [facility] for scheduling next sleep study.  [name], MD, PhD [facility])

## 2016-01-31 NOTE — Telephone Encounter (Signed)
I spoke to patient and she is aware of results and recommendations. She would like to proceed with titration study.  I will send copy of report to PCP.

## 2016-02-01 ENCOUNTER — Other Ambulatory Visit: Payer: Self-pay

## 2016-02-01 ENCOUNTER — Telehealth: Payer: Self-pay | Admitting: Internal Medicine

## 2016-02-01 MED ORDER — LIOTHYRONINE SODIUM 5 MCG PO TABS: ORAL_TABLET | ORAL | 3 refills | Status: DC

## 2016-02-01 NOTE — Telephone Encounter (Signed)
Ok to refill 

## 2016-02-01 NOTE — Telephone Encounter (Signed)
OK to refill? Last refill 12/02/14, labs drawn on sept.

## 2016-02-01 NOTE — Telephone Encounter (Signed)
Pt needs refills called to [place] for the cytomil please

## 2016-02-01 NOTE — Telephone Encounter (Signed)
SENT 

## 2016-02-08 ENCOUNTER — Telehealth: Payer: Self-pay

## 2016-02-08 NOTE — Telephone Encounter (Signed)
Patient had a Medicare CPE done, the labs were billed to patient.  She states it was part of her CPE and should not be billed for them.   [phone]

## 2016-02-10 NOTE — Telephone Encounter (Signed)
No labs were ordered at the [facility] Annual Wellness Visit on 11/16/2015.  Labs performed on 11/13/2015 were associated with the diagnoses as appropriate, to my knowledge, as they would have been if performed on the day of the CPE.  Happy to receive clarification/advice from someone in billing on this.

## 2016-02-10 NOTE — Telephone Encounter (Signed)
Pt. Spoke to [facility] and billing here and they told her it was the way the md coded the visit that separated the lab from the visit. Why it was not covered. Can we help?

## 2016-02-17 ENCOUNTER — Ambulatory Visit (INDEPENDENT_AMBULATORY_CARE_PROVIDER_SITE_OTHER): Payer: Medicare Other | Admitting: Neurology

## 2016-02-17 DIAGNOSIS — G4733 Obstructive sleep apnea (adult) (pediatric): Secondary | ICD-10-CM | POA: Diagnosis not present

## 2016-02-17 DIAGNOSIS — G4720 Circadian rhythm sleep disorder, unspecified type: Secondary | ICD-10-CM

## 2016-02-19 ENCOUNTER — Inpatient Hospital Stay: Admission: RE | Admit: 2016-02-19 | Payer: Self-pay | Source: Ambulatory Visit

## 2016-02-19 ENCOUNTER — Ambulatory Visit (INDEPENDENT_AMBULATORY_CARE_PROVIDER_SITE_OTHER): Payer: Medicare Other

## 2016-02-19 ENCOUNTER — Ambulatory Visit (INDEPENDENT_AMBULATORY_CARE_PROVIDER_SITE_OTHER): Payer: Medicare Other | Admitting: Family Medicine

## 2016-02-19 VITALS — BP 122/70 | HR 74 | Temp 99.7°F | Resp 18 | Ht 60.0 in | Wt 182.0 lb

## 2016-02-19 DIAGNOSIS — R630 Anorexia: Secondary | ICD-10-CM | POA: Diagnosis not present

## 2016-02-19 DIAGNOSIS — J069 Acute upper respiratory infection, unspecified: Secondary | ICD-10-CM

## 2016-02-19 DIAGNOSIS — R05 Cough: Secondary | ICD-10-CM

## 2016-02-19 DIAGNOSIS — R5383 Other fatigue: Secondary | ICD-10-CM | POA: Diagnosis not present

## 2016-02-19 DIAGNOSIS — R059 Cough, unspecified: Secondary | ICD-10-CM

## 2016-02-19 MED ORDER — BENZONATATE 100 MG PO CAPS: 100.0000 mg | ORAL_CAPSULE | Freq: Two times a day (BID) | ORAL | 1 refills | Status: DC | PRN

## 2016-02-19 NOTE — Progress Notes (Signed)
Chief Complaint  Patient presents with  . Cough    not eating much/ patient hit head on the car door this morning  . Fatigue  . CHEST TIGHTNESS    HPI   Pt reports that she has been having coughing and could not eat so she has been feeling weak She reports that she coughs and her chest "seizes up" She states that walks on her own  She denies dizziness She reports that she gets very little appetite She has not been drinking much fluid yesterday but today she hs increased her fluids She denies a history of asthma, tobacco use She reports that she can get the chills.   Past Medical History:  Diagnosis Date  . Abnormal perimenopausal bleeding 2005  . Allergy   . Anemia   . Anxiety   . Dysuria 2004  . Endometrial polyp 2006  . Environmental allergies   . Fibroid 02/19/03  . H/O varicella   . History of measles, mumps, or rubella   . Hx of colonic polyps 12/15/2010  . Hypertension 07-19-2010   echo for pre-op EF 55%  normal left wall thickness LA mildly dialated with mitral and tricuspid regurgatation  . Hypothyroidism   . Interstitial cystitis   . Menopausal symptoms 2004  . Obesity   . Osteopenia 02/2012   -1.8 T score right femur neck  . PMB (postmenopausal bleeding) 06/21/10  . Sleep apnea   . Urinary incontinence 2011  . Vitamin D deficiency    history of    Current Outpatient Prescriptions  Medication Sig Dispense Refill  . benazepril-hydrochlorthiazide (LOTENSIN HCT) 20-12.5 MG tablet Take one twice daily for blood pressure 180 tablet 1  . carvedilol (COREG) 6.25 MG tablet TAKE 1 TABLET (6.25 MG TOTAL) BY MOUTH 2 (TWO) TIMES DAILY WITH A MEAL 180 tablet 1  . hydrALAZINE (APRESOLINE) 25 MG tablet Take 2 in the morning, 1 at lunch, and 2 at dinner for blood pressure 450 tablet 1  . levothyroxine (SYNTHROID, LEVOTHROID) 112 MCG tablet TAKE 1 TABLET (112 MCG TOTAL) BY MOUTH DAILY BEFORE BREAKFAST. 90 tablet 3  . liothyronine (CYTOMEL) 5 MCG tablet TAKE 1 TABLET BY  MOUTH DAILY FOR THYROID 90 tablet 3   No current facility-administered medications for this visit.     Allergies:  Allergies  Allergen Reactions  . Bactrim [Sulfamethoxazole-Trimethoprim]   . Epinephrine     shakey  . Erythromycin   . Sulfa Antibiotics Rash    Past Surgical History:  Procedure Laterality Date  . COLONOSCOPY  12/15/10  . HYSTEROSCOPY  2006 and May 2012   for fibroids, endometrial polyps  . MOUTH SURGERY    . MYOMECTOMY  1974    Social History   Social History  . Marital status: Divorced    Spouse name: n/a  . Number of children: N/A  . Years of education: college   Social History Main Topics  . Smoking status: Never Smoker  . Smokeless tobacco: Never Used  . Alcohol use 0.6 oz/week    1 Glasses of wine per week     Comment: occasional (1-2 times per week)  . Drug use: No  . Sexual activity: No   Other Topics Concern  . None   Social History Narrative   Exercise: Yoga 2 times a week.    Review of Systems  Constitutional: Negative for chills and fever.  Eyes: Negative for blurred vision, double vision and photophobia.  Respiratory: Positive for cough. Negative for shortness of breath  and wheezing.   Cardiovascular: Negative for chest pain and palpitations.  Gastrointestinal: Negative for abdominal pain, nausea and vomiting.  Genitourinary: Negative for dysuria and urgency.  Skin: Negative for itching and rash.    Objective: Vitals:   02/19/16 1003  BP: 122/70  Pulse: 74  Resp: 18  Temp: 99.7 F (37.6 C)  TempSrc: Oral  SpO2: 96%  Weight: 182 lb (82.6 kg)  Height: 5' (1.524 m)    Physical Exam General: alert, oriented, in NAD Head: normocephalic, atraumatic, no sinus tenderness Eyes: EOM intact, no scleral icterus or conjunctival injection Ears: TM clear bilaterally Throat: no pharyngeal exudate or erythema Lymph: no posterior auricular, submental or cervical lymph adenopathy Heart: normal rate, normal sinus rhythm, no  murmurs Lungs: clear to auscultation bilaterally, no wheezing  CXR- no pneumonia  Assessment and Plan [name] was seen today for cough, fatigue and chest tightness.  Diagnoses and all orders for this visit:  Cough- chest xray negative so no antibiotic Will send tessalon for cough Avoid sudafed and otc cough suppressants as they increase bp and affect thyroid -     DG Chest 2 View -     DG Chest 2 View  Poor appetite - increase fluid hydration and eat small meals  Other fatigue- increase rest as needed, and increase hydration and intake of food -     DG Chest 2 View  Other orders -     benzonatate (TESSALON) 100 MG capsule; Take 1 capsule (100 mg total) by mouth 2 (two) times daily as needed for cough.       [place]

## 2016-02-19 NOTE — Patient Instructions (Signed)
     IF you received an x-ray today, you will receive an invoice from [facility]. Please contact [facility] at [phone] with questions or concerns regarding your invoice.   IF you received labwork today, you will receive an invoice from [facility]. Please contact [facility] at [phone] with questions or concerns regarding your invoice.   Our billing staff will not be able to assist you with questions regarding bills from these companies.  You will be contacted with the lab results as soon as they are available. The fastest way to get your results is to activate your My Chart account. Instructions are located on the last page of this paperwork. If you have not heard from us regarding the results in 2 weeks, please contact this office.     

## 2016-02-21 ENCOUNTER — Telehealth: Payer: Self-pay | Admitting: Family Medicine

## 2016-02-21 NOTE — Telephone Encounter (Signed)
Pt calling to let you know that EMS came to her house yeserday cause she passed out in shower they told her that her sugar and vitals was all normal they told her just shower in cooler water she states that she wants to eat but was told not to eat dairy products and she want to eat please call patient she feels a little better

## 2016-02-21 NOTE — Telephone Encounter (Signed)
Pt took a hot shower and felt like she was going to pass out. EMS came and she was evaluated in the field. She states that she had normal vitals.  Her question was should she have hot showers. Advised her to wait several days and just do warm showers for now.

## 2016-02-23 ENCOUNTER — Telehealth: Payer: Self-pay

## 2016-02-23 NOTE — Telephone Encounter (Signed)
I called pt to discuss sleep study results. Home number rang busy. Left a message on pt's cell phone to call me back.

## 2016-02-23 NOTE — Addendum Note (Signed)
Addended by: [name] on: 02/23/2016 08:46 AM   Modules accepted: Orders

## 2016-02-23 NOTE — Telephone Encounter (Signed)
-----   Message from [name], MD sent at 02/23/2016  8:46 AM EST ----- Patient referred by [name], seen by me on 11/24/15, diagnostic PSG on 01/11/16.    Please call and inform patient that I have entered an order for treatment with positive airway pressure (PAP) treatment of obstructive sleep apnea (OSA). She did fairly during the latest sleep study with very little sleep achieved and no REM sleep. Nevertheless, I will arrange for a CPAP machine for home use through a DME (durable medical equipment) company of Her choice; and I will see the patient back in follow-up in about 8-10 weeks. Please also explain to the patient that I will be looking out for compliance data, which can be downloaded from the machine (stored on an SD card, that is inserted in the machine) or via remote access through a modem, that is built into the machine. At the time of the followup appointment we will discuss sleep study results and how it is going with PAP treatment at home. Please advise patient to bring Her machine at the time of the first FU visit, even though this is cumbersome. Bringing the machine for every visit after that will likely not be needed, but often helps for the first visit to troubleshoot if needed. Please re-enforce the importance of compliance with treatment and the need for [place] to monitor compliance data - often an insurance requirement and actually good feedback for the patient as far as how they are doing.  Also remind patient, that any interim PAP machine or mask issues should be first addressed with the DME company, as they can often help better with technical and mask fit issues. Please ask if patient has a preference regarding DME company.  Please also make sure, the patient has a follow-up appointment with me in about 8-10 weeks from the setup date, thanks.  Once you have spoken to the patient - and faxed/routed report to PCP and referring MD (if other than PCP), you can close this encounter,  thanks,   [name], MD, PhD [facility])

## 2016-02-23 NOTE — Procedures (Signed)
PATIENT'S NAME:  [name], [name] DOB:      1945-11-13      MR#:    [id number]     DATE OF RECORDING: 02/17/2016 REFERRING M.D.:  [name], PA-C Study Performed:   CPAP  Titration HISTORY:  70 year old woman with a history of hypertension, osteoarthritis, hypothyroidism, anxiety, and obesity, who presents for CPAP titration. Her NPSG performed on 01/11/2016 showed an AHI of 12.4/hour with SPO2 nadir of 84%. The patient endorsed the Epworth Sleepiness Scale at 14 points. The patient's weight 183 pounds with a height of 61 (inches), resulting in a BMI of 34.5 kg/m2. The patient's neck circumference measured 14.8 inches.  CURRENT MEDICATIONS: Lotensin, Coreg, Apresoline, Synthroid, Cytomel.  PROCEDURE:  This is a multichannel digital polysomnogram utilizing the SomnoStar 11.2 system.  Electrodes and sensors were applied and monitored per AASM Specifications.   EEG, EOG, Chin and Limb EMG, were sampled at 200 Hz.  ECG, Snore and Nasal Pressure, Thermal Airflow, Respiratory Effort, CPAP Flow and Pressure, Oximetry was sampled at 50 Hz. Digital video and audio were recorded.      Patient was tried on different masks, but did not feel comfortable fully with any one mask. Uses a Swift FX interface at home and was fitted with a Nuance interface, size small. CPAP was initiated at 5 cmH20 with heated humidity per AASM split night standards and pressure was advanced to 6 cmH20 because of hypopneas, apneas and desaturations.  At a PAP pressure of 6 cmH20, there was a reduction of the AHI to 0 with supine NREM sleep achieved, and O2 nadir of 91%.    Lights Out was at 21:48 and Lights On at 04:12. Total recording time (TRT) was 384 minutes, with a total sleep time (TST) of 84.5 minutes. The patient's sleep latency was 128.5 minutes, which is markedly prolonged.  REM sleep was absent. The sleep efficiency was 22%, which is very reduced.     SLEEP ARCHITECTURE: WASO (Wake after sleep onset)  was 155 minutes,  which is high.  There were 13.5 minutes in Stage N1, 10.5 minutes Stage N2, 60.5 minutes Stage N3 and 0 minutes in Stage REM.  The percentage of Stage N1 was 16.%, which is increased, Stage N2 was 12.4%, which is reduced, Stage N3 was 71.6%, which is increased, and Stage R (REM sleep) was absent.   The arousals were noted as: 33 were spontaneous, 0 were associated with PLMs, 1 were associated with respiratory events.  Audio and video analysis did not show any abnormal or unusual movements, behaviors, phonations or vocalizations, with the exception that patient was struggling with the mask and had multiple bouts of cough, multiple bathroom breaks. The patient took 6 bathroom breaks.  The EKG was in keeping with normal sinus rhythm (NSR).  RESPIRATORY ANALYSIS:  There was a total of 8 respiratory events: 0 obstructive apneas, 0 central apneas and 0 mixed apneas with a total of 0 apneas and an apnea index (AI) of 0 /hour. There were 8 hypopneas with a hypopnea index of 5.7/hour. The patient also had 0 respiratory event related arousals (RERAs).      The total APNEA/HYPOPNEA INDEX  (AHI) was 5.7 /hour and the total RESPIRATORY DISTURBANCE INDEX was 5.7 .hour  0 events occurred in REM sleep and 8 events in NREM. The REM AHI was 0 /hour versus a non-REM AHI of 5.7 /hour.  The patient spent 84.5 minutes of total sleep time in the supine position and 0 minutes in non-supine.  The supine AHI was 5.7, versus a non-supine AHI of 0.0.  OXYGEN SATURATION & C02:  The baseline 02 saturation was 97%, with the lowest being 91%. Time spent below 89% saturation equaled 0 minutes.  PERIODIC LIMB MOVEMENTS:    The patient had a total of 0 Periodic Limb Movements. The Periodic Limb Movement (PLM) index was 0/h.   Post-study, the patient indicated that sleep was worse than usual.   DIAGNOSIS 1. Obstructive Sleep Apnea  2. Dysfunctions associated with sleep stages or arousal from sleep  PLANS/RECOMMENDATIONS: 1. This  study was limited by poor sleep efficiency and absence of REM sleep. During her brief sleep (in the supine position) she had resolution of her obstructive sleep apnea with CPAP therapy at 6 cm. I will, therefore, start the patient on home CPAP treatment at a pressure of 6 cm via small nasal pillows with heated humidity. The patient should be reminded to be fully compliant with PAP therapy to improve sleep related symptoms and decrease long term cardiovascular risks. The patient should be reminded, that it may take up to 3 months to get fully used to using PAP with all planned sleep. The earlier full compliance is achieved, the better long term compliance tends to be. Please note that untreated obstructive sleep apnea carries additional perioperative morbidity. Patients with significant obstructive sleep apnea should receive perioperative PAP therapy and the surgeons and particularly the anesthesiologist should be informed of the diagnosis and the severity of the sleep disordered breathing. 2. The patient should be cautioned not to drive, work at heights, or operate dangerous or heavy equipment when tired or sleepy. Review and reiteration of good sleep hygiene measures should be pursued with any patient. 3. This study shows sleep fragmentation and abnormal sleep stage percentages; these are nonspecific findings and per se do not signify an intrinsic sleep disorder or a cause for the patient's sleep-related symptoms. Causes include (but are not limited to) the first night effect of the sleep study, circadian rhythm disturbances, medication effect or an underlying mood disorder or medical problem.  4. The patient will be seen in follow-up by Dr. Rexene Alberts at [facility] for discussion of the test results and further management strategies. The referring provider will be notified of the test results.  I certify that I have reviewed the entire raw data recording prior to the issuance of this report in accordance with the  Standards of Accreditation of the American Academy of Sleep Medicine (AASM)   [name], MD, PhD Diplomat, American Board of Psychiatry and Neurology (Neurology and Sleep Medicine)

## 2016-02-23 NOTE — Progress Notes (Signed)
Patient referred by [name], seen by me on 11/24/15, diagnostic PSG on 01/11/16.    Please call and inform patient that I have entered an order for treatment with positive airway pressure (PAP) treatment of obstructive sleep apnea (OSA). She did fairly during the latest sleep study with very little sleep achieved and no REM sleep. Nevertheless, I will arrange for a CPAP machine for home use through a DME (durable medical equipment) company of Her choice; and I will see the patient back in follow-up in about 8-10 weeks. Please also explain to the patient that I will be looking out for compliance data, which can be downloaded from the machine (stored on an SD card, that is inserted in the machine) or via remote access through a modem, that is built into the machine. At the time of the followup appointment we will discuss sleep study results and how it is going with PAP treatment at home. Please advise patient to bring Her machine at the time of the first FU visit, even though this is cumbersome. Bringing the machine for every visit after that will likely not be needed, but often helps for the first visit to troubleshoot if needed. Please re-enforce the importance of compliance with treatment and the need for [place] to monitor compliance data - often an insurance requirement and actually good feedback for the patient as far as how they are doing.  Also remind patient, that any interim PAP machine or mask issues should be first addressed with the DME company, as they can often help better with technical and mask fit issues. Please ask if patient has a preference regarding DME company.  Please also make sure, the patient has a follow-up appointment with me in about 8-10 weeks from the setup date, thanks.  Once you have spoken to the patient - and faxed/routed report to PCP and referring MD (if other than PCP), you can close this encounter, thanks,   [name], MD, PhD [facility] ([place])

## 2016-02-24 NOTE — Telephone Encounter (Signed)
I spoke to pt and advised her of her sleep study results. Pt is agreeable to starting cpap and asked that the order be sent to [facility]. A follow up appt was made for 05/23/2016 at 3:00pm with Dr. Rexene Alberts. Pt verbalized understanding of results. Pt had no questions at this time but was encouraged to call back if questions arise.

## 2016-03-15 ENCOUNTER — Ambulatory Visit (INDEPENDENT_AMBULATORY_CARE_PROVIDER_SITE_OTHER): Payer: Medicare Other | Admitting: Urgent Care

## 2016-03-15 ENCOUNTER — Encounter: Payer: Self-pay | Admitting: Urgent Care

## 2016-03-15 VITALS — BP 124/80 | HR 61 | Temp 98.1°F | Resp 17 | Ht 62.5 in | Wt 181.0 lb

## 2016-03-15 DIAGNOSIS — R42 Dizziness and giddiness: Secondary | ICD-10-CM

## 2016-03-15 LAB — POCT CBC
Granulocyte percent: 61.8 %G (ref 37–80)
HCT, POC: 39 % (ref 37.7–47.9)
HEMOGLOBIN: 13.5 g/dL (ref 12.2–16.2)
LYMPH, POC: 2.5 (ref 0.6–3.4)
MCH, POC: 28 pg (ref 27–31.2)
MCHC: 34.8 g/dL (ref 31.8–35.4)
MCV: 80.5 fL (ref 80–97)
MID (cbc): 0.4 (ref 0–0.9)
MPV: 7.1 fL (ref 0–99.8)
POC Granulocyte: 4.8 (ref 2–6.9)
POC LYMPH PERCENT: 32.6 %L (ref 10–50)
POC MID %: 5.6 % (ref 0–12)
Platelet Count, POC: 243 10*3/uL (ref 142–424)
RBC: 4.84 M/uL (ref 4.04–5.48)
RDW, POC: 15 %
WBC: 7.8 10*3/uL (ref 4.6–10.2)

## 2016-03-15 LAB — POCT URINALYSIS DIP (MANUAL ENTRY)
Bilirubin, UA: NEGATIVE
Blood, UA: NEGATIVE
GLUCOSE UA: NEGATIVE
Ketones, POC UA: NEGATIVE
Leukocytes, UA: NEGATIVE
Nitrite, UA: NEGATIVE
PH UA: 7
PROTEIN UA: NEGATIVE
SPEC GRAV UA: 1.01
UROBILINOGEN UA: 0.2

## 2016-03-15 LAB — POCT GLYCOSYLATED HEMOGLOBIN (HGB A1C): Hemoglobin A1C: 5.6

## 2016-03-15 LAB — GLUCOSE, POCT (MANUAL RESULT ENTRY): POC Glucose: 105 mg/dl — AB (ref 70–99)

## 2016-03-15 NOTE — Progress Notes (Signed)
MRN: [id number] DOB: December 10, 1945  Subjective:   [name] is a 71 y.o. female presenting for chief complaint of Dizziness and Dysuria  Dizziness - Reports 2 day history of dizziness. Feels like her head is spinning. Laying down elicits the vertigo after a couple of minutes, queezy. Has also had right ear pressure, right sided neck stiffness/pain this morning that was transient in nature. Had an upper respiratory infection recently, now improved except has lingering cough. Reports history of BPPV. Has not tried medications for relief. Does not like to take meclizine for dizziness due to sedation and drowsiness. She does not hydrate well, drinks coffee. Tries to eat healthily. Denies sinus pain, congestion, sore throat, heart racing, palpitations, chest pain, shob. Denies smoking cigarettes. Manages HTN and thyroid disease well. She no longer sees a cardiologist given that her blood pressure is well controlled.   Shanin has a current medication list which includes the following prescription(s): benazepril-hydrochlorthiazide, carvedilol, hydralazine, levothyroxine, liothyronine, and benzonatate. Also is allergic to bactrim [sulfamethoxazole-trimethoprim]; epinephrine; erythromycin; and sulfa antibiotics.  [name]  has a past medical history of Abnormal perimenopausal bleeding (2005); Allergy; Anemia; Anxiety; Dysuria (2004); Endometrial polyp (2006); Environmental allergies; Fibroid (02/19/03); H/O varicella; History of measles, mumps, or rubella; colonic polyps (12/15/2010); Hypertension (07-19-2010); Hypothyroidism; Interstitial cystitis; Menopausal symptoms (2004); Obesity; Osteopenia (02/2012); PMB (postmenopausal bleeding) (06/21/10); Sleep apnea; Urinary incontinence (2011); and Vitamin D deficiency. Also  has a past surgical history that includes Myomectomy (1974); Hysteroscopy (2006 and May 2012); Mouth surgery; and Colonoscopy (12/15/10).  Objective:   Vitals: BP 124/80 (BP Location:  Right Arm, Patient Position: Sitting, Cuff Size: Normal)   Pulse 61   Temp 98.1 F (36.7 C) (Oral)   Resp 17   Ht 5' 2.5" (1.588 m)   Wt 181 lb (82.1 kg)   SpO2 95%   BMI 32.58 kg/m   BP Readings from Last 3 Encounters:  03/15/16 124/80  02/19/16 122/70  11/24/15 (P) 140/82   Physical Exam  Constitutional: She is oriented to person, place, and time. She appears well-developed and well-nourished.  HENT:  Right TM cerumen occluded. No tragus tenderness. Nasal turbinates pink and moist without sinus tenderness. Throat without oropharyngeal exudates, erythema or abscesses.  Eyes: Right eye exhibits no discharge. Left eye exhibits no discharge.  Neck: Normal range of motion. Neck supple. No thyromegaly present.  Cardiovascular: Normal rate, regular rhythm and intact distal pulses.  Exam reveals no gallop and no friction rub.   No murmur heard. Pulmonary/Chest: No respiratory distress. She has no wheezes. She has no rales.  Lymphadenopathy:    She has no cervical adenopathy.  Neurological: She is alert and oriented to person, place, and time.  Skin: Skin is warm and dry.   Results for orders placed or performed in visit on 03/15/16 (from the past 24 hour(s))  POCT urinalysis dipstick     Status: None   Collection Time: 03/15/16  5:59 PM  Result Value Ref Range   Color, UA yellow yellow   Clarity, UA clear clear   Glucose, UA negative negative   Bilirubin, UA negative negative   Ketones, POC UA negative negative   Spec Grav, UA 1.010    Blood, UA negative negative   pH, UA 7.0    Protein Ur, POC negative negative   Urobilinogen, UA 0.2    Nitrite, UA Negative Negative   Leukocytes, UA Negative Negative  POCT glucose (manual entry)     Status: Abnormal   Collection Time: 03/15/16  6:00 PM  Result Value Ref Range   POC Glucose 105 (A) 70 - 99 mg/dl  POCT glycosylated hemoglobin (Hb A1C)     Status: None   Collection Time: 03/15/16  6:01 PM  Result Value Ref Range    Hemoglobin A1C 5.6   POCT CBC     Status: None   Collection Time: 03/15/16  6:07 PM  Result Value Ref Range   WBC 7.8 4.6 - 10.2 K/uL   Lymph, poc 2.5 0.6 - 3.4   POC LYMPH PERCENT 32.6 10 - 50 %L   MID (cbc) 0.4 0 - 0.9   POC MID % 5.6 0 - 12 %M   POC Granulocyte 4.8 2 - 6.9   Granulocyte percent 61.8 37 - 80 %G   RBC 4.84 4.04 - 5.48 M/uL   Hemoglobin 13.5 12.2 - 16.2 g/dL   HCT, POC 39.0 37.7 - 47.9 %   MCV 80.5 80 - 97 fL   MCH, POC 28.0 27 - 31.2 pg   MCHC 34.8 31.8 - 35.4 g/dL   RDW, POC 15.0 %   Platelet Count, POC 243 142 - 424 K/uL   MPV 7.1 0 - 99.8 fL   ECG interpretation - Q-wave in lead III, flattened T-wave in V3, bradycardia. Otherwise, patient is in sinus rhythm. No change from previous ecg in 2013.   Assessment and Plan :   This case was precepted with Dr. Mitchel Honour.   1. Vertigo - Work-up is very reassuring. Patient was very reluctant to have her ear cleaned stating that it would make her dizziness worse. She was also reluctant to lay down and refused orthostatics for the same reason due to worsening dizziness. I advised that patient hydrate very well for the next 24 hours, limit coffee, eat well balanced meal. Recheck tomorrow afternoon. Patient verbalized understanding.  Jaynee Eagles, PA-C [facility] [phone] 03/15/2016  5:11 PM

## 2016-03-15 NOTE — Patient Instructions (Addendum)
Dizziness Dizziness is a common problem. It is a feeling of unsteadiness or light-headedness. You may feel like you are about to faint. Dizziness can lead to injury if you stumble or fall. Anyone can become dizzy, but dizziness is more common in older adults. This condition can be caused by a number of things, including medicines, dehydration, or illness. Follow these instructions at home: Taking these steps may help with your condition: Eating and drinking   Drink enough fluid to keep your urine clear or pale yellow. This helps to keep you from becoming dehydrated. Try to drink more clear fluids, such as water.  Do not drink alcohol.  Limit your caffeine intake if directed by your health care provider.  Limit your salt intake if directed by your health care provider. Activity   Avoid making quick movements.  Rise slowly from chairs and steady yourself until you feel okay.  In the morning, first sit up on the side of the bed. When you feel okay, stand slowly while you hold onto something until you know that your balance is fine.  Move your legs often if you need to stand in one place for a long time. Tighten and relax your muscles in your legs while you are standing.  Do not drive or operate heavy machinery if you feel dizzy.  Avoid bending down if you feel dizzy. Place items in your home so that they are easy for you to reach without leaning over. Lifestyle   Do not use any tobacco products, including cigarettes, chewing tobacco, or electronic cigarettes. If you need help quitting, ask your health care provider.  Try to reduce your stress level, such as with yoga or meditation. Talk with your health care provider if you need help. General instructions   Watch your dizziness for any changes.  Take medicines only as directed by your health care provider. Talk with your health care provider if you think that your dizziness is caused by a medicine that you are taking.  Tell a friend  or a family member that you are feeling dizzy. If he or she notices any changes in your behavior, have this person call your health care provider.  Keep all follow-up visits as directed by your health care provider. This is important. Contact a health care provider if:  Your dizziness does not go away.  Your dizziness or light-headedness gets worse.  You feel nauseous.  You have reduced hearing.  You have new symptoms.  You are unsteady on your feet or you feel like the room is spinning. Get help right away if:  You vomit or have diarrhea and are unable to eat or drink anything.  You have problems talking, walking, swallowing, or using your arms, hands, or legs.  You feel generally weak.  You are not thinking clearly or you have trouble forming sentences. It may take a friend or family member to notice this.  You have chest pain, abdominal pain, shortness of breath, or sweating.  Your vision changes.  You notice any bleeding.  You have a headache.  You have neck pain or a stiff neck.  You have a fever. This information is not intended to replace advice given to you by your health care provider. Make sure you discuss any questions you have with your health care provider. Document Released: 08/16/2000 Document Revised: 07/29/2015 Document Reviewed: 02/16/2014 Elsevier Interactive Patient Education  2017 Elsevier Inc.     IF you received an x-ray today, you will receive an   invoice from [facility]. Please contact [facility] at [phone] with questions or concerns regarding your invoice.   IF you received labwork today, you will receive an invoice from [facility]. Please contact [facility] at [phone] with questions or concerns regarding your invoice.   Our billing staff will not be able to assist you with questions regarding bills from these companies.  You will be contacted with the lab results as soon as they are available. The fastest way to  get your results is to activate your My Chart account. Instructions are located on the last page of this paperwork. If you have not heard from us regarding the results in 2 weeks, please contact this office.      

## 2016-03-16 ENCOUNTER — Encounter (HOSPITAL_COMMUNITY): Payer: Self-pay | Admitting: Emergency Medicine

## 2016-03-16 ENCOUNTER — Emergency Department (HOSPITAL_COMMUNITY)
Admission: EM | Admit: 2016-03-16 | Discharge: 2016-03-16 | Disposition: A | Discharge status: Home / Self Care | Payer: Medicare Other | Attending: Emergency Medicine | Admitting: Emergency Medicine

## 2016-03-16 ENCOUNTER — Emergency Department (HOSPITAL_COMMUNITY): Payer: Medicare Other

## 2016-03-16 ENCOUNTER — Ambulatory Visit (INDEPENDENT_AMBULATORY_CARE_PROVIDER_SITE_OTHER): Payer: Medicare Other | Admitting: Urgent Care

## 2016-03-16 VITALS — BP 220/84 | HR 58 | Temp 98.9°F | Resp 16 | Wt 180.6 lb

## 2016-03-16 DIAGNOSIS — R42 Dizziness and giddiness: Secondary | ICD-10-CM | POA: Insufficient documentation

## 2016-03-16 DIAGNOSIS — I160 Hypertensive urgency: Secondary | ICD-10-CM | POA: Diagnosis not present

## 2016-03-16 DIAGNOSIS — E039 Hypothyroidism, unspecified: Secondary | ICD-10-CM | POA: Diagnosis not present

## 2016-03-16 DIAGNOSIS — R110 Nausea: Secondary | ICD-10-CM | POA: Diagnosis not present

## 2016-03-16 DIAGNOSIS — I10 Essential (primary) hypertension: Secondary | ICD-10-CM | POA: Diagnosis not present

## 2016-03-16 DIAGNOSIS — R030 Elevated blood-pressure reading, without diagnosis of hypertension: Secondary | ICD-10-CM | POA: Diagnosis not present

## 2016-03-16 LAB — URINALYSIS, MICROSCOPIC ONLY
Bacteria, UA: NONE SEEN
Casts: NONE SEEN /lpf

## 2016-03-16 LAB — CBC WITH DIFFERENTIAL/PLATELET
Basophils Absolute: 0 10*3/uL (ref 0.0–0.1)
Basophils Relative: 0 %
EOS ABS: 0.2 10*3/uL (ref 0.0–0.7)
Eosinophils Relative: 2 %
HEMATOCRIT: 38.5 % (ref 36.0–46.0)
HEMOGLOBIN: 12.8 g/dL (ref 12.0–15.0)
LYMPHS ABS: 2.5 10*3/uL (ref 0.7–4.0)
LYMPHS PCT: 33 %
MCH: 27.4 pg (ref 26.0–34.0)
MCHC: 33.2 g/dL (ref 30.0–36.0)
MCV: 82.4 fL (ref 78.0–100.0)
MONOS PCT: 5 %
Monocytes Absolute: 0.4 10*3/uL (ref 0.1–1.0)
NEUTROS ABS: 4.4 10*3/uL (ref 1.7–7.7)
NEUTROS PCT: 60 %
Platelets: 219 10*3/uL (ref 150–400)
RBC: 4.67 MIL/uL (ref 3.87–5.11)
RDW: 14.1 % (ref 11.5–15.5)
WBC: 7.5 10*3/uL (ref 4.0–10.5)

## 2016-03-16 LAB — BASIC METABOLIC PANEL
Anion gap: 10 (ref 5–15)
BUN: 11 mg/dL (ref 6–20)
CHLORIDE: 104 mmol/L (ref 101–111)
CO2: 25 mmol/L (ref 22–32)
CREATININE: 0.6 mg/dL (ref 0.44–1.00)
Calcium: 8.8 mg/dL — ABNORMAL LOW (ref 8.9–10.3)
GFR calc Af Amer: >60 mL/min (ref >60)
GFR calc non Af Amer: >60 mL/min (ref >60)
GLUCOSE: 93 mg/dL (ref 65–99)
POTASSIUM: 3.4 mmol/L — AB (ref 3.5–5.1)
Sodium: 139 mmol/L (ref 135–145)

## 2016-03-16 LAB — I-STAT TROPONIN, ED: Troponin i, poc: 0 ng/mL (ref 0.00–0.08)

## 2016-03-16 MED ORDER — HYDRALAZINE HCL 25 MG PO TABS
50.0000 mg | ORAL_TABLET | Freq: Once | ORAL | Status: AC
  Administered 2016-03-16: 50 mg via ORAL
  Filled 2016-03-16: qty 2

## 2016-03-16 MED ORDER — CARVEDILOL 12.5 MG PO TABS: 6.2500 mg | ORAL_TABLET | Freq: Two times a day (BID) | ORAL | Status: DC

## 2016-03-16 NOTE — ED Notes (Signed)
EDP at bedside  

## 2016-03-16 NOTE — ED Notes (Signed)
E-signature not working, pt verbalized understanding of DC instructions

## 2016-03-16 NOTE — ED Notes (Signed)
Pt requesting "disability note" for [facility]? Wants consult to social work.

## 2016-03-16 NOTE — Progress Notes (Signed)
MRN: [id number] DOB: 09-22-1945  Subjective:   [name] is a 71 y.o. female presenting for follow up on vertigo. She was last seen yesterday, physical exam findings and work up was reassuring. She advised to hydrate well, rest and rtc for follow up today. She reports that she continues to have vertigo when she lays down or changes positions. Also has nausea without vomiting. Denies chest pain, shob, heart racing, diaphoresis, abdominal pain, headache, confusion, facial droop.    [name] has a current medication list which includes the following prescription(s): benazepril-hydrochlorthiazide, carvedilol, hydralazine, levothyroxine, and liothyronine. Also is allergic to bactrim [sulfamethoxazole-trimethoprim]; epinephrine; erythromycin; and sulfa antibiotics.  [name]  has a past medical history of Abnormal perimenopausal bleeding (2005); Allergy; Anemia; Anxiety; Dysuria (2004); Endometrial polyp (2006); Environmental allergies; Fibroid (02/19/03); H/O varicella; History of measles, mumps, or rubella; colonic polyps (12/15/2010); Hypertension (07-19-2010); Hypothyroidism; Interstitial cystitis; Menopausal symptoms (2004); Obesity; Osteopenia (02/2012); PMB (postmenopausal bleeding) (06/21/10); Sleep apnea; Urinary incontinence (2011); and Vitamin D deficiency. Also  has a past surgical history that includes Myomectomy (1974); Hysteroscopy (2006 and May 2012); Mouth surgery; and Colonoscopy (12/15/10).  Objective:   Vitals: BP (!) 220/84   Pulse (!) 58   Temp 98.9 F (37.2 C) (Oral)   Resp 16   Wt 180 lb 9.6 oz (81.9 kg)   SpO2 98%   BMI 32.51 kg/m   Physical Exam  Constitutional: She is oriented to person, place, and time. She appears well-developed and well-nourished.  HENT:  Mouth/Throat: Oropharynx is clear and moist.  Eyes: Pupils are equal, round, and reactive to light. No scleral icterus.  Neck: Normal range of motion. Neck supple.  Cardiovascular: Normal rate, regular  rhythm and intact distal pulses.  Exam reveals no gallop and no friction rub.   Murmur (not new, soft systolic ejection murmur located over LUSB) heard. Pulmonary/Chest: No respiratory distress. She has no wheezes. She has no rales.  Abdominal: Soft. Bowel sounds are normal. She exhibits no distension and no mass. There is no tenderness. There is no guarding.  Musculoskeletal: She exhibits no edema.  Lymphadenopathy:    She has no cervical adenopathy.  Neurological: She is alert and oriented to person, place, and time. No cranial nerve deficit.  Skin: Skin is warm and dry.   Patient refuses to lay down on exam table due to dizziness.   Results for orders placed or performed in visit on 03/15/16 (from the past 72 hour(s))  Urine Microscopic     Status: None   Collection Time: 03/15/16  5:25 PM  Result Value Ref Range   WBC, UA 0-5 0 - 5 /hpf   RBC, UA 0-2 0 - 2 /hpf   Epithelial Cells (non renal) 0-10 0 - 10 /hpf   Casts None seen None seen /lpf   Bacteria, UA None seen None seen/Few  POCT urinalysis dipstick     Status: None   Collection Time: 03/15/16  5:59 PM  Result Value Ref Range   Color, UA yellow yellow   Clarity, UA clear clear   Glucose, UA negative negative   Bilirubin, UA negative negative   Ketones, POC UA negative negative   Spec Grav, UA 1.010    Blood, UA negative negative   pH, UA 7.0    Protein Ur, POC negative negative   Urobilinogen, UA 0.2    Nitrite, UA Negative Negative   Leukocytes, UA Negative Negative  POCT glucose (manual entry)     Status: Abnormal  Collection Time: 03/15/16  6:00 PM  Result Value Ref Range   POC Glucose 105 (A) 70 - 99 mg/dl  POCT glycosylated hemoglobin (Hb A1C)     Status: None   Collection Time: 03/15/16  6:01 PM  Result Value Ref Range   Hemoglobin A1C 5.6   POCT CBC     Status: None   Collection Time: 03/15/16  6:07 PM  Result Value Ref Range   WBC 7.8 4.6 - 10.2 K/uL   Lymph, poc 2.5 0.6 - 3.4   POC LYMPH PERCENT  32.6 10 - 50 %L   MID (cbc) 0.4 0 - 0.9   POC MID % 5.6 0 - 12 %M   POC Granulocyte 4.8 2 - 6.9   Granulocyte percent 61.8 37 - 80 %G   RBC 4.84 4.04 - 5.48 M/uL   Hemoglobin 13.5 12.2 - 16.2 g/dL   HCT, POC 39.0 37.7 - 47.9 %   MCV 80.5 80 - 97 fL   MCH, POC 28.0 27 - 31.2 pg   MCHC 34.8 31.8 - 35.4 g/dL   RDW, POC 15.0 %   Platelet Count, POC 243 142 - 424 K/uL   MPV 7.1 0 - 99.8 fL   Assessment and Plan :   Case precepted with Dr. Mitchel Honour who saw patient with me as well yesterday, 03/16/2016.  1. Dizziness 2. Essential hypertension, benign 3. Elevated blood pressure reading 4. Nausea without vomiting 5. Hypertensive urgency - Will send patient via EMS to [facility] for hypertensive urgency, r/o intracranial process given patient's persistent dizziness and now elevated blood pressure, checked twice manually. I personally reported case to Charge Nurse, Cecille Rubin, who verbalized understanding.  Jaynee Eagles, PA-C [facility] [phone] 03/16/2016 6:20 PM

## 2016-03-16 NOTE — ED Triage Notes (Signed)
BIB EMS from [facility] C/O dizziness since tueday. Went to [facility] yesterday, came back today for followup. Pt hypertensive at [facility] (220/84) No neuro deficits, no HA, ambulatory. Sinus brady on monitor (pt normal)

## 2016-03-16 NOTE — Discharge Instructions (Signed)
I would avoid taking your carvedilol if your HR is 50 or below.  I advise to not drive until symptoms resolve.

## 2016-03-17 ENCOUNTER — Ambulatory Visit (INDEPENDENT_AMBULATORY_CARE_PROVIDER_SITE_OTHER): Payer: Medicare Other | Admitting: Urgent Care

## 2016-03-17 ENCOUNTER — Telehealth: Payer: Self-pay

## 2016-03-17 VITALS — BP 130/80 | HR 59 | Temp 97.5°F | Resp 14 | Ht 62.5 in | Wt 179.0 lb

## 2016-03-17 DIAGNOSIS — E876 Hypokalemia: Secondary | ICD-10-CM

## 2016-03-17 DIAGNOSIS — E039 Hypothyroidism, unspecified: Secondary | ICD-10-CM | POA: Diagnosis not present

## 2016-03-17 DIAGNOSIS — R42 Dizziness and giddiness: Secondary | ICD-10-CM | POA: Diagnosis not present

## 2016-03-17 DIAGNOSIS — I10 Essential (primary) hypertension: Secondary | ICD-10-CM | POA: Diagnosis not present

## 2016-03-17 NOTE — Patient Instructions (Addendum)
If instructed, eat more foods that contain a lot of potassium, such as:  Nuts, such as peanuts and pistachios.  Seeds, such as sunflower seeds and pumpkin seeds.  Peas, lentils, and lima beans.  Whole grain and bran cereals and breads.  Fresh fruits and vegetables, such as apricots, avocado, bananas, cantaloupe, kiwi, oranges, tomatoes, asparagus, and potatoes.  Orange juice.  Tomato juice.  Red meats.  Yogurt.   Dizziness Dizziness is a common problem. It is a feeling of unsteadiness or light-headedness. You may feel like you are about to faint. Dizziness can lead to injury if you stumble or fall. Anyone can become dizzy, but dizziness is more common in older adults. This condition can be caused by a number of things, including medicines, dehydration, or illness. Follow these instructions at home: Taking these steps may help with your condition: Eating and drinking  Drink enough fluid to keep your urine clear or pale yellow. This helps to keep you from becoming dehydrated. Try to drink more clear fluids, such as water.  Do not drink alcohol.  Limit your caffeine intake if directed by your health care provider.  Limit your salt intake if directed by your health care provider. Activity  Avoid making quick movements.  Rise slowly from chairs and steady yourself until you feel okay.  In the morning, first sit up on the side of the bed. When you feel okay, stand slowly while you hold onto something until you know that your balance is fine.  Move your legs often if you need to stand in one place for a long time. Tighten and relax your muscles in your legs while you are standing.  Do not drive or operate heavy machinery if you feel dizzy.  Avoid bending down if you feel dizzy. Place items in your home so that they are easy for you to reach without leaning over. Lifestyle  Do not use any tobacco products, including cigarettes, chewing tobacco, or electronic cigarettes. If  you need help quitting, ask your health care provider.  Try to reduce your stress level, such as with yoga or meditation. Talk with your health care provider if you need help. General instructions  Watch your dizziness for any changes.  Take medicines only as directed by your health care provider. Talk with your health care provider if you think that your dizziness is caused by a medicine that you are taking.  Tell a friend or a family member that you are feeling dizzy. If he or she notices any changes in your behavior, have this person call your health care provider.  Keep all follow-up visits as directed by your health care provider. This is important. Contact a health care provider if:  Your dizziness does not go away.  Your dizziness or light-headedness gets worse.  You feel nauseous.  You have reduced hearing.  You have new symptoms.  You are unsteady on your feet or you feel like the room is spinning. Get help right away if:  You vomit or have diarrhea and are unable to eat or drink anything.  You have problems talking, walking, swallowing, or using your arms, hands, or legs.  You feel generally weak.  You are not thinking clearly or you have trouble forming sentences. It may take a friend or family member to notice this.  You have chest pain, abdominal pain, shortness of breath, or sweating.  Your vision changes.  You notice any bleeding.  You have a headache.  You have neck pain  or a stiff neck.  You have a fever. This information is not intended to replace advice given to you by your health care provider. Make sure you discuss any questions you have with your health care provider. Document Released: 08/16/2000 Document Revised: 07/29/2015 Document Reviewed: 02/16/2014 Elsevier Interactive Patient Education  2017 [employer] Self-Care WHAT IS THE EPLEY MANEUVER? The Epley maneuver is an exercise you can do to relieve symptoms of benign  paroxysmal positional vertigo (BPPV). This condition is often just referred to as vertigo. BPPV is caused by the movement of tiny crystals (canaliths) inside your inner ear. The accumulation and movement of canaliths in your inner ear causes a sudden spinning sensation (vertigo) when you move your head to certain positions. Vertigo usually lasts about 30 seconds. BPPV usually occurs in just one ear. If you get vertigo when you lie on your left side, you probably have BPPV in your left ear. Your health care provider can tell you which ear is involved.  BPPV may be caused by a head injury. Many people older than 50 get BPPV for unknown reasons. If you have been diagnosed with BPPV, your health care provider may teach you how to do this maneuver. BPPV is not life threatening (benign) and usually goes away in time.  WHEN SHOULD I PERFORM THE EPLEY MANEUVER? You can do this maneuver at home whenever you have symptoms of vertigo. You may do the Epley maneuver up to 3 times a day until your symptoms of vertigo go away. HOW SHOULD I DO THE EPLEY MANEUVER? 1. Sit on the edge of a bed or table with your back straight. Your legs should be extended or hanging over the edge of the bed or table.  2. Turn your head halfway toward the affected ear.  3. Lie backward quickly with your head turned until you are lying flat on your back. You may want to position a pillow under your shoulders.  4. Hold this position for 30 seconds. You may experience an attack of vertigo. This is normal. Hold this position until the vertigo stops. 5. Then turn your head to the opposite direction until your unaffected ear is facing the floor.  6. Hold this position for 30 seconds. You may experience an attack of vertigo. This is normal. Hold this position until the vertigo stops. 7. Now turn your whole body to the same side as your head. Hold for another 30 seconds.  8. You can then sit back up. ARE THERE RISKS TO THIS MANEUVER? In some  cases, you may have other symptoms (such as changes in your vision, weakness, or numbness). If you have these symptoms, stop doing the maneuver and call your health care provider. Even if doing these maneuvers relieves your vertigo, you may still have dizziness. Dizziness is the sensation of light-headedness but without the sensation of movement. Even though the Epley maneuver may relieve your vertigo, it is possible that your symptoms will return within 5 years. WHAT SHOULD I DO AFTER THIS MANEUVER? After doing the Epley maneuver, you can return to your normal activities. Ask your doctor if there is anything you should do at home to prevent vertigo. This may include:  Sleeping with two or more pillows to keep your head elevated.  Not sleeping on the side of your affected ear.  Getting up slowly from bed.  Avoiding sudden movements during the day.  Avoiding extreme head movement, like looking up or bending over.  Wearing a  cervical collar to prevent sudden head movements. WHAT SHOULD I DO IF MY SYMPTOMS GET WORSE? Call your health care provider if your vertigo gets worse. Call your provider right way if you have other symptoms, including:   Nausea.  Vomiting.  Headache.  Weakness.  Numbness.  Vision changes. This information is not intended to replace advice given to you by your health care provider. Make sure you discuss any questions you have with your health care provider. Document Released: 02/25/2013 Document Reviewed: 02/25/2013 Elsevier Interactive Patient Education  2017 [employer].

## 2016-03-17 NOTE — Progress Notes (Signed)
MRN: [id number] DOB: 08-Aug-1945  Subjective:   [name] is a 71 y.o. female presenting for follow up on dizziness and vertigo. This is her 3rd visit for follow up in as many days. Lab work and imaging has been reassuring. She was advised to stop her coreg last night after being sent to the ED to further evaluate HTN urgency and r/o intracranial process. She has a history of BPPV but has not had issues with this for the past 2 years. She has seen ENT before for this. Today, she reports ongoing dizziness, is still having difficulty laying down and has intermittent nausea associated with this. She denies confusion, headache, chest pain, n/v, belly pain. She has a history of hypothyroidism and has been steady with her thyroid medication for the past 3 years.   [name] has a current medication list which includes the following prescription(s): benazepril-hydrochlorthiazide, hydralazine, levothyroxine, liothyronine, and carvedilol. Also is allergic to bactrim [sulfamethoxazole-trimethoprim]; epinephrine; erythromycin; and sulfa antibiotics.  [name]  has a past medical history of Abnormal perimenopausal bleeding (2005); Allergy; Anemia; Anxiety; Dysuria (2004); Endometrial polyp (2006); Environmental allergies; Fibroid (02/19/03); H/O varicella; History of measles, mumps, or rubella; colonic polyps (12/15/2010); Hypertension (07-19-2010); Hypothyroidism; Interstitial cystitis; Menopausal symptoms (2004); Obesity; Osteopenia (02/2012); PMB (postmenopausal bleeding) (06/21/10); Sleep apnea; Urinary incontinence (2011); and Vitamin D deficiency. Also  has a past surgical history that includes Myomectomy (1974); Hysteroscopy (2006 and May 2012); Mouth surgery; and Colonoscopy (12/15/10).  Objective:   Vitals: BP 130/80   Pulse (!) 59   Temp 97.5 F (36.4 C)   Resp 14   Ht 5' 2.5" (1.588 m)   Wt 179 lb (81.2 kg)   BMI 32.22 kg/m   Physical Exam  Constitutional: She is oriented to person,  place, and time. She appears well-developed and well-nourished.  HENT:  Mouth/Throat: Oropharynx is clear and moist.  Eyes: EOM are normal. Pupils are equal, round, and reactive to light.  Cardiovascular: Normal rate, regular rhythm and intact distal pulses.  Exam reveals no gallop and no friction rub.   No murmur heard. Pulmonary/Chest: No respiratory distress. She has no wheezes. She has no rales.  Neurological: She is alert and oriented to person, place, and time.  Skin: Skin is warm and dry.   Results for orders placed or performed during the hospital encounter of 03/16/16 (from the past 24 hour(s))  I-stat troponin, ED     Status: None   Collection Time: 03/16/16  8:52 PM  Result Value Ref Range   Troponin i, poc 0.00 0.00 - 0.08 ng/mL   Comment 3          CBC with Differential/Platelet     Status: None   Collection Time: 03/16/16  8:56 PM  Result Value Ref Range   WBC 7.5 4.0 - 10.5 K/uL   RBC 4.67 3.87 - 5.11 MIL/uL   Hemoglobin 12.8 12.0 - 15.0 g/dL   HCT 38.5 36.0 - 46.0 %   MCV 82.4 78.0 - 100.0 fL   MCH 27.4 26.0 - 34.0 pg   MCHC 33.2 30.0 - 36.0 g/dL   RDW 14.1 11.5 - 15.5 %   Platelets 219 150 - 400 K/uL   Neutrophils Relative % 60 %   Neutro Abs 4.4 1.7 - 7.7 K/uL   Lymphocytes Relative 33 %   Lymphs Abs 2.5 0.7 - 4.0 K/uL   Monocytes Relative 5 %   Monocytes Absolute 0.4 0.1 - 1.0 K/uL   Eosinophils Relative 2 %  Eosinophils Absolute 0.2 0.0 - 0.7 K/uL   Basophils Relative 0 %   Basophils Absolute 0.0 0.0 - 0.1 K/uL  Basic metabolic panel     Status: Abnormal   Collection Time: 03/16/16  8:56 PM  Result Value Ref Range   Sodium 139 135 - 145 mmol/L   Potassium 3.4 (L) 3.5 - 5.1 mmol/L   Chloride 104 101 - 111 mmol/L   CO2 25 22 - 32 mmol/L   Glucose, Bld 93 65 - 99 mg/dL   BUN 11 6 - 20 mg/dL   Creatinine, Ser 0.60 0.44 - 1.00 mg/dL   Calcium 8.8 (L) 8.9 - 10.3 mg/dL   GFR calc non Af Amer >60 >60 mL/min   GFR calc Af Amer >60 >60 mL/min   Anion gap  10 5 - 15   Assessment and Plan :   1. Vertigo - Ambulatory referral to ENT pending. Consider referral to neurology.  2. Hypokalemia - Recommended increased K+ rich foods. Monitor.  3. Essential hypertension, benign - Improved, stay off of coreg. Monitor BP at home.  4. Hypothyroidism, unspecified type - Thyroid Panel With TSH; Future lab pending   Jaynee Eagles, PA-C [facility] [phone] 03/17/2016 6:15 PM

## 2016-03-17 NOTE — Telephone Encounter (Signed)
Pt has questions about the medication she takes regarding her heart rate   Best number [phone]

## 2016-03-18 ENCOUNTER — Encounter: Payer: Self-pay | Admitting: Urgent Care

## 2016-03-19 NOTE — ED Provider Notes (Signed)
[facility] DEPT Provider Note   CSN: [id number] Arrival date & time: 03/16/16  2007     History   Chief Complaint Chief Complaint  Patient presents with  . Dizziness    HPI [name] is a 71 y.o. female.   Dizziness  Quality:  Head spinning Severity:  Mild Onset quality:  Gradual Duration:  2 days Timing:  Sporadic Progression:  Unchanged Chronicity:  Recurrent Context: bending over and head movement   Relieved by:  Being still and change in position Associated symptoms: no blood in stool, no chest pain and no nausea   Risk factors: hx of vertigo     Past Medical History:  Diagnosis Date  . Abnormal perimenopausal bleeding 2005  . Allergy   . Anemia   . Anxiety   . Dysuria 2004  . Endometrial polyp 2006  . Environmental allergies   . Fibroid 02/19/03  . H/O varicella   . History of measles, mumps, or rubella   . Hx of colonic polyps 12/15/2010  . Hypertension 07-19-2010   echo for pre-op EF 55%  normal left wall thickness LA mildly dialated with mitral and tricuspid regurgatation  . Hypothyroidism   . Interstitial cystitis   . Menopausal symptoms 2004  . Obesity   . Osteopenia 02/2012   -1.8 T score right femur neck  . PMB (postmenopausal bleeding) 06/21/10  . Sleep apnea   . Urinary incontinence 2011  . Vitamin D deficiency    history of    Patient Active Problem List   Diagnosis Date Noted  . BMI 35.0-35.9,adult 11/16/2015  . Essential hypertension, benign 04/28/2012  . Osteoarthritis of left knee 04/28/2012  . Hypothyroidism 04/28/2012  . Hx of colonic polyps 12/15/2010  . Anxiety 11/17/2010  . Sleep apnea 11/17/2010    Past Surgical History:  Procedure Laterality Date  . COLONOSCOPY  12/15/10  . HYSTEROSCOPY  2006 and May 2012   for fibroids, endometrial polyps  . MOUTH SURGERY    . MYOMECTOMY  1974    OB History    No data available       Home Medications    Prior to Admission medications   Medication Sig Start  Date End Date Taking? Authorizing Provider  benazepril-hydrochlorthiazide (LOTENSIN HCT) 20-12.5 MG tablet Take one twice daily for blood pressure 11/16/15   Chelle Jeffery, PA-C  hydrALAZINE (APRESOLINE) 25 MG tablet Take 2 in the morning, 1 at lunch, and 2 at dinner for blood pressure 11/16/15   Chelle Jeffery, PA-C  levothyroxine (SYNTHROID, LEVOTHROID) 112 MCG tablet TAKE 1 TABLET (112 MCG TOTAL) BY MOUTH DAILY BEFORE BREAKFAST. 11/12/15   [name], MD  liothyronine (CYTOMEL) 5 MCG tablet TAKE 1 TABLET BY MOUTH DAILY FOR THYROID 02/01/16   [name], MD    Family History Family History  Problem Relation Age of Onset  . Kidney disease Father     from uremia  . Benign prostatic hyperplasia Father   . Hypertension Mother   . Colon cancer Paternal Uncle   . Clotting disorder Maternal Grandmother     ????    Social History Social History  Substance Use Topics  . Smoking status: Never Smoker  . Smokeless tobacco: Never Used  . Alcohol use 0.6 oz/week    1 Glasses of wine per week     Comment: occasional (1-2 times per week)     Allergies   Bactrim [sulfamethoxazole-trimethoprim]; Epinephrine; Erythromycin; and Sulfa antibiotics   Review of Systems Review of Systems  Cardiovascular: Negative for chest pain.  Gastrointestinal: Negative for blood in stool and nausea.  Neurological: Positive for dizziness.  All other systems reviewed and are negative.    Physical Exam Updated Vital Signs BP 185/67   Pulse 60   Temp 97.8 F (36.6 C) (Oral)   Resp 22   Ht 5\' 3"  (1.6 m)   Wt 180 lb (81.6 kg)   SpO2 99%   BMI 31.89 kg/m   Physical Exam  Constitutional: She is oriented to person, place, and time. She appears well-developed and well-nourished.  HENT:  Head: Normocephalic and atraumatic.  Eyes: Conjunctivae and EOM are normal.  Neck: Normal range of motion.  Cardiovascular: Normal rate and regular rhythm.   Pulmonary/Chest: Effort normal. No stridor. No  respiratory distress.  Abdominal: She exhibits no distension.  Neurological: She is alert and oriented to person, place, and time.  No altered mental status, able to give full seemingly accurate history.  Face is symmetric, EOM's intact, pupils equal and reactive, vision intact, tongue and uvula midline without deviation Upper and Lower extremity motor 5/5, intact pain perception in distal extremities, 2+ reflexes in biceps, patella and achilles tendons. Finger to nose normal, heel to shin normal. Walks without assistance or evident ataxia.    Nursing note and vitals reviewed.    ED Treatments / Results  Labs (all labs ordered are listed, but only abnormal results are displayed) Labs Reviewed  BASIC METABOLIC PANEL - Abnormal; Notable for the following:       Result Value   Potassium 3.4 (*)    Calcium 8.8 (*)    All other components within normal limits  CBC WITH DIFFERENTIAL/PLATELET  Randolm Idol, ED    EKG  EKG Interpretation  Date/Time:  Thursday March 16 2016 20:22:56 EST Ventricular Rate:  53 PR Interval:  162 QRS Duration: 78 QT Interval:  460 QTC Calculation: 431 R Axis:   78 Text Interpretation:  Sinus bradycardia Cannot rule out Anterior infarct , age undetermined Abnormal ECG poor baseline (wander), will repeat Confirmed by [facility] MD, Corene Cornea [phone]) on 03/16/2016 8:32:23 PM       Radiology No results found.  Procedures Procedures (including critical care time)  Medications Ordered in ED Medications  hydrALAZINE (APRESOLINE) tablet 50 mg (50 mg Oral Given 03/16/16 2056)     Initial Impression / Assessment and Plan / ED Course  I have reviewed the triage vital signs and the nursing notes.  Pertinent labs & imaging results that were available during my care of the patient were reviewed by me and considered in my medical decision making (see chart for details).  Clinical Course     Vertigo. Improved significantly in ED. slghtly low HR but it is  chronic. Workup in ER ok, doubt central cause for symptoms. Able to ambulate. Vertigo worse when she lies down. Plan for dischagre with ENT follow up as needed along with epley maneuvers.   Final Clinical Impressions(s) / ED Diagnoses   Final diagnoses:  Vertigo    New Prescriptions Discharge Medication List as of 03/16/2016 10:35 PM       Merrily Pew, MD 03/19/16 1128

## 2016-03-20 ENCOUNTER — Telehealth: Payer: Self-pay

## 2016-03-20 NOTE — Telephone Encounter (Signed)
Referrals sent to Dr Pollie Friar office. Patient notified and given their office number.

## 2016-03-20 NOTE — Telephone Encounter (Signed)
Patient called to check on her referral.  She states that her vertigo has gotten worse and would like to try and get in with the ENT as soon as possible.  She also states that afternoons work better for her.  Please advise  [phone]

## 2016-03-20 NOTE — Telephone Encounter (Signed)
Pt already was seen and d/c med

## 2016-03-31 ENCOUNTER — Telehealth: Payer: Self-pay | Admitting: Family Medicine

## 2016-03-31 ENCOUNTER — Telehealth: Payer: Self-pay | Admitting: Neurology

## 2016-03-31 NOTE — Telephone Encounter (Signed)
Please call patient back:  Unfortunately, having sleep apnea and being on treatment for it, does not justify dismissal from jury duty in my medical opinion. When I saw her in Sep 2017, she was compliant with CPAP and I will I would recommend that she continue to stay fully compliant with CPAP therapy.  I would recommend that she talk to her primary care provider about issues with excessive urination and whether or not her diuretic dosing or timing could be changed.

## 2016-03-31 NOTE — Telephone Encounter (Signed)
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 Said she is not sleeping well with the  CPAP and takes naps during the day. She is wanting to know if she can get a letter to dismiss her from jury duty, she is concerned she could fall asleep during the trial. Also concerned she will not able to drive to the courthouse. She also said she takes a diruretic and has to go to the bathroom often anyway but if she gets nervous she has to go more often, she doesn't think she will be allowed to get up for this if she is picked for the jury. Patient also advised she is using the old CPAP, she has not rec'd the new one yet. She requested to get it before 04/10/16 thru [facility]. Her appt on 3/20 is f/u for new cpap. Should appt be c/a.  Please call

## 2016-03-31 NOTE — Telephone Encounter (Signed)
Would you be willing to write this? 

## 2016-03-31 NOTE — Telephone Encounter (Signed)
Patient has jury duty and would like a note stating that she cant attend because she's a diabetic and has to urinate all the time

## 2016-03-31 NOTE — Telephone Encounter (Signed)
Pt would also like for the letter to say that she has anxiety and will pass out if she sees blood along with being diabetic and having to urinate all the time

## 2016-03-31 NOTE — Telephone Encounter (Signed)
Letter requesting excuse x 6 months, due to dizziness. NOT diabetic, is on a DIURETIC, causing frequent urination.  ENT advised her that she does NOT have vertigo, but rather anxiety over upcoming dental procedure.

## 2016-03-31 NOTE — Telephone Encounter (Signed)
I called pt and relayed that sleep apnea and being on treatment does not justify dismissal from jury duty in Dr. Guadelupe Sabin opinion.  She had question about when her appt should be after she gets her new cpap machine.   I told her that 30 day compliance is minimum but appointment could be later depending on availability.  She will call [facility] about new machine delivery and also pcp about her issues about excessive urination.  Pt verbalized understanding.

## 2016-05-17 ENCOUNTER — Other Ambulatory Visit: Payer: Self-pay | Admitting: Physician Assistant

## 2016-05-17 DIAGNOSIS — I10 Essential (primary) hypertension: Secondary | ICD-10-CM

## 2016-05-17 NOTE — Telephone Encounter (Signed)
Patient notified via My Chart. Needs OV for more.  Meds ordered this encounter  Medications  . benazepril-hydrochlorthiazide (LOTENSIN HCT) 20-12.5 MG tablet    Sig: TAKE ONE TABLET BY MOUTH TWICE A DAY FOR BLOOD PRESSURE    Dispense:  180 tablet    Refill:  0    Please notify patient that s/he needs an office visit +/- labsfor additional refills.

## 2016-05-23 ENCOUNTER — Ambulatory Visit: Payer: Self-pay | Admitting: Neurology

## 2016-06-22 ENCOUNTER — Telehealth: Payer: Self-pay

## 2016-06-22 NOTE — Telephone Encounter (Signed)
I called pt and reminded her to bring her cpap to her appt on Monday with Dr. Rexene Alberts. Pt verbalized understanding.

## 2016-06-26 ENCOUNTER — Ambulatory Visit (INDEPENDENT_AMBULATORY_CARE_PROVIDER_SITE_OTHER): Payer: Medicare Other | Admitting: Neurology

## 2016-06-26 ENCOUNTER — Encounter: Payer: Self-pay | Admitting: Neurology

## 2016-06-26 VITALS — BP 160/96 | HR 80 | Ht 62.5 in | Wt 177.0 lb

## 2016-06-26 DIAGNOSIS — Z9989 Dependence on other enabling machines and devices: Secondary | ICD-10-CM

## 2016-06-26 DIAGNOSIS — G4733 Obstructive sleep apnea (adult) (pediatric): Secondary | ICD-10-CM | POA: Diagnosis not present

## 2016-06-26 NOTE — Patient Instructions (Addendum)
We will increase your CPAP pressure to 8 cm.   You can try Melatonin at night for sleep: take 1 mg to 3 mg, one to 2 hours before your bedtime. You can go up to 5 mg if needed. It is over the counter and comes in pill form, chewable form and spray, if you prefer.    Call or email in about 6 weeks, so we can pull another 30 day download.   Please continue using your CPAP regularly. While your insurance requires that you use CPAP at least 4 hours each night on 70% of the nights, I recommend, that you not skip any nights and use it throughout the night if you can. Getting used to CPAP and staying with the treatment long term does take time and patience and discipline. Untreated obstructive sleep apnea when it is moderate to severe can have an adverse impact on cardiovascular health and raise her risk for heart disease, arrhythmias, hypertension, congestive heart failure, stroke and diabetes. Untreated obstructive sleep apnea causes sleep disruption, nonrestorative sleep, and sleep deprivation. This can have an impact on your day to day functioning and cause daytime sleepiness and impairment of cognitive function, memory loss, mood disturbance, and problems focussing. Using CPAP regularly can improve these symptoms.  Keep up the good work! We can see you in 12 months, you can see one of our nurse practitioners as you are stable. I will see you after that.

## 2016-06-26 NOTE — Progress Notes (Signed)
Subjective:    Patient ID: [name] is a 71 y.o. female.  HPI     Interim history:   [name] is a very pleasant 71 year old right-handed woman with an underlying medical history of hypertension, osteoarthritis, hypothyroidism, anxiety, and obesity, who presents for follow-up consultation of her obstructive sleep apnea, after recent sleep study testing. The patient is unaccompanied today. I first met her on 11/24/2015 at the request of her primary care provider, at which time she reported a prior diagnosis of OSA. She needed reevaluation. She had a CPAP machine. I invited her for a sleep study. She had a baseline sleep study, followed by a CPAP titration study. Her baseline sleep study from 01/11/2016 showed a sleep efficiency of only 42.8%, sleep latency was 146 minutes which is markedly delayed, REM latency was also delayed at 189.5 minutes. She had an increased percentage of stage I sleep, and a decreased percentage of REM sleep. Total AHI was 12.4 per hour, REM AHI was 41.3 per hour, supine AHI was 22.3 per hour. Average oxygen saturation was 98%, nadir was 84%. She had mild PLMS with an index of 29.5 per hour and minimal arousals. Based on her medical history and test results and sleep related complaints I invited her for a full night CPAP titration study which she had on 02/17/2016. Sleep efficiency was only 22%, sleep latency was 128.5 minutes, REM was absent. She had an increased percentage of slow-wave sleep, an increased percentage of stage I sleep. CPAP was titrated from 5 cm to 6 cm. She was fitted with a small nasal mask. She had no significant PLMS. I did prescribe CPAP therapy at a pressure of 6 cm based on her test results which were limited.  Today, 06/26/2016 (all dictated new, as well as above notes, some dictation done in note pad or Word, outside of chart, may appear as copied):  I reviewed her CPAP compliance data from 05/26/2016 through 06/24/2016 which is a total of  30 days, during which time she used her machine every night with percent used days greater than 4 hours at 100%, indicating superb compliance with an average usage of 7 hours and 31 minutes, residual AHI borderline at 6 per hour, leak low with the 95th percentile at 1.8 L/m on a pressure of 6 cm with EPR of 2. She reports that she has trouble maintaining sleep. She usually falls asleep okay. She tries to keep a schedule. She works from home. She has no trouble using this new machine but did not dislike her old machine. She admits that she has trouble adapting to something new. Sometimes she wakes up after 6 hours of sleep and cannot go back to sleep. It used to be where she woke up 2 or 3 hours after falling asleep and while this is better she would like to be able to sleep at least 7 hours. She does not like to take medication for sleep. She can drink some milk at times and goes back to sleep after that. She reports no significant change in how she feels. She did not notice a big change in the pressure. She reports that at the time of her second sleep study she had a cough. She then had a upper respiratory infection shortly there after. She feels better in that regard, she has some allergy symptoms which flareup from time to time.  The patient's allergies, current medications, family history, past medical history, past social history, past surgical history and problem list  were reviewed and updated as appropriate.   Previously (copied from previous notes for reference):   11/24/2015: She was previously diagnosed with obstructive sleep apnea years ago and placed on CPAP therapy. Prior sleep study results are not available for my review. She has been on CPAP therapy. She has an older CPAP machine, ResMed S8 Elite, and I reviewed her CPAP compliance data from 05/23/2015 through 11/18/2015 which is a total of 180 days during which time she used her machine every night with percent used days greater than 4 hours  at 99%, indicating superb compliance, with an average usage of 7 hours and 36 minutes, residual AHI borderline at 5.2 per hour, leak acceptable with the 95th percentile at 16.8 L/m on a pressure of 11 cm. She reports daytime somnolence and her Epworth sleepiness score is 14 out of 24 today, her fatigue score is 58 out of 63. She also reports difficulty maintaining sleep especially after she goes to the bathroom at night. I reviewed your office note from 11/16/2015. She reports significant anxiety especially at night and especially in the middle of the night when she cannot go back to sleep. A lot of her anxiety seems to surround her work situation. She says she has always been an anxious person. She is single. She has no children. She generally lives alone in her home but her sister has been staying with her a lot. She has no other siblings. Sister does not have sleep apnea. Patient has been working through her anxiety with relaxation, meditation, drinking chamomile tea which often is a hit or miss. She would not want to consider medication for anxiety she states. She is a nonsmoker, drinks alcohol infrequently, drinks caffeine in the form of coffee, one cup in the morning and generally speaking noncaffeine tea one or 2 cups per day, not much water, maybe up to 3 cups per day. Her bedtime is around 10 PM and while she does not have difficulty falling asleep she will wake up in the early morning hours and usually goes to bathroom once or twice per night on average, sometimes more, and has difficulty going back to sleep. She does take a nap during the day when she is sedentary, typically no planned naps. She denies restless leg symptoms but has noted twitching in her sleep and leg movements but while it used to be significant enough to disheveled her sheets, does not seem to do that any longer. She denies morning headaches. Wake up time is around 6 AM. 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 When she cannot go back to sleep in the middle of the night, she starts using her computer or eye pad and goes on twitch her and starts worrying about politics and her job.  Her Past Medical History Is Significant For: Past Medical History:  Diagnosis Date  . Abnormal perimenopausal bleeding 2005  . Allergy   . Anemia   . Anxiety   . Dysuria 2004  . Endometrial polyp 2006  . Environmental allergies   . Fibroid 02/19/03  . H/O varicella   . History of measles, mumps, or rubella   . Hx of colonic polyps 12/15/2010  . Hypertension 07-19-2010   echo for pre-op EF 55%  normal left wall thickness LA mildly dialated with mitral and tricuspid regurgatation  . Hypothyroidism   . Interstitial cystitis   . Menopausal symptoms 2004  . Obesity   . Osteopenia 02/2012   -  1.8 T score right femur neck  . PMB (postmenopausal bleeding) 06/21/10  . Sleep apnea   . Urinary incontinence 2011  . Vitamin D deficiency    history of    Her Past Surgical History Is Significant For: Past Surgical History:  Procedure Laterality Date  . COLONOSCOPY  12/15/10  . HYSTEROSCOPY  2006 and May 2012   for fibroids, endometrial polyps  . MOUTH SURGERY    . MYOMECTOMY  1974    Her Family History Is Significant For: Family History  Problem Relation Age of Onset  . Kidney disease Father     from uremia  . Benign prostatic hyperplasia Father   . Hypertension Mother   . Colon cancer Paternal Uncle   . Clotting disorder Maternal Grandmother     ????    Her Social History Is Significant For: Social History   Social History  . Marital status: Divorced    Spouse name: n/a  . Number of children: N/A  . Years of education: college   Social History Main Topics  . Smoking status: Never Smoker  . Smokeless tobacco: Never Used  . Alcohol use 0.6 oz/week    1 Glasses of wine per week     Comment: occasional (1-2 times per week)  . Drug use: No  . Sexual activity: No   Other Topics Concern   . None   Social History Narrative   Exercise: Yoga 2 times a week.    Her Allergies Are:  Allergies  Allergen Reactions  . Bactrim [Sulfamethoxazole-Trimethoprim]   . Epinephrine     shakey  . Erythromycin   . Sulfa Antibiotics Rash  :   Her Current Medications Are:  Outpatient Encounter Prescriptions as of 06/26/2016  Medication Sig  . benazepril-hydrochlorthiazide (LOTENSIN HCT) 20-12.5 MG tablet TAKE ONE TABLET BY MOUTH TWICE A DAY FOR BLOOD PRESSURE  . hydrALAZINE (APRESOLINE) 25 MG tablet Take 2 in the morning, 1 at lunch, and 2 at dinner for blood pressure  . levothyroxine (SYNTHROID, LEVOTHROID) 112 MCG tablet TAKE 1 TABLET (112 MCG TOTAL) BY MOUTH DAILY BEFORE BREAKFAST.  Marland Kitchen liothyronine (CYTOMEL) 5 MCG tablet TAKE 1 TABLET BY MOUTH DAILY FOR THYROID   No facility-administered encounter medications on file as of 06/26/2016.   :  Review of Systems:  Out of a complete 14 point review of systems, all are reviewed and negative with the exception of these symptoms as listed below: Review of Systems  Neurological:       Pt presents today to discuss her cpap. Pt thinks that she is not getting enough sleep; only around 6 hours, but she would prefer 7 hours.    Objective:  Neurologic Exam  Physical Exam Physical Examination:   Vitals:   06/26/16 1607  BP: (!) 160/96  Pulse: 80    General Examination: The patient is a very pleasant 71 y.o. female in no acute distress. She appears well-developed and well-nourished and well groomed.   HEENT: Normocephalic, atraumatic, pupils are equal, round and reactive to light and accommodation.she has corrective eyeglasses. Extraocular tracking is good without limitation to gaze excursion or nystagmus noted. Normal smooth pursuit is noted. Hearing is grossly intact. Face is symmetric with normal facial animation and normal facial sensation. Speech is clear with no dysarthria noted. There is no hypophonia. There is no lip, neck/head, jaw  or voice tremor. Neck is supple with full range of passive and active motion. There are no carotid bruits on auscultation. Oropharynx exam reveals: mild  mouth dryness, adequate dental hygiene and moderate airway crowding. Mallampati is class III. Tongue protrudes centrally and palate elevates symmetrically.    Chest: Clear to auscultation without wheezing, rhonchi or crackles noted.  Heart: S1+S2+0, regular and normal without murmurs, rubs or gallops noted.   Abdomen: Soft, non-tender and non-distended with normal bowel sounds appreciated on auscultation.  Extremities: There is trace pitting edema in the  medial aspect of the left ankle. She has no tenderness.   Skin: Warm and dry without trophic changes noted.  Musculoskeletal: exam reveals no obvious joint deformities, tenderness or joint swelling or erythema.   Neurologically:  Mental status: The patient is awake, alert and oriented in all 4 spheres. Her immediate and remote memory, attention, language skills and fund of knowledge are appropriate. There is no evidence of aphasia, agnosia, apraxia or anomia. Speech is clear with normal prosody and enunciation. Thought process is linear. Mood is normal and affect is normal.  Cranial nerves II - XII are as described above under HEENT exam. In addition: shoulder shrug is normal with equal shoulder height noted. Motor exam: Normal bulk, strength and tone is noted. There is no drift, resting tremor. Romberg is negative. Reflexes are 1-2+ throughout. Fine motor skills and coordination: intact.  Cerebellar testing: No dysmetria or intention tremor. There is no truncal or gait ataxia.  Sensory exam: intact to light touch in the upper and lower extremities.  Gait, station and balance: She stands easily. No veering to one side is noted. No leaning to one side is noted. Posture is age-appropriate and stance is narrow based. Gait shows normal stride length and normal pace. No problems turning are noted.    Assessment and Plan:  In summary, PRISCELLA DONNA is a very pleasant 71 y.o.-year old female with an underlying medical history of hypertension, osteoarthritis, hypothyroidism, anxiety, and obesity, who presents for follow-up consultation of her obstructive sleep apnea, on CPAP therapy after her recent sleep studies. Her baseline sleep study from November 2017 confirmed her diagnosis and she had a CPAP titration study in December 2017 with minimal sleep achieved only. I placed her on CPAP of 6 cm at the time. She is compliant with treatment and commended for this. She does report more sleep consolidation but would like to be able to sleep 7 hours if possible as she feels best when she achieves about 7 hours of sleep. She is advised that I would like to increase her CPAP pressure to 8 cm at this time to see if we can get her sleep apnea under more optimal control. She is advised to stay fully compliant with treatment. She is encouraged to try melatonin for sleep which is over-the-counter. Physical exam is stable. We talked about maintaining a healthy lifestyle as well as good sleep hygiene. I suggested a 1 year checkup, she can see one of our nurse practitioners at the time. She is encouraged to call in about 6 weeks so we can look at another 30 day download when her pressure is increased from 6 to 8 cm. I answered all her questions today and she was in agreement. I spent 25 minutes in total face-to-face time with the patient, more than 50% of which was spent in counseling and coordination of care, reviewing test results, reviewing medication and discussing or reviewing the diagnosis of OSA, its prognosis and treatment options. Pertinent laboratory and imaging test results that were available during this visit with the patient were reviewed by me and considered in my  medical decision making (see chart for details).

## 2016-07-06 ENCOUNTER — Other Ambulatory Visit: Payer: Self-pay | Admitting: Physician Assistant

## 2016-07-06 DIAGNOSIS — I10 Essential (primary) hypertension: Secondary | ICD-10-CM

## 2016-08-11 ENCOUNTER — Encounter: Payer: Self-pay | Admitting: Neurology

## 2016-09-01 ENCOUNTER — Encounter: Payer: Self-pay | Admitting: Physician Assistant

## 2016-09-01 ENCOUNTER — Ambulatory Visit (INDEPENDENT_AMBULATORY_CARE_PROVIDER_SITE_OTHER): Payer: Medicare Other | Admitting: Physician Assistant

## 2016-09-01 VITALS — BP 140/80 | HR 55 | Temp 97.7°F | Resp 18 | Ht 62.5 in | Wt 178.6 lb

## 2016-09-01 DIAGNOSIS — R2689 Other abnormalities of gait and mobility: Secondary | ICD-10-CM | POA: Diagnosis not present

## 2016-09-01 DIAGNOSIS — E038 Other specified hypothyroidism: Secondary | ICD-10-CM | POA: Diagnosis not present

## 2016-09-01 DIAGNOSIS — M8580 Other specified disorders of bone density and structure, unspecified site: Secondary | ICD-10-CM | POA: Insufficient documentation

## 2016-09-01 DIAGNOSIS — Z6835 Body mass index (BMI) 35.0-35.9, adult: Secondary | ICD-10-CM | POA: Diagnosis not present

## 2016-09-01 DIAGNOSIS — R32 Unspecified urinary incontinence: Secondary | ICD-10-CM | POA: Diagnosis not present

## 2016-09-01 DIAGNOSIS — F419 Anxiety disorder, unspecified: Secondary | ICD-10-CM | POA: Diagnosis not present

## 2016-09-01 DIAGNOSIS — I10 Essential (primary) hypertension: Secondary | ICD-10-CM

## 2016-09-01 DIAGNOSIS — Z0000 Encounter for general adult medical examination without abnormal findings: Secondary | ICD-10-CM

## 2016-09-01 DIAGNOSIS — E559 Vitamin D deficiency, unspecified: Secondary | ICD-10-CM | POA: Diagnosis not present

## 2016-09-01 LAB — POCT URINALYSIS DIP (MANUAL ENTRY)
Bilirubin, UA: NEGATIVE
Glucose, UA: NEGATIVE mg/dL
Ketones, POC UA: NEGATIVE mg/dL
Leukocytes, UA: NEGATIVE
NITRITE UA: NEGATIVE
PH UA: 6.5 (ref 5.0–8.0)
PROTEIN UA: NEGATIVE mg/dL
RBC UA: NEGATIVE
Spec Grav, UA: 1.015 (ref 1.010–1.025)
UROBILINOGEN UA: 0.2 U/dL

## 2016-09-01 NOTE — Assessment & Plan Note (Signed)
Update level. Consider resumption of supplement.

## 2016-09-01 NOTE — Progress Notes (Signed)
Presents today for [employer] Visit-Subsequent.   Date of last exam: 11/2015, she has checked with her insurance plan and reports that this visit will be covered, despite being less than 12 months since the last.  Interpreter used for this visit? no  Patient Care Team: [name], PA-C as PCP - General (Family Medicine) [name], [name], MD as Consulting Physician (Obstetrics and Gynecology) [name], MD as Attending Physician (Neurology) [name], MD as Consulting Physician (Internal Medicine)   Other items to address today: balance She would like her balance checked due to difficulty with standning on one leg in the [employer] and balance class that she participates in once weekly. She relates that she does better when her eyes are closed. She also relates dizziness when she first lies down at night, and wonders if the problem in the CPAP she uses. She has to turn on the machine while sitting on the edge of the bed in order to reach the button. When the dizziness occurs, she briefly removes the nasal pillows from her nose, the dizziness resolves, and then she replaces them.  She has not taken her BP medications this morning.  Cancer Screening: Cervical: no longer a candidate Breast: normal 02/2015 Colon: polyps 2016. Repeat 2019  Prostate: n/a   Other Screening: Last screening for diabetes: 11/2015, normal Last lipid screening: 11/2014, normal  ADVANCE DIRECTIVES: Discussed: yes On File: no Materials Provided: yes  Immunization status:  Immunization History  Administered Date(s) Administered  . DTaP 03/05/2008  . Pneumococcal Conjugate-13 11/13/2015  . Pneumococcal-Unspecified 06/27/2010  . Tdap 02/12/2015     There are no preventive care reminders to display for this patient.   Home Environment: lives in a single story home with 5 steps. Her sister stays with her regularly.  Functional Status Survey: Is the patient deaf  or have difficulty hearing?: No Does the patient have difficulty seeing, even when wearing glasses/contacts?: No Does the patient have difficulty concentrating, remembering, or making decisions?: Yes (per pt sometimes having concentrating.) Does the patient have difficulty walking or climbing stairs?: Yes (per pt has problem with stairs when she is having knee pain) Does the patient have difficulty dressing or bathing?: No Does the patient have difficulty doing errands alone such as visiting a doctor's office or shopping?: No    Urinary Incontinence Screening: stress and urge incontinence, wears incontinence pads  Patient Active Problem List   Diagnosis Date Noted  . Osteopenia 09/01/2016  . BMI 35.0-35.9,adult 11/16/2015  . Essential hypertension, benign 04/28/2012  . Osteoarthritis of left knee 04/28/2012  . Hypothyroidism 04/28/2012  . Hx of colonic polyps 12/15/2010  . Anxiety 11/17/2010  . Sleep apnea 11/17/2010     Past Medical History:  Diagnosis Date  . Abnormal perimenopausal bleeding 2005  . Allergy   . Anemia   . Anxiety   . Dysuria 2004  . Endometrial polyp 2006  . Environmental allergies   . Fibroid 02/19/03  . H/O varicella   . History of measles, mumps, or rubella   . Hx of colonic polyps 12/15/2010  . Hypertension 07-19-2010   echo for pre-op EF 55%  normal left wall thickness LA mildly dialated with mitral and tricuspid regurgatation  . Hypothyroidism   . Interstitial cystitis   . Menopausal symptoms 2004  . Obesity   . Osteopenia 02/2012   -1.8 T score right femur neck  . PMB (postmenopausal bleeding) 06/21/10  . Sleep apnea   . Urinary  incontinence 2011  . Vitamin D deficiency    history of     Past Surgical History:  Procedure Laterality Date  . COLONOSCOPY  12/15/10  . HYSTEROSCOPY  2006 and May 2012   for fibroids, endometrial polyps  . MOUTH SURGERY    . MYOMECTOMY  1974     Family History  Problem Relation Age of Onset  . Kidney  disease Father        from uremia  . Benign prostatic hyperplasia Father   . Hypertension Mother   . Colon cancer Paternal Uncle   . Clotting disorder Maternal Grandmother        ????     Social History   Social History  . Marital status: Divorced    Spouse name: n/a  . Number of children: N/A  . Years of education: college   Occupational History  . Not on file.   Social History Main Topics  . Smoking status: Never Smoker  . Smokeless tobacco: Never Used  . Alcohol use 0.6 oz/week    1 Glasses of wine per week     Comment: occasional (1-2 times per week)  . Drug use: No  . Sexual activity: No   Other Topics Concern  . Not on file   Social History Narrative   Lives alone.   Sister stays with her frequently.   Participates in [employer] exercise program.     Allergies  Allergen Reactions  . Bactrim [Sulfamethoxazole-Trimethoprim]   . Epinephrine     shakey  . Erythromycin   . Sulfa Antibiotics Rash     Prior to Admission medications   Medication Sig Start Date End Date Taking? Authorizing Provider  acetaminophen (TYLENOL) 325 MG tablet Take 650 mg by mouth every 6 (six) hours as needed for moderate pain (knee pain).   Yes [provider]  benazepril-hydrochlorthiazide (LOTENSIN HCT) 20-12.5 MG tablet TAKE ONE TABLET BY MOUTH TWICE A DAY FOR BLOOD PRESSURE 05/17/16  Yes Duffy Dantonio, PA-C  hydrALAZINE (APRESOLINE) 25 MG tablet TAKE TWO TABLETS BY MOUTH IN THE MORNING, THEN ONE TABLET BY MOUTH AT LUNCH, THEN TWO TABLETS BY MOUTH AT DINNER FOR BLOOD PRESSURE 07/07/16  Yes Yoni Lobos, PA-C  levothyroxine (SYNTHROID, LEVOTHROID) 112 MCG tablet TAKE 1 TABLET (112 MCG TOTAL) BY MOUTH DAILY BEFORE BREAKFAST. 11/12/15  Yes [name], MD  liothyronine (CYTOMEL) 5 MCG tablet TAKE 1 TABLET BY MOUTH DAILY FOR THYROID 02/01/16  Yes [name], MD     Depression screen [facility] 2/9 09/01/2016 03/16/2016 02/19/2016 11/16/2015 12/02/2014  Decreased  Interest 0 0 0 0 0  Down, Depressed, Hopeless 0 0 0 0 0  PHQ - 2 Score 0 0 0 0 0     Fall Risk  09/01/2016 03/16/2016 02/19/2016 11/16/2015 11/16/2015  Falls in the past year? No No No No No  Number falls in past yr: - - - - -      PHYSICAL EXAM: BP (!) 160/96 (BP Location: Left Arm, Patient Position: Sitting, Cuff Size: Normal) Comment: per pt didn't take her BP medication this morning.  Pulse (!) 55   Temp 97.7 F (36.5 C) (Oral)   Resp 18   Ht 5' 2.5" (1.588 m)   Wt 178 lb 9.6 oz (81 kg)   SpO2 98%   BMI 32.15 kg/m    Wt Readings from Last 3 Encounters:  09/01/16 178 lb 9.6 oz (81 kg)  06/26/16 177 lb (80.3 kg)  03/17/16 179 lb (81.2 kg)  Visual Acuity Screening   Right eye Left eye Both eyes  Without correction:     With correction: 20/25 20/20 20/20   Hearing Screening Comments: Pt had difficulty hearing at 10 ft but could hear a little bit better once closer.    Physical Exam  Constitutional: She is oriented to person, place, and time. She appears well-developed and well-nourished. She is active and cooperative. No distress.  HENT:  Head: Normocephalic and atraumatic.  Right Ear: Hearing, tympanic membrane, external ear and ear canal normal.  Left Ear: Hearing, tympanic membrane, external ear and ear canal normal.  Nose: Nose normal.  Mouth/Throat: Uvula is midline, oropharynx is clear and moist and mucous membranes are normal. No oral lesions. No uvula swelling. No oropharyngeal exudate.  Eyes: Conjunctivae, EOM and lids are normal. Pupils are equal, round, and reactive to light. Right eye exhibits no discharge. Left eye exhibits no discharge. No scleral icterus.  Fundoscopic exam:      The right eye shows no hemorrhage and no papilledema. The right eye shows red reflex.       The left eye shows no hemorrhage and no papilledema. The left eye shows red reflex.  Neck: Normal range of motion, full passive range of motion without pain and phonation normal. Neck  supple. No thyromegaly present.  Cardiovascular: Normal rate, regular rhythm, normal heart sounds and intact distal pulses.  Exam reveals no gallop and no friction rub.   No murmur heard. Respiratory: Effort normal and breath sounds normal.  GI: Soft. Normal appearance and bowel sounds are normal. There is no hepatosplenomegaly. There is no tenderness.  Musculoskeletal:       Cervical back: Normal.       Thoracic back: Normal.       Lumbar back: Normal.  Lymphadenopathy:       Head (right side): No submandibular and no tonsillar adenopathy present.       Head (left side): No submandibular and no tonsillar adenopathy present.    She has no cervical adenopathy.       Right: No supraclavicular adenopathy present.       Left: No supraclavicular adenopathy present.  Neurological: She is alert and oriented to person, place, and time. She has normal strength. No cranial nerve deficit or sensory deficit. She displays a negative Romberg sign. Coordination normal.  Skin: Skin is warm and dry. No rash noted. She is not diaphoretic.  Psychiatric: She has a normal mood and affect. Her speech is normal and behavior is normal. Judgment and thought content normal. Cognition and memory are normal.     Education/Counseling provided regarding diet and exercise, prevention of chronic diseases, smoking/tobacco cessation, if applicable, and reviewed "Covered Medicare Preventive Services."   ASSESSMENT/PLAN: Problem List Items Addressed This Visit    Anxiety    This condition is not well controlled, but she isn't interested in treatment. As such, her other issues are more problematic/bothersome.      Essential hypertension, benign    Above goal today. Has not taken her medication. Isn't really sure what it usually runs at home. She will check it and record, and bring the log in for my review at her next visit.      Relevant Orders   Comprehensive metabolic panel   CBC with Differential/Platelet    Hypothyroidism    Update thyroid labs. Adjust regimen vs refer back to endocrinology if needed.      Relevant Orders   TSH   T4, free   BMI  35.0-35.9,adult    Continue exercising, encouraged to increase to 5 days/week.      Urinary incontinence in female    The patient finds this to be a mild, intermittent problem. She is not currently interested in treatment that may present adverse effects.      Relevant Orders   POCT urinalysis dipstick (Completed)   Urine Culture   Vitamin D deficiency    Update level. Consider resumption of supplement.      Relevant Orders   VITAMIN D 25 Hydroxy (Vit-D Deficiency, Fractures)    Other Visit Diagnoses    Medicare annual wellness visit, subsequent    -  Primary   Balance problem       Declines referral to PT due to cost. Continue balance class. Encouraged her to do the exercises at home on the days she doesn't have class.       Return in about 6 months (around 03/03/2017) for re-evaluation of thyroid, blood pressure.   Fara Chute, PA-C [facility]

## 2016-09-01 NOTE — Patient Instructions (Addendum)
Try removing the nasal pillows before you lie down, and then place them back in your nose after you lie down.  Let me know if you decide that you would like to try medication to help you hold your urine.     We recommend that you schedule a mammogram for breast cancer screening. Typically, you do not need a referral to do this. Please contact a local imaging center to schedule your mammogram.  [facility] - [phone]  *ask for the Radiology Department [facility]) - [phone] or [phone]  [facility] - [phone] [place] [phone] [facility] - [phone]  *ask for the [facility] - [phone]  *ask for the Radiology Department [facility] - [phone]  *ask for the [facility] - [phone]     IF you received an x-ray today, you will receive an invoice from [facility]. Please contact [facility] at [phone] with questions or concerns regarding your invoice.   IF you received labwork today, you will receive an invoice from [facility]. Please contact [facility] at [phone] with questions or concerns regarding your invoice.   Our billing staff will not be able to assist you with questions regarding bills from these companies.  You will be contacted with the lab results as soon as they are available. The fastest way to get your results is to activate your My Chart account. Instructions are located on the last page of this paperwork. If you have not heard from [place] regarding the results in 2 weeks, please contact this office.

## 2016-09-01 NOTE — Assessment & Plan Note (Signed)
Above goal today. Has not taken her medication. Isn't really sure what it usually runs at home. She will check it and record, and bring the log in for my review at her next visit.

## 2016-09-01 NOTE — Assessment & Plan Note (Signed)
Continue exercising, encouraged to increase to 5 days/week.

## 2016-09-01 NOTE — Assessment & Plan Note (Addendum)
This condition is not well controlled, but she isn't interested in treatment. As such, her other issues are more problematic/bothersome.

## 2016-09-01 NOTE — Progress Notes (Signed)
Subjective:    Patient ID: [name], female    DOB: [birth year], 71 y.o.   MRN: [id number] PCP: [name], PA-C Patient Care Team: [name], PA-C as PCP - General (Family Medicine) [name] [name], MD as Consulting Physician (Obstetrics and Gynecology)   HPI: 71 y/o F presents today for her subsequent medicare wellness visit.  Overall feeling well. Has knee pain when walking up and down stairs/ squats at gym. Works by scoring standardized tests.  Exercise: goes to silver sneakers classes about 3x weeks; seeing trainer to help with knees.  Diet: has good break fast; tries to incorporate salads, typical dinner is a lean quisine or spaghetti.   Sleep: wakes up to go to bathroom, ranges from 1-3 times, has difficulty falling asleep. Uses meditation app to fall asleep at night. Feels shaky inside if she does not sleep well. Sleeps better on the days that she exercises. Sleeps with CPAP.  Hypertension: Not checking BP at home. Once in a while gets headaches. Once in a while gets dizzy. ENT told her dizzy is from anxiety. No vision changes.   Hypothyroidism: Feels good with levothyroxine and liothyronine. Memory has been good but feeling fatigue. Mild increase in dry skin.   Urinary incontinence: stress incontinence. Urgency when she thinks that she has held it too long. Aware of the leakage that occurs during the day. Wearing pads. Admits to frequency.  Living situation: 5 stairs going into 1 floor house. Sister stays with patient frequently.  Advanced Directives: does not have power of attorney or living will.   Patient Active Problem List   Diagnosis Date Noted  . BMI 35.0-35.9,adult 11/16/2015  . Essential hypertension, benign 04/28/2012  . Osteoarthritis of left knee 04/28/2012  . Hypothyroidism 04/28/2012  . Hx of colonic polyps 12/15/2010  . Anxiety 11/17/2010  . Sleep apnea 11/17/2010   Past Medical History:  Diagnosis Date  . Abnormal perimenopausal  bleeding 2005  . Allergy   . Anemia   . Anxiety   . Dysuria 2004  . Endometrial polyp 2006  . Environmental allergies   . Fibroid 02/19/03  . H/O varicella   . History of measles, mumps, or rubella   . Hx of colonic polyps 12/15/2010  . Hypertension 07-19-2010   echo for pre-op EF 55%  normal left wall thickness LA mildly dialated with mitral and tricuspid regurgatation  . Hypothyroidism   . Interstitial cystitis   . Menopausal symptoms 2004  . Obesity   . Osteopenia 02/2012   -1.8 T score right femur neck  . PMB (postmenopausal bleeding) 06/21/10  . Sleep apnea   . Urinary incontinence 2011  . Vitamin D deficiency    history of   Prior to Admission medications   Medication Sig Start Date End Date Taking? Authorizing Provider  benazepril-hydrochlorthiazide (LOTENSIN HCT) 20-12.5 MG tablet TAKE ONE TABLET BY MOUTH TWICE A DAY FOR BLOOD PRESSURE 05/17/16   Jeffery, Chelle, PA-C  hydrALAZINE (APRESOLINE) 25 MG tablet TAKE TWO TABLETS BY MOUTH IN THE MORNING, THEN ONE TABLET BY MOUTH AT LUNCH, THEN TWO TABLETS BY MOUTH AT DINNER FOR BLOOD PRESSURE 07/07/16   [name], Chelle, PA-C  levothyroxine (SYNTHROID, LEVOTHROID) 112 MCG tablet TAKE 1 TABLET (112 MCG TOTAL) BY MOUTH DAILY BEFORE BREAKFAST. 11/12/15   [name], MD  liothyronine (CYTOMEL) 5 MCG tablet TAKE 1 TABLET BY MOUTH DAILY FOR THYROID 02/01/16   [name], MD   Allergies  Allergen Reactions  . Bactrim [Sulfamethoxazole-Trimethoprim]   .  Epinephrine     shakey  . Erythromycin   . Sulfa Antibiotics Rash     Review of Systems  Constitutional: Positive for fatigue. Negative for chills and fever.  HENT: Positive for congestion, postnasal drip, rhinorrhea, sneezing and sore throat. Negative for ear pain and sinus pressure.   Eyes: Negative for visual disturbance.  Respiratory: Negative.  Negative for cough, chest tightness and shortness of breath.   Cardiovascular: Negative.  Negative for chest pain,  palpitations and leg swelling.  Gastrointestinal: Positive for diarrhea. Negative for abdominal pain, blood in stool, constipation, nausea and vomiting.  Endocrine: Positive for heat intolerance (because it has been very hot recently).  Genitourinary: Positive for frequency, urgency and vaginal discharge. Negative for dysuria and hematuria.  Musculoskeletal: Positive for arthralgias. Negative for back pain and joint swelling.  Skin: Negative.   Neurological: Positive for dizziness, tremors (when she doesnt sleep well), light-headedness and headaches. Negative for numbness.  Hematological: Negative.   Psychiatric/Behavioral: Positive for sleep disturbance. The patient is nervous/anxious.        Objective:   Physical Exam  Constitutional: She is oriented to person, place, and time. She appears well-developed and well-nourished. No distress.  BP (!) 160/96 (BP Location: Left Arm, Patient Position: Sitting, Cuff Size: Normal) Comment: per pt didn't take her BP medication this morning.  Pulse (!) 55   Temp 97.7 F (36.5 C) (Oral)   Resp 18   Ht 5' 2.5" (1.588 m)   Wt 178 lb 9.6 oz (81 kg)   SpO2 98%   BMI 32.15 kg/m    HENT:  Head: Normocephalic and atraumatic.  Right Ear: External ear normal.  Left Ear: External ear normal.  Nose: Nose normal.  Mouth/Throat: Oropharynx is clear and moist.  Eyes: Conjunctivae and EOM are normal. Pupils are equal, round, and reactive to light.  Neck: Normal range of motion. Neck supple. No thyromegaly present.  Cardiovascular: Normal rate, regular rhythm and intact distal pulses.   Pulmonary/Chest: Effort normal and breath sounds normal. No respiratory distress. She has no wheezes.  Abdominal: Soft.  Lymphadenopathy:    She has no cervical adenopathy.  Neurological: She is alert and oriented to person, place, and time. She has normal reflexes.  Skin: Skin is warm and dry. She is not diaphoretic.  Psychiatric: She has a normal mood and affect. Her  behavior is normal. Judgment and thought content normal.       Assessment & Plan:  1. Medicare annual wellness visit, subsequent Discussed Medicare preventive services, advanced directives, living situation and urinary incontinence.   2. Urinary incontinence in female Longstanding problem with urinary leakage and frequency. Patient not interested in medical treatment at this time. R/o UTI.  - POCT urinalysis dipstick - Urine Culture  3. Anxiety Patient continues to be anxious. It manifests as shakiness and dizziness. Multiple positive review of systems likely due to anxiety.   4. Essential hypertension, benign Condition somewhat controlled with Lotensin, and Hydralazine. Patient denies regularly checking blood pressure at home. Advise to keep a log and bring to next visit.  - Comprehensive metabolic panel - CBC with Differential/Platelet  5. Vitamin D deficiency In September of 2016 patient was found to be vitamin D deficient. Not taking Vitamin D at home regularly.  - VITAMIN D 25 Hydroxy (Vit-D Deficiency, Fractures)  6. Other specified hypothyroidism Condition well controlled on Levothyroxine and Liothyronine. Continue taking medications as prescribed.  - TSH - T4, free  7. BMI 35.0-35.9,adult Discusses adding exercise classes and  improving diet for weight loss.   Return in about 6 months (around 03/03/2017) for re-evaluation of thyroid, blood pressure.

## 2016-09-01 NOTE — Assessment & Plan Note (Signed)
Update thyroid labs. Adjust regimen vs refer back to endocrinology if needed.

## 2016-09-01 NOTE — Assessment & Plan Note (Signed)
The patient finds this to be a mild, intermittent problem. She is not currently interested in treatment that may present adverse effects.

## 2016-09-02 LAB — COMPREHENSIVE METABOLIC PANEL
ALK PHOS: 59 IU/L (ref 39–117)
ALT: 15 IU/L (ref 0–32)
AST: 14 IU/L (ref 0–40)
Albumin/Globulin Ratio: 2 (ref 1.2–2.2)
Albumin: 4.3 g/dL (ref 3.5–4.8)
BUN/Creatinine Ratio: 20 (ref 12–28)
BUN: 14 mg/dL (ref 8–27)
Bilirubin Total: 0.6 mg/dL (ref 0.0–1.2)
CO2: 26 mmol/L (ref 20–29)
CREATININE: 0.71 mg/dL (ref 0.57–1.00)
Calcium: 9.1 mg/dL (ref 8.7–10.3)
Chloride: 101 mmol/L (ref 96–106)
GFR calc Af Amer: 98 mL/min/{1.73_m2} (ref >59)
GFR calc non Af Amer: 85 mL/min/{1.73_m2} (ref >59)
Globulin, Total: 2.2 g/dL (ref 1.5–4.5)
Glucose: 88 mg/dL (ref 65–99)
Potassium: 4.2 mmol/L (ref 3.5–5.2)
SODIUM: 142 mmol/L (ref 134–144)
Total Protein: 6.5 g/dL (ref 6.0–8.5)

## 2016-09-02 LAB — TSH: TSH: 1.23 u[IU]/mL (ref 0.450–4.500)

## 2016-09-02 LAB — CBC WITH DIFFERENTIAL/PLATELET
Basophils Absolute: 0 10*3/uL (ref 0.0–0.2)
Basos: 0 %
EOS (ABSOLUTE): 0.2 10*3/uL (ref 0.0–0.4)
EOS: 3 %
HEMATOCRIT: 39.7 % (ref 34.0–46.6)
Hemoglobin: 13.3 g/dL (ref 11.1–15.9)
IMMATURE GRANULOCYTES: 0 %
Immature Grans (Abs): 0 10*3/uL (ref 0.0–0.1)
Lymphocytes Absolute: 2.4 10*3/uL (ref 0.7–3.1)
Lymphs: 32 %
MCH: 27.4 pg (ref 26.6–33.0)
MCHC: 33.5 g/dL (ref 31.5–35.7)
MCV: 82 fL (ref 79–97)
MONOCYTES: 6 %
MONOS ABS: 0.4 10*3/uL (ref 0.1–0.9)
NEUTROS PCT: 59 %
Neutrophils Absolute: 4.3 10*3/uL (ref 1.4–7.0)
Platelets: 231 10*3/uL (ref 150–379)
RBC: 4.86 x10E6/uL (ref 3.77–5.28)
RDW: 15.4 % (ref 12.3–15.4)
WBC: 7.3 10*3/uL (ref 3.4–10.8)

## 2016-09-02 LAB — T4, FREE: FREE T4: 1.75 ng/dL (ref 0.82–1.77)

## 2016-09-02 LAB — VITAMIN D 25 HYDROXY (VIT D DEFICIENCY, FRACTURES): Vit D, 25-Hydroxy: 25 ng/mL — ABNORMAL LOW (ref 30.0–100.0)

## 2016-09-02 LAB — URINE CULTURE: ORGANISM ID, BACTERIA: NO GROWTH

## 2016-09-06 ENCOUNTER — Other Ambulatory Visit: Payer: Self-pay | Admitting: Physician Assistant

## 2016-09-06 DIAGNOSIS — I10 Essential (primary) hypertension: Secondary | ICD-10-CM

## 2016-09-07 MED ORDER — HYDRALAZINE HCL 25 MG PO TABS: ORAL_TABLET | ORAL | 1 refills | Status: DC

## 2016-09-07 NOTE — Telephone Encounter (Signed)
Meds ordered this encounter  Medications  . benazepril-hydrochlorthiazide (LOTENSIN HCT) 20-12.5 MG tablet    Sig: TAKE ONE TABLET BY MOUTH TWICE A DAY FOR FOR BLOOD PRESSURE    Dispense:  180 tablet    Refill:  1  . hydrALAZINE (APRESOLINE) 25 MG tablet    Sig: TAKE TWO TABLETS BY MOUTH IN THE MORNING, THEN ONE TABLET BY MOUTH AT LUNCH, THEN TWO TABLETS BY MOUTH AT DINNER FOR BLOOD PRESSURE    Dispense:  450 tablet    Refill:  1    Order Specific Question:   Supervising Provider    Answer:   Shawnee Knapp [[id number]]    She has refills at the pharmacy from Dr. Cruzita Lederer on both the levothyroxine and liothyronine through 01/2017.

## 2016-09-07 NOTE — Telephone Encounter (Signed)
Pt made aware BP medication refilled and sent to the pharmacy

## 2016-09-07 NOTE — Telephone Encounter (Signed)
PATIENT STATES SHE IS ABOUT OUT OF HER BENAZEPRIL-HYDROCHLORTHIAZIDE 20-12.5 MG. SHE ALSO NEEDS HYDRAZINE 25 MG. SHE WOULD LIKE CHELLE TO CALL IN HER LIOTHYRONINE 5 MCG AND LEVOTHYROXINE 112 MCG THAT DR. [name] USUALLY PRESCRIBES HER TO HAVE. SHE SAID CHELLE CHECKED HER THYROID LEVELS AND EVERYTHING WAS STABLE. THIS WOULD SAVE HER A VISIT TO SEE HER ENDOCRINOLOGIST. BEST PHONE [phone] (CELL) PHARMACY CHOICE IS [facility] [place]

## 2016-09-27 ENCOUNTER — Telehealth: Payer: Self-pay | Admitting: Internal Medicine

## 2016-09-27 NOTE — Telephone Encounter (Signed)
Pt is checking to see which provider will be refilling cytomel and levothroxine [place] or dr Cruzita Lederer

## 2016-09-27 NOTE — Telephone Encounter (Signed)
Pt would like to get refills on cytomel and levothyroxine. She had her labs done recently by her PCP. She also rescheduled her appointment to 01/04/17

## 2016-09-28 ENCOUNTER — Other Ambulatory Visit: Payer: Self-pay

## 2016-09-28 MED ORDER — LEVOTHYROXINE SODIUM 112 MCG PO TABS: 112.0000 ug | ORAL_TABLET | Freq: Every day | ORAL | 1 refills | Status: DC

## 2016-09-28 MED ORDER — LIOTHYRONINE SODIUM 5 MCG PO TABS: ORAL_TABLET | ORAL | 1 refills | Status: DC

## 2016-09-28 NOTE — Telephone Encounter (Signed)
Submitted to pharmacy on file

## 2016-09-28 NOTE — Telephone Encounter (Signed)
Ok to refill since new labs normal. She has appt in 01/2017. Can send 3 mo with 1 refill.

## 2016-09-28 NOTE — Telephone Encounter (Signed)
Okay to refill these medications 

## 2016-09-28 NOTE — Telephone Encounter (Signed)
See below

## 2016-10-11 ENCOUNTER — Ambulatory Visit: Payer: Medicare Other | Admitting: Internal Medicine

## 2017-01-04 ENCOUNTER — Ambulatory Visit (INDEPENDENT_AMBULATORY_CARE_PROVIDER_SITE_OTHER): Payer: Medicare Other | Admitting: Internal Medicine

## 2017-01-04 ENCOUNTER — Encounter: Payer: Self-pay | Admitting: Internal Medicine

## 2017-01-04 VITALS — BP 128/84 | HR 68 | Wt 181.0 lb

## 2017-01-04 DIAGNOSIS — E039 Hypothyroidism, unspecified: Secondary | ICD-10-CM

## 2017-01-04 MED ORDER — LIOTHYRONINE SODIUM 5 MCG PO TABS: ORAL_TABLET | ORAL | 3 refills | Status: DC

## 2017-01-04 MED ORDER — LEVOTHYROXINE SODIUM 112 MCG PO TABS: 112.0000 ug | ORAL_TABLET | Freq: Every day | ORAL | 3 refills | Status: DC

## 2017-01-04 NOTE — Patient Instructions (Addendum)
Please continue: - Levothyroxine 112 mcg daily - Cytomel 5 mg daily  Take the thyroid hormone every day, with water, at least 30 minutes before breakfast, separated by at least 4 hours from: - acid reflux medications - calcium - iron - multivitamins  Please come back for a follow-up appointment in 1 year, but have thyroid checked at your visit with PCP in June.

## 2017-01-04 NOTE — Progress Notes (Signed)
Patient ID: [name], female   DOB: 1945/08/07, 70 y.o.   MRN: [id number]   HPI  [name] is a 71 y.o.-year-old female, returning for f/u for hypothyroidism. Last visit 1 year ago.  She started exercise for her knee. Has a Physiological scientist) Pain is better.  Pt. has been dx with hypothyroidism "decades ago"; is on Levothyroxine 112 mcg (increased in 02/2015 from 100 mcg LT4) + Cytomel 5 mg (total dose equivalent of 132 mcg LT4 daily), taken: - in am - fasting - >60 min from b'fast - no Ca, Fe, MVI, PPIs - not on Biotin  I reviewed pt's thyroid tests - normal 4 mo ago: Lab Results  Component Value Date   TSH 1.230 09/01/2016   TSH 1.75 11/13/2015   TSH 1.34 04/14/2015   TSH 3.549 02/12/2015   TSH 5.529 (H) 12/02/2014   TSH 2.161 05/30/2014   TSH 1.906 08/18/2013   TSH 2.830 04/30/2013   TSH 1.762 10/05/2012   TSH 0.698 08/16/2012   FREET4 1.75 09/01/2016   FREET4 1.6 11/13/2015   FREET4 1.37 04/14/2015   FREET4 1.71 04/30/2013   FREET4 1.45 10/05/2012   FREET4 1.39 08/16/2012   She has poor memory even when the TSH is at the ULN. She feels much better after the last increase in dose in 02/2015. She would like to do anything to avoid loss of memory and would like to keep her TFTs where they are now.  The dose of Cytomel was decreased few years ago as she was not sleeping well. She still has pbs sleeping as she wakes up to go to the bathroom and cannot sleep afterwards.   Pt denies: - feeling nodules in neck - hoarseness - dysphagia - choking - SOB with lying down  She has no FH of thyroid disorders. No FH of thyroid cancer. No h/o radiation tx to head or neck.  No seaweed or kelp. No recent contrast studies. No herbal supplements. No Biotin use. No recent steroids use.   Off Carvedilol.  ROS: Constitutional: no weight gain/no weight loss, no fatigue, no subjective hyperthermia, no subjective hypothermia Eyes: no blurry vision, no xerophthalmia ENT: no  sore throat,+ see HPI Cardiovascular: no CP/no SOB/no palpitations/no leg swelling Respiratory: + cough/no SOB/no wheezing Gastrointestinal: no N/no V/no D/no C/no acid reflux Musculoskeletal: no muscle aches/+ joint aches Skin: no rashes, no hair loss Neurological: no tremors/no numbness/no tingling/no dizziness  I reviewed pt's medications, allergies, PMH, social hx, family hx, and changes were documented in the history of present illness. Otherwise, unchanged from my initial visit note.   Past Medical History:  Diagnosis Date  . Abnormal perimenopausal bleeding 2005  . Allergy   . Anemia   . Anxiety   . Dysuria 2004  . Endometrial polyp 2006  . Environmental allergies   . Fibroid 02/19/03  . H/O varicella   . History of measles, mumps, or rubella   . Hx of colonic polyps 12/15/2010  . Hypertension 07-19-2010   echo for pre-op EF 55%  normal left wall thickness LA mildly dialated with mitral and tricuspid regurgatation  . Hypothyroidism   . Interstitial cystitis   . Menopausal symptoms 2004  . Obesity   . Osteopenia 02/2012   -1.8 T score right femur neck  . PMB (postmenopausal bleeding) 06/21/10  . Sleep apnea   . Urinary incontinence 2011  . Vitamin D deficiency    history of   Past Surgical History:  Procedure Laterality Date  .  COLONOSCOPY  12/15/10  . HYSTEROSCOPY  2006 and May 2012   for fibroids, endometrial polyps  . MOUTH SURGERY    . MYOMECTOMY  1974   Social History   Social History  . Marital Status: Divorced    Spouse Name: N/A  . Number of Children: 0   Occupational History  . 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 Smoking status: Never Smoker   . Smokeless tobacco: Never Used  . Alcohol Use: 0.6 oz/week    1 Glasses of wine per week     Comment: occasional (1-2 times per week)  . Drug Use: No   Social History Narrative   Divorced. Education: [employer]. Exercise: Yoga 2 times a week.   Current  Outpatient Prescriptions on File Prior to Visit  Medication Sig Dispense Refill  . benazepril-hydrochlorthiazide (LOTENSIN HCT) 20-12.5 MG tablet TAKE ONE TABLET BY MOUTH TWICE A DAY FOR FOR BLOOD PRESSURE 180 tablet 1  . hydrALAZINE (APRESOLINE) 25 MG tablet TAKE TWO TABLETS BY MOUTH IN THE MORNING, THEN ONE TABLET BY MOUTH AT LUNCH, THEN TWO TABLETS BY MOUTH AT DINNER FOR BLOOD PRESSURE 450 tablet 1  . levothyroxine (SYNTHROID, LEVOTHROID) 112 MCG tablet Take 1 tablet (112 mcg total) by mouth daily before breakfast. 90 tablet 1  . liothyronine (CYTOMEL) 5 MCG tablet TAKE 1 TABLET BY MOUTH DAILY FOR THYROID 90 tablet 1  . acetaminophen (TYLENOL) 325 MG tablet Take 650 mg by mouth every 6 (six) hours as needed for moderate pain (knee pain).     No current facility-administered medications on file prior to visit.    Allergies  Allergen Reactions  . Bactrim [Sulfamethoxazole-Trimethoprim]   . Epinephrine     shakey  . Erythromycin   . Sulfa Antibiotics Rash   Family History  Problem Relation Age of Onset  . Kidney disease Father        from uremia  . Benign prostatic hyperplasia Father   . Hypertension Mother   . Colon cancer Paternal Uncle   . Clotting disorder Maternal Grandmother        ????   PE: BP 128/84 (BP Location: Left Arm, Patient Position: Sitting)   Pulse 68   Wt 181 lb (82.1 kg)   SpO2 98%   BMI 32.58 kg/m  Wt Readings from Last 3 Encounters:  01/04/17 181 lb (82.1 kg)  09/01/16 178 lb 9.6 oz (81 kg)  06/26/16 177 lb (80.3 kg)   Constitutional: overweight, in NAD Eyes: PERRLA, EOMI, no exophthalmos ENT: moist mucous membranes, no thyromegaly, no cervical lymphadenopathy Cardiovascular: RRR, No MRG Respiratory: CTA B Gastrointestinal: abdomen soft, NT, ND, BS+ Musculoskeletal: no deformities, strength intact in all 4 Skin: moist, warm, no rashes Neurological: no tremor with outstretched hands, DTR normal in all 4  ASSESSMENT: 1. Hypothyroidism  PLAN:   1. Patient with long-standing hypothyroidism, on LT4 + LT3 therapy. She appears euthyroid but c/o memory loss when TSH heads towards the ULN.  - latest thyroid labs reviewed with pt >> normal 4 mo ago - she continues on LT4 112 mcg and LT3 5 mcg daily   - pt feels good on this dose. - we discussed about taking the thyroid hormone every day, with water, >30 minutes before breakfast, separated by >4 hours from acid reflux medications, calcium, iron, multivitamins. Pt. is taking it correctly - will refill her Rx's - will not check her TFTs today but she will have them checked at next  visit with PCP ([facility]) in 08/2016 per her preference - RTC in 1 year  [name], MD PhD [facility]

## 2017-01-15 ENCOUNTER — Telehealth: Payer: Self-pay | Admitting: Family Medicine

## 2017-01-15 ENCOUNTER — Telehealth: Payer: Self-pay | Admitting: Physician Assistant

## 2017-01-15 NOTE — Telephone Encounter (Signed)
Copied from [place] [id number]. Topic: Referral - Request >> Jan 15, 2017  8:12 AM Yvette Rack wrote: Reason for CRM:  patient is calling for a referral to an ortho for left knee and ankle pain she is using a cane her pain level is a 10 she also would like a handicap sticker because it hurts to walk and put pressure on knee She would also like for it to happen sometime this week best number to reach is home number unless someone call her today she'll be on her cell phone this evening  >> Jan 15, 2017  9:59 AM Neva Seat wrote: [facility] is where pt want referral sent to.  Wants a Handicap Placard - requesting it for at least 6 months -  cannot stand or walk far.  >> Jan 15, 2017 10:09 AM Neva Seat wrote: Pt wants a call back to let her know if the office has the forms for a Handicap Placard.  She will be able to take a return call by 12:30pm today.  Taking sister to doctor visit after 12:30. >> Jan 15, 2017  3:36 PM Ahmed Prima L wrote: Patient is wanting a call back and wants to know if she can get this paper signed for a handicap sticker.

## 2017-01-15 NOTE — Telephone Encounter (Signed)
patient is calling for a referral to an ortho for left knee and ankle pain she is using a cane.. her pain level is a 10 .Marland Kitchen Per [place].Marland Kitchen  PLEASE ADVISE

## 2017-01-16 NOTE — Telephone Encounter (Signed)
Lm to return call.   See previous message

## 2017-01-16 NOTE — Telephone Encounter (Signed)
Please advise this patient to schedule visit with me or Dr. Carlota Raspberry regarding knee pain. If appropriate, referral to orthopedics can be made at that time.

## 2017-01-17 NOTE — Telephone Encounter (Signed)
Pt called back and stated she would wait a few days to see if it feels better. Pt states icing it has helped and shes still having some pain but not nearly as much pain. Pt will call back in a few days to make an appt with Dr. Carlota Raspberry if knee is still bothering her.

## 2017-01-18 ENCOUNTER — Encounter: Payer: Self-pay | Admitting: Family Medicine

## 2017-01-18 ENCOUNTER — Ambulatory Visit (INDEPENDENT_AMBULATORY_CARE_PROVIDER_SITE_OTHER): Payer: Medicare Other

## 2017-01-18 ENCOUNTER — Ambulatory Visit: Payer: Medicare Other | Admitting: Family Medicine

## 2017-01-18 VITALS — BP 164/82 | HR 82 | Temp 97.7°F | Resp 16 | Ht 62.5 in | Wt 179.0 lb

## 2017-01-18 DIAGNOSIS — R937 Abnormal findings on diagnostic imaging of other parts of musculoskeletal system: Secondary | ICD-10-CM

## 2017-01-18 DIAGNOSIS — M25562 Pain in left knee: Secondary | ICD-10-CM

## 2017-01-18 NOTE — Patient Instructions (Addendum)
I will refer you to orthopaedics for knee pain and possible abnormal area of fibula. In the meantime can use brace and 4 pronged cane as needed. Tylenol up to 4 times per day. Elevate leg when seated. If needed, can take over the counter ibuprofen up to 600mg  every 8 hours, but would recommend against using this long term. Also stop that medicine if it is elevating your blood pressure.   Return to the clinic or go to the nearest emergency room if any of your symptoms worsen or new symptoms occur.  Knee Pain, Adult Knee pain in adults is common. It can be caused by many things, including:  Arthritis.  A fluid-filled sac (cyst) or growth in your knee.  An infection in your knee.  An injury that will not heal.  Damage, swelling, or irritation of the tissues that support your knee.  Knee pain is usually not a sign of a serious problem. The pain may go away on its own with time and rest. If it does not, a health care provider may order tests to find the cause of the pain. These may include:  Imaging tests, such as an X-ray, MRI, or ultrasound.  Joint aspiration. In this test, fluid is removed from the knee.  Arthroscopy. In this test, a lighted tube is inserted into knee and an image is projected onto a TV screen.  A biopsy. In this test, a sample of tissue is removed from the body and studied under a microscope.  Follow these instructions at home: Pay attention to any changes in your symptoms. Take these actions to relieve your pain. Activity  Rest your knee.  Do not do things that cause pain or make pain worse.  Avoid high-impact activities or exercises, such as running, jumping rope, or doing jumping jacks. General instructions  Take over-the-counter and prescription medicines only as told by your health care provider.  Raise (elevate) your knee above the level of your heart when you are sitting or lying down.  Sleep with a pillow under your knee.  If directed, apply ice  to the knee: ? Put ice in a plastic bag. ? Place a towel between your skin and the bag. ? Leave the ice on for 20 minutes, 2-3 times a day.  Ask your health care provider if you should wear an elastic knee support.  Lose weight if you are overweight. Extra weight can put pressure on your knee.  Do not use any products that contain nicotine or tobacco, such as cigarettes and e-cigarettes. Smoking may slow the healing of any bone and joint problems that you may have. If you need help quitting, ask your health care provider. Contact a health care provider if:  Your knee pain continues, changes, or gets worse.  You have a fever along with knee pain.  Your knee buckles or locks up.  Your knee swells, and the swelling becomes worse. Get help right away if:  Your knee feels warm to the touch.  You cannot move your knee.  You have severe pain in your knee.  You have chest pain.  You have trouble breathing. Summary  Knee pain in adults is common. It can be caused by many things, including, arthritis, infection, cysts, or injury.  Knee pain is usually not a sign of a serious problem, but if it does not go away, a health care provider may perform tests to know the cause of the pain.  Pay attention to any changes in your symptoms.  Relieve your pain with rest, medicines, light activity, and use of ice.  Get help if your pain continues or becomes very severe, or if your knee buckles or locks up, or if you have chest pain or trouble breathing. This information is not intended to replace advice given to you by your health care provider. Make sure you discuss any questions you have with your health care provider. Document Released: 12/18/2006 Document Revised: 02/11/2016 Document Reviewed: 02/11/2016 Elsevier Interactive Patient Education  2018 [employer].     IF you received an x-ray today, you will receive an invoice from [facility]. Please contact [facility] at  [phone] with questions or concerns regarding your invoice.   IF you received labwork today, you will receive an invoice from [place]. Please contact [facility] at [phone] with questions or concerns regarding your invoice.   Our billing staff will not be able to assist you with questions regarding bills from these companies.  You will be contacted with the lab results as soon as they are available. The fastest way to get your results is to activate your My Chart account. Instructions are located on the last page of this paperwork. If you have not heard from [place] regarding the results in 2 weeks, please contact this office.

## 2017-01-18 NOTE — Progress Notes (Signed)
Subjective:  By signing my name below, I, [name], attest that this documentation has been prepared under the direction and in the presence of Joy Agreste, MD Electronically Signed: Ladene [name], ED Scribe 01/18/2017 at 11:31 AM.   Patient ID: [name], female    DOB: 10/07/1945, 71 y.o.   MRN: [id number]  Chief Complaint  Patient presents with  . Knee Pain    L knee x 1 week; patient states that she sees personal trainer, has more pain when walking stairs  . Cerumen Impaction    does not want lavage   HPI [name] is a 71 y.o. female who presents to Primary Care at [facility] complaining of L knee pain x 1 week. PCP: [name], PA-C. She does have an appointment scheduled with her PCP on 12/21. Pt does not recall fall or injury. She does report doing house work for several hours 1 week ago when she twisted her L foot and felt sudden onset of sharp, shooting pain from the medial and lateral aspects of the L knee into the ankle. Reports twisting her L foot at least 2 additional times that day while doing house work, recreating the pain. Pain is exacerbated with bending and extending the knee. She has been taking Tylenol every 8 hours for several days but only took Tylenol once yesterday and once today as well.  Patient Active Problem List   Diagnosis Date Noted  . Osteopenia 09/01/2016  . Urinary incontinence in female 09/01/2016  . Vitamin D deficiency 09/01/2016  . BMI 35.0-35.9,adult 11/16/2015  . Essential hypertension, benign 04/28/2012  . Osteoarthritis of left knee 04/28/2012  . Hypothyroidism 04/28/2012  . Hx of colonic polyps 12/15/2010  . Anxiety 11/17/2010  . Sleep apnea 11/17/2010   Past Medical History:  Diagnosis Date  . Abnormal perimenopausal bleeding 2005  . Allergy   . Anemia   . Anxiety   . Dysuria 2004  . Endometrial polyp 2006  . Environmental allergies   . Fibroid 02/19/03  . H/O varicella   . History of measles,  mumps, or rubella   . Hx of colonic polyps 12/15/2010  . Hypertension 07-19-2010   echo for pre-op EF 55%  normal left wall thickness LA mildly dialated with mitral and tricuspid regurgatation  . Hypothyroidism   . Interstitial cystitis   . Menopausal symptoms 2004  . Obesity   . Osteopenia 02/2012   -1.8 T score right femur neck  . PMB (postmenopausal bleeding) 06/21/10  . Sleep apnea   . Urinary incontinence 2011  . Vitamin D deficiency    history of   Past Surgical History:  Procedure Laterality Date  . COLONOSCOPY  12/15/10  . HYSTEROSCOPY  2006 and May 2012   for fibroids, endometrial polyps  . MOUTH SURGERY    . MYOMECTOMY  1974   Allergies  Allergen Reactions  . Bactrim [Sulfamethoxazole-Trimethoprim]   . Epinephrine     shakey  . Erythromycin   . Sulfa Antibiotics Rash   Prior to Admission medications   Medication Sig Start Date End Date Taking? Authorizing Provider  acetaminophen (TYLENOL) 325 MG tablet Take 650 mg by mouth every 6 (six) hours as needed for moderate pain (knee pain).    [provider]  benazepril-hydrochlorthiazide (LOTENSIN HCT) 20-12.5 MG tablet TAKE ONE TABLET BY MOUTH TWICE A DAY FOR FOR BLOOD PRESSURE 09/07/16   [name], [place], PA-C  hydrALAZINE (APRESOLINE) 25 MG tablet TAKE TWO TABLETS BY MOUTH IN THE  MORNING, THEN ONE TABLET BY MOUTH AT LUNCH, THEN TWO TABLETS BY MOUTH AT DINNER FOR BLOOD PRESSURE 09/07/16   [name], PA-C  levothyroxine (SYNTHROID, LEVOTHROID) 112 MCG tablet Take 1 tablet (112 mcg total) by mouth daily before breakfast. 01/04/17   [name], MD  liothyronine (CYTOMEL) 5 MCG tablet TAKE 1 TABLET BY MOUTH DAILY FOR THYROID 01/04/17   [name], MD   Social History   Socioeconomic History  . Marital status: Divorced    Spouse name: n/a  . Number of children: Not on file  . Years of education: college  . Highest education level: Not on file  Social Needs  . Financial resource strain: Not on file   . Food insecurity - worry: Not on file  . Food insecurity - inability: Not on file  . Transportation needs - medical: Not on file  . Transportation needs - non-medical: Not on file  Occupational History  . Not on file  Tobacco Use  . Smoking status: Never Smoker  . Smokeless tobacco: Never Used  Substance and Sexual Activity  . Alcohol use: Yes    Alcohol/week: 0.6 oz    Types: 1 Glasses of wine per week    Comment: occasional (1-2 times per week)  . Drug use: No  . Sexual activity: No    Birth control/protection: Abstinence  Other Topics Concern  . Not on file  Social History Narrative   Lives alone.   Sister stays with her frequently.   Participates in [employer] exercise program.   Review of Systems  Musculoskeletal: Positive for arthralgias.      Objective:   Physical Exam  Constitutional: She is oriented to person, place, and time. She appears well-developed and well-nourished. No distress.  HENT:  Head: Normocephalic and atraumatic.  Eyes: Conjunctivae and EOM are normal.  Neck: Neck supple. No tracheal deviation present.  Cardiovascular: Normal rate.  Pulses:      Dorsalis pedis pulses are 2+ on the left side.  Pulmonary/Chest: Effort normal. No respiratory distress.  Musculoskeletal: Normal range of motion.  L ankle: skin is intact. Pain free ROM. No swelling. No bony tenderness.  L foot: no bony tenderness.  Distal tibia, she complains of shooting pain to the lateral knee. Local tenderness along that area. Some diffuse tenderness along the proximal medial tibia. She has some diffuse tenderness along the proximal tibia. 1+ edema into LE bilaterally.  L knee: skin is intact. No erythema. Difficult to access with body habitus but no effusion. Minimal tenderness along medial joint line and lateral joint line. Full extension but complains of pain from lateral posterior thigh to upper calf. Held to 80 degrees of flexion. Unable to describes where it hurts "it just  hurts". Negative varus, negative valgus. Complains of posterior pain with McMurray testing. Cap refill less than 1 second at toes.  Neurological: She is alert and oriented to person, place, and time.  Skin: Skin is warm and dry.  Psychiatric: She has a normal mood and affect. Her behavior is normal.  Nursing note and vitals reviewed.  Vitals:   01/18/17 1111  BP: (!) 164/82  Pulse: 82  Resp: 16  Temp: 97.7 F (36.5 C)  TempSrc: Oral  SpO2: 97%  Weight: 179 lb (81.2 kg)  Height: 5' 2.5" (1.588 m)   Dg Tibia/fibula Left  Result Date: 01/18/2017 CLINICAL DATA:  Twisting injury of the knee and leg 4 days ago with tenderness over the fibular head and diffusely over the tibia.  EXAM: LEFT TIBIA AND FIBULA - 2 VIEW FINDINGS: The left tibia and fibula are subjectively adequately mineralized. There is subtle lucency over the junction of the middle and distal thirds of the left fibular shaft exhibiting endosteal scalloping. Subtle heterogeneous mineralization is noted elsewhere in the mid and lower fibular shaft. There is no acute or healing fracture. There is no periosteal reaction. There may be mild soft tissue swelling in the upper and mid pretibial regions. Specific attention to the fibular head is normal. The knee exhibits mild degenerative change and has been described separately. The observed portions of the ankle are normal. IMPRESSION: There is no acute fracture or dislocation of the left tibia or fibula. Possible soft tissue swelling in the upper and mid pretibial regions. There is a subtle area of lucency with endosteal scalloping at the junction of the middle and distal thirds of the left fibula. This could reflect primary or metastatic malignancy. Correlation with the patient's clinical history and laboratory values is needed. A metastatic skeletal survey may be useful in an effort to exclude myeloma or other malignancy. Electronically Signed   By: David  Martinique M.D.   On: 01/18/2017 12:37    Dg Knee Complete 4 Views Left  Result Date: 01/18/2017 CLINICAL DATA:  Left knee and lower leg pain following a twisting injury at home. The patient reports being touch tender over the proximal fibula as well as the lateral and posterior aspects of the knee. EXAM: LEFT KNEE - COMPLETE 4+ VIEW COMPARISON:  None in PACs FINDINGS: The bones are subjectively adequately mineralized. There is minimal narrowing of the medial joint compartment. There is beaking of the tibial spines. Spurs arise from the articular margins of the patella. There is a small suprapatellar effusion. There is no acute fracture or dislocation. Specific attention to the proximal fibula reveals no acute abnormality. IMPRESSION: Mild osteoarthritic spurring of the tibial spines and patella and minimal narrowing of the medial joint compartment space. There is a small suprapatellar effusion. Electronically Signed   By: David  Martinique M.D.   On: 01/18/2017 12:33      Assessment & Plan:   RANEEN JAFFER is a 71 y.o. female Arthralgia of left lower leg - Plan: DG Knee Complete 4 Views Left, DG Tibia/Fibula Left, Ambulatory referral to Orthopedic Surgery  Acute pain of left knee - Plan: DG Knee Complete 4 Views Left, DG Tibia/Fibula Left, Ambulatory referral to Orthopedic Surgery, Apply knee sleeve  Abnormal bone xray  Acute onset of lower leg pain with radiation from tib-fib through knee to thigh. Knee without apparent acute fracture, but mid fibula with suspicious area as above, with need for likely further imaging for clarification.. She does have some tenderness along the affected area on repeat exam, but also diffusely tender across the tibia and into the knee.  -She was provided a hinged knee brace, currently using a 4 pronged cane for ambulation.  -Ibuprofen 600 mg every 8 hours when necessary, elevate legs when seated. Short-term use of ibuprofen discussed with cardiac risks. Let me know if stronger medication  needed.  -Refer to orthopedics to evaluate area further and decide on next step in imaging. Results of x-ray and potential causes were discussed, all questions answered.  No orders of the defined types were placed in this encounter.  Patient Instructions    I will refer you to orthopaedics for knee pain and possible abnormal area of fibula. In the meantime can use brace and 4 pronged cane as needed. Tylenol  up to 4 times per day. Elevate leg when seated. If needed, can take over the counter ibuprofen up to 600mg  every 8 hours, but would recommend against using this long term. Also stop that medicine if it is elevating your blood pressure.   Return to the clinic or go to the nearest emergency room if any of your symptoms worsen or new symptoms occur.  Knee Pain, Adult Knee pain in adults is common. It can be caused by many things, including:  Arthritis.  A fluid-filled sac (cyst) or growth in your knee.  An infection in your knee.  An injury that will not heal.  Damage, swelling, or irritation of the tissues that support your knee.  Knee pain is usually not a sign of a serious problem. The pain may go away on its own with time and rest. If it does not, a health care provider may order tests to find the cause of the pain. These may include:  Imaging tests, such as an X-ray, MRI, or ultrasound.  Joint aspiration. In this test, fluid is removed from the knee.  Arthroscopy. In this test, a lighted tube is inserted into knee and an image is projected onto a TV screen.  A biopsy. In this test, a sample of tissue is removed from the body and studied under a microscope.  Follow these instructions at home: Pay attention to any changes in your symptoms. Take these actions to relieve your pain. Activity  Rest your knee.  Do not do things that cause pain or make pain worse.  Avoid high-impact activities or exercises, such as running, jumping rope, or doing jumping jacks. General  instructions  Take over-the-counter and prescription medicines only as told by your health care provider.  Raise (elevate) your knee above the level of your heart when you are sitting or lying down.  Sleep with a pillow under your knee.  If directed, apply ice to the knee: ? Put ice in a plastic bag. ? Place a towel between your skin and the bag. ? Leave the ice on for 20 minutes, 2-3 times a day.  Ask your health care provider if you should wear an elastic knee support.  Lose weight if you are overweight. Extra weight can put pressure on your knee.  Do not use any products that contain nicotine or tobacco, such as cigarettes and e-cigarettes. Smoking may slow the healing of any bone and joint problems that you may have. If you need help quitting, ask your health care provider. Contact a health care provider if:  Your knee pain continues, changes, or gets worse.  You have a fever along with knee pain.  Your knee buckles or locks up.  Your knee swells, and the swelling becomes worse. Get help right away if:  Your knee feels warm to the touch.  You cannot move your knee.  You have severe pain in your knee.  You have chest pain.  You have trouble breathing. Summary  Knee pain in adults is common. It can be caused by many things, including, arthritis, infection, cysts, or injury.  Knee pain is usually not a sign of a serious problem, but if it does not go away, a health care provider may perform tests to know the cause of the pain.  Pay attention to any changes in your symptoms. Relieve your pain with rest, medicines, light activity, and use of ice.  Get help if your pain continues or becomes very severe, or if your knee buckles  or locks up, or if you have chest pain or trouble breathing. This information is not intended to replace advice given to you by your health care provider. Make sure you discuss any questions you have with your health care provider. Document  Released: 12/18/2006 Document Revised: 02/11/2016 Document Reviewed: 02/11/2016 Elsevier Interactive Patient Education  2018 [employer].     IF you received an x-ray today, you will receive an invoice from [facility]. Please contact [name] at [phone] with questions or concerns regarding your invoice.   IF you received labwork today, you will receive an invoice from [place] [facility]. Please contact [facility] at [phone] with questions or concerns regarding your invoice.   Our billing staff will not be able to assist you with questions regarding bills from these companies.  You will be contacted with the lab results as soon as they are available. The fastest way to get your results is to activate your My Chart account. Instructions are located on the last page of this paperwork. If you have not heard from [place] regarding the results in 2 weeks, please contact this office.      I personally performed the services described in this documentation, which was scribed in my presence. The recorded information has been reviewed and considered for accuracy and completeness, addended by me as needed, and agree with information above.  Signed,   Merri Ray, MD [facility].  01/20/17 12:18 PM

## 2017-01-19 ENCOUNTER — Telehealth: Payer: Self-pay | Admitting: Family Medicine

## 2017-01-19 NOTE — Telephone Encounter (Unsigned)
Copied from [place] [phone]. >> Jan 19, 2017  5:33 PM Neva Seat wrote: Pt needs the report sent/faxed to [facility] that she saw  Dr. Carlota Raspberry so she can be seen on Tuesday for her appt.  Please call pt to let her know this was sent/fax....  ASAP

## 2017-01-20 ENCOUNTER — Encounter: Payer: Self-pay | Admitting: Family Medicine

## 2017-01-23 NOTE — Telephone Encounter (Signed)
See below

## 2017-02-07 ENCOUNTER — Ambulatory Visit: Payer: Medicare Other | Admitting: Emergency Medicine

## 2017-02-07 ENCOUNTER — Encounter: Payer: Self-pay | Admitting: Emergency Medicine

## 2017-02-07 ENCOUNTER — Other Ambulatory Visit: Payer: Self-pay

## 2017-02-07 VITALS — BP 152/70 | HR 74 | Temp 98.0°F | Resp 16 | Ht 60.25 in | Wt 180.8 lb

## 2017-02-07 DIAGNOSIS — L989 Disorder of the skin and subcutaneous tissue, unspecified: Secondary | ICD-10-CM

## 2017-02-07 DIAGNOSIS — I10 Essential (primary) hypertension: Secondary | ICD-10-CM | POA: Diagnosis not present

## 2017-02-07 MED ORDER — HYDRALAZINE HCL 25 MG PO TABS: ORAL_TABLET | ORAL | 3 refills | Status: DC

## 2017-02-07 MED ORDER — BENAZEPRIL-HYDROCHLOROTHIAZIDE 20-12.5 MG PO TABS: ORAL_TABLET | ORAL | 3 refills | Status: DC

## 2017-02-07 NOTE — Patient Instructions (Addendum)
   IF you received an x-ray today, you will receive an invoice from [facility]. Please contact [facility] at [phone] with questions or concerns regarding your invoice.   IF you received labwork today, you will receive an invoice from [facility]. Please contact [facility] at [phone] with questions or concerns regarding your invoice.   Our billing staff will not be able to assist you with questions regarding bills from these companies.  You will be contacted with the lab results as soon as they are available. The fastest way to get your results is to activate your My Chart account. Instructions are located on the last page of this paperwork. If you have not heard from us regarding the results in 2 weeks, please contact this office.     Hypertension Hypertension, commonly called high blood pressure, is when the force of blood pumping through the arteries is too strong. The arteries are the blood vessels that carry blood from the heart throughout the body. Hypertension forces the heart to work harder to pump blood and may cause arteries to become narrow or stiff. Having untreated or uncontrolled hypertension can cause heart attacks, strokes, kidney disease, and other problems. A blood pressure reading consists of a higher number over a lower number. Ideally, your blood pressure should be below 120/80. The first ("top") number is called the systolic pressure. It is a measure of the pressure in your arteries as your heart beats. The second ("bottom") number is called the diastolic pressure. It is a measure of the pressure in your arteries as the heart relaxes. What are the causes? The cause of this condition is not known. What increases the risk? Some risk factors for high blood pressure are under your control. Others are not. Factors you can change  Smoking.  Having type 2 diabetes mellitus, high cholesterol, or both.  Not getting enough exercise or physical  activity.  Being overweight.  Having too much fat, sugar, calories, or salt (sodium) in your diet.  Drinking too much alcohol. Factors that are difficult or impossible to change  Having chronic kidney disease.  Having a family history of high blood pressure.  Age. Risk increases with age.  Race. You may be at higher risk if you are African-American.  Gender. Men are at higher risk than women before age 45. After age 65, women are at higher risk than men.  Having obstructive sleep apnea.  Stress. What are the signs or symptoms? Extremely high blood pressure (hypertensive crisis) may cause:  Headache.  Anxiety.  Shortness of breath.  Nosebleed.  Nausea and vomiting.  Severe chest pain.  Jerky movements you cannot control (seizures).  How is this diagnosed? This condition is diagnosed by measuring your blood pressure while you are seated, with your arm resting on a surface. The cuff of the blood pressure monitor will be placed directly against the skin of your upper arm at the level of your heart. It should be measured at least twice using the same arm. Certain conditions can cause a difference in blood pressure between your right and left arms. Certain factors can cause blood pressure readings to be lower or higher than normal (elevated) for a short period of time:  When your blood pressure is higher when you are in a health care provider's office than when you are at home, this is called white coat hypertension. Most people with this condition do not need medicines.  When your blood pressure is higher at home than when you   are in a health care provider's office, this is called masked hypertension. Most people with this condition may need medicines to control blood pressure.  If you have a high blood pressure reading during one visit or you have normal blood pressure with other risk factors:  You may be asked to return on a different day to have your blood pressure  checked again.  You may be asked to monitor your blood pressure at home for 1 week or longer.  If you are diagnosed with hypertension, you may have other blood or imaging tests to help your health care provider understand your overall risk for other conditions. How is this treated? This condition is treated by making healthy lifestyle changes, such as eating healthy foods, exercising more, and reducing your alcohol intake. Your health care provider may prescribe medicine if lifestyle changes are not enough to get your blood pressure under control, and if:  Your systolic blood pressure is above 130.  Your diastolic blood pressure is above 80.  Your personal target blood pressure may vary depending on your medical conditions, your age, and other factors. Follow these instructions at home: Eating and drinking  Eat a diet that is high in fiber and potassium, and low in sodium, added sugar, and fat. An example eating plan is called the DASH (Dietary Approaches to Stop Hypertension) diet. To eat this way: ? Eat plenty of fresh fruits and vegetables. Try to fill half of your plate at each meal with fruits and vegetables. ? Eat whole grains, such as whole wheat pasta, brown rice, or whole grain bread. Fill about one quarter of your plate with whole grains. ? Eat or drink low-fat dairy products, such as skim milk or low-fat yogurt. ? Avoid fatty cuts of meat, processed or cured meats, and poultry with skin. Fill about one quarter of your plate with lean proteins, such as fish, chicken without skin, beans, eggs, and tofu. ? Avoid premade and processed foods. These tend to be higher in sodium, added sugar, and fat.  Reduce your daily sodium intake. Most people with hypertension should eat less than 1,500 mg of sodium a day.  Limit alcohol intake to no more than 1 drink a day for nonpregnant women and 2 drinks a day for men. One drink equals 12 oz of beer, 5 oz of wine, or 1 oz of hard  liquor. Lifestyle  Work with your health care provider to maintain a healthy body weight or to lose weight. Ask what an ideal weight is for you.  Get at least 30 minutes of exercise that causes your heart to beat faster (aerobic exercise) most days of the week. Activities may include walking, swimming, or biking.  Include exercise to strengthen your muscles (resistance exercise), such as pilates or lifting weights, as part of your weekly exercise routine. Try to do these types of exercises for 30 minutes at least 3 days a week.  Do not use any products that contain nicotine or tobacco, such as cigarettes and e-cigarettes. If you need help quitting, ask your health care provider.  Monitor your blood pressure at home as told by your health care provider.  Keep all follow-up visits as told by your health care provider. This is important. Medicines  Take over-the-counter and prescription medicines only as told by your health care provider. Follow directions carefully. Blood pressure medicines must be taken as prescribed.  Do not skip doses of blood pressure medicine. Doing this puts you at risk for problems and   can make the medicine less effective.  Ask your health care provider about side effects or reactions to medicines that you should watch for. Contact a health care provider if:  You think you are having a reaction to a medicine you are taking.  You have headaches that keep coming back (recurring).  You feel dizzy.  You have swelling in your ankles.  You have trouble with your vision. Get help right away if:  You develop a severe headache or confusion.  You have unusual weakness or numbness.  You feel faint.  You have severe pain in your chest or abdomen.  You vomit repeatedly.  You have trouble breathing. Summary  Hypertension is when the force of blood pumping through your arteries is too strong. If this condition is not controlled, it may put you at risk for serious  complications.  Your personal target blood pressure may vary depending on your medical conditions, your age, and other factors. For most people, a normal blood pressure is less than 120/80.  Hypertension is treated with lifestyle changes, medicines, or a combination of both. Lifestyle changes include weight loss, eating a healthy, low-sodium diet, exercising more, and limiting alcohol. This information is not intended to replace advice given to you by your health care provider. Make sure you discuss any questions you have with your health care provider. Document Released: 02/20/2005 Document Revised: 01/19/2016 Document Reviewed: 01/19/2016 Elsevier Interactive Patient Education  2018 [employer]  

## 2017-02-07 NOTE — Progress Notes (Signed)
[name] 71 y.o.   Chief Complaint  Patient presents with  . Hypertension    per patient she was at PT and blood pressure was elevated 02/07/17    HISTORY OF PRESENT ILLNESS: This is a 71 y.o. female complaining of high BP this am at PT; also has small facial skin lesion. Better now. Asymptomatic.  HPI   Prior to Admission medications   Medication Sig Start Date End Date Taking? Authorizing Provider  acetaminophen (TYLENOL) 325 MG tablet Take 650 mg by mouth every 6 (six) hours as needed for moderate pain (knee pain).   Yes [provider]  benazepril-hydrochlorthiazide (LOTENSIN HCT) 20-12.5 MG tablet TAKE ONE TABLET BY MOUTH TWICE A DAY FOR FOR BLOOD PRESSURE 09/07/16  Yes Jeffery, Chelle, PA-C  hydrALAZINE (APRESOLINE) 25 MG tablet TAKE TWO TABLETS BY MOUTH IN THE MORNING, THEN ONE TABLET BY MOUTH AT LUNCH, THEN TWO TABLETS BY MOUTH AT DINNER FOR BLOOD PRESSURE 09/07/16  Yes Jeffery, Chelle, PA-C  levothyroxine (SYNTHROID, LEVOTHROID) 112 MCG tablet Take 1 tablet (112 mcg total) by mouth daily before breakfast. 01/04/17  Yes [name], MD  liothyronine (CYTOMEL) 5 MCG tablet TAKE 1 TABLET BY MOUTH DAILY FOR THYROID 01/04/17  Yes [name], MD    Allergies  Allergen Reactions  . Bactrim [Sulfamethoxazole-Trimethoprim]   . Epinephrine     shakey  . Erythromycin   . Sulfa Antibiotics Rash    Patient Active Problem List   Diagnosis Date Noted  . Osteopenia 09/01/2016  . Urinary incontinence in female 09/01/2016  . Vitamin D deficiency 09/01/2016  . BMI 35.0-35.9,adult 11/16/2015  . Essential hypertension, benign 04/28/2012  . Osteoarthritis of left knee 04/28/2012  . Hypothyroidism 04/28/2012  . Hx of colonic polyps 12/15/2010  . Anxiety 11/17/2010  . Sleep apnea 11/17/2010    Past Medical History:  Diagnosis Date  . Abnormal perimenopausal bleeding 2005  . Allergy   . Anemia   . Anxiety   . Dysuria 2004  . Endometrial polyp 2006  .  Environmental allergies   . Fibroid 02/19/03  . H/O varicella   . History of measles, mumps, or rubella   . Hx of colonic polyps 12/15/2010  . Hypertension 07-19-2010   echo for pre-op EF 55%  normal left wall thickness LA mildly dialated with mitral and tricuspid regurgatation  . Hypothyroidism   . Interstitial cystitis   . Menopausal symptoms 2004  . Obesity   . Osteopenia 02/2012   -1.8 T score right femur neck  . PMB (postmenopausal bleeding) 06/21/10  . Sleep apnea   . Urinary incontinence 2011  . Vitamin D deficiency    history of    Past Surgical History:  Procedure Laterality Date  . COLONOSCOPY  12/15/10  . HYSTEROSCOPY  2006 and May 2012   for fibroids, endometrial polyps  . MOUTH SURGERY    . MYOMECTOMY  1974    Social History   Socioeconomic History  . Marital status: Divorced    Spouse name: n/a  . Number of children: Not on file  . Years of education: college  . Highest education level: Not on file  Social Needs  . Financial resource strain: Not on file  . Food insecurity - worry: Not on file  . Food insecurity - inability: Not on file  . Transportation needs - medical: Not on file  . Transportation needs - non-medical: Not on file  Occupational History  . Not on file  Tobacco Use  . Smoking  status: Never Smoker  . Smokeless tobacco: Never Used  Substance and Sexual Activity  . Alcohol use: Yes    Alcohol/week: 0.6 oz    Types: 1 Glasses of wine per week    Comment: occasional (1-2 times per week)  . Drug use: No  . Sexual activity: No    Birth control/protection: Abstinence  Other Topics Concern  . Not on file  Social History Narrative   Lives alone.   Sister stays with her frequently.   Participates in [employer] exercise program.    Family History  Problem Relation Age of Onset  . Kidney disease Father        from uremia  . Benign prostatic hyperplasia Father   . Hypertension Mother   . Colon cancer Paternal Uncle   . Clotting  disorder Maternal Grandmother        ????     Review of Systems  Constitutional: Negative.  Negative for chills and fever.  HENT: Negative.  Negative for sore throat.   Eyes: Negative.  Negative for blurred vision.  Respiratory: Negative.  Negative for cough and shortness of breath.   Cardiovascular: Negative.  Negative for chest pain and palpitations.  Gastrointestinal: Negative for abdominal pain, nausea and vomiting.  Genitourinary: Negative.   Musculoskeletal: Negative.  Negative for back pain, myalgias and neck pain.  Skin:       Facial skin lesions   Neurological: Negative.  Negative for dizziness, sensory change, focal weakness, loss of consciousness and headaches.  Endo/Heme/Allergies: Negative.   All other systems reviewed and are negative.     Vitals:   02/07/17 1507  BP: (!) 152/70  Pulse: 74  Resp: 16  Temp: 98 F (36.7 C)  SpO2: 97%    Physical Exam  Constitutional: She is oriented to person, place, and time. She appears well-developed and well-nourished.  HENT:  Head: Normocephalic and atraumatic.  Mouth/Throat: Oropharynx is clear and moist.  Eyes: Conjunctivae and EOM are normal. Pupils are equal, round, and reactive to light.  Neck: Normal range of motion. Neck supple. No JVD present.  Cardiovascular: Normal rate, regular rhythm, normal heart sounds and intact distal pulses.  Pulmonary/Chest: Effort normal and breath sounds normal.  Abdominal: Soft. There is no tenderness.  Musculoskeletal: Normal range of motion. She exhibits no edema or tenderness.  Lymphadenopathy:    She has no cervical adenopathy.  Neurological: She is alert and oriented to person, place, and time. No cranial nerve deficit or sensory deficit. She exhibits normal muscle tone. Coordination normal.  Skin: Skin is warm and dry. Capillary refill takes less than 2 seconds.  Several facial skin lesions.  Psychiatric: She has a normal mood and affect. Her behavior is normal.  Vitals  reviewed.    ASSESSMENT & PLAN: [name] was seen today for hypertension.  Diagnoses and all orders for this visit:  Essential hypertension, benign -     benazepril-hydrochlorthiazide (LOTENSIN HCT) 20-12.5 MG tablet; TAKE ONE TABLET BY MOUTH TWICE A DAY FOR FOR BLOOD PRESSURE -     hydrALAZINE (APRESOLINE) 25 MG tablet; TAKE TWO TABLETS BY MOUTH IN THE MORNING, THEN ONE TABLET BY MOUTH AT LUNCH, THEN TWO TABLETS BY MOUTH AT DINNER FOR BLOOD PRESSURE  Skin lesions Comments: facial   Will follow up with her Dermatologist.   Patient Instructions       IF you received an x-ray today, you will receive an invoice from [facility]. Please contact [facility] at [phone] with questions or concerns  regarding your invoice.   IF you received labwork today, you will receive an invoice from [facility]. Please contact [facility] at [phone] with questions or concerns regarding your invoice.   Our billing staff will not be able to assist you with questions regarding bills from these companies.  You will be contacted with the lab results as soon as they are available. The fastest way to get your results is to activate your My Chart account. Instructions are located on the last page of this paperwork. If you have not heard from [place] regarding the results in 2 weeks, please contact this office.     Hypertension Hypertension, commonly called high blood pressure, is when the force of blood pumping through the arteries is too strong. The arteries are the blood vessels that carry blood from the heart throughout the body. Hypertension forces the heart to work harder to pump blood and may cause arteries to become narrow or stiff. Having untreated or uncontrolled hypertension can cause heart attacks, strokes, kidney disease, and other problems. A blood pressure reading consists of a higher number over a lower number. Ideally, your blood pressure should be below 120/80. The first  ("top") number is called the systolic pressure. It is a measure of the pressure in your arteries as your heart beats. The second ("bottom") number is called the diastolic pressure. It is a measure of the pressure in your arteries as the heart relaxes. What are the causes? The cause of this condition is not known. What increases the risk? Some risk factors for high blood pressure are under your control. Others are not. Factors you can change  Smoking.  Having type 2 diabetes mellitus, high cholesterol, or both.  Not getting enough exercise or physical activity.  Being overweight.  Having too much fat, sugar, calories, or salt (sodium) in your diet.  Drinking too much alcohol. Factors that are difficult or impossible to change  Having chronic kidney disease.  Having a family history of high blood pressure.  Age. Risk increases with age.  Race. You may be at higher risk if you are African-American.  Gender. Men are at higher risk than women before age 20. After age 4, women are at higher risk than men.  Having obstructive sleep apnea.  Stress. What are the signs or symptoms? Extremely high blood pressure (hypertensive crisis) may cause:  Headache.  Anxiety.  Shortness of breath.  Nosebleed.  Nausea and vomiting.  Severe chest pain.  Jerky movements you cannot control (seizures).  How is this diagnosed? This condition is diagnosed by measuring your blood pressure while you are seated, with your arm resting on a surface. The cuff of the blood pressure monitor will be placed directly against the skin of your upper arm at the level of your heart. It should be measured at least twice using the same arm. Certain conditions can cause a difference in blood pressure between your right and left arms. Certain factors can cause blood pressure readings to be lower or higher than normal (elevated) for a short period of time:  When your blood pressure is higher when you are in a  health care provider's office than when you are at home, this is called white coat hypertension. Most people with this condition do not need medicines.  When your blood pressure is higher at home than when you are in a health care provider's office, this is called masked hypertension. Most people with this condition may need medicines to control blood pressure.  If  you have a high blood pressure reading during one visit or you have normal blood pressure with other risk factors:  You may be asked to return on a different day to have your blood pressure checked again.  You may be asked to monitor your blood pressure at home for 1 week or longer.  If you are diagnosed with hypertension, you may have other blood or imaging tests to help your health care provider understand your overall risk for other conditions. How is this treated? This condition is treated by making healthy lifestyle changes, such as eating healthy foods, exercising more, and reducing your alcohol intake. Your health care provider may prescribe medicine if lifestyle changes are not enough to get your blood pressure under control, and if:  Your systolic blood pressure is above 130.  Your diastolic blood pressure is above 80.  Your personal target blood pressure may vary depending on your medical conditions, your age, and other factors. Follow these instructions at home: Eating and drinking  Eat a diet that is high in fiber and potassium, and low in sodium, added sugar, and fat. An example eating plan is called the DASH (Dietary Approaches to Stop Hypertension) diet. To eat this way: ? Eat plenty of fresh fruits and vegetables. Try to fill half of your plate at each meal with fruits and vegetables. ? Eat whole grains, such as whole wheat pasta, brown rice, or whole grain bread. Fill about one quarter of your plate with whole grains. ? Eat or drink low-fat dairy products, such as skim milk or low-fat yogurt. ? Avoid fatty cuts  of meat, processed or cured meats, and poultry with skin. Fill about one quarter of your plate with lean proteins, such as fish, chicken without skin, beans, eggs, and tofu. ? Avoid premade and processed foods. These tend to be higher in sodium, added sugar, and fat.  Reduce your daily sodium intake. Most people with hypertension should eat less than 1,500 mg of sodium a day.  Limit alcohol intake to no more than 1 drink a day for nonpregnant women and 2 drinks a day for men. One drink equals 12 oz of beer, 5 oz of wine, or 1 oz of hard liquor. Lifestyle  Work with your health care provider to maintain a healthy body weight or to lose weight. Ask what an ideal weight is for you.  Get at least 30 minutes of exercise that causes your heart to beat faster (aerobic exercise) most days of the week. Activities may include walking, swimming, or biking.  Include exercise to strengthen your muscles (resistance exercise), such as pilates or lifting weights, as part of your weekly exercise routine. Try to do these types of exercises for 30 minutes at least 3 days a week.  Do not use any products that contain nicotine or tobacco, such as cigarettes and e-cigarettes. If you need help quitting, ask your health care provider.  Monitor your blood pressure at home as told by your health care provider.  Keep all follow-up visits as told by your health care provider. This is important. Medicines  Take over-the-counter and prescription medicines only as told by your health care provider. Follow directions carefully. Blood pressure medicines must be taken as prescribed.  Do not skip doses of blood pressure medicine. Doing this puts you at risk for problems and can make the medicine less effective.  Ask your health care provider about side effects or reactions to medicines that you should watch for. Contact a health  care provider if:  You think you are having a reaction to a medicine you are taking.  You  have headaches that keep coming back (recurring).  You feel dizzy.  You have swelling in your ankles.  You have trouble with your vision. Get help right away if:  You develop a severe headache or confusion.  You have unusual weakness or numbness.  You feel faint.  You have severe pain in your chest or abdomen.  You vomit repeatedly.  You have trouble breathing. Summary  Hypertension is when the force of blood pumping through your arteries is too strong. If this condition is not controlled, it may put you at risk for serious complications.  Your personal target blood pressure may vary depending on your medical conditions, your age, and other factors. For most people, a normal blood pressure is less than 120/80.  Hypertension is treated with lifestyle changes, medicines, or a combination of both. Lifestyle changes include weight loss, eating a healthy, low-sodium diet, exercising more, and limiting alcohol. This information is not intended to replace advice given to you by your health care provider. Make sure you discuss any questions you have with your health care provider. Document Released: 02/20/2005 Document Revised: 01/19/2016 Document Reviewed: 01/19/2016 Elsevier Interactive Patient Education  2018 Elsevier Inc.      Agustina Caroli, MD [facility]

## 2017-02-20 IMAGING — CT CT HEAD W/O CM
4 series · 17 of 47 positions shown, 19 images · non-contrast
Comparison: 03/24/2010

CLINICAL DATA: Dizziness, hypertension

EXAM:
CT HEAD WITHOUT CONTRAST
TECHNIQUE: Contiguous axial images were obtained from the base of the skull
through the vertex without intravenous contrast.

[Series 2: head without · axial · non-contrast · 0.42mm/px · z∈[+547,+662]mm · 7 of 31 slices shown, 9 images]
[im 4/31  brain]
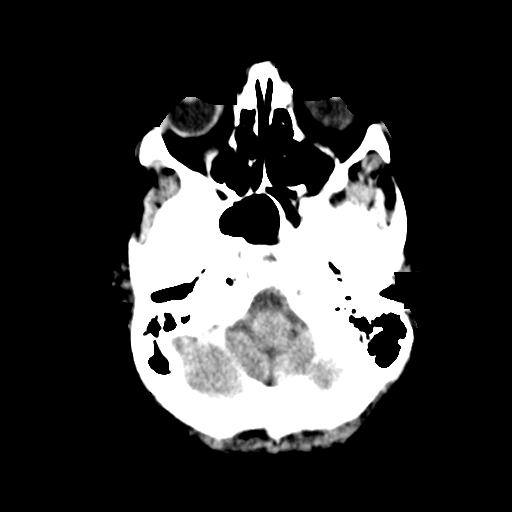
[im 4/31  bone]
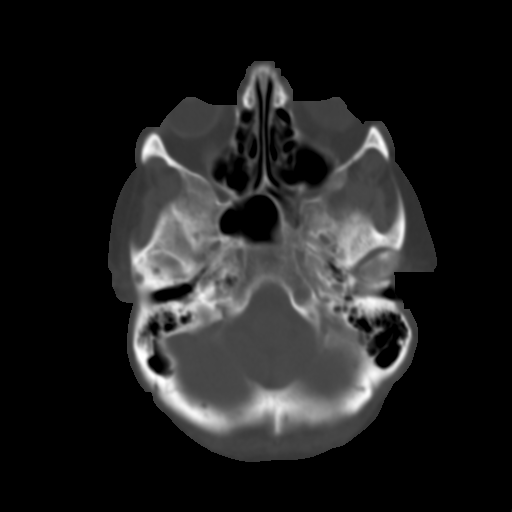
[im 8/31  brain]
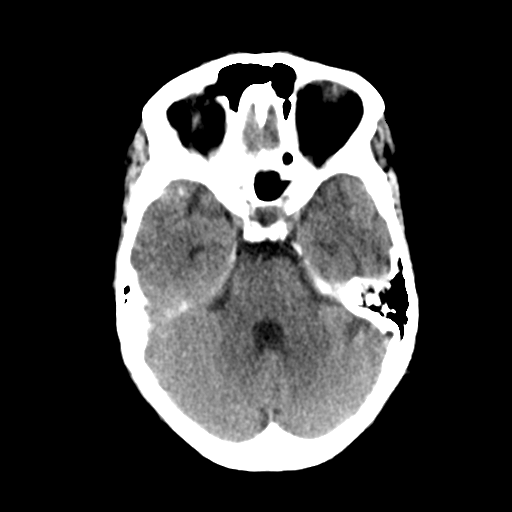
[im 12/31  brain]
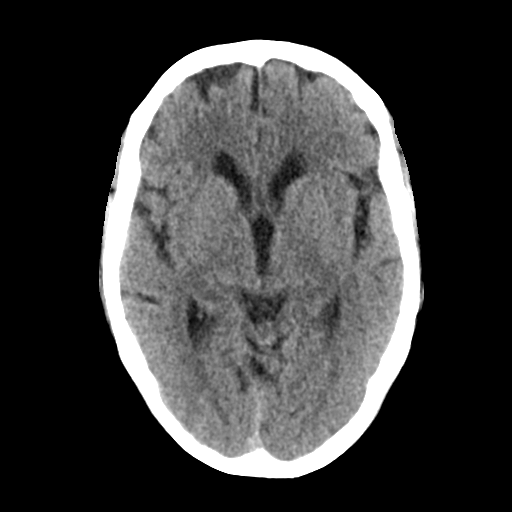
[im 16/31  brain]
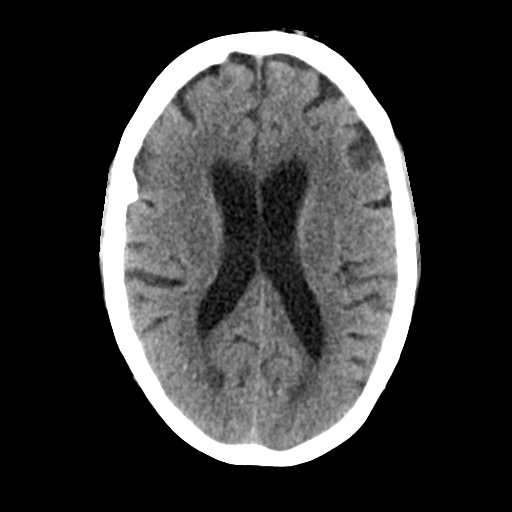
[im 19/31  brain]
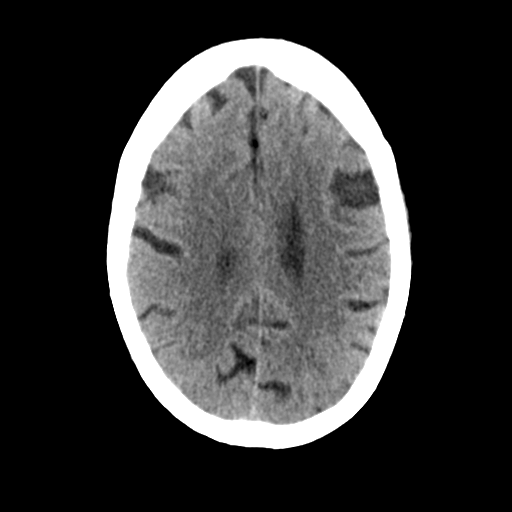
[im 19/31  bone]
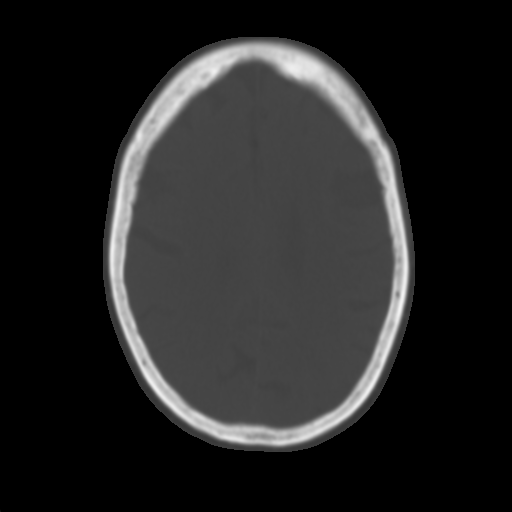
[im 23/31  brain]
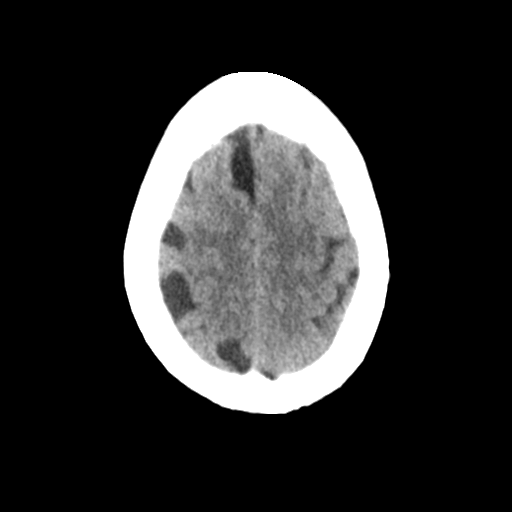
[im 27/31  brain]
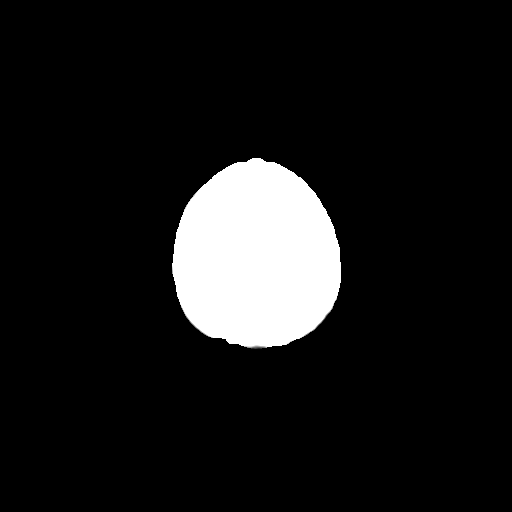

[Series 3: head bone · axial · 0.42mm/px · z∈[+546,+598]mm · 4 of 76 slices shown]
[im 8/76  bone]
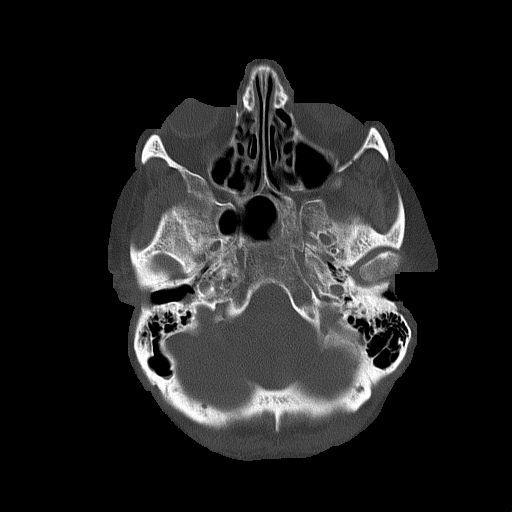
[im 16/76  bone]
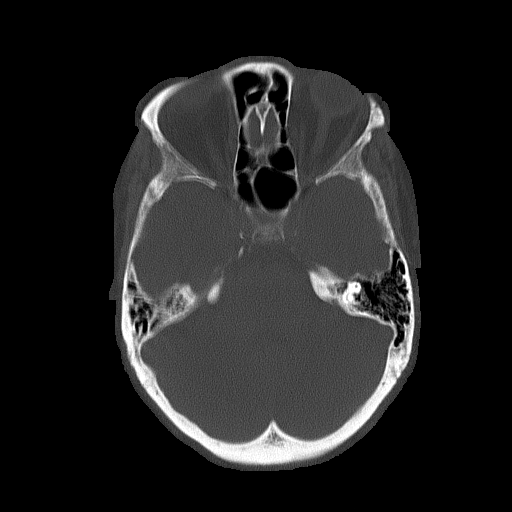
[im 23/76  bone]
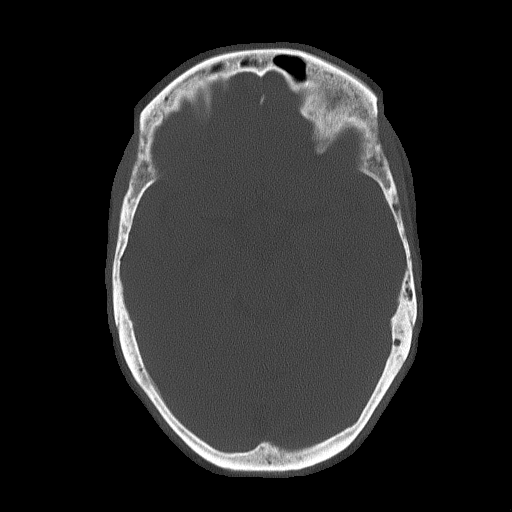
[im 34/76  bone]
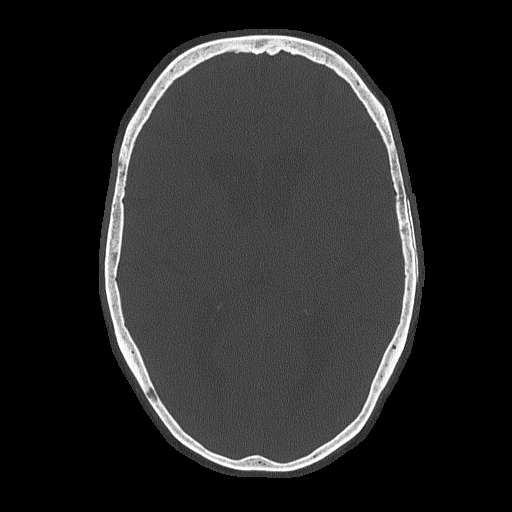

[Series 4: head without cor · coronal · non-contrast · 0.31mm/px · 3 of 68 slices shown]
[im 23/68  brain]
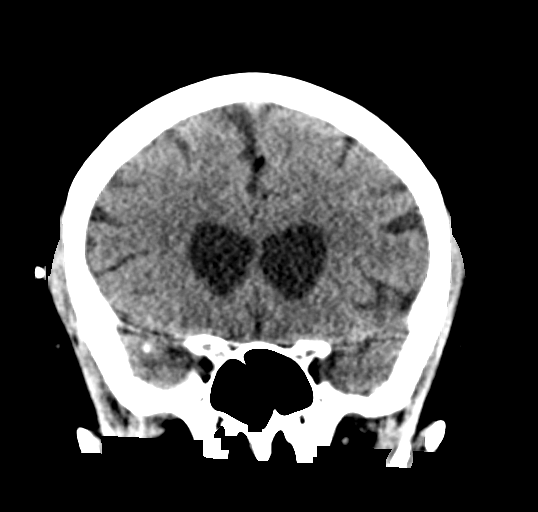
[im 30/68  brain]
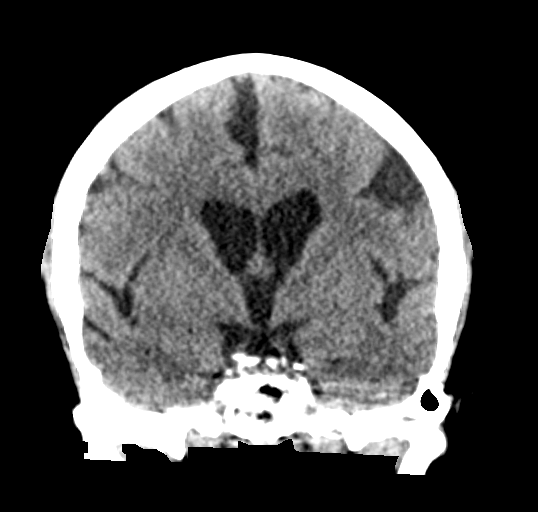
[im 38/68  brain]
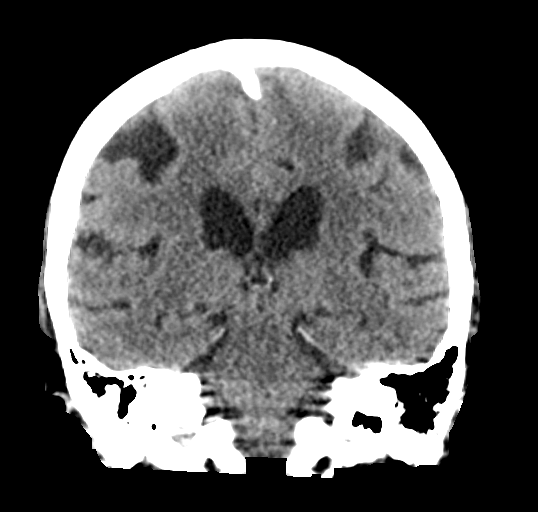

[Series 5: head without sag · sagittal · non-contrast · 0.30mm/px · 3 of 53 slices shown]
[im 18/53  brain]
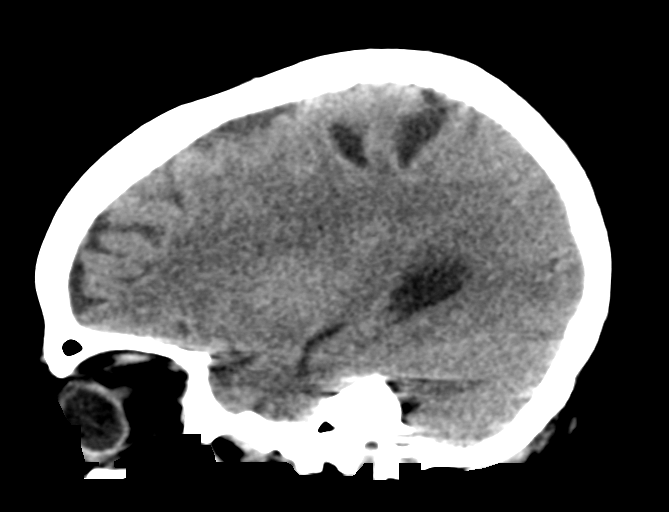
[im 27/53  brain]
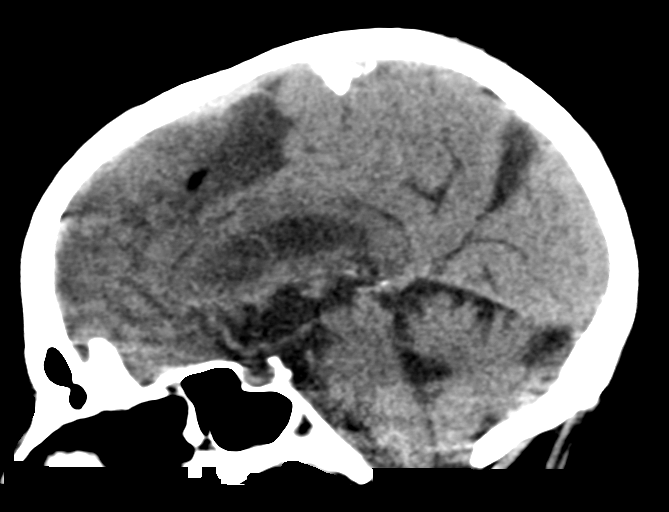
[im 35/53  brain]
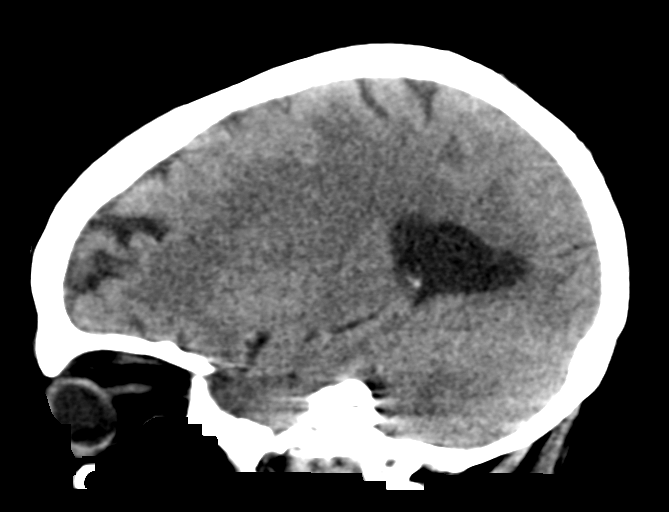

[17 of 47 positions shown; findings below may reference images not displayed]

FINDINGS: Brain: No evidence of acute infarction, hemorrhage, hydrocephalus,
extra-axial collection or mass lesion/mass effect. Mild atrophy is
present.

Vascular: No hyperdense vessels. Mild carotid artery calcifications.

Skull: Minimal inferior mastoid fluid right greater than left. No
skull fracture. Small scattered calvarial lucencies are unchanged.

Sinuses/Orbits: Minimal mucosal thickening in the ethmoid sinuses.
No acute orbital abnormality.

Other: None
IMPRESSION: No definite CT evidence for acute intracranial abnormality

## 2017-02-23 ENCOUNTER — Ambulatory Visit: Payer: Medicare Other | Admitting: Physician Assistant

## 2017-03-09 ENCOUNTER — Ambulatory Visit: Payer: Medicare Other | Admitting: Physician Assistant

## 2017-06-11 ENCOUNTER — Encounter: Payer: Self-pay | Admitting: Physician Assistant

## 2017-06-14 ENCOUNTER — Telehealth: Payer: Self-pay | Admitting: Physician Assistant

## 2017-06-14 DIAGNOSIS — I10 Essential (primary) hypertension: Secondary | ICD-10-CM

## 2017-06-14 NOTE — Telephone Encounter (Signed)
Copied from [place] [phone]. Topic: Quick Communication - See Telephone Encounter >> Jun 14, 2017  3:00 PM Synthia Innocent wrote: CRM for notification. See Telephone encounter for: 06/14/17. Pharmacy is running low on benazepril-hydrochlorthiazide (LOTENSIN HCT) 20-12.5 MG tablet, can only get 30 days since pharmacy is almost out, insurance is charging her more since she isn't getting a 90 day supply. Please advise, change meds?

## 2017-06-15 NOTE — Telephone Encounter (Signed)
Phone call to patient. Per signed authorization to leave detailed message, left voicemail stating purpose of call is to see if it's okay to get lotensin switched to a different [facility] that has more of medication in stock so she doesn't have to pay more for 30 days. Advised she can also call to request pharmacy do this for her as well. Please call if further action needed from clinic staff.

## 2017-06-29 NOTE — Progress Notes (Addendum)
[facility]  PATIENT: RHONNA HOLSTER DOB: 02/18/1946   REASON FOR VISIT: Follow-up for obstructive sleep apnea with CPAP  HISTORY FROM: Patient    HISTORY OF PRESENT ILLNESS: Ms. Ng is a very pleasant 72 year old right-handed woman with an underlying medical history of hypertension, osteoarthritis, hypothyroidism, anxiety, and obesity, who presents for follow-up consultation of her obstructive sleep apnea, after recent sleep study testing. The patient is unaccompanied today. I first met her on 11/24/2015 at the request of her primary care provider, at which time she reported a prior diagnosis of OSA. She needed reevaluation. She had a CPAP machine. I invited her for a sleep study. She had a baseline sleep study, followed by a CPAP titration study. Her baseline sleep study from 01/11/2016 showed a sleep efficiency of only 42.8%, sleep latency was 146 minutes which is markedly delayed, REM latency was also delayed at 189.5 minutes. She had an increased percentage of stage I sleep, and a decreased percentage of REM sleep. Total AHI was 12.4 per hour, REM AHI was 41.3 per hour, supine AHI was 22.3 per hour. Average oxygen saturation was 98%, nadir was 84%. She had mild PLMS with an index of 29.5 per hour and minimal arousals. Based on her medical history and test results and sleep related complaints I invited her for a full night CPAP titration study which she had on 02/17/2016. Sleep efficiency was only 22%, sleep latency was 128.5 minutes, REM was absent. She had an increased percentage of slow-wave sleep, an increased percentage of stage I sleep. CPAP was titrated from 5 cm to 6 cm. She was fitted with a small nasal mask. She had no significant PLMS. I did prescribe CPAP therapy at a pressure of 6 cm based on her test results which were limited.  Today, 06/26/2016 (all dictated new, as well as above notes, some dictation done in note pad or Word, outside of chart, may  appear as copied): I reviewed her CPAP compliance data from 05/26/2016 through 06/24/2016 which is a total of 30 days, during which time she used her machine every night with percent used days greater than 4 hours at 100%, indicating superb compliance with an average usage of 7 hours and 31 minutes, residual AHI borderline at 6 per hour, leak low with the 95th percentile at 1.8 L/m on a pressure of 6 cm with EPR of 2. She reports that she has trouble maintaining sleep. She usually falls asleep okay. She tries to keep a schedule. She works from home. She has no trouble using this new machine but did not dislike her old machine. She admits that she has trouble adapting to something new. Sometimes she wakes up after 6 hours of sleep and cannot go back to sleep. It used to be where she woke up 2 or 3 hours after falling asleep and while this is better she would like to be able to sleep at least 7 hours. She does not like to take medication for sleep. She can drink some milk at times and goes back to sleep after that. She reports no significant change in how she feels. She did not notice a big change in the pressure. She reports that at the time of her second sleep study she had a cough. She then had a upper respiratory infection shortly there after. She feels better in that regard, she has some allergy symptoms which flareup from time to time. UPDATE 07/02/17 CM Ms.Madera, 72 year old female returns for follow-up with obstructive  sleep apnea here for CPAP compliance.  Her compliance data dated 06/01/2017-06/30/2017 shows compliance greater than 4 hours at 100%.  Average usage 7 hours 36 minutes set pressure 8 cm EPR level 1 AHI 3.1 leak in the 95th percentile at 3.  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  Sometimes she wakes up after 6 hours of sleep and cannot get back to sleep she is not interested in any medication.  She returns for reevaluation  REVIEW OF SYSTEMS: Full 14 system review of  systems performed and notable only for those listed, all others are neg:  Constitutional: neg  Cardiovascular: neg Ear/Nose/Throat: neg  Skin: neg Eyes: neg Respiratory: neg Gastroitestinal: neg  Genitourinary urinary frequency Hematology/Lymphatic: neg  Endocrine: Intolerance to cold Musculoskeletal: Arthritis in both knees Allergy/Immunology: Seasonal Neurological: neg Psychiatric: Anxiety Sleep : Obstructive sleep apnea with CPAP   ALLERGIES: Allergies  Allergen Reactions  . Bactrim [Sulfamethoxazole-Trimethoprim]   . Epinephrine     shakey  . Erythromycin   . Sulfa Antibiotics Rash    HOME MEDICATIONS: Outpatient Medications Prior to Visit  Medication Sig Dispense Refill  . acetaminophen (TYLENOL) 325 MG tablet Take 650 mg by mouth every 6 (six) hours as needed for moderate pain (knee pain).    . benazepril-hydrochlorthiazide (LOTENSIN HCT) 20-12.5 MG tablet TAKE ONE TABLET BY MOUTH TWICE A DAY FOR FOR BLOOD PRESSURE 180 tablet 3  . hydrALAZINE (APRESOLINE) 25 MG tablet TAKE TWO TABLETS BY MOUTH IN THE MORNING, THEN ONE TABLET BY MOUTH AT LUNCH, THEN TWO TABLETS BY MOUTH AT DINNER FOR BLOOD PRESSURE 450 tablet 3  . levothyroxine (SYNTHROID, LEVOTHROID) 112 MCG tablet Take 1 tablet (112 mcg total) by mouth daily before breakfast. 90 tablet 3  . liothyronine (CYTOMEL) 5 MCG tablet TAKE 1 TABLET BY MOUTH DAILY FOR THYROID 90 tablet 3   No facility-administered medications prior to visit.     PAST MEDICAL HISTORY: Past Medical History:  Diagnosis Date  . Abnormal perimenopausal bleeding 2005  . Allergy   . Anemia   . Anxiety   . Dysuria 2004  . Endometrial polyp 2006  . Environmental allergies   . Fibroid 02/19/03  . H/O varicella   . History of measles, mumps, or rubella   . Hx of colonic polyps 12/15/2010  . Hypertension 07-19-2010   echo for pre-op EF 55%  normal left wall thickness LA mildly dialated with mitral and tricuspid regurgatation  . Hypothyroidism    . Interstitial cystitis   . Menopausal symptoms 2004  . Obesity   . Osteopenia 02/2012   -1.8 T score right femur neck  . PMB (postmenopausal bleeding) 06/21/10  . Sleep apnea   . Urinary incontinence 2011  . Vitamin D deficiency    history of    PAST SURGICAL HISTORY: Past Surgical History:  Procedure Laterality Date  . COLONOSCOPY  12/15/10  . HYSTEROSCOPY  2006 and May 2012   for fibroids, endometrial polyps  . MOUTH SURGERY    . MYOMECTOMY  1974    FAMILY HISTORY: Family History  Problem Relation Age of Onset  . Kidney disease Father        from uremia  . Benign prostatic hyperplasia Father   . Hypertension Mother   . Colon cancer Paternal Uncle   . Clotting disorder Maternal Grandmother        ????    SOCIAL HISTORY: Social History   Socioeconomic History  . Marital status: Divorced    Spouse name: n/a  .  Number of children: Not on file  . Years of education: college  . Highest education level: Not on file  Occupational History  . Not on file  Social Needs  . Financial resource strain: Not on file  . Food insecurity:    Worry: Not on file    Inability: Not on file  . Transportation needs:    Medical: Not on file    Non-medical: Not on file  Tobacco Use  . Smoking status: Never Smoker  . Smokeless tobacco: Never Used  Substance and Sexual Activity  . Alcohol use: Yes    Alcohol/week: 0.6 oz    Types: 1 Glasses of wine per week    Comment: occasional (1-2 times per week)  . Drug use: No  . Sexual activity: Never    Birth control/protection: Abstinence  Lifestyle  . Physical activity:    Days per week: Not on file    Minutes per session: Not on file  . Stress: Not on file  Relationships  . Social connections:    Talks on phone: Not on file    Gets together: Not on file    Attends religious service: Not on file    Active member of club or organization: Not on file    Attends meetings of clubs or organizations: Not on file    Relationship  status: Not on file  . Intimate partner violence:    Fear of current or ex partner: Not on file    Emotionally abused: Not on file    Physically abused: Not on file    Forced sexual activity: Not on file  Other Topics Concern  . Not on file  Social History Narrative   Lives alone.   Sister stays with her frequently.   Participates in [employer] exercise program.     PHYSICAL EXAM  Vitals:   07/02/17 1527  BP: (!) 142/72  Pulse: 68  SpO2: 98%  Weight: 181 lb 6.4 oz (82.3 kg)  Height: 5' 0.25" (1.53 m)   Body mass index is 35.13 kg/m.  Generalized: Well developed, in no acute distress  Head: normocephalic and atraumatic,. Oropharynx benign  Neck: Supple,  Musculoskeletal: No deformity   Neurological examination   Mentation: Alert oriented to time, place, history taking. Attention span and concentration appropriate. Recent and remote memory intact.  Follows all commands speech and language fluent.   Cranial nerve II-XII: Pupils were equal round reactive to light extraocular movements were full, visual field were full on confrontational test. Facial sensation and strength were normal. hearing was intact to finger rubbing bilaterally. Uvula tongue midline. head turning and shoulder shrug were normal and symmetric.Tongue protrusion into cheek strength was normal. Motor: normal bulk and tone, full strength in the BUE, BLE, fine finger movements normal, no pronator drift. No focal weakness Sensory: normal and symmetric to light touch,  Coordination: finger-nose-finger, heel-to-shin bilaterally, no dysmetria Gait and Station: Rising up from seated position without assistance, normal stance,  moderate stride, good arm swing, smooth turning, able to perform tiptoe, and heel walking without difficulty. Tandem gait is steady  DIAGNOSTIC DATA (LABS, IMAGING, TESTING) - I reviewed patient records, labs, notes, testing and imaging myself where available.  Lab Results  Component  Value Date   WBC 7.3 09/01/2016   HGB 13.3 09/01/2016   HCT 39.7 09/01/2016   MCV 82 09/01/2016   PLT 231 09/01/2016      Component Value Date/Time   NA 142 09/01/2016 1004   K 4.2  09/01/2016 1004   CL 101 09/01/2016 1004   CO2 26 09/01/2016 1004   GLUCOSE 88 09/01/2016 1004   GLUCOSE 93 03/16/2016 2056   BUN 14 09/01/2016 1004   CREATININE 0.71 09/01/2016 1004   CREATININE 0.70 11/13/2015 0829   CALCIUM 9.1 09/01/2016 1004   PROT 6.5 09/01/2016 1004   ALBUMIN 4.3 09/01/2016 1004   AST 14 09/01/2016 1004   ALT 15 09/01/2016 1004   ALKPHOS 59 09/01/2016 1004   BILITOT 0.6 09/01/2016 1004   GFRNONAA 85 09/01/2016 1004   GFRNONAA 87 11/13/2015 0829   GFRAA 98 09/01/2016 1004   GFRAA >89 11/13/2015 0829   Lab Results  Component Value Date   CHOL 185 12/02/2014   HDL 46 12/02/2014   LDLCALC 113 12/02/2014   TRIG 132 12/02/2014   CHOLHDL 4.0 12/02/2014   Lab Results  Component Value Date   HGBA1C 5.6 03/15/2016   No results found for: [id number] Lab Results  Component Value Date   TSH 1.230 09/01/2016      ASSESSMENT AND PLAN KAMEKO HUKILL is a very pleasant 72 y.o.-year old female with an underlying medical history of hypertension, osteoarthritis, hypothyroidism, anxiety, and obesity, who presents for follow-up consultation of her obstructive sleep apnea, on CPAP therapy after her recent sleep studies. Her baseline sleep study from November 2017 confirmed her diagnosis. Data dated 06/01/2017-06/30/2017 shows compliance greater than 4 hours at 100%.  Average usage 7 hours 36 minutes set pressure 8 cm EPR level 1 AHI 3.1 leak in the 95th percentile at 3.She is advised to stay fully compliant with treatment. She is encouraged to try melatonin for sleep which is over-the-counter.  We talked about maintaining a healthy lifestyle as well as good sleep hygiene.   CPAP compliance 100% Continue same settings Follow-up yearly Dennie Bible, [facility], [facility], APRN  [facility] [address], [place], [place] [address] [phone]  I reviewed the above note and documentation by the Nurse Practitioner and agree with the history, physical exam, assessment and plan as outlined above. I was immediately available for face-to-face consultation. [name], MD, PhD [facility])

## 2017-06-30 ENCOUNTER — Encounter: Payer: Self-pay | Admitting: Nurse Practitioner

## 2017-07-02 ENCOUNTER — Encounter: Payer: Self-pay | Admitting: Nurse Practitioner

## 2017-07-02 ENCOUNTER — Ambulatory Visit: Payer: Medicare Other | Admitting: Nurse Practitioner

## 2017-07-02 ENCOUNTER — Telehealth: Payer: Self-pay | Admitting: Nurse Practitioner

## 2017-07-02 DIAGNOSIS — Z9989 Dependence on other enabling machines and devices: Secondary | ICD-10-CM | POA: Diagnosis not present

## 2017-07-02 DIAGNOSIS — G4733 Obstructive sleep apnea (adult) (pediatric): Secondary | ICD-10-CM

## 2017-07-02 NOTE — Telephone Encounter (Signed)
Pt. States she will call back to schedule 1 year if she feels like she needs it.

## 2017-07-02 NOTE — Patient Instructions (Signed)
CPAP compliance 100% Continue same settings Follow-up yearly  

## 2017-07-31 ENCOUNTER — Encounter (HOSPITAL_COMMUNITY): Payer: Self-pay | Admitting: Emergency Medicine

## 2017-07-31 ENCOUNTER — Emergency Department (HOSPITAL_COMMUNITY)
Admission: EM | Admit: 2017-07-31 | Discharge: 2017-08-01 | Disposition: A | Discharge status: Home / Self Care | Payer: Medicare Other | Attending: Emergency Medicine | Admitting: Emergency Medicine

## 2017-07-31 DIAGNOSIS — F419 Anxiety disorder, unspecified: Secondary | ICD-10-CM | POA: Diagnosis not present

## 2017-07-31 DIAGNOSIS — E039 Hypothyroidism, unspecified: Secondary | ICD-10-CM | POA: Diagnosis not present

## 2017-07-31 DIAGNOSIS — Z79899 Other long term (current) drug therapy: Secondary | ICD-10-CM | POA: Insufficient documentation

## 2017-07-31 DIAGNOSIS — R42 Dizziness and giddiness: Secondary | ICD-10-CM

## 2017-07-31 DIAGNOSIS — I10 Essential (primary) hypertension: Secondary | ICD-10-CM

## 2017-07-31 LAB — URINALYSIS, ROUTINE W REFLEX MICROSCOPIC
BILIRUBIN URINE: NEGATIVE
Glucose, UA: NEGATIVE mg/dL
Hgb urine dipstick: NEGATIVE
Ketones, ur: NEGATIVE mg/dL
LEUKOCYTES UA: NEGATIVE
NITRITE: NEGATIVE
Protein, ur: NEGATIVE mg/dL
SPECIFIC GRAVITY, URINE: 1.009 (ref 1.005–1.030)
pH: 7 (ref 5.0–8.0)

## 2017-07-31 LAB — BASIC METABOLIC PANEL
ANION GAP: 11 (ref 5–15)
BUN: 17 mg/dL (ref 6–20)
CALCIUM: 9.2 mg/dL (ref 8.9–10.3)
CO2: 26 mmol/L (ref 22–32)
Chloride: 105 mmol/L (ref 101–111)
Creatinine, Ser: 0.71 mg/dL (ref 0.44–1.00)
Glucose, Bld: 97 mg/dL (ref 65–99)
Potassium: 3.7 mmol/L (ref 3.5–5.1)
SODIUM: 142 mmol/L (ref 135–145)

## 2017-07-31 LAB — CBC
HEMATOCRIT: 45 % (ref 36.0–46.0)
Hemoglobin: 14.6 g/dL (ref 12.0–15.0)
MCH: 26.8 pg (ref 26.0–34.0)
MCHC: 32.4 g/dL (ref 30.0–36.0)
MCV: 82.7 fL (ref 78.0–100.0)
Platelets: 268 10*3/uL (ref 150–400)
RBC: 5.44 MIL/uL — ABNORMAL HIGH (ref 3.87–5.11)
RDW: 13.6 % (ref 11.5–15.5)
WBC: 8.4 10*3/uL (ref 4.0–10.5)

## 2017-07-31 NOTE — ED Triage Notes (Addendum)
Patient to ED c/o hypertension - got a reading of 200/140 earlier today. Reports feeling lightheaded all day and "stressed out." Patient denies headache or vision changes. Neuro intact.

## 2017-08-01 ENCOUNTER — Encounter: Payer: Self-pay | Admitting: Family Medicine

## 2017-08-01 ENCOUNTER — Emergency Department (HOSPITAL_COMMUNITY): Payer: Medicare Other

## 2017-08-01 LAB — CBG MONITORING, ED: Glucose-Capillary: 87 mg/dL (ref 65–99)

## 2017-08-01 NOTE — ED Notes (Signed)
Pt stable, ambulatory, and verbalizes understanding of d/c instructions.  

## 2017-08-01 NOTE — ED Provider Notes (Signed)
[facility] EMERGENCY DEPARTMENT Provider Note   CSN: [id number] Arrival date & time: 07/31/17  1946     History   Chief Complaint Chief Complaint  Patient presents with  . Hypertension  . Dizziness    HPI [name] is a 72 y.o. female.  The history is provided by the patient and a relative. No language interpreter was used.  Hypertension   Dizziness    [name] is a 72 y.o. female who presents to the Emergency Department complaining of dizziness.  She reports an episode of dizziness and lightheadedness that occurred yesterday. She felt like she was floating at the time and she had difficulty concentrating. Beside lasted for several minutes. She had no associated headache, nausea, vomiting, visual changes, numbness, weakness. She does have a history of anxiety as well as high blood pressure. She does endorse being under increased stress lately. She called the physician on call for her insurance company who recommended ED evaluation. She has missed her last two doses of blood pressure medications due to being in the emergency department.  She currently has no complaints.    Past Medical History:  Diagnosis Date  . Abnormal perimenopausal bleeding 2005  . Allergy   . Anemia   . Anxiety   . Dysuria 2004  . Endometrial polyp 2006  . Environmental allergies   . Fibroid 02/19/03  . H/O varicella   . History of measles, mumps, or rubella   . Hx of colonic polyps 12/15/2010  . Hypertension 07-19-2010   echo for pre-op EF 55%  normal left wall thickness LA mildly dialated with mitral and tricuspid regurgatation  . Hypothyroidism   . Interstitial cystitis   . Menopausal symptoms 2004  . Obesity   . Osteopenia 02/2012   -1.8 T score right femur neck  . PMB (postmenopausal bleeding) 06/21/10  . Sleep apnea   . Urinary incontinence 2011  . Vitamin D deficiency    history of    Patient Active Problem List   Diagnosis Date Noted  .  Obstructive sleep apnea treated with continuous positive airway pressure (CPAP) 07/02/2017  . Osteopenia 09/01/2016  . Urinary incontinence in female 09/01/2016  . Vitamin D deficiency 09/01/2016  . BMI 35.0-35.9,adult 11/16/2015  . Essential hypertension, benign 04/28/2012  . Osteoarthritis of left knee 04/28/2012  . Hypothyroidism 04/28/2012  . Hx of colonic polyps 12/15/2010  . Anxiety 11/17/2010  . Sleep apnea 11/17/2010    Past Surgical History:  Procedure Laterality Date  . COLONOSCOPY  12/15/10  . HYSTEROSCOPY  2006 and May 2012   for fibroids, endometrial polyps  . MOUTH SURGERY    . MYOMECTOMY  1974     OB History   None      Home Medications    Prior to Admission medications   Medication Sig Start Date End Date Taking? Authorizing Provider  acetaminophen (TYLENOL) 500 MG tablet Take 1,000 mg by mouth every 6 (six) hours as needed for moderate pain (knee pain).    Yes [provider]  benazepril-hydrochlorthiazide (LOTENSIN HCT) 20-12.5 MG tablet TAKE ONE TABLET BY MOUTH TWICE A DAY FOR FOR BLOOD PRESSURE Patient taking differently: 2 tablets daily with lunch. TAKE ONE TABLET BY MOUTH TWICE A DAY FOR FOR BLOOD PRESSURE 02/07/17  Yes Sagardia, Ines Bloomer, MD  hydrALAZINE (APRESOLINE) 25 MG tablet TAKE TWO TABLETS BY MOUTH IN THE MORNING, THEN ONE TABLET BY MOUTH AT LUNCH, THEN TWO TABLETS BY MOUTH AT DINNER FOR BLOOD  PRESSURE 02/07/17  Yes Sagardia, Ines Bloomer, MD  levothyroxine (SYNTHROID, LEVOTHROID) 112 MCG tablet Take 1 tablet (112 mcg total) by mouth daily before breakfast. 01/04/17  Yes [name], MD  liothyronine (CYTOMEL) 5 MCG tablet TAKE 1 TABLET BY MOUTH DAILY FOR THYROID 01/04/17  Yes [name], MD    Family History Family History  Problem Relation Age of Onset  . Kidney disease Father        from uremia  . Benign prostatic hyperplasia Father   . Hypertension Mother   . Colon cancer Paternal Uncle   . Clotting disorder  Maternal Grandmother        ????    Social History Social History   Tobacco Use  . Smoking status: Never Smoker  . Smokeless tobacco: Never Used  Substance Use Topics  . Alcohol use: Yes    Alcohol/week: 0.6 oz    Types: 1 Glasses of wine per week    Comment: occasional (1-2 times per week)  . Drug use: No     Allergies   Bactrim [sulfamethoxazole-trimethoprim]; Epinephrine; Erythromycin; and Sulfa antibiotics   Review of Systems Review of Systems  Neurological: Positive for dizziness.  All other systems reviewed and are negative.    Physical Exam Updated Vital Signs BP 125/74 (BP Location: Right Arm)   Pulse 71   Temp 97.8 F (36.6 C)   Resp 20   SpO2 96%   Physical Exam  Constitutional: She is oriented to person, place, and time. She appears well-developed and well-nourished.  HENT:  Head: Normocephalic and atraumatic.  Eyes: Pupils are equal, round, and reactive to light. EOM are normal.  Cardiovascular: Normal rate and regular rhythm.  No murmur heard. Pulmonary/Chest: Effort normal and breath sounds normal. No respiratory distress.  Abdominal: Soft. There is no tenderness. There is no rebound and no guarding.  Musculoskeletal: She exhibits no edema or tenderness.  Neurological: She is alert and oriented to person, place, and time. No cranial nerve deficit or sensory deficit. Coordination normal.  5/5 strength in all four extremities.   Skin: Skin is warm and dry.  Psychiatric: Her behavior is normal.  Mildly anxious  Nursing note and vitals reviewed.    ED Treatments / Results  Labs (all labs ordered are listed, but only abnormal results are displayed) Labs Reviewed  CBC - Abnormal; Notable for the following components:      Result Value   RBC 5.44 (*)    All other components within normal limits  BASIC METABOLIC PANEL  URINALYSIS, ROUTINE W REFLEX MICROSCOPIC  CBG MONITORING, ED    EKG EKG Interpretation  Date/Time:  Tuesday Jul 31 2017  20:30:27 EDT Ventricular Rate:  61 PR Interval:  154 QRS Duration: 82 QT Interval:  474 QTC Calculation: 477 R Axis:   58 Text Interpretation:  Normal sinus rhythm Nonspecific ST and T wave abnormality Abnormal ECG Confirmed by Quintella Reichert [phone]) on 08/01/2017 10:03:49 AM   Radiology Mr Brain Wo Contrast  Result Date: 08/01/2017 CLINICAL DATA:  Lightheadedness, feeling unwell. Hypertensive. Evaluate vertigo. History of hypertension. EXAM: MRI HEAD WITHOUT CONTRAST TECHNIQUE: Multiplanar, multiecho pulse sequences of the brain and surrounding structures were obtained without intravenous contrast. COMPARISON:  CT HEAD March 16, 2016 FINDINGS: INTRACRANIAL CONTENTS: No reduced diffusion to suggest acute ischemia. No susceptibility artifact to suggest hemorrhage. The ventricles and sulci are normal for patient's age. Faint supratentorial white matter FLAIR T2 hyperintensities compatible with mild chronic small vessel ischemic changes, less than expected for age.  No suspicious parenchymal signal, masses, mass effect. No abnormal extra-axial fluid collections. No extra-axial masses. VASCULAR: Normal major intracranial vascular flow voids present at skull base. SKULL AND UPPER CERVICAL SPINE: No abnormal sellar expansion. No suspicious calvarial bone marrow signal. Craniocervical junction maintained. Subcentimeter T1 bright LEFT parietal calvarial hemangioma or possible epidermal inclusion cyst. SINUSES/ORBITS: The mastoid air-cells and included paranasal sinuses are well-aerated.The included ocular globes and orbital contents are non-suspicious. OTHER: None. IMPRESSION: Negative noncontrast MRI of the head for age. Electronically Signed   By: Elon Alas M.D.   On: 08/01/2017 15:34    Procedures Procedures (including critical care time)  Medications Ordered in ED Medications - No data to display   Initial Impression / Assessment and Plan / ED Course  I have reviewed the triage vital  signs and the nursing notes.  Pertinent labs & imaging results that were available during my care of the patient were reviewed by me and considered in my medical decision making (see chart for details).     Patient with history of hypertension here for evaluation of episode of dizziness and confusion that was very brief yesterday. She has a normal neurologic exam in the emergency department. She is hypotensive but she missed her home meds. Presentation is not consistent with hypertensive urgency, ACS, CVA. MRI obtained and is negative for acute mass or CVA. Discussed with patient home care for hypertension, possible anxiety. Discussed outpatient follow-up as well as return precautions.  Final Clinical Impressions(s) / ED Diagnoses   Final diagnoses:  Lightheadedness  Essential hypertension    ED Discharge Orders    None       Quintella Reichert, MD 08/01/17 [id number]

## 2017-08-01 NOTE — ED Notes (Signed)
Pt CBG 87. Notified Jenny Reichmann, Therapist, sports.

## 2017-08-14 ENCOUNTER — Encounter: Payer: Self-pay | Admitting: Physician Assistant

## 2017-08-14 ENCOUNTER — Telehealth: Payer: Self-pay | Admitting: Physician Assistant

## 2017-08-14 ENCOUNTER — Other Ambulatory Visit: Payer: Self-pay

## 2017-08-14 ENCOUNTER — Ambulatory Visit (INDEPENDENT_AMBULATORY_CARE_PROVIDER_SITE_OTHER): Payer: Medicare Other | Admitting: Physician Assistant

## 2017-08-14 VITALS — BP 130/64 | HR 67 | Temp 97.7°F | Resp 18 | Ht 61.02 in | Wt 179.2 lb

## 2017-08-14 DIAGNOSIS — Z0000 Encounter for general adult medical examination without abnormal findings: Secondary | ICD-10-CM | POA: Diagnosis not present

## 2017-08-14 DIAGNOSIS — G4733 Obstructive sleep apnea (adult) (pediatric): Secondary | ICD-10-CM

## 2017-08-14 DIAGNOSIS — Z131 Encounter for screening for diabetes mellitus: Secondary | ICD-10-CM

## 2017-08-14 DIAGNOSIS — I10 Essential (primary) hypertension: Secondary | ICD-10-CM

## 2017-08-14 DIAGNOSIS — M8580 Other specified disorders of bone density and structure, unspecified site: Secondary | ICD-10-CM | POA: Diagnosis not present

## 2017-08-14 DIAGNOSIS — E559 Vitamin D deficiency, unspecified: Secondary | ICD-10-CM

## 2017-08-14 DIAGNOSIS — E039 Hypothyroidism, unspecified: Secondary | ICD-10-CM

## 2017-08-14 DIAGNOSIS — M1712 Unilateral primary osteoarthritis, left knee: Secondary | ICD-10-CM

## 2017-08-14 DIAGNOSIS — Z9989 Dependence on other enabling machines and devices: Secondary | ICD-10-CM | POA: Diagnosis not present

## 2017-08-14 DIAGNOSIS — F419 Anxiety disorder, unspecified: Secondary | ICD-10-CM

## 2017-08-14 DIAGNOSIS — Z86010 Personal history of colonic polyps: Secondary | ICD-10-CM | POA: Diagnosis not present

## 2017-08-14 NOTE — Patient Instructions (Addendum)
Try to eat a snack at night to prevent am shaking feelings. Take your BP while sitting with both feet on the floor and BP cuff at the level of your heart. Also bring your BP cuff into your next visit.    IF you received an x-ray today, you will receive an invoice from [facility]. Please contact [facility] at [phone] with questions or concerns regarding your invoice.   IF you received labwork today, you will receive an invoice from [facility]. Please contact [facility] at [phone] with questions or concerns regarding your invoice.   Our billing staff will not be able to assist you with questions regarding bills from these companies.  You will be contacted with the lab results as soon as they are available. The fastest way to get your results is to activate your My Chart account. Instructions are located on the last page of this paperwork. If you have not heard from [place] regarding the results in 2 weeks, please contact this office.     Advance Directive Advance directives are legal documents that let you make choices ahead of time about your health care and medical treatment in case you become unable to communicate for yourself. Advance directives are a way for you to communicate your wishes to family, friends, and health care providers. This can help convey your decisions about end-of-life care if you become unable to communicate. Discussing and writing advance directives should happen over time rather than all at once. Advance directives can be changed depending on your situation and what you want, even after you have signed the advance directives. If you do not have an advance directive, some states assign family decision makers to act on your behalf based on how closely you are related to them. Each state has its own laws regarding advance directives. You may want to check with your health care provider, attorney, or state representative about the laws in your state. There  are different types of advance directives, such as:  Medical power of attorney.  Living will.  Do not resuscitate (DNR) or do not attempt resuscitation (DNAR) order.  Health care proxy and medical power of attorney A health care proxy, also called a health care agent, is a person who is appointed to make medical decisions for you in cases in which you are unable to make the decisions yourself. Generally, people choose someone they know well and trust to represent their preferences. Make sure to ask this person for an agreement to act as your proxy. A proxy may have to exercise judgment in the event of a medical decision for which your wishes are not known. A medical power of attorney is a legal document that names your health care proxy. Depending on the laws in your state, after the document is written, it may also need to be:  Signed.  Notarized.  Dated.  Copied.  Witnessed.  Incorporated into your medical record.  You may also want to appoint someone to manage your financial affairs in a situation in which you are unable to do so. This is called a durable power of attorney for finances. It is a separate legal document from the durable power of attorney for health care. You may choose the same person or someone different from your health care proxy to act as your agent in financial matters. If you do not appoint a proxy, or if there is a concern that the proxy is not acting in your best interests, a court-appointed guardian may  be designated to act on your behalf. Living will A living will is a set of instructions documenting your wishes about medical care when you cannot express them yourself. Health care providers should keep a copy of your living will in your medical record. You may want to give a copy to family members or friends. To alert caregivers in case of an emergency, you can place a card in your wallet to let them know that you have a living will and where they can find it. A  living will is used if you become:  Terminally ill.  Incapacitated.  Unable to communicate or make decisions.  Items to consider in your living will include:  The use or non-use of life-sustaining equipment, such as dialysis machines and breathing machines (ventilators).  A DNR or DNAR order, which is the instruction not to use cardiopulmonary resuscitation (CPR) if breathing or heartbeat stops.  The use or non-use of tube feeding.  Withholding of food and fluids.  Comfort (palliative) care when the goal becomes comfort rather than a cure.  Organ and tissue donation.  A living will does not give instructions for distributing your money and property if you should pass away. It is recommended that you seek the advice of a lawyer when writing a will. Decisions about taxes, beneficiaries, and asset distribution will be legally binding. This process can relieve your family and friends of any concerns surrounding disputes or questions that may come up about the distribution of your assets. DNR or DNAR A DNR or DNAR order is a request not to have CPR in the event that your heart stops beating or you stop breathing. If a DNR or DNAR order has not been made and shared, a health care provider will try to help any patient whose heart has stopped or who has stopped breathing. If you plan to have surgery, talk with your health care provider about how your DNR or DNAR order will be followed if problems occur. Summary  Advance directives are the legal documents that allow you to make choices ahead of time about your health care and medical treatment in case you become unable to communicate for yourself.  The process of discussing and writing advance directives should happen over time. You can change the advance directives, even after you have signed them.  Advance directives include DNR or DNAR orders, living wills, and designating an agent as your medical power of attorney. This information is not  intended to replace advice given to you by your health care provider. Make sure you discuss any questions you have with your health care provider. Document Released: 05/30/2007 Document Revised: 01/10/2016 Document Reviewed: 01/10/2016 Elsevier Interactive Patient Education  2017 [employer].

## 2017-08-14 NOTE — Telephone Encounter (Signed)
Pt will call with [place]white [place] new medicare number or stop by office FR . If pt calls in please route message to PCP

## 2017-08-14 NOTE — Progress Notes (Signed)
Presents today for Medicare Visit.   Interpreter used for this visit? no  Other items to address today: hypertension, hypothyroidism, knee arthritis  Cancer Screening: Cervical (every2 years - ages 21-65): n/a Breast (annually - ages 29-75): yes - she goes every 2 years Colon (every 97 years - ages 25-75): yes - 2016 - she has to go back this year due to polyp removal    Other screening: Bone density screening (every 2 years - ages >95): 2016 - osteopenia  ETOH use: social rare Dental visits: every 4 months - peridontal disease Vision visits: wear glasses - every year or so Typical Meals: 3 meals, snacks (out to eat) - frozen dinners at home Typical Beverages: coffee (mostly decaffeinated), trying to increase her water intake Exercise: when she works (5-6 months a year) she does not exercise - when not working [employer] classes  Lab Screening: Last screening for diabetes (annually): 03/2016 Last lipid screening (every 5 years): 11/2014 - normal  Activities of Daily Living In your present state of health, do you have any difficulty performing the following activities?:  Driving? no Managing money?  No - she stresses but does not have trouble with money Feeding yourself? No Getting from bed to chair? No Climbing a flight of stairs? No Preparing food and eating?: No Bathing or showering? No Getting dressed: No Getting to the toilet? No Using the toilet:No Moving around from place to place: No In the past year have you fallen or had a near fall?:Yes - missed a curb that was uneven   Are you sexually active?  No  Do you have more than one partner?  Not sexual active  Hearing Difficulties: No Do you often ask people to speak up or repeat themselves? No Do you experience ringing or noises in your ears? occasionally Do you have difficulty understanding soft or whispered voices? Not sure   Do you feel that you have a problem with memory? No  Do you often misplace  items? No  Do you feel safe at home?  Yes  Cognitive Testing  Alert? Yes  Normal Appearance?Yes  Oriented to person? Yes  Place? Yes   Time? Yes    Displays appropriate judgment?Yes  Can read the correct time from a watch face?Yes  ADVANCE DIRECTIVES: Discussed: no On File: no Materials Provided: yes Patient desires CPR (Yes ), mechanical ventilation (Yes ), prolonged artificial support (may include mechanical ventilation, tube/PEG feeding, etc) (Yes ).  Depression screen [facility] 2/9 08/14/2017 02/07/2017 01/18/2017 09/01/2016 03/16/2016  Decreased Interest 3 0 0 0 0  Down, Depressed, Hopeless 3 0 0 0 0  PHQ - 2 Score 6 0 0 0 0  Altered sleeping 3 - - - -  Tired, decreased energy 3 - - - -  Change in appetite 1 - - - -  Feeling bad or failure about yourself  0 - - - -  Trouble concentrating 1 - - - -  Moving slowly or fidgety/restless 0 - - - -  Suicidal thoughts 0 - - - -  PHQ-9 Score 14 - - - -     Functional Status Survey: Is the patient deaf or have difficulty hearing?: Yes Does the patient have difficulty seeing, even when wearing glasses/contacts?: No Does the patient have difficulty concentrating, remembering, or making decisions?: Yes Does the patient have difficulty walking or climbing stairs?: Yes Does the patient have difficulty dressing or bathing?: No Does the patient have difficulty doing errands alone such  as visiting a [place] office or shopping?: No   Fall Risk  08/14/2017 02/07/2017 01/18/2017 09/01/2016 03/16/2016  Falls in the past year? Yes No No No No  Number falls in past yr: 1 - - - -  Injury with Fall? No - - - -    Immunization status:  Immunization History  Administered Date(s) Administered  . DTaP 03/05/2008  . Pneumococcal Conjugate-13 11/13/2015  . Pneumococcal-Unspecified 06/27/2010  . Tdap 02/12/2015    Health Maintenance Due  Topic Date Due  . COLONOSCOPY  07/27/2017    Patient Care Team: [name], PA-C as PCP - General  (Family Medicine) [name] [name], MD as Consulting Physician (Obstetrics and Gynecology) [name], MD as Attending Physician (Neurology) [name], MD as Consulting Physician (Internal Medicine)   Patient Active Problem List   Diagnosis Date Noted  . Obstructive sleep apnea treated with continuous positive airway pressure (CPAP)  Uses CPAP every night 07/02/2017  . Osteopenia - tries to take her Vit D daily with her calcium 09/01/2016  . Urinary incontinence in female - up at night at least once - goes to restroom multiple times during the day with lots of pressure - does not want to be on another medication 09/01/2016  . Vitamin D deficiency - on OTC supplements 09/01/2016  . BMI 35.0-35.9,adult 11/16/2015  . Essential hypertension, benign - on medication - was in hospital 2 weeks ago with high BP - she checks her BP at home - but she gets very nervous - the cuff gets really tight and she does not like that - she checks with her arm hanging at home - she has never checked the accuracy of her machine  04/28/2012  . Osteoarthritis of left knee - would like a handicapped stickers - she does not always use but does when the parking lots are really full 04/28/2012  . Hypothyroidism - on medication 04/28/2012  . Hx of colonic polyps -needs to schedule her colonoscopy for this year 12/15/2010  . Anxiety - seems to have generalized anxiety - stressed about job, money, life situations but does not want medications- she is using mindfullness to help with her her stress levels 11/17/2010  . Sleep apnea - uses CPAP 11/17/2010     Past Medical History:  Diagnosis Date  . Abnormal perimenopausal bleeding 2005  . Allergy   . Anemia   . Anxiety   . Dysuria 2004  . Endometrial polyp 2006  . Environmental allergies   . Fibroid 02/19/03  . H/O varicella   . History of measles, mumps, or rubella   . Hx of colonic polyps 12/15/2010  . Hypertension 07-19-2010   echo for pre-op EF 55%   normal left wall thickness LA mildly dialated with mitral and tricuspid regurgatation  . Hypothyroidism   . Interstitial cystitis   . Menopausal symptoms 2004  . Obesity   . Osteopenia 02/2012   -1.8 T score right femur neck  . PMB (postmenopausal bleeding) 06/21/10  . Sleep apnea   . Urinary incontinence 2011  . Vitamin D deficiency    history of     Past Surgical History:  Procedure Laterality Date  . COLONOSCOPY  12/15/10  . HYSTEROSCOPY  2006 and May 2012   for fibroids, endometrial polyps  . MOUTH SURGERY    . MYOMECTOMY  1974     Family History  Problem Relation Age of Onset  . Kidney disease Father        from uremia  .  Benign prostatic hyperplasia Father   . Hypertension Mother   . Colon cancer Paternal Uncle   . Clotting disorder Maternal Grandmother        ????     Social History   Socioeconomic History  . Marital status: Divorced    Spouse name: n/a  . Number of children: 0  . Years of education: college  . Highest education level: Not on file  Occupational History  . Not on file  Social Needs  . Financial resource strain: Not on file  . Food insecurity:    Worry: Not on file    Inability: Not on file  . Transportation needs:    Medical: Not on file    Non-medical: Not on file  Tobacco Use  . Smoking status: Never Smoker  . Smokeless tobacco: Never Used  Substance and Sexual Activity  . Alcohol use: Yes    Alcohol/week: 0.6 oz    Types: 1 Glasses of wine per week    Comment: occasional (1-2 times per week)  . Drug use: No  . Sexual activity: Not Currently    Birth control/protection: Abstinence  Lifestyle  . Physical activity:    Days per week: Not on file    Minutes per session: Not on file  . Stress: Not on file  Relationships  . Social connections:    Talks on phone: Not on file    Gets together: Not on file    Attends religious service: Not on file    Active member of club or organization: Not on file    Attends meetings of  clubs or organizations: Not on file    Relationship status: Not on file  . Intimate partner violence:    Fear of current or ex partner: Not on file    Emotionally abused: Not on file    Physically abused: Not on file    Forced sexual activity: Not on file  Other Topics Concern  . Not on file  Social History Narrative   Lives alone.   Sister stays with her frequently.   Participates in [employer] exercise program.     Allergies  Allergen Reactions  . Bactrim [Sulfamethoxazole-Trimethoprim]   . Epinephrine     shakey  . Erythromycin   . Sulfa Antibiotics Rash    Prior to Admission medications   Medication Sig Start Date End Date Taking? Authorizing Provider  acetaminophen (TYLENOL) 500 MG tablet Take 1,000 mg by mouth every 6 (six) hours as needed for moderate pain (knee pain).    Yes [provider]  benazepril-hydrochlorthiazide (LOTENSIN HCT) 20-12.5 MG tablet TAKE ONE TABLET BY MOUTH TWICE A DAY FOR FOR BLOOD PRESSURE Patient taking differently: 2 tablets daily with lunch. TAKE ONE TABLET BY MOUTH TWICE A DAY FOR FOR BLOOD PRESSURE 02/07/17  Yes Sagardia, Ines Bloomer, MD  hydrALAZINE (APRESOLINE) 25 MG tablet TAKE TWO TABLETS BY MOUTH IN THE MORNING, THEN ONE TABLET BY MOUTH AT LUNCH, THEN TWO TABLETS BY MOUTH AT DINNER FOR BLOOD PRESSURE 02/07/17  Yes Sagardia, Ines Bloomer, MD  levothyroxine (SYNTHROID, LEVOTHROID) 112 MCG tablet Take 1 tablet (112 mcg total) by mouth daily before breakfast. 01/04/17  Yes [name], MD  liothyronine (CYTOMEL) 5 MCG tablet TAKE 1 TABLET BY MOUTH DAILY FOR THYROID 01/04/17  Yes [name], MD    ELECTROCARDIOGRAM    Review of Systems  Genitourinary: Positive for difficulty urinating (some urgency).  Psychiatric/Behavioral: The patient is nervous/anxious (due to stress, generalized anxiety).  PHYSICAL EXAM: BP 130/64   Pulse 67   Temp 97.7 F (36.5 C) (Oral)   Resp 18   Ht 5' 1.02" (1.55 m)   Wt 179 lb  3.2 oz (81.3 kg)   SpO2 98%   BMI 33.83 kg/m   Wt Readings from Last 3 Encounters:  08/14/17 179 lb 3.2 oz (81.3 kg)  07/02/17 181 lb 6.4 oz (82.3 kg)  02/07/17 180 lb 12.8 oz (82 kg)      Visual Acuity Screening   Right eye Left eye Both eyes  Without correction:     With correction: 20/30 20/25-2 20/25-1    Physical Exam  Vitals reviewed. Constitutional: She is oriented to person, place, and time. She appears well-developed and well-nourished.  HENT:  Head: Normocephalic and atraumatic.  Right Ear: External ear normal.  Left Ear: External ear normal.  Nose: Nose normal.  Mouth/Throat: Oropharynx is clear and moist.  Eyes: Pupils are equal, round, and reactive to light. Conjunctivae and EOM are normal.  Neck: Normal range of motion. Neck supple. Carotid bruit is not present. No thyroid mass and no thyromegaly present.  Cardiovascular: Normal rate, regular rhythm and normal heart sounds.  No murmur heard. Respiratory: Effort normal and breath sounds normal. She has no wheezes.  GI: Soft. Bowel sounds are normal.  Musculoskeletal: Normal range of motion.  Neurological: She is alert and oriented to person, place, and time.  Skin: Skin is warm and dry.  Psychiatric: She has a normal mood and affect. Her behavior is normal. Judgment and thought content normal.    Education/Counseling: yes diet and exercise yes prevention of chronic diseases Non smoker- smoking/tobacco cessation yes review "Covered Medicare Preventive Services"    ASSESSMENT/PLAN: Medicare annual wellness visit, subsequent  Annual physical exam  Osteopenia, unspecified location  Vitamin D deficiency  Essential hypertension  Screening for diabetes mellitus  Acquired hypothyroidism  Obstructive sleep apnea treated with continuous positive airway pressure (CPAP)  Primary osteoarthritis of left knee  Hx of colonic polyps  Anxiety   Windell Hummingbird PA-C  [facility] 08/14/2017 9:52 AM

## 2017-08-14 NOTE — Telephone Encounter (Signed)
Pt will call with medicare

## 2017-08-14 NOTE — Telephone Encounter (Signed)
error 

## 2017-08-15 LAB — T4, FREE: Free T4: 1.82 ng/dL — ABNORMAL HIGH (ref 0.82–1.77)

## 2017-08-15 LAB — VITAMIN D 25 HYDROXY (VIT D DEFICIENCY, FRACTURES): VIT D 25 HYDROXY: 18.8 ng/mL — AB (ref 30.0–100.0)

## 2017-08-15 LAB — TSH: TSH: 1.24 u[IU]/mL (ref 0.450–4.500)

## 2017-08-15 LAB — HEMOGLOBIN A1C
Est. average glucose Bld gHb Est-mCnc: 111 mg/dL
Hgb A1c MFr Bld: 5.5 % (ref 4.8–5.6)

## 2017-08-17 MED ORDER — VITAMIN D (ERGOCALCIFEROL) 1.25 MG (50000 UNIT) PO CAPS: 50000.0000 [IU] | ORAL_CAPSULE | ORAL | 0 refills | Status: DC

## 2017-08-20 ENCOUNTER — Encounter: Payer: Self-pay | Admitting: Physician Assistant

## 2017-08-22 ENCOUNTER — Other Ambulatory Visit: Payer: Self-pay | Admitting: Physician Assistant

## 2017-08-22 DIAGNOSIS — Z1231 Encounter for screening mammogram for malignant neoplasm of breast: Secondary | ICD-10-CM

## 2017-10-16 ENCOUNTER — Ambulatory Visit
Admission: RE | Admit: 2017-10-16 | Discharge: 2017-10-16 | Disposition: A | Discharge status: Home / Self Care | Payer: Medicare Other | Source: Ambulatory Visit | Attending: Physician Assistant | Admitting: Physician Assistant

## 2017-10-16 DIAGNOSIS — E559 Vitamin D deficiency, unspecified: Secondary | ICD-10-CM

## 2017-10-16 DIAGNOSIS — Z1231 Encounter for screening mammogram for malignant neoplasm of breast: Secondary | ICD-10-CM

## 2017-10-16 DIAGNOSIS — M8580 Other specified disorders of bone density and structure, unspecified site: Secondary | ICD-10-CM

## 2017-10-17 ENCOUNTER — Other Ambulatory Visit: Payer: Self-pay | Admitting: Physician Assistant

## 2017-10-17 DIAGNOSIS — R921 Mammographic calcification found on diagnostic imaging of breast: Secondary | ICD-10-CM

## 2017-10-19 ENCOUNTER — Other Ambulatory Visit: Payer: Self-pay | Admitting: Physician Assistant

## 2017-10-19 ENCOUNTER — Ambulatory Visit
Admission: RE | Admit: 2017-10-19 | Discharge: 2017-10-19 | Disposition: A | Discharge status: Home / Self Care | Payer: Medicare Other | Source: Ambulatory Visit | Attending: Physician Assistant | Admitting: Physician Assistant

## 2017-10-19 DIAGNOSIS — N6320 Unspecified lump in the left breast, unspecified quadrant: Secondary | ICD-10-CM

## 2017-10-19 DIAGNOSIS — R921 Mammographic calcification found on diagnostic imaging of breast: Secondary | ICD-10-CM

## 2017-10-22 ENCOUNTER — Encounter: Payer: Self-pay | Admitting: Internal Medicine

## 2017-10-26 ENCOUNTER — Ambulatory Visit
Admission: RE | Admit: 2017-10-26 | Discharge: 2017-10-26 | Disposition: A | Discharge status: Home / Self Care | Payer: Medicare Other | Source: Ambulatory Visit | Attending: Physician Assistant | Admitting: Physician Assistant

## 2017-10-26 ENCOUNTER — Other Ambulatory Visit: Payer: Self-pay | Admitting: Physician Assistant

## 2017-10-26 DIAGNOSIS — N6320 Unspecified lump in the left breast, unspecified quadrant: Secondary | ICD-10-CM

## 2017-12-26 ENCOUNTER — Telehealth: Payer: Self-pay | Admitting: Internal Medicine

## 2017-12-26 NOTE — Telephone Encounter (Signed)
Patient calling wanting to schedule colon but it looks like it was changed from 08/2017 to 07/2019 but patient wants to make sure that Dr. Carlean Purl wants her to wait 5 yrs instead of 3

## 2017-12-26 NOTE — Telephone Encounter (Signed)
Letter was mailed to her on 8/19 is she due for colon now or 2021?

## 2017-12-27 NOTE — Telephone Encounter (Signed)
She is due for a colonoscopy now  I cannot see anywhere that I changed that but possible, if so was a mistake

## 2018-01-04 ENCOUNTER — Encounter: Payer: Self-pay | Admitting: Internal Medicine

## 2018-01-04 ENCOUNTER — Ambulatory Visit: Payer: Medicare Other | Admitting: Internal Medicine

## 2018-01-04 VITALS — BP 142/80 | HR 68 | Ht 61.02 in | Wt 175.0 lb

## 2018-01-04 DIAGNOSIS — E039 Hypothyroidism, unspecified: Secondary | ICD-10-CM

## 2018-01-04 MED ORDER — LEVOTHYROXINE SODIUM 112 MCG PO TABS
112.0000 ug | ORAL_TABLET | Freq: Every day | ORAL | 3 refills | Status: DC
Start: 2018-01-04 — End: 2018-12-20

## 2018-01-04 MED ORDER — LIOTHYRONINE SODIUM 5 MCG PO TABS: ORAL_TABLET | ORAL | 3 refills | Status: DC

## 2018-01-04 NOTE — Patient Instructions (Addendum)
Please continue  Levothyroxine 112 mcg and Liothyronine 5 mcg daily.  Take the thyroid hormone every day, with water, at least 30 minutes before breakfast, separated by at least 4 hours from: - acid reflux medications - calcium - iron - multivitamins  Please come back for labs in June.  Please return in 1 year.

## 2018-01-04 NOTE — Progress Notes (Signed)
Patient ID: [name], female   DOB: 19-Aug-1945, 72 y.o.   MRN: [id number]   HPI  Mr. [name] is a 72 y.o.-year-old female, returning for f/u for hypothyroidism. Last visit 1 year ago.  At last visit, she was having knee pain and started to exercise with a personal trainer.  This improved her pain.  She is now doing chair yoga.  She has been diagnosed with hypothyroidism "decades" ago.  She is on both liothyronine and levothyroxine.  She takes LT4 112 mcg and LT3 5 mcg daily (equivalent of 132 mcg of LT4): - in am - fasting - at least 1 hour from b'fast - + Ca at lunchtime - no Fe, MVI, PPIs - not on Biotin  Reviewed patient's TFTs-excellent: Lab Results  Component Value Date   TSH 1.240 08/14/2017   TSH 1.230 09/01/2016   TSH 1.75 11/13/2015   TSH 1.34 04/14/2015   TSH 3.549 02/12/2015   TSH 5.529 (H) 12/02/2014   TSH 2.161 05/30/2014   TSH 1.906 08/18/2013   TSH 2.830 04/30/2013   TSH 1.762 10/05/2012   FREET4 1.82 (H) 08/14/2017   FREET4 1.75 09/01/2016   FREET4 1.6 11/13/2015   FREET4 1.37 04/14/2015   FREET4 1.71 04/30/2013   FREET4 1.45 10/05/2012   FREET4 1.39 08/16/2012   She complains of poor memory even when the TSH is at the upper limit of normal.  She felt much better after the last increasing dose in 02/2015.  Her tests were very stable afterwards.  She is still not sleeping well - wakes up early - last night: 3:30 am.  Pt denies: - feeling nodules in neck - hoarseness - dysphagia - choking - SOB with lying down  She has no FH of thyroid disorders. No FH of thyroid cancer. No h/o radiation tx to head or neck.  No herbal supplements. No Biotin use. No recent steroids use.   Off Carvedilol.  ROS: Constitutional: no weight gain/weight loss, no fatigue, no subjective hyperthermia, no subjective hypothermia Eyes: no blurry vision, no xerophthalmia ENT: no sore throat, + see HPI Cardiovascular: no CP/no SOB/no palpitations/no leg  swelling Respiratory: no cough/no SOB/no wheezing Gastrointestinal: no N/no V/no D/no C/no acid reflux Musculoskeletal: no muscle aches/no joint aches Skin: no rashes, no hair loss Neurological: no tremors/no numbness/no tingling/no dizziness  I reviewed pt's medications, allergies, PMH, social hx, family hx, and changes were documented in the history of present illness. Otherwise, unchanged from my initial visit note.   Past Medical History:  Diagnosis Date  . Abnormal perimenopausal bleeding 2005  . Allergy   . Anemia   . Anxiety   . Dysuria 2004  . Endometrial polyp 2006  . Environmental allergies   . Fibroid 02/19/03  . H/O varicella   . History of measles, mumps, or rubella   . Hx of colonic polyps 12/15/2010  . Hypertension 07-19-2010   echo for pre-op EF 55%  normal left wall thickness LA mildly dialated with mitral and tricuspid regurgatation  . Hypothyroidism   . Interstitial cystitis   . Menopausal symptoms 2004  . Obesity   . Osteopenia 02/2012   -1.8 T score right femur neck  . PMB (postmenopausal bleeding) 06/21/10  . Sleep apnea   . Urinary incontinence 2011  . Vitamin D deficiency    history of   Past Surgical History:  Procedure Laterality Date  . COLONOSCOPY  12/15/10  . HYSTEROSCOPY  2006 and May 2012   for fibroids, endometrial polyps  .  MOUTH SURGERY    . MYOMECTOMY  1974   Social History   Social History  . Marital Status: Divorced    Spouse Name: N/A  . Number of Children: 0   Occupational History  . 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 Smoking status: Never Smoker   . Smokeless tobacco: Never Used  . Alcohol Use: 0.6 oz/week    1 Glasses of wine per week     Comment: occasional (1-2 times per week)  . Drug Use: No   Social History Narrative   Divorced. Education: [employer]. Exercise: Yoga 2 times a week.   Current Outpatient Medications on File Prior to Visit  Medication Sig Dispense Refill   . acetaminophen (TYLENOL) 500 MG tablet Take 1,000 mg by mouth every 6 (six) hours as needed for moderate pain (knee pain).     . benazepril-hydrochlorthiazide (LOTENSIN HCT) 20-12.5 MG tablet TAKE ONE TABLET BY MOUTH TWICE A DAY FOR FOR BLOOD PRESSURE (Patient taking differently: 2 tablets daily with lunch. TAKE ONE TABLET BY MOUTH TWICE A DAY FOR FOR BLOOD PRESSURE) 180 tablet 3  . hydrALAZINE (APRESOLINE) 25 MG tablet TAKE TWO TABLETS BY MOUTH IN THE MORNING, THEN ONE TABLET BY MOUTH AT LUNCH, THEN TWO TABLETS BY MOUTH AT DINNER FOR BLOOD PRESSURE 450 tablet 3  . levothyroxine (SYNTHROID, LEVOTHROID) 112 MCG tablet Take 1 tablet (112 mcg total) by mouth daily before breakfast. 90 tablet 3  . liothyronine (CYTOMEL) 5 MCG tablet TAKE 1 TABLET BY MOUTH DAILY FOR THYROID 90 tablet 3  . triamterene-hydrochlorothiazide (MAXZIDE-25) 37.5-25 MG tablet Take 1 tablet by mouth daily.    . Vitamin D, Ergocalciferol, (DRISDOL) 50000 units CAPS capsule Take 1 capsule (50,000 Units total) by mouth every 7 (seven) days. (Patient not taking: Reported on 01/04/2018) 12 capsule 0   No current facility-administered medications on file prior to visit.    Allergies  Allergen Reactions  . Bactrim [Sulfamethoxazole-Trimethoprim]   . Epinephrine     shakey  . Erythromycin   . Sulfa Antibiotics Rash   Family History  Problem Relation Age of Onset  . Kidney disease Father        from uremia  . Benign prostatic hyperplasia Father   . Hypertension Mother   . Colon cancer Paternal Uncle   . Clotting disorder Maternal Grandmother        ????   PE: BP (!) 142/80   Pulse 68   Ht 5' 1.02" (1.55 m)   Wt 175 lb (79.4 kg)   SpO2 98%   BMI 33.04 kg/m  Wt Readings from Last 3 Encounters:  01/04/18 175 lb (79.4 kg)  08/14/17 179 lb 3.2 oz (81.3 kg)  07/02/17 181 lb 6.4 oz (82.3 kg)   Constitutional: overweight, in NAD Eyes: PERRLA, EOMI, no exophthalmos ENT: moist mucous membranes, no thyromegaly, no cervical  lymphadenopathy Cardiovascular: RRR, No MRG Respiratory: CTA B Gastrointestinal: abdomen soft, NT, ND, BS+ Musculoskeletal: no deformities, strength intact in all 4 Skin: moist, warm, no rashes Neurological: no tremor with outstretched hands, DTR normal in all 4  ASSESSMENT: 1. Hypothyroidism  PLAN:  1. Patient with long-standing hypothyroidism, on levothyroxine and liothyronine therapy.  She appears euthyroid.  She complains of memory loss when TSH heads towards the upper limit of normal.  As of now, she feels that her memory is good and is not having complaints other than still not being able to sleep well. She lost 6 pounds  since last visit. - latest thyroid labs reviewed with pt >> normal 08/2017 - she continues on LT4 112 + LT3 5 mcg daily (equivalent dose of 132 LT4 mcg daily) - pt feels good on this dose.   - we discussed about taking the thyroid hormone every day, with water, >30 minutes before breakfast, separated by >4 hours from acid reflux medications, calcium, iron, multivitamins. Pt. is taking it correctly. - She would like to defer TFTs check today for a year from the previous, in 08/2018. - I ordered the labs for then and advised her to schedule an appointment to come here and have them checked.  If she establishes care with a new PCP (previous PCP left practice), she can have them at her annual physical exam. - Refilled her prescriptions. - refuses flu shot >> will get this at [facility] in 1 year  - time spent with the patient: 15 minutes, of which >50% was spent in obtaining information about her symptoms, reviewing her previous labs, evaluations, and treatments, counseling her about her condition (please see the discussed topics above), and developing a plan to further investigate and treat it.  Orders Placed This Encounter  Procedures  . TSH  . T4, free    [name], MD PhD [facility] Endocrinology

## 2018-01-09 ENCOUNTER — Encounter (HOSPITAL_COMMUNITY): Payer: Self-pay

## 2018-01-09 ENCOUNTER — Emergency Department (HOSPITAL_COMMUNITY)
Admission: EM | Admit: 2018-01-09 | Discharge: 2018-01-10 | Disposition: A | Discharge status: Home / Self Care | Payer: Medicare Other | Attending: Emergency Medicine | Admitting: Emergency Medicine

## 2018-01-09 DIAGNOSIS — T50901A Poisoning by unspecified drugs, medicaments and biological substances, accidental (unintentional), initial encounter: Secondary | ICD-10-CM

## 2018-01-09 DIAGNOSIS — I10 Essential (primary) hypertension: Secondary | ICD-10-CM | POA: Diagnosis not present

## 2018-01-09 DIAGNOSIS — F419 Anxiety disorder, unspecified: Secondary | ICD-10-CM | POA: Insufficient documentation

## 2018-01-09 DIAGNOSIS — T465X1A Poisoning by other antihypertensive drugs, accidental (unintentional), initial encounter: Secondary | ICD-10-CM | POA: Diagnosis not present

## 2018-01-09 DIAGNOSIS — E039 Hypothyroidism, unspecified: Secondary | ICD-10-CM | POA: Diagnosis not present

## 2018-01-09 DIAGNOSIS — Z79899 Other long term (current) drug therapy: Secondary | ICD-10-CM | POA: Diagnosis not present

## 2018-01-09 NOTE — ED Triage Notes (Signed)
Pt states that she was tired this evening and thinks she may have taken two of her diuretic  pills by accident, pt is unsure if she took an extra dose, or if she took morning meds, or is she took anything at all, denies SI/HI, denies other complaints

## 2018-01-09 NOTE — ED Provider Notes (Signed)
[facility] EMERGENCY DEPARTMENT Provider Note   CSN: [id number] Arrival date & time: 01/09/18  2324     History   Chief Complaint Chief Complaint  Patient presents with  . Drug Overdose    HPI [name] is a 72 y.o. female.  The history is provided by the patient.  She has history of hypertension, hypothyroidism, interstitial cystitis and anxiety and comes in because she may have taken an extra dose of her benazepril-hydrochlorothiazide 20-12.5 (two tablets). Her main concern right now is that she is very anxious.  Of note, she does not monitor her blood pressure at home.  Past Medical History:  Diagnosis Date  . Abnormal perimenopausal bleeding 2005  . Allergy   . Anemia   . Anxiety   . Dysuria 2004  . Endometrial polyp 2006  . Environmental allergies   . Fibroid 02/19/03  . H/O varicella   . History of measles, mumps, or rubella   . Hx of colonic polyps 12/15/2010  . Hypertension 07-19-2010   echo for pre-op EF 55%  normal left wall thickness LA mildly dialated with mitral and tricuspid regurgatation  . Hypothyroidism   . Interstitial cystitis   . Menopausal symptoms 2004  . Obesity   . Osteopenia 02/2012   -1.8 T score right femur neck  . PMB (postmenopausal bleeding) 06/21/10  . Sleep apnea   . Urinary incontinence 2011  . Vitamin D deficiency    history of    Patient Active Problem List   Diagnosis Date Noted  . Obstructive sleep apnea treated with continuous positive airway pressure (CPAP) 07/02/2017  . Osteopenia 09/01/2016  . Urinary incontinence in female 09/01/2016  . Vitamin D deficiency 09/01/2016  . BMI 35.0-35.9,adult 11/16/2015  . Essential hypertension, benign 04/28/2012  . Osteoarthritis of left knee 04/28/2012  . Hypothyroidism 04/28/2012  . Hx of colonic polyps 12/15/2010  . Anxiety 11/17/2010    Past Surgical History:  Procedure Laterality Date  . COLONOSCOPY  12/15/10  . HYSTEROSCOPY  2006 and May 2012     for fibroids, endometrial polyps  . MOUTH SURGERY    . MYOMECTOMY  1974     OB History   None      Home Medications    Prior to Admission medications   Medication Sig Start Date End Date Taking? Authorizing Provider  acetaminophen (TYLENOL) 500 MG tablet Take 1,000 mg by mouth every 6 (six) hours as needed for moderate pain (knee pain).     [provider]  benazepril-hydrochlorthiazide (LOTENSIN HCT) 20-12.5 MG tablet TAKE ONE TABLET BY MOUTH TWICE A DAY FOR FOR BLOOD PRESSURE Patient taking differently: 2 tablets daily with lunch. TAKE ONE TABLET BY MOUTH TWICE A DAY FOR FOR BLOOD PRESSURE 02/07/17   Horald Pollen, MD  hydrALAZINE (APRESOLINE) 25 MG tablet TAKE TWO TABLETS BY MOUTH IN THE MORNING, THEN ONE TABLET BY MOUTH AT LUNCH, THEN TWO TABLETS BY MOUTH AT [facility] FOR BLOOD PRESSURE 02/07/17   Horald Pollen, MD  levothyroxine (SYNTHROID, LEVOTHROID) 112 MCG tablet Take 1 tablet (112 mcg total) by mouth daily before breakfast. 01/04/18   [name], MD  liothyronine (CYTOMEL) 5 MCG tablet TAKE 1 TABLET BY MOUTH DAILY FOR THYROID 01/04/18   [name], MD  triamterene-hydrochlorothiazide (MAXZIDE-25) 37.5-25 MG tablet Take 1 tablet by mouth daily.    [provider]  Vitamin D, Ergocalciferol, (DRISDOL) 50000 units CAPS capsule Take 1 capsule (50,000 Units total) by mouth every 7 (seven) days.  Patient not taking: Reported on 01/04/2018 08/17/17   Mancel Bale, PA-C    Family History Family History  Problem Relation Age of Onset  . Kidney disease Father        from uremia  . Benign prostatic hyperplasia Father   . Hypertension Mother   . Colon cancer Paternal Uncle   . Clotting disorder Maternal Grandmother        ????    Social History Social History   Tobacco Use  . Smoking status: Never Smoker  . Smokeless tobacco: Never Used  Substance Use Topics  . Alcohol use: Yes    Alcohol/week: 1.0 standard drinks    Types: 1  Glasses of wine per week    Comment: occasional (1-2 times per week)  . Drug use: No     Allergies   Bactrim [sulfamethoxazole-trimethoprim]; Epinephrine; Erythromycin; and Sulfa antibiotics   Review of Systems Review of Systems  All other systems reviewed and are negative.    Physical Exam Updated Vital Signs BP (!) 194/83   Pulse (!) 59   Temp 97.7 F (36.5 C) (Oral)   Resp 20   SpO2 100%   Physical Exam  Nursing note and vitals reviewed.  72 year old female, resting comfortably and in no acute distress. Vital signs are significant for elevated systolic blood pressure. Oxygen saturation is 100%, which is normal. Head is normocephalic and atraumatic. PERRLA, EOMI. Oropharynx is clear. Neck is nontender and supple without adenopathy or JVD. Back is nontender and there is no CVA tenderness. Lungs are clear without rales, wheezes, or rhonchi. Chest is nontender. Heart has regular rate and rhythm without murmur. Abdomen is soft, flat, nontender without masses or hepatosplenomegaly and peristalsis is normoactive. Extremities have no cyanosis or edema, full range of motion is present. Skin is warm and dry without rash. Neurologic: Mental status is normal, cranial nerves are intact, there are no motor or sensory deficits.  ED Treatments / Results   Procedures Procedures   Medications Ordered in ED Medications - No data to display   Initial Impression / Assessment and Plan / ED Course  I have reviewed the triage vital signs and the nursing notes.  Possible accidental overdose of benazepril-hydrochlorothiazide.  If she actually took an extra dose, it is a nontoxic dose and needs no treatment.  Systolic blood pressure is elevated here and this is likely related to her anxiety level.  She is reassured that no intervention is needed at this time, and she is encouraged to monitor her blood pressure at home.  Old records are reviewed, and recent office visit had normal blood  pressure.  Final Clinical Impressions(s) / ED Diagnoses   Final diagnoses:  Accidental drug overdose, initial encounter  Elevated blood pressure reading with diagnosis of hypertension    ED Discharge Orders    None       Delora Fuel, MD 27/25/36 0008

## 2018-01-09 NOTE — Discharge Instructions (Addendum)
Take your medication as prescribed.  Please check your blood pressure once a day. Keep a record of it, and take that record with you when you see your primary care provider.

## 2018-02-10 ENCOUNTER — Other Ambulatory Visit: Payer: Self-pay | Admitting: Emergency Medicine

## 2018-02-10 DIAGNOSIS — I10 Essential (primary) hypertension: Secondary | ICD-10-CM

## 2018-02-12 ENCOUNTER — Telehealth: Payer: Self-pay | Admitting: Emergency Medicine

## 2018-02-12 NOTE — Telephone Encounter (Signed)
Pt's last office visit 08/14/17 with Windell Hummingbird; no upcoming visits noted; contacted pt to schedule transfer of care visit; she requests a female provider; pt offered and accepted appointment with Dr Grant Fontana, [address] [address], 03/15/17 at 1020; pt also instructed to bring BP cuff and readings to this appointment; she verbalized understanding.

## 2018-02-12 NOTE — Telephone Encounter (Signed)
Requested Prescriptions  Pending Prescriptions Disp Refills  . benazepril-hydrochlorthiazide (LOTENSIN HCT) 20-12.5 MG tablet [Pharmacy Med Name: BENAZEPRIL-HCTZ 20-12.5 MG TAB] 180 tablet 2    Sig: TAKE ONE TABLET BY MOUTH TWICE A DAY FOR BLOOD PRESSURE     Cardiovascular:  ACEI + Diuretic Combos Failed - 02/10/2018 11:24 AM      Failed - Na in normal range and within 180 days    Sodium  Date Value Ref Range Status  07/31/2017 142 135 - 145 mmol/L Final  09/01/2016 142 134 - 144 mmol/L Final         Failed - K in normal range and within 180 days    Potassium  Date Value Ref Range Status  07/31/2017 3.7 3.5 - 5.1 mmol/L Final         Failed - Cr in normal range and within 180 days    Creat  Date Value Ref Range Status  11/13/2015 0.70 0.60 - 0.93 mg/dL Final    Comment:      For patients > or = 73 years of age: The upper reference limit for Creatinine is approximately 13% higher for people identified as African-American.      Creatinine, Ser  Date Value Ref Range Status  07/31/2017 0.71 0.44 - 1.00 mg/dL Final         Failed - Ca in normal range and within 180 days    Calcium  Date Value Ref Range Status  07/31/2017 9.2 8.9 - 10.3 mg/dL Final         Failed - Last BP in normal range    BP Readings from Last 1 Encounters:  01/10/18 (!) 192/70         Passed - Patient is not pregnant      Passed - Valid encounter within last 6 months    Recent Outpatient Visits          6 months ago Medicare annual wellness visit, subsequent   [facility] [place], PA-C   1 year ago Essential hypertension, benign   [facility] [place], [place], MD   1 year ago Arthralgia of left lower leg   [facility], Ranell Patrick, MD   1 year ago Vertigo   [facility] [place], [place]   1 year ago Dizziness   [facility] [place], [place]

## 2018-03-15 ENCOUNTER — Ambulatory Visit: Payer: Self-pay | Admitting: Family Medicine

## 2018-03-28 ENCOUNTER — Other Ambulatory Visit: Payer: Self-pay

## 2018-03-28 ENCOUNTER — Encounter: Payer: Self-pay | Admitting: Family Medicine

## 2018-03-28 ENCOUNTER — Ambulatory Visit: Payer: Medicare Other | Admitting: Family Medicine

## 2018-03-28 VITALS — BP 158/64 | HR 73 | Temp 98.1°F | Ht 61.0 in | Wt 179.0 lb

## 2018-03-28 DIAGNOSIS — I10 Essential (primary) hypertension: Secondary | ICD-10-CM | POA: Diagnosis not present

## 2018-03-28 DIAGNOSIS — E559 Vitamin D deficiency, unspecified: Secondary | ICD-10-CM

## 2018-03-28 DIAGNOSIS — E039 Hypothyroidism, unspecified: Secondary | ICD-10-CM | POA: Diagnosis not present

## 2018-03-28 NOTE — Progress Notes (Signed)
1/23/202011:16 AM  [name] Dec 17, 1945, 73 y.o. female [id number]  Chief Complaint  Patient presents with  . Transitions Of Care  . Hypertension    HPI:   Patient is a 73 y.o. female with past medical history significant for HTN, anxiety, interstitial cystitis, vitamin D deficiency, OSA on CPAP and hypothyroidism who presents today to establish care  Previous PCP [place], PA-C Last OV June 2019  Checking her BP causes her significant anxiety Readings at home similar to todays Has only taken hydralazine this morning dose BP meds Has not taken benzapril this morning which she takes around 1pm, takes 2 tabs at once.  Sometimes struggles with TID dosing Amlodipine causes ankle swelling Coreg made her bradycardic Her BP cuff was compared today and it is accurate Claims BP only goes up when she is really stressed or nervous - not interested in pursuing treatment Looking forward to returning back to work, thinks this will help her BP  Usually does not sleep well, due to waking up for bladder issues but uses cpap every night   Requesting thyroid levels be checked due to fatigue Sees Dr Letta Median for her hypothyroidism, once a year    Fall Risk  03/28/2018 08/14/2017 02/07/2017 01/18/2017 09/01/2016  Falls in the past year? 0 Yes No No No  Number falls in past yr: - 1 - - -  Injury with Fall? - No - - -     Depression screen [facility] 2/9 08/14/2017 02/07/2017 01/18/2017  Decreased Interest 3 0 0  Down, Depressed, Hopeless 3 0 0  PHQ - 2 Score 6 0 0  Altered sleeping 3 - -  Tired, decreased energy 3 - -  Change in appetite 1 - -  Feeling bad or failure about yourself  0 - -  Trouble concentrating 1 - -  Moving slowly or fidgety/restless 0 - -  Suicidal thoughts 0 - -  PHQ-9 Score 14 - -    Allergies  Allergen Reactions  . Bactrim [Sulfamethoxazole-Trimethoprim]   . Epinephrine     shakey  . Erythromycin   . Sulfa Antibiotics Rash    Prior to Admission medications     Medication Sig Start Date End Date Taking? Authorizing Provider  benazepril-hydrochlorthiazide (LOTENSIN HCT) 20-12.5 MG tablet TAKE ONE TABLET BY MOUTH TWICE A DAY FOR BLOOD PRESSURE 02/12/18  Yes Sagardia, Ines Bloomer, MD  Cholecalciferol (VITAMIN D3) 50 MCG (2000 UT) TABS Vitamin D3 50 mcg (2,000 unit) capsule  Take by oral route.   Yes [provider]  hydrALAZINE (APRESOLINE) 25 MG tablet TAKE TWO TABLETS BY MOUTH IN THE MORNING, THEN ONE TABLET BY MOUTH AT LUNCH, THEN TWO TABLETS BY MOUTH AT DINNER FOR BLOOD PRESSURE 02/07/17  Yes Sagardia, Ines Bloomer, MD  levothyroxine (SYNTHROID, LEVOTHROID) 112 MCG tablet Take 1 tablet (112 mcg total) by mouth daily before breakfast. 01/04/18  Yes [name], MD  liothyronine (CYTOMEL) 5 MCG tablet TAKE 1 TABLET BY MOUTH DAILY FOR THYROID 01/04/18  Yes [name], MD    Past Medical History:  Diagnosis Date  . Abnormal perimenopausal bleeding 2005  . Allergy   . Anemia   . Anxiety   . Dysuria 2004  . Endometrial polyp 2006  . Environmental allergies   . Fibroid 02/19/03  . H/O varicella   . History of measles, mumps, or rubella   . Hx of colonic polyps 12/15/2010  . Hypertension 07-19-2010   echo for pre-op EF 55%  normal left wall thickness LA  mildly dialated with mitral and tricuspid regurgatation  . Hypothyroidism   . Interstitial cystitis   . Menopausal symptoms 2004  . Obesity   . Osteopenia 02/2012   -1.8 T score right femur neck  . PMB (postmenopausal bleeding) 06/21/10  . Sleep apnea   . Urinary incontinence 2011  . Vitamin D deficiency    history of    Past Surgical History:  Procedure Laterality Date  . COLONOSCOPY  12/15/10  . HYSTEROSCOPY  2006 and May 2012   for fibroids, endometrial polyps  . MOUTH SURGERY    . MYOMECTOMY  1974    Social History   Tobacco Use  . Smoking status: Never Smoker  . Smokeless tobacco: Never Used  Substance Use Topics  . Alcohol use: Yes    Alcohol/week: 1.0  standard drinks    Types: 1 Glasses of wine per week    Comment: occasional (1-2 times per week)    Family History  Problem Relation Age of Onset  . Kidney disease Father        from uremia  . Benign prostatic hyperplasia Father   . Hypertension Mother   . Colon cancer Paternal Uncle   . Clotting disorder Maternal Grandmother        ????    Review of Systems  Constitutional: Negative for chills and fever.  Respiratory: Negative for cough and shortness of breath.   Cardiovascular: Negative for chest pain, palpitations and leg swelling.  Gastrointestinal: Negative for abdominal pain, nausea and vomiting.     OBJECTIVE:  Blood pressure (!) 170/70, pulse 73, temperature 98.1 F (36.7 C), temperature source Oral, height 5\' 1"  (1.549 m), weight 179 lb (81.2 kg), SpO2 98 %. Body mass index is 33.82 kg/m.   BP Readings from Last 3 Encounters:  03/28/18 (!) 170/70  01/10/18 (!) 192/70  01/04/18 (!) 142/80    Physical Exam Vitals signs and nursing note reviewed.  Constitutional:      Appearance: She is well-developed.  HENT:     Head: Normocephalic and atraumatic.     Mouth/Throat:     Pharynx: No oropharyngeal exudate.  Eyes:     General: No scleral icterus.    Conjunctiva/sclera: Conjunctivae normal.     Pupils: Pupils are equal, round, and reactive to light.  Neck:     Musculoskeletal: Neck supple.  Cardiovascular:     Rate and Rhythm: Normal rate and regular rhythm.     Heart sounds: Normal heart sounds. No murmur. No friction rub. No gallop.   Pulmonary:     Effort: Pulmonary effort is normal.     Breath sounds: Normal breath sounds. No wheezing or rales.  Skin:    General: Skin is warm and dry.  Neurological:     Mental Status: She is alert and oriented to person, place, and time.    ASSESSMENT and PLAN  1. Essential hypertension Uncontrolled, patient weary of further treating at this point. Discussed daily home BP monitoring in the evening. Bring log  back to next visit. ER precautions given. - Comprehensive metabolic panel  2. Vitamin D deficiency Checking labs today, medications will be adjusted as needed.  - Vitamin D, 25-hydroxy  3. Acquired hypothyroidism Managed by endo, checking labs per patient's request - TSH - T4, Free  Other orders - Cholecalciferol (VITAMIN D3) 50 MCG (2000 UT) TABS; Vitamin D3 50 mcg (2,000 unit) capsule  Take by oral route.  Return in about 2 weeks (around 04/11/2018) for BP.  Gussie Towson M Santiago, MD [facility] [address] [place], [place] [address] Ph.  [phone] Fax [phone]   

## 2018-03-28 NOTE — Patient Instructions (Addendum)
  Check BP at home in the afternoon, evening    If you have lab work done today you will be contacted with your lab results within the next 2 weeks.  If you have not heard from [place] then please contact us. The fastest way to get your results is to register for My Chart.   IF you received an x-ray today, you will receive an invoice from [facility]. Please contact [facility] at [phone] with questions or concerns regarding your invoice.   IF you received labwork today, you will receive an invoice from [facility]. Please contact [facility] at [phone] with questions or concerns regarding your invoice.   Our billing staff will not be able to assist you with questions regarding bills from these companies.  You will be contacted with the lab results as soon as they are available. The fastest way to get your results is to activate your My Chart account. Instructions are located on the last page of this paperwork. If you have not heard from [place] regarding the results in 2 weeks, please contact this office.

## 2018-03-29 LAB — COMPREHENSIVE METABOLIC PANEL
ALT: 16 IU/L (ref 0–32)
AST: 15 IU/L (ref 0–40)
Albumin/Globulin Ratio: 1.8 (ref 1.2–2.2)
Albumin: 4.2 g/dL (ref 3.7–4.7)
Alkaline Phosphatase: 57 IU/L (ref 39–117)
BUN/Creatinine Ratio: 22 (ref 12–28)
BUN: 15 mg/dL (ref 8–27)
Bilirubin Total: 0.3 mg/dL (ref 0.0–1.2)
CO2: 23 mmol/L (ref 20–29)
Calcium: 9.6 mg/dL (ref 8.7–10.3)
Chloride: 101 mmol/L (ref 96–106)
Creatinine, Ser: 0.67 mg/dL (ref 0.57–1.00)
GFR calc Af Amer: 101 mL/min/{1.73_m2} (ref >59)
GFR calc non Af Amer: 87 mL/min/{1.73_m2} (ref >59)
Globulin, Total: 2.3 g/dL (ref 1.5–4.5)
Glucose: 94 mg/dL (ref 65–99)
Potassium: 3.8 mmol/L (ref 3.5–5.2)
Sodium: 142 mmol/L (ref 134–144)
Total Protein: 6.5 g/dL (ref 6.0–8.5)

## 2018-03-29 LAB — TSH: TSH: 0.963 u[IU]/mL (ref 0.450–4.500)

## 2018-03-29 LAB — T4, FREE: Free T4: 1.67 ng/dL (ref 0.82–1.77)

## 2018-03-29 LAB — VITAMIN D 25 HYDROXY (VIT D DEFICIENCY, FRACTURES): Vit D, 25-Hydroxy: 28.2 ng/mL — ABNORMAL LOW (ref 30.0–100.0)

## 2018-04-11 ENCOUNTER — Telehealth: Payer: Self-pay | Admitting: Family Medicine

## 2018-04-11 ENCOUNTER — Other Ambulatory Visit: Payer: Self-pay | Admitting: Emergency Medicine

## 2018-04-11 DIAGNOSIS — I10 Essential (primary) hypertension: Secondary | ICD-10-CM

## 2018-04-11 NOTE — Telephone Encounter (Signed)
Copied from [place] [phone]. Topic: Quick Communication - See Telephone Encounter >> Apr 11, 2018  4:45 PM Rutherford Nail, NT wrote: CRM for notification. See Telephone encounter for: 04/11/18. Patient calling and states taht she is supposed to have a 2 week follow uop for blood pressure. States she was supposed to take down her BP readings and bring them to the office. States that they have been running a little elevated because she is unable to relax and is stressed when she goes to take her BP. States that she had taken it today and it was 125/71 because she was able to relax herself before taking it. Would like to know if it is possible to take her readings for another 2 weeks with her relaxing before taking it, and then schedule a follow up? If so, she would need a prescription for the medication because she will run out. CB#: [phone]

## 2018-04-11 NOTE — Telephone Encounter (Signed)
Requested medication (s) are due for refill today: yes  Requested medication (s) are on the active medication list: yes    Last refill: 02/07/17  #450    3 refills  Future visit scheduled no  Notes to clinic:  Pt is to call PCP with BP readings, determine med refill  Requested Prescriptions  Pending Prescriptions Disp Refills   hydrALAZINE (APRESOLINE) 25 MG tablet [Pharmacy Med Name: hydrALAZINE 25 MG TABLET] 450 tablet 2    Sig: TAKE TWO TABLETS BY MOUTH IN THE MORNING, THEN ONE TABLET BY MOUTH AT LUNCH, THEN TWO TABLETS BY MOUTH AT [place] BLOOD PRESSURE     Cardiovascular:  Vasodilators Failed - 04/11/2018  6:36 PM      Failed - RBC in normal range and within 360 days    RBC  Date Value Ref Range Status  07/31/2017 5.44 (H) 3.87 - 5.11 MIL/uL Final         Failed - Last BP in normal range    BP Readings from Last 1 Encounters:  03/28/18 (!) 158/64         Passed - HCT in normal range and within 360 days    HCT  Date Value Ref Range Status  07/31/2017 45.0 36.0 - 46.0 % Final   Hematocrit  Date Value Ref Range Status  09/01/2016 39.7 34.0 - 46.6 % Final         Passed - HGB in normal range and within 360 days    Hemoglobin  Date Value Ref Range Status  07/31/2017 14.6 12.0 - 15.0 g/dL Final  09/01/2016 13.3 11.1 - 15.9 g/dL Final         Passed - WBC in normal range and within 360 days    WBC  Date Value Ref Range Status  07/31/2017 8.4 4.0 - 10.5 K/uL Final         Passed - PLT in normal range and within 360 days    Platelets  Date Value Ref Range Status  07/31/2017 268 150 - 400 K/uL Final    Comment:    Performed at [facility], [place] [address], [place], [place] [address]  09/01/2016 231 150 - 379 x10E3/uL Final   Platelet Count, POC  Date Value Ref Range Status  03/15/2016 243 142 - 424 K/uL Final         Passed - Valid encounter within last 12 months    Recent Outpatient Visits          2 weeks ago Essential hypertension   [facility], Lilia Argue, MD   8 months ago Medicare annual wellness visit, subsequent   [facility] at [place], PA-C   1 year ago Essential hypertension, benign   [facility] at [facility], Ines Bloomer, MD   1 year ago Arthralgia of left lower leg   [facility], Ranell Patrick, MD   2 years ago Vertigo   [facility] Care at [place], [place]

## 2018-04-23 NOTE — Telephone Encounter (Signed)
Call to pt.  Refill of meds already sent in by someone else.  Pt would like to wait three months for f/u due to home BP readings coming down and refill sent.  Advised that Dr. Pamella Pert wanted to see her already so I recommend she go ahead and come in within the month.  Placed on hold for scheduling.

## 2018-05-17 ENCOUNTER — Encounter: Payer: Self-pay | Admitting: Family Medicine

## 2018-05-31 ENCOUNTER — Ambulatory Visit: Payer: Medicare Other | Admitting: Family Medicine

## 2018-06-21 ENCOUNTER — Encounter: Payer: Self-pay | Admitting: Family Medicine

## 2018-06-26 ENCOUNTER — Other Ambulatory Visit: Payer: Self-pay

## 2018-06-26 ENCOUNTER — Encounter: Payer: Self-pay | Admitting: Family Medicine

## 2018-06-26 ENCOUNTER — Telehealth (INDEPENDENT_AMBULATORY_CARE_PROVIDER_SITE_OTHER): Payer: Medicare Other | Admitting: Family Medicine

## 2018-06-26 DIAGNOSIS — I10 Essential (primary) hypertension: Secondary | ICD-10-CM

## 2018-06-26 MED ORDER — HYDRALAZINE HCL 25 MG PO TABS: ORAL_TABLET | ORAL | 2 refills | Status: DC

## 2018-06-26 MED ORDER — ATENOLOL 25 MG PO TABS: 25.0000 mg | ORAL_TABLET | Freq: Every day | ORAL | 1 refills | Status: DC

## 2018-06-26 MED ORDER — BENAZEPRIL-HYDROCHLOROTHIAZIDE 20-12.5 MG PO TABS: 2.0000 | ORAL_TABLET | Freq: Every day | ORAL | 1 refills | Status: DC

## 2018-06-26 NOTE — Progress Notes (Signed)
Virtual Visit via telephone Note  I connected with patient on 06/26/18 at 240pm by telephone and verified that I am speaking with the correct person using two identifiers. [name] is currently located at home and patient is currently with her during visit. The provider, [name], MD is located in their office at time of visit.  I discussed the limitations, risks, security and privacy concerns of performing an evaluation and management service by telephone and the availability of in person appointments. I also discussed with the patient that there may be a patient responsible charge related to this service. The patient expressed understanding and agreed to proceed.  Telephone visit today for BP followup  HPI  Patient is a 73 y.o. female with past medical history significant for HTN, anxiety, interstitial cystitis, vitamin D deficiency, OSA on CPAP and hypothyroidism who presents for followup on her BP  Takes benzapril-hctz taking 2 tabs once a day AM hydralazine 25mg : 2 tabs AM, 1 tab lunch, 2 tabs PM Home BP readings, taken at home after evening meds  3/22  152/83  3/23   183/95  3/24   137/91  3/25   122/72  3/26   136/73  3/30   143/80  4/1    155/80  4/2    152/86  4/3    164/84  4/4    148/81  4/6    147/78  4/10   148/77  4/11   168/79  4/12   164/75  4/14   160/83  4/15   150/83  Reports HR in the 80s? Currently not working for past 2 weeks Last checked today after lunch 137/76 Has tried norvasc in the past, made her swell Decreased afternoon hydralazine to 1 tab due to dizziness, since then resolved She reports she has lost 5 lbs  Denies any chest pain, SOB, palpitations, edema  hypothyroidism managed by endo  Lab Results  Component Value Date   CREATININE 0.67 03/28/2018   BUN 15 03/28/2018   NA 142 03/28/2018   K 3.8 03/28/2018   CL 101 03/28/2018   CO2 23 03/28/2018   Lab Results  Component  Value Date   TSH 0.963 03/28/2018    Lab Results  Component Value Date   CHOL 185 12/02/2014   HDL 46 12/02/2014   LDLCALC 113 12/02/2014   TRIG 132 12/02/2014   CHOLHDL 4.0 12/02/2014    Fall Risk  06/26/2018 03/28/2018 08/14/2017 02/07/2017 01/18/2017  Falls in the past year? 0 0 Yes No No  Number falls in past yr: - - 1 - -  Injury with Fall? - - No - -     Depression screen [facility] 2/9 06/26/2018 08/14/2017 02/07/2017  Decreased Interest 0 3 0  Down, Depressed, Hopeless 0 3 0  PHQ - 2 Score 0 6 0  Altered sleeping - 3 -  Tired, decreased energy - 3 -  Change in appetite - 1 -  Feeling bad or failure about yourself  - 0 -  Trouble concentrating - 1 -  Moving slowly or fidgety/restless - 0 -  Suicidal thoughts - 0 -  PHQ-9 Score - 14 -    Allergies  Allergen Reactions  . Bactrim [Sulfamethoxazole-Trimethoprim]   . Epinephrine     shakey  . Erythromycin   . Sulfa Antibiotics Rash    Prior to Admission medications   Medication Sig Start Date End Date Taking? Authorizing Provider  benazepril-hydrochlorthiazide (LOTENSIN HCT) 20-12.5 MG tablet TAKE ONE  TABLET BY MOUTH TWICE A DAY FOR BLOOD PRESSURE 04/11/18  Yes Sagardia, Ines Bloomer, MD  Cholecalciferol (VITAMIN D3) 50 MCG (2000 UT) TABS Vitamin D3 50 mcg (2,000 unit) capsule  Take by oral route.   Yes [provider]  hydrALAZINE (APRESOLINE) 25 MG tablet TAKE TWO TABLETS BY MOUTH IN THE MORNING, THEN ONE TABLET BY MOUTH AT LUNCH, THEN TWO TABLETS BY MOUTH AT DINNER FOR FOR BLOOD PRESSURE 04/12/18  Yes Sagardia, Ines Bloomer, MD  levothyroxine (SYNTHROID, LEVOTHROID) 112 MCG tablet Take 1 tablet (112 mcg total) by mouth daily before breakfast. 01/04/18  Yes [name], MD  liothyronine (CYTOMEL) 5 MCG tablet TAKE 1 TABLET BY MOUTH DAILY FOR THYROID 01/04/18  Yes [name], MD    Past Medical History:  Diagnosis Date  . Abnormal perimenopausal bleeding 2005  . Allergy   . Anemia   . Anxiety   . Dysuria  2004  . Endometrial polyp 2006  . Environmental allergies   . Fibroid 02/19/03  . H/O varicella   . History of measles, mumps, or rubella   . Hx of colonic polyps 12/15/2010  . Hypertension 07-19-2010   echo for pre-op EF 55%  normal left wall thickness LA mildly dialated with mitral and tricuspid regurgatation  . Hypothyroidism   . Interstitial cystitis   . Menopausal symptoms 2004  . Obesity   . Osteopenia 02/2012   -1.8 T score right femur neck  . PMB (postmenopausal bleeding) 06/21/10  . Sleep apnea   . Urinary incontinence 2011  . Vitamin D deficiency    history of    Past Surgical History:  Procedure Laterality Date  . COLONOSCOPY  12/15/10  . HYSTEROSCOPY  2006 and May 2012   for fibroids, endometrial polyps  . MOUTH SURGERY    . MYOMECTOMY  1974    Social History   Tobacco Use  . Smoking status: Never Smoker  . Smokeless tobacco: Never Used  Substance Use Topics  . Alcohol use: Yes    Alcohol/week: 1.0 standard drinks    Types: 1 Glasses of wine per week    Comment: occasional (1-2 times per week)    Family History  Problem Relation Age of Onset  . Kidney disease Father        from uremia  . Benign prostatic hyperplasia Father   . Hypertension Mother   . Colon cancer Paternal Uncle   . Clotting disorder Maternal Grandmother        ????    ROS Per hpi  Objective  Vitals as reported by the patient: see above  There were no vitals filed for this visit.  ASSESSMENT and PLAN  1. Essential hypertension, benign Uncontrolled. Adding atenolol. Reviewed new med r/se/b. Cont home monitoring. - benazepril-hydrochlorthiazide (LOTENSIN HCT) 20-12.5 MG tablet; Take 2 tablets by mouth daily. - hydrALAZINE (APRESOLINE) 25 MG tablet; TAKE TWO TABLETS BY MOUTH IN THE MORNING, THEN ONE TABLET BY MOUTH AT LUNCH, THEN TWO TABLETS BY MOUTH AT DINNER FOR FOR BLOOD PRESSURE  Other orders - atenolol (TENORMIN) 25 MG tablet; Take 1 tablet (25 mg total) by mouth  daily.  FOLLOW-UP: 3 months   The above assessment and management plan was discussed with the patient. The patient verbalized understanding of and has agreed to the management plan. Patient is aware to call the clinic if symptoms persist or worsen. Patient is aware when to return to the clinic for a follow-up visit. Patient educated on when it is appropriate to go to  the emergency department.    I provided 16 minutes of non-face-to-face time during this encounter.  [name], MD Primary Care at [facility] [place],  [address] Ph.  [phone] Fax [phone]

## 2018-06-26 NOTE — Progress Notes (Signed)
Pt c/o to discuss BP and BP meds. She said she took her BP after her lunch meds and it as 137/76.

## 2018-07-30 ENCOUNTER — Telehealth: Payer: Self-pay | Admitting: *Deleted

## 2018-07-30 NOTE — Telephone Encounter (Signed)
Schedule AWV.  

## 2018-08-07 ENCOUNTER — Other Ambulatory Visit: Payer: Self-pay | Admitting: Emergency Medicine

## 2018-08-07 DIAGNOSIS — I10 Essential (primary) hypertension: Secondary | ICD-10-CM

## 2018-08-14 NOTE — Telephone Encounter (Signed)
Pt states she got a call for an appt, this is only call I see, and pt is wanting to do her AWV, but prefers to do virtual. Pt would like a call back.  Has several issues she would like to discuss

## 2018-08-26 ENCOUNTER — Other Ambulatory Visit: Payer: Self-pay

## 2018-08-26 ENCOUNTER — Ambulatory Visit (INDEPENDENT_AMBULATORY_CARE_PROVIDER_SITE_OTHER): Payer: Medicare Other | Admitting: Family Medicine

## 2018-08-26 VITALS — BP 136/65 | Ht 61.0 in | Wt 179.0 lb

## 2018-08-26 DIAGNOSIS — Z0000 Encounter for general adult medical examination without abnormal findings: Secondary | ICD-10-CM | POA: Diagnosis not present

## 2018-08-26 NOTE — Patient Instructions (Addendum)
Thank you for taking time to come for your Medicare Wellness Visit. I appreciate your ongoing commitment to your health goals. Please review the following plan we discussed and let me know if I can assist you in the future.  Leroy Kennedy LPN   Preventive Care 74 Years and Older, Female Preventive care refers to lifestyle choices and visits with your health care provider that can promote health and wellness. What does preventive care include?  A yearly physical exam. This is also called an annual well check.  Dental exams once or twice a year.  Routine eye exams. Ask your health care provider how often you should have your eyes checked.  Personal lifestyle choices, including: ? Daily care of your teeth and gums. ? Regular physical activity. ? Eating a healthy diet. ? Avoiding tobacco and drug use. ? Limiting alcohol use. ? Practicing safe sex. ? Taking low-dose aspirin every day. ? Taking vitamin and mineral supplements as recommended by your health care provider. What happens during an annual well check? The services and screenings done by your health care provider during your annual well check will depend on your age, overall health, lifestyle risk factors, and family history of disease. Counseling Your health care provider may ask you questions about your:  Alcohol use.  Tobacco use.  Drug use.  Emotional well-being.  Home and relationship well-being.  Sexual activity.  Eating habits.  History of falls.  Memory and ability to understand (cognition).  Work and work Statistician.  Reproductive health.  Screening You may have the following tests or measurements:  Height, weight, and BMI.  Blood pressure.  Lipid and cholesterol levels. These may be checked every 5 years, or more frequently if you are over 5 years old.  Skin check.  Lung cancer screening. You may have this screening every year starting at age 64 if you have a 30-pack-year history of smoking  and currently smoke or have quit within the past 15 years.  Colorectal cancer screening. All adults should have this screening starting at age 29 and continuing until age 38. You will have tests every 1-10 years, depending on your results and the type of screening test. People at increased risk should start screening at an earlier age. Screening tests may include: ? Guaiac-based fecal occult blood testing. ? Fecal immunochemical test (FIT). ? Stool DNA test. ? Virtual colonoscopy. ? Sigmoidoscopy. During this test, a flexible tube with a tiny camera (sigmoidoscope) is used to examine your rectum and lower colon. The sigmoidoscope is inserted through your anus into your rectum and lower colon. ? Colonoscopy. During this test, a long, thin, flexible tube with a tiny camera (colonoscope) is used to examine your entire colon and rectum.  Hepatitis C blood test.  Hepatitis B blood test.  Sexually transmitted disease (STD) testing.  Diabetes screening. This is done by checking your blood sugar (glucose) after you have not eaten for a while (fasting). You may have this done every 1-3 years.  Bone density scan. This is done to screen for osteoporosis. You may have this done starting at age 65.  Mammogram. This may be done every 1-2 years. Talk to your health care provider about how often you should have regular mammograms. Talk with your health care provider about your test results, treatment options, and if necessary, the need for more tests. Vaccines Your health care provider may recommend certain vaccines, such as:  Influenza vaccine. This is recommended every year.  Tetanus, diphtheria, and acellular pertussis (Tdap,  Td) vaccine. You may need a Td booster every 10 years.  Varicella vaccine. You may need this if you have not been vaccinated.  Zoster vaccine. You may need this after age 71.  Measles, mumps, and rubella (MMR) vaccine. You may need at least one dose of MMR if you were born  in 1957 or later. You may also need a second dose.  Pneumococcal 13-valent conjugate (PCV13) vaccine. One dose is recommended after age 15.  Pneumococcal polysaccharide (PPSV23) vaccine. One dose is recommended after age 40.  Meningococcal vaccine. You may need this if you have certain conditions.  Hepatitis A vaccine. You may need this if you have certain conditions or if you travel or work in places where you may be exposed to hepatitis A.  Hepatitis B vaccine. You may need this if you have certain conditions or if you travel or work in places where you may be exposed to hepatitis B.  Haemophilus influenzae type b (Hib) vaccine. You may need this if you have certain conditions. Talk to your health care provider about which screenings and vaccines you need and how often you need them. This information is not intended to replace advice given to you by your health care provider. Make sure you discuss any questions you have with your health care provider. Document Released: 03/19/2015 Document Revised: 04/12/2017 Document Reviewed: 12/22/2014 Elsevier Interactive Patient Education  2019 [employer].

## 2018-08-26 NOTE — Progress Notes (Signed)
Presents today for [employer] Visit   Date of last exam: 03/28/2018  Interpreter used for this visit? No  I connected with  [name] on 08/26/18 by telephone application and verified that I am speaking with the correct person using two identifiers.   I discussed the limitations of evaluation and management by telemedicine. The patient expressed understanding and agreed to proceed.   Patient Care Team: [name], MD as PCP - General (Family Medicine) [name] [name], MD as Consulting Physician (Obstetrics and Gynecology) [name], MD as Attending Physician (Neurology) [name], MD as Consulting Physician (Internal Medicine)   Other items to address today:  Discussed eye/dental exams Discussed Immunizations Discussed safety measures with COVID  Advised she would be safe to walk neighborhood to get some exercise Discussed BP readings       Other Screening: Last screening for diabetes: 08/14/2017 Last lipid screening:   ADVANCE DIRECTIVES: Discussed: Yes On File: no Materials Provided: yes    Immunization status:  Immunization History  Administered Date(s) Administered  . DTaP 03/05/2008  . Pneumococcal Conjugate-13 11/13/2015  . Pneumococcal-Unspecified 06/27/2010  . Tdap 02/12/2015     There are no preventive care reminders to display for this patient.   Functional Status Survey: Is the patient deaf or have difficulty hearing?: No Does the patient have difficulty seeing, even when wearing glasses/contacts?: No Does the patient have difficulty concentrating, remembering, or making decisions?: No Does the patient have difficulty walking or climbing stairs?: Yes(knee pain arthritis) Does the patient have difficulty dressing or bathing?: No Does the patient have difficulty doing errands alone such as visiting a doctor's office or shopping?: No   6CIT Screen 08/26/2018  What Year? 0 points  What month? 0  points  What time? 0 points  Count back from 20 0 points  Months in reverse 0 points  Repeat phrase 0 points  Total Score 0        Clinical Support from 08/26/2018 in [facility]  AUDIT-C Score  2       Home Environment:   Lives in one story home with sister Knee pain arthritis  Has a little trouble climbing stairs Yes Grab bars Yes scattered rugs.  With grips Adequate lighting   Patient Active Problem List   Diagnosis Date Noted  . Obstructive sleep apnea treated with continuous positive airway pressure (CPAP) 07/02/2017  . Osteopenia 09/01/2016  . Urinary incontinence in female 09/01/2016  . Vitamin D deficiency 09/01/2016  . BMI 35.0-35.9,adult 11/16/2015  . Essential hypertension, benign 04/28/2012  . Osteoarthritis of left knee 04/28/2012  . Hypothyroidism 04/28/2012  . Hx of colonic polyps 12/15/2010  . Anxiety 11/17/2010     Past Medical History:  Diagnosis Date  . Abnormal perimenopausal bleeding 2005  . Allergy   . Anemia   . Anxiety   . Dysuria 2004  . Endometrial polyp 2006  . Environmental allergies   . Fibroid 02/19/03  . H/O varicella   . History of measles, mumps, or rubella   . Hx of colonic polyps 12/15/2010  . Hypertension 07-19-2010   echo for pre-op EF 55%  normal left wall thickness LA mildly dialated with mitral and tricuspid regurgatation  . Hypothyroidism   . Interstitial cystitis   . Menopausal symptoms 2004  . Obesity   . Osteopenia 02/2012   -1.8 T score right femur neck  . PMB (postmenopausal bleeding) 06/21/10  . Sleep apnea   .  Urinary incontinence 2011  . Vitamin D deficiency    history of     Past Surgical History:  Procedure Laterality Date  . COLONOSCOPY  12/15/10  . HYSTEROSCOPY  2006 and May 2012   for fibroids, endometrial polyps  . MOUTH SURGERY    . MYOMECTOMY  1974     Family History  Problem Relation Age of Onset  . Kidney disease Father        from uremia  . Benign prostatic hyperplasia  Father   . Hypertension Mother   . Colon cancer Paternal Uncle   . Clotting disorder Maternal Grandmother        ????     Social History   Socioeconomic History  . Marital status: Divorced    Spouse name: n/a  . Number of children: 0  . Years of education: college  . Highest education level: Not on file  Occupational History  . Not on file  Social Needs  . Financial resource strain: Not on file  . Food insecurity    Worry: Not on file    Inability: Not on file  . Transportation needs    Medical: Not on file    Non-medical: Not on file  Tobacco Use  . Smoking status: Never Smoker  . Smokeless tobacco: Never Used  Substance and Sexual Activity  . Alcohol use: Yes    Alcohol/week: 1.0 standard drinks    Types: 1 Glasses of wine per week    Comment: occasional (1-2 times per week)  . Drug use: No  . Sexual activity: Not Currently    Birth control/protection: Abstinence  Lifestyle  . Physical activity    Days per week: Not on file    Minutes per session: Not on file  . Stress: Not on file  Relationships  . Social Herbalist on phone: Not on file    Gets together: Not on file    Attends religious service: Not on file    Active member of club or organization: Not on file    Attends meetings of clubs or organizations: Not on file    Relationship status: Not on file  . Intimate partner violence    Fear of current or ex partner: Not on file    Emotionally abused: Not on file    Physically abused: Not on file    Forced sexual activity: Not on file  Other Topics Concern  . Not on file  Social History Narrative   Lives alone.   Sister stays with her frequently.   Participates in [employer] exercise program.     Allergies  Allergen Reactions  . Bactrim [Sulfamethoxazole-Trimethoprim]   . Epinephrine     shakey  . Erythromycin   . Sulfa Antibiotics Rash     Prior to Admission medications   Medication Sig Start Date End Date Taking?  Authorizing Provider  Ascorbic Acid (VITAMIN C) 1000 MG tablet Take 1,000 mg by mouth daily. One tablet a day   Yes [provider]  atenolol (TENORMIN) 25 MG tablet Take 1 tablet (25 mg total) by mouth daily. 06/26/18  Yes [name], MD  benazepril-hydrochlorthiazide (LOTENSIN HCT) 20-12.5 MG tablet Take 2 tablets by mouth daily. 06/26/18  Yes [name], MD  Cholecalciferol (VITAMIN D3) 50 MCG (2000 UT) TABS Vitamin D3 50 mcg (2,000 unit) capsule  Take by oral route.   Yes [provider]  hydrALAZINE (APRESOLINE) 25 MG tablet TAKE TWO TABLETS BY MOUTH  IN THE MORNING, THEN ONE TABLET BY MOUTH AT LUNCH, THEN TWO TABLETS BY MOUTH AT DINNER FOR FOR BLOOD PRESSURE 06/26/18  Yes [name], MD  levothyroxine (SYNTHROID, LEVOTHROID) 112 MCG tablet Take 1 tablet (112 mcg total) by mouth daily before breakfast. 01/04/18  Yes [name], MD  liothyronine (CYTOMEL) 5 MCG tablet TAKE 1 TABLET BY MOUTH DAILY FOR THYROID 01/04/18  Yes [name], MD     Depression screen [facility] 2/9 08/26/2018 06/26/2018 08/14/2017 02/07/2017 01/18/2017  Decreased Interest 0 0 3 0 0  Down, Depressed, Hopeless 0 0 3 0 0  PHQ - 2 Score 0 0 6 0 0  Altered sleeping - - 3 - -  Tired, decreased energy - - 3 - -  Change in appetite - - 1 - -  Feeling bad or failure about yourself  - - 0 - -  Trouble concentrating - - 1 - -  Moving slowly or fidgety/restless - - 0 - -  Suicidal thoughts - - 0 - -  PHQ-9 Score - - 14 - -     Fall Risk  08/26/2018 06/26/2018 03/28/2018 08/14/2017 02/07/2017  Falls in the past year? 0 0 0 Yes No  Number falls in past yr: 0 - - 1 -  Injury with Fall? 0 - - No -  Follow up Falls evaluation completed;Education provided;Falls prevention discussed - - - -      PHYSICAL EXAM: BP 136/65 Comment: patient took at home  Ht 5\' 1"  (1.549 m)   Wt 179 lb (81.2 kg)   BMI 33.82 kg/m    Wt Readings from Last 3 Encounters:  08/26/18 179 lb (81.2 kg)  03/28/18  179 lb (81.2 kg)  01/04/18 175 lb (79.4 kg)     No exam data present    Physical Exam   Education/Counseling provided regarding diet and exercise, prevention of chronic diseases, smoking/tobacco cessation, if applicable, and reviewed "Covered Medicare Preventive Services."

## 2018-09-21 ENCOUNTER — Other Ambulatory Visit: Payer: Self-pay | Admitting: Family Medicine

## 2018-09-22 ENCOUNTER — Encounter: Payer: Self-pay | Admitting: Internal Medicine

## 2018-09-22 ENCOUNTER — Encounter: Payer: Self-pay | Admitting: Family Medicine

## 2018-09-23 ENCOUNTER — Encounter: Payer: Self-pay | Admitting: Family Medicine

## 2018-09-23 ENCOUNTER — Telehealth (INDEPENDENT_AMBULATORY_CARE_PROVIDER_SITE_OTHER): Payer: Medicare Other | Admitting: Family Medicine

## 2018-09-23 DIAGNOSIS — I10 Essential (primary) hypertension: Secondary | ICD-10-CM

## 2018-09-23 DIAGNOSIS — L729 Follicular cyst of the skin and subcutaneous tissue, unspecified: Secondary | ICD-10-CM | POA: Diagnosis not present

## 2018-09-23 DIAGNOSIS — F419 Anxiety disorder, unspecified: Secondary | ICD-10-CM | POA: Diagnosis not present

## 2018-09-23 MED ORDER — METOPROLOL SUCCINATE ER 25 MG PO TB24: 25.0000 mg | ORAL_TABLET | Freq: Every day | ORAL | 3 refills | Status: DC

## 2018-09-23 NOTE — Progress Notes (Signed)
Virtual Visit Note  I connected with patient on 09/23/18 at 237pm by phone and verified that I am speaking with the correct person using two identifiers. [name] is currently located at home and patient is currently with them during visit. The provider, [name], MD is located in their office at time of visit.  I discussed the limitations, risks, security and privacy concerns of performing an evaluation and management service by telephone and the availability of in person appointments. I also discussed with the patient that there may be a patient responsible charge related to this service. The patient expressed understanding and agreed to proceed.   CC: BP followup  HPI ? Patient is a62 y.o.femalewith past medical history significant for HTN, anxiety, interstitial cystitis, vitamin D deficiency, OSA on CPAP and hypothyroidismwho presents for followup on her BP  Last OV April 2020 - telemedicine Started on atenolol 25mg  once a day On benzapril, hctz and hydralazine  My blood pressure readings  7/9. 145/81  7/10  179/90 - evening ~ 830pm, takes hydralazine 7pm  7/12. 128/80  7/15. 141/75  7/16. 170/85 - evening ~830pm, takes hydralazine 7pm  7/19. 154/69  Heart rate in the 50s Has been having ankle swelling Denies chest pain, palpitations, SOB, cough, fever or chills Reports Chronic insomnia despite use of cpap - used to work odd hours for years, never adjusted sleep schedule  Having issues with anxiety and depression  Significantly affected due covid and current political climate  She also reports a bump that grew on her back for past several days Painful only if she lies on it No drainage She has been applying neosporin       Allergies  Allergen Reactions  . Bactrim [Sulfamethoxazole-Trimethoprim]   . Epinephrine     shakey  . Erythromycin   . Sulfa Antibiotics Rash    Prior to Admission medications   Medication Sig Start Date  End Date Taking? Authorizing Provider  Ascorbic Acid (VITAMIN C) 1000 MG tablet Take 1,000 mg by mouth daily. One tablet a day    [provider]  atenolol (TENORMIN) 25 MG tablet Take 1 tablet (25 mg total) by mouth daily. 06/26/18   [name], MD  benazepril-hydrochlorthiazide (LOTENSIN HCT) 20-12.5 MG tablet Take 2 tablets by mouth daily. 06/26/18   [name], MD  Cholecalciferol (VITAMIN D3) 50 MCG (2000 UT) TABS Vitamin D3 50 mcg (2,000 unit) capsule  Take by oral route.    [provider]  hydrALAZINE (APRESOLINE) 25 MG tablet TAKE TWO TABLETS BY MOUTH IN THE MORNING, THEN ONE TABLET BY MOUTH AT LUNCH, THEN TWO TABLETS BY MOUTH AT DINNER FOR FOR BLOOD PRESSURE 06/26/18   [name], MD  levothyroxine (SYNTHROID, LEVOTHROID) 112 MCG tablet Take 1 tablet (112 mcg total) by mouth daily before breakfast. 01/04/18   [name], MD  liothyronine (CYTOMEL) 5 MCG tablet TAKE 1 TABLET BY MOUTH DAILY FOR THYROID 01/04/18   [name], MD    Past Medical History:  Diagnosis Date  . Abnormal perimenopausal bleeding 2005  . Allergy   . Anemia   . Anxiety   . Dysuria 2004  . Endometrial polyp 2006  . Environmental allergies   . Fibroid 02/19/03  . H/O varicella   . History of measles, mumps, or rubella   . Hx of colonic polyps 12/15/2010  . Hypertension 07-19-2010   echo for pre-op EF 55%  normal left wall thickness LA mildly dialated with mitral  and tricuspid regurgatation  . Hypothyroidism   . Interstitial cystitis   . Menopausal symptoms 2004  . Obesity   . Osteopenia 02/2012   -1.8 T score right femur neck  . PMB (postmenopausal bleeding) 06/21/10  . Sleep apnea   . Urinary incontinence 2011  . Vitamin D deficiency    history of    Past Surgical History:  Procedure Laterality Date  . COLONOSCOPY  12/15/10  . HYSTEROSCOPY  2006 and May 2012   for fibroids, endometrial polyps  . MOUTH SURGERY    . MYOMECTOMY  1974    Social  History   Tobacco Use  . Smoking status: Never Smoker  . Smokeless tobacco: Never Used  Substance Use Topics  . Alcohol use: Yes    Alcohol/week: 1.0 standard drinks    Types: 1 Glasses of wine per week    Comment: occasional (1-2 times per week)    Family History  Problem Relation Age of Onset  . Kidney disease Father        from uremia  . Benign prostatic hyperplasia Father   . Hypertension Mother   . Colon cancer Paternal Uncle   . Clotting disorder Maternal Grandmother        ????    ROS Per hpi  Objective  Vitals as reported by the patient: per above   ASSESSMENT and PLAN  1. Essential hypertension, benign Not controlled. Changing to metoprolol. Previous edema with amlodipine. Has sign UI so changing to chlorthalidone or adding spironolactone might not be tolerated.   2. Anxiety Not controlled. Not interested in medication. Discussed LFM. Referring for counseling.  - Ambulatory referral to Psychology  3. Skin cyst Discussed unable to diagnosis properly via phone. Seems to be a cyst, discussed use of moist heat. RTC precautions reviewed.   Other orders - metoprolol succinate (TOPROL-XL) 25 MG 24 hr tablet; Take 1 tablet (25 mg total) by mouth daily.  FOLLOW-UP: 3 months   The above assessment and management plan was discussed with the patient. The patient verbalized understanding of and has agreed to the management plan. Patient is aware to call the clinic if symptoms persist or worsen. Patient is aware when to return to the clinic for a follow-up visit. Patient educated on when it is appropriate to go to the emergency department.    I provided 23 minutes of non-face-to-face time during this encounter.  [name], MD [facility] at [facility] [place], [place] [address] Ph.  [phone] Fax [phone]

## 2018-09-23 NOTE — Progress Notes (Signed)
Pt is following on the atenolol that she was given, says she does not feel the med is working due to the high readings that she has been getting. She says the numbers are normal only sometime. She uploaded a log for doctor to view along with a picture of a bump that is on her back that is giving her some pain when she lays on it. She has been using neosporin on it and using the hot water from the shower. She says she is having anxiety and depression due to having to be in the house all the time. She wants to know is it ok for her to go and get her hair cut and go to the grocery store? She is afraid with having sleep apnea, if she gets this virus, it will be her demise

## 2018-09-27 ENCOUNTER — Encounter: Payer: Self-pay | Admitting: Family Medicine

## 2018-10-02 ENCOUNTER — Ambulatory Visit (INDEPENDENT_AMBULATORY_CARE_PROVIDER_SITE_OTHER): Payer: Medicare Other | Admitting: Family Medicine

## 2018-10-02 ENCOUNTER — Encounter: Payer: Self-pay | Admitting: Family Medicine

## 2018-10-02 ENCOUNTER — Other Ambulatory Visit: Payer: Self-pay

## 2018-10-02 VITALS — BP 132/82 | HR 96 | Temp 98.2°F | Ht 61.5 in | Wt 176.8 lb

## 2018-10-02 DIAGNOSIS — L729 Follicular cyst of the skin and subcutaneous tissue, unspecified: Secondary | ICD-10-CM | POA: Diagnosis not present

## 2018-10-02 MED ORDER — CEPHALEXIN 250 MG PO CAPS: 250.0000 mg | ORAL_CAPSULE | Freq: Two times a day (BID) | ORAL | 0 refills | Status: DC

## 2018-10-02 NOTE — Progress Notes (Signed)
Acute Office Visit  Subjective:    Patient ID: [name], female    DOB: 01/24/1946, 73 y.o.   MRN: [id number]  Chief Complaint  Patient presents with  . Cyst    on back    HPI Patient here today for concern cyst noted  on midback-pt states she has noted  several days ago.pt states cysts in the past.  None at current location. NO drainage.  Tenderness associated. No bites.    Past Medical History:  Diagnosis Date  . Abnormal perimenopausal bleeding 2005  . Allergy   . Anemia   . Anxiety   . Dysuria 2004  . Endometrial polyp 2006  . Environmental allergies   . Fibroid 02/19/03  . H/O varicella   . History of measles, mumps, or rubella   . Hx of colonic polyps 12/15/2010  . Hypertension 07-19-2010   echo for pre-op EF 55%  normal left wall thickness LA mildly dialated with mitral and tricuspid regurgatation  . Hypothyroidism   . Interstitial cystitis   . Menopausal symptoms 2004  . Obesity   . Osteopenia 02/2012   -1.8 T score right femur neck  . PMB (postmenopausal bleeding) 06/21/10  . Sleep apnea   . Urinary incontinence 2011  . Vitamin D deficiency    history of    Past Surgical History:  Procedure Laterality Date  . COLONOSCOPY  12/15/10  . HYSTEROSCOPY  2006 and May 2012   for fibroids, endometrial polyps  . MOUTH SURGERY    . MYOMECTOMY  1974    Family History  Problem Relation Age of Onset  . Kidney disease Father        from uremia  . Benign prostatic hyperplasia Father   . Hypertension Mother   . Colon cancer Paternal Uncle   . Clotting disorder Maternal Grandmother        ????    Social History   Socioeconomic History  . Marital status: Divorced    Spouse name: n/a  . Number of children: 0  . Years of education: college  . Highest education level: Not on file  Occupational History  . Not on file  Social Needs  . Financial resource strain: Not on file  . Food insecurity    Worry: Not on file    Inability: Not on file  .  Transportation needs    Medical: Not on file    Non-medical: Not on file  Tobacco Use  . Smoking status: Never Smoker  . Smokeless tobacco: Never Used  Substance and Sexual Activity  . Alcohol use: Yes    Alcohol/week: 1.0 standard drinks    Types: 1 Glasses of wine per week    Comment: occasional (1-2 times per week)  . Drug use: No  . Sexual activity: Not Currently    Birth control/protection: Abstinence  Lifestyle  . Physical activity    Days per week: Not on file    Minutes per session: Not on file  . Stress: Not on file  Relationships  . Social Herbalist on phone: Not on file    Gets together: Not on file    Attends religious service: Not on file    Active member of club or organization: Not on file    Attends meetings of clubs or organizations: Not on file    Relationship status: Not on file  . Intimate partner violence    Fear of current or ex partner: Not on file  Emotionally abused: Not on file    Physically abused: Not on file    Forced sexual activity: Not on file  Other Topics Concern  . Not on file  Social History Narrative   Lives alone.   Sister stays with her frequently.   Participates in [employer] exercise program.    Outpatient Medications Prior to Visit  Medication Sig Dispense Refill  . Ascorbic Acid (VITAMIN C) 1000 MG tablet Take 1,000 mg by mouth daily. One tablet a day    . benazepril-hydrochlorthiazide (LOTENSIN HCT) 20-12.5 MG tablet Take 2 tablets by mouth daily. 180 tablet 1  . Cholecalciferol (VITAMIN D3) 50 MCG (2000 UT) TABS Vitamin D3 50 mcg (2,000 unit) capsule  Take by oral route.    . hydrALAZINE (APRESOLINE) 25 MG tablet TAKE TWO TABLETS BY MOUTH IN THE MORNING, THEN ONE TABLET BY MOUTH AT LUNCH, THEN TWO TABLETS BY MOUTH AT DINNER FOR FOR BLOOD PRESSURE 450 tablet 2  . levothyroxine (SYNTHROID, LEVOTHROID) 112 MCG tablet Take 1 tablet (112 mcg total) by mouth daily before breakfast. 90 tablet 3  . liothyronine  (CYTOMEL) 5 MCG tablet TAKE 1 TABLET BY MOUTH DAILY FOR THYROID 90 tablet 3  . metoprolol succinate (TOPROL-XL) 25 MG 24 hr tablet Take 1 tablet (25 mg total) by mouth daily. 90 tablet 3   No facility-administered medications prior to visit.     Allergies  Allergen Reactions  . Bactrim [Sulfamethoxazole-Trimethoprim]   . Epinephrine     shakey  . Erythromycin   . Sulfa Antibiotics Rash    Review of Systems  Constitutional: Negative for fever.  cyst-mid back    Objective:    Physical Exam  Constitutional: She appears well-developed and well-nourished.  Skin: There is erythema.  4cm right midback-cyst-associated ecchymosis  BP 132/82 (BP Location: Left Arm, Patient Position: Sitting, Cuff Size: Normal)   Pulse 96   Temp 98.2 F (36.8 C) (Oral)   Ht 5' 1.5" (1.562 m)   Wt 176 lb 12.8 oz (80.2 kg)   SpO2 99%   BMI 32.87 kg/m  Wt Readings from Last 3 Encounters:  10/02/18 176 lb 12.8 oz (80.2 kg)  08/26/18 179 lb (81.2 kg)  03/28/18 179 lb (81.2 kg)     Lab Results  Component Value Date   TSH 0.963 03/28/2018   Lab Results  Component Value Date   WBC 8.4 07/31/2017   HGB 14.6 07/31/2017   HCT 45.0 07/31/2017   MCV 82.7 07/31/2017   PLT 268 07/31/2017   Lab Results  Component Value Date   NA 142 03/28/2018   K 3.8 03/28/2018   CO2 23 03/28/2018   GLUCOSE 94 03/28/2018   BUN 15 03/28/2018   CREATININE 0.67 03/28/2018   BILITOT 0.3 03/28/2018   ALKPHOS 57 03/28/2018   AST 15 03/28/2018   ALT 16 03/28/2018   PROT 6.5 03/28/2018   ALBUMIN 4.2 03/28/2018   CALCIUM 9.6 03/28/2018   ANIONGAP 11 07/31/2017   Lab Results  Component Value Date   CHOL 185 12/02/2014   Lab Results  Component Value Date   HDL 46 12/02/2014   Lab Results  Component Value Date   LDLCALC 113 12/02/2014   Lab Results  Component Value Date   TRIG 132 12/02/2014   Lab Results  Component Value Date   CHOLHDL 4.0 12/02/2014   Lab Results  Component Value Date   HGBA1C  5.5 08/14/2017       Assessment & Plan:  1. Cyst  of skin D/w pt-keflex-rx-risk/benefit/side effect d/w pt-likely manipulation caused associated ecchymosis. Advised to resist picking, squeezing or rubbing area. Heat to area . Derm referral - Ambulatory referral to Dermatology LISA Hannah Beat, MD

## 2018-10-06 ENCOUNTER — Encounter: Payer: Self-pay | Admitting: Family Medicine

## 2018-10-10 ENCOUNTER — Encounter: Payer: Self-pay | Admitting: Family Medicine

## 2018-10-11 ENCOUNTER — Ambulatory Visit (INDEPENDENT_AMBULATORY_CARE_PROVIDER_SITE_OTHER): Payer: Medicare Other | Admitting: Psychology

## 2018-10-11 DIAGNOSIS — F411 Generalized anxiety disorder: Secondary | ICD-10-CM

## 2018-10-21 ENCOUNTER — Ambulatory Visit (INDEPENDENT_AMBULATORY_CARE_PROVIDER_SITE_OTHER): Payer: Medicare Other | Admitting: Psychology

## 2018-10-21 DIAGNOSIS — F411 Generalized anxiety disorder: Secondary | ICD-10-CM | POA: Diagnosis not present

## 2018-10-29 ENCOUNTER — Ambulatory Visit (INDEPENDENT_AMBULATORY_CARE_PROVIDER_SITE_OTHER): Payer: Medicare Other | Admitting: Psychology

## 2018-10-29 DIAGNOSIS — F411 Generalized anxiety disorder: Secondary | ICD-10-CM

## 2018-11-06 ENCOUNTER — Ambulatory Visit (INDEPENDENT_AMBULATORY_CARE_PROVIDER_SITE_OTHER): Payer: Medicare Other | Admitting: Psychology

## 2018-11-06 DIAGNOSIS — F411 Generalized anxiety disorder: Secondary | ICD-10-CM

## 2018-11-13 ENCOUNTER — Ambulatory Visit (INDEPENDENT_AMBULATORY_CARE_PROVIDER_SITE_OTHER): Payer: Medicare Other | Admitting: Psychology

## 2018-11-13 DIAGNOSIS — F411 Generalized anxiety disorder: Secondary | ICD-10-CM

## 2018-11-19 ENCOUNTER — Ambulatory Visit (INDEPENDENT_AMBULATORY_CARE_PROVIDER_SITE_OTHER): Payer: Medicare Other | Admitting: Psychology

## 2018-11-19 DIAGNOSIS — F411 Generalized anxiety disorder: Secondary | ICD-10-CM | POA: Diagnosis not present

## 2018-11-27 ENCOUNTER — Ambulatory Visit (INDEPENDENT_AMBULATORY_CARE_PROVIDER_SITE_OTHER): Payer: Medicare Other | Admitting: Psychology

## 2018-11-27 DIAGNOSIS — F411 Generalized anxiety disorder: Secondary | ICD-10-CM | POA: Diagnosis not present

## 2018-12-03 ENCOUNTER — Ambulatory Visit: Payer: Medicare Other | Admitting: Psychology

## 2018-12-04 ENCOUNTER — Ambulatory Visit: Payer: Medicare Other

## 2018-12-18 ENCOUNTER — Ambulatory Visit (INDEPENDENT_AMBULATORY_CARE_PROVIDER_SITE_OTHER): Payer: Medicare Other | Admitting: Psychology

## 2018-12-18 DIAGNOSIS — F411 Generalized anxiety disorder: Secondary | ICD-10-CM | POA: Diagnosis not present

## 2018-12-20 ENCOUNTER — Other Ambulatory Visit: Payer: Self-pay | Admitting: Family Medicine

## 2018-12-20 ENCOUNTER — Other Ambulatory Visit: Payer: Self-pay | Admitting: Internal Medicine

## 2018-12-20 DIAGNOSIS — I10 Essential (primary) hypertension: Secondary | ICD-10-CM

## 2018-12-23 ENCOUNTER — Ambulatory Visit (INDEPENDENT_AMBULATORY_CARE_PROVIDER_SITE_OTHER): Payer: Medicare Other | Admitting: Family Medicine

## 2018-12-23 ENCOUNTER — Other Ambulatory Visit: Payer: Self-pay

## 2018-12-23 DIAGNOSIS — Z23 Encounter for immunization: Secondary | ICD-10-CM

## 2018-12-24 ENCOUNTER — Ambulatory Visit (INDEPENDENT_AMBULATORY_CARE_PROVIDER_SITE_OTHER): Payer: Medicare Other | Admitting: Psychology

## 2018-12-24 DIAGNOSIS — F411 Generalized anxiety disorder: Secondary | ICD-10-CM

## 2018-12-31 ENCOUNTER — Ambulatory Visit (INDEPENDENT_AMBULATORY_CARE_PROVIDER_SITE_OTHER): Payer: Medicare Other | Admitting: Psychology

## 2018-12-31 DIAGNOSIS — F411 Generalized anxiety disorder: Secondary | ICD-10-CM | POA: Diagnosis not present

## 2019-01-06 ENCOUNTER — Ambulatory Visit (INDEPENDENT_AMBULATORY_CARE_PROVIDER_SITE_OTHER): Payer: Medicare Other | Admitting: Psychology

## 2019-01-06 DIAGNOSIS — F411 Generalized anxiety disorder: Secondary | ICD-10-CM

## 2019-01-08 ENCOUNTER — Ambulatory Visit: Payer: Medicare Other | Admitting: Internal Medicine

## 2019-01-09 ENCOUNTER — Ambulatory Visit (INDEPENDENT_AMBULATORY_CARE_PROVIDER_SITE_OTHER): Payer: Medicare Other | Admitting: Internal Medicine

## 2019-01-09 ENCOUNTER — Encounter: Payer: Self-pay | Admitting: Internal Medicine

## 2019-01-09 ENCOUNTER — Other Ambulatory Visit: Payer: Self-pay

## 2019-01-09 DIAGNOSIS — E039 Hypothyroidism, unspecified: Secondary | ICD-10-CM | POA: Diagnosis not present

## 2019-01-09 NOTE — Patient Instructions (Signed)
Please continue  Levothyroxine 112 mcg and Liothyronine 5 mcg daily.  Take the thyroid hormone every day, with water, at least 30 minutes before breakfast, separated by at least 4 hours from: - acid reflux medications - calcium - iron - multivitamins  Please come back for labs as soon as safe.  Please return in 1 year.

## 2019-01-09 NOTE — Progress Notes (Signed)
Patient ID: [name], female   DOB: [birth year], 73 y.o.   MRN: [id number]  Patient location: Home My location: Office  Referring Provider: [name], MD  I connected with the patient on 01/09/19 at 10:00 AM EST by a video enabled telemedicine application and verified that I am speaking with the correct person.   I discussed the limitations of evaluation and management by telemedicine and the availability of in person appointments. The patient expressed understanding and agreed to proceed.   Details of the encounter are shown below.  HPI  [name] is a 73 y.o.-year-old female, presenting for f/u for acquired hypothyroidism. Last visit 1 year ago.  She has been diagnosed with hypothyroidism "many years" ago.  She continues on both liothyronine and levothyroxine.  She takes LT4 112 mcg and LT3 5 mcg daily (equivalent of 132 mcg of LT4): - in am - fasting - at least 1 hour from b'fast - no Fe, MVI, PPIs - stopped Calcium - was taking at lunchtime - not on Biotin On vit C and D at night.  Reviewed latest TFTs: Lab Results  Component Value Date   TSH 0.963 03/28/2018   TSH 1.240 08/14/2017   TSH 1.230 09/01/2016   TSH 1.75 11/13/2015   TSH 1.34 04/14/2015   TSH 3.549 02/12/2015   TSH 5.529 (H) 12/02/2014   TSH 2.161 05/30/2014   TSH 1.906 08/18/2013   TSH 2.830 04/30/2013   FREET4 1.67 03/28/2018   FREET4 1.82 (H) 08/14/2017   FREET4 1.75 09/01/2016   FREET4 1.6 11/13/2015   FREET4 1.37 04/14/2015   FREET4 1.71 04/30/2013   FREET4 1.45 10/05/2012   FREET4 1.39 08/16/2012   She complains of poor memory when the TSH is close to the upper limit of normal.  She started to feel much better after the last increase in dose in 02/2015) remains normal after.    She continues to complain of insomnia, which is chronic for her.  Pt denies: - feeling nodules in neck - hoarseness - dysphagia - choking - SOB with lying down  She has no FH of thyroid  disorders. No FH of thyroid cancer. No h/o radiation tx to head or neck.  No herbal supplements. No Biotin use. No recent steroids use.   She has anxiety >> seeing a therapy.  ROS: Constitutional: no weight gain/no weight loss, + fatigue 2/2 insomnia, no subjective hyperthermia, no subjective hypothermia Eyes: no blurry vision, no xerophthalmia ENT: no sore throat, + see HPI Cardiovascular: no CP/no SOB/no palpitations/no leg swelling Respiratory: no cough/no SOB/no wheezing Gastrointestinal: no N/no V/no D/no C/no acid reflux Musculoskeletal: no muscle aches/no joint aches Skin: no rashes, no hair loss Neurological: no tremors/no numbness/no tingling/no dizziness  I reviewed pt's medications, allergies, PMH, social hx, family hx, and changes were documented in the history of present illness. Otherwise, unchanged from my initial visit note.  Past Medical History:  Diagnosis Date  . Abnormal perimenopausal bleeding 2005  . Allergy   . Anemia   . Anxiety   . Dysuria 2004  . Endometrial polyp 2006  . Environmental allergies   . Fibroid 02/19/03  . H/O varicella   . History of measles, mumps, or rubella   . Hx of colonic polyps 12/15/2010  . Hypertension 07-19-2010   echo for pre-op EF 55%  normal left wall thickness LA mildly dialated with mitral and tricuspid regurgatation  . Hypothyroidism   . Interstitial cystitis   . Menopausal symptoms 2004  .  Obesity   . Osteopenia 02/2012   -1.8 T score right femur neck  . PMB (postmenopausal bleeding) 06/21/10  . Sleep apnea   . Urinary incontinence 2011  . Vitamin D deficiency    history of   Past Surgical History:  Procedure Laterality Date  . COLONOSCOPY  12/15/10  . HYSTEROSCOPY  2006 and May 2012   for fibroids, endometrial polyps  . MOUTH SURGERY    . MYOMECTOMY  1974   Social History   Social History  . Marital Status: Divorced    Spouse Name: N/A  . Number of Children: 0   Occupational History  . 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 Smoking status: Never Smoker   . Smokeless tobacco: Never Used  . Alcohol Use: 0.6 oz/week    1 Glasses of wine per week     Comment: occasional (1-2 times per week)  . Drug Use: No   Social History Narrative   Divorced. Education: [employer]. Exercise: Yoga 2 times a week.   Current Outpatient Medications on File Prior to Visit  Medication Sig Dispense Refill  . Ascorbic Acid (VITAMIN C) 1000 MG tablet Take 1,000 mg by mouth daily. One tablet a day    . benazepril-hydrochlorthiazide (LOTENSIN HCT) 20-12.5 MG tablet TAKE 2 TABLETS BY MOUTH DAILY 180 tablet 1  . cephALEXin (KEFLEX) 250 MG capsule Take 1 capsule (250 mg total) by mouth 2 (two) times daily. 10 capsule 0  . Cholecalciferol (VITAMIN D3) 50 MCG (2000 UT) TABS Vitamin D3 50 mcg (2,000 unit) capsule  Take by oral route.    . hydrALAZINE (APRESOLINE) 25 MG tablet TAKE TWO TABLETS BY MOUTH IN THE MORNING, THEN ONE TABLET BY MOUTH AT LUNCH, THEN TWO TABLETS BY MOUTH AT DINNER FOR FOR BLOOD PRESSURE 450 tablet 2  . levothyroxine (SYNTHROID) 112 MCG tablet TAKE 1 TABLET BY MOUTH EVERY DAY BEFORE BREAKFAST 90 tablet 3  . liothyronine (CYTOMEL) 5 MCG tablet TAKE 1 TABLET BY MOUTH EVERY DAY FOR THYROID 90 tablet 3  . metoprolol succinate (TOPROL-XL) 25 MG 24 hr tablet Take 1 tablet (25 mg total) by mouth daily. 90 tablet 3   No current facility-administered medications on file prior to visit.    Allergies  Allergen Reactions  . Bactrim [Sulfamethoxazole-Trimethoprim]   . Epinephrine     shakey  . Erythromycin   . Sulfa Antibiotics Rash   Family History  Problem Relation Age of Onset  . Kidney disease Father        from uremia  . Benign prostatic hyperplasia Father   . Hypertension Mother   . Colon cancer Paternal Uncle   . Clotting disorder Maternal Grandmother        ????   PE: There were no vitals taken for this visit. Wt Readings from Last 3 Encounters:   10/02/18 176 lb 12.8 oz (80.2 kg)  08/26/18 179 lb (81.2 kg)  03/28/18 179 lb (81.2 kg)   Constitutional:  in NAD  The physical exam was not performed (virtual visit).  ASSESSMENT: 1. Hypothyroidism  PLAN:  1. Patient with longstanding hypothyroidism, on levothyroxine and liothyronine therapy.  She appears euthyroid.  She complains of memory loss with TSH is in the upper range of the normal interval. - latest thyroid labs reviewed with pt >> normal 03/2018 - she continues on LT4 112 + LT3 5 mcg daily (equivalent dose of 132 mcg LT4 daily) - pt feels good on this  dose. She feels well now, with no memory impairment.  She tells me that this is her indicator that her thyroid tests are at goal.  - we discussed about taking the thyroid hormone every day, with water, >30 minutes before breakfast, separated by >4 hours from acid reflux medications, calcium, iron, multivitamins. Pt. is taking it correctly. - will check thyroid tests when she returns to the clinic -I advised her to come in the next 2 months: TSH, fT3, and fT4.  She tells me she would prefer to get this done at her PCPs office, which is perfectly fine. - If labs are abnormal, she will need to return for repeat TFTs in 1.5 months - OTW, I will see her back in a year  - time spent with the patient: 15 minutes, of which >50% was spent in obtaining information about her symptoms, reviewing her previous labs, evaluations, and treatments, counseling her about her condition (please see the discussed topics above), and developing a plan to further investigate and treat it.  Orders Placed This Encounter  Procedures  . xtpit - TSH  . xtpit - free T4  . xtpit - free T3   [name], MD PhD [facility]

## 2019-01-22 ENCOUNTER — Ambulatory Visit (INDEPENDENT_AMBULATORY_CARE_PROVIDER_SITE_OTHER): Payer: Medicare Other | Admitting: Psychology

## 2019-01-22 DIAGNOSIS — F411 Generalized anxiety disorder: Secondary | ICD-10-CM

## 2019-01-28 ENCOUNTER — Ambulatory Visit (INDEPENDENT_AMBULATORY_CARE_PROVIDER_SITE_OTHER): Payer: Medicare Other | Admitting: Psychology

## 2019-01-28 DIAGNOSIS — F411 Generalized anxiety disorder: Secondary | ICD-10-CM

## 2019-02-05 ENCOUNTER — Ambulatory Visit (INDEPENDENT_AMBULATORY_CARE_PROVIDER_SITE_OTHER): Payer: Medicare Other | Admitting: Psychology

## 2019-02-05 DIAGNOSIS — F411 Generalized anxiety disorder: Secondary | ICD-10-CM

## 2019-02-12 ENCOUNTER — Ambulatory Visit (INDEPENDENT_AMBULATORY_CARE_PROVIDER_SITE_OTHER): Payer: Medicare Other | Admitting: Psychology

## 2019-02-12 DIAGNOSIS — F411 Generalized anxiety disorder: Secondary | ICD-10-CM | POA: Diagnosis not present

## 2019-02-19 ENCOUNTER — Ambulatory Visit (INDEPENDENT_AMBULATORY_CARE_PROVIDER_SITE_OTHER): Payer: Medicare Other | Admitting: Psychology

## 2019-02-19 DIAGNOSIS — F411 Generalized anxiety disorder: Secondary | ICD-10-CM

## 2019-02-26 ENCOUNTER — Ambulatory Visit (INDEPENDENT_AMBULATORY_CARE_PROVIDER_SITE_OTHER): Payer: Medicare Other | Admitting: Psychology

## 2019-02-26 DIAGNOSIS — F411 Generalized anxiety disorder: Secondary | ICD-10-CM

## 2019-02-28 ENCOUNTER — Encounter: Payer: Self-pay | Admitting: Family Medicine

## 2019-03-05 ENCOUNTER — Ambulatory Visit (INDEPENDENT_AMBULATORY_CARE_PROVIDER_SITE_OTHER): Payer: Medicare Other | Admitting: Psychology

## 2019-03-05 DIAGNOSIS — F411 Generalized anxiety disorder: Secondary | ICD-10-CM | POA: Diagnosis not present

## 2019-03-10 ENCOUNTER — Ambulatory Visit (INDEPENDENT_AMBULATORY_CARE_PROVIDER_SITE_OTHER): Payer: Medicare PPO | Admitting: Psychology

## 2019-03-10 DIAGNOSIS — F411 Generalized anxiety disorder: Secondary | ICD-10-CM | POA: Diagnosis not present

## 2019-03-14 ENCOUNTER — Encounter: Payer: Self-pay | Admitting: Family Medicine

## 2019-03-18 ENCOUNTER — Ambulatory Visit (INDEPENDENT_AMBULATORY_CARE_PROVIDER_SITE_OTHER): Payer: Medicare PPO | Admitting: Psychology

## 2019-03-18 DIAGNOSIS — F411 Generalized anxiety disorder: Secondary | ICD-10-CM

## 2019-03-19 ENCOUNTER — Other Ambulatory Visit: Payer: Self-pay | Admitting: Family Medicine

## 2019-03-19 DIAGNOSIS — I10 Essential (primary) hypertension: Secondary | ICD-10-CM

## 2019-03-19 NOTE — Telephone Encounter (Signed)
Requested medication (s) are due for refill today -yes  Requested medication (s) are on the active medication list -yes  Future visit scheduled -no  Last refill: 12/20/18  Notes to clinic: Patient passed visit protocol- but fails lab protocol- sent for review of request  Requested Prescriptions  Pending Prescriptions Disp Refills   hydrALAZINE (APRESOLINE) 25 MG tablet [Pharmacy Med Name: HYDRALAZINE  25MG  TABLETS(ORANGE)] 450 tablet 2    Sig: TAKE 2 TABLETS BY MOUTH EVERY MORNING, THEN 1 TABLET AT LUNCH AND 2 TABLETS AT DINNER      Cardiovascular:  Vasodilators Failed - 03/19/2019  1:58 PM      Failed - HCT in normal range and within 360 days    HCT  Date Value Ref Range Status  07/31/2017 45.0 36.0 - 46.0 % Final   Hematocrit  Date Value Ref Range Status  09/01/2016 39.7 34.0 - 46.6 % Final          Failed - HGB in normal range and within 360 days    Hemoglobin  Date Value Ref Range Status  07/31/2017 14.6 12.0 - 15.0 g/dL Final  09/01/2016 13.3 11.1 - 15.9 g/dL Final          Failed - RBC in normal range and within 360 days    RBC  Date Value Ref Range Status  07/31/2017 5.44 (H) 3.87 - 5.11 MIL/uL Final          Failed - WBC in normal range and within 360 days    WBC  Date Value Ref Range Status  07/31/2017 8.4 4.0 - 10.5 K/uL Final          Failed - PLT in normal range and within 360 days    Platelets  Date Value Ref Range Status  07/31/2017 268 150 - 400 K/uL Final    Comment:    Performed at [facility], [place] [address] [place], [place] [address]  09/01/2016 231 150 - 379 x10E3/uL Final   Platelet Count, POC  Date Value Ref Range Status  03/15/2016 243 142 - 424 K/uL Final          Passed - Last BP in normal range    BP Readings from Last 1 Encounters:  10/02/18 132/82          Passed - Valid encounter within last 12 months    Recent Outpatient Visits           2 months ago Need for prophylactic vaccination and inoculation against  influenza   [facility], Lilia Argue, MD   5 months ago Cyst of skin   Primary Care at [facility], Rex Kras, MD   5 months ago Essential hypertension, benign   [facility] at [facility], Lilia Argue, MD   6 months ago Medicare annual wellness visit, subsequent   Primary Care at [facility], Arlie Solomons, MD   8 months ago Essential hypertension, benign   [facility] at [facility], Lilia Argue, MD                  Requested Prescriptions  Pending Prescriptions Disp Refills   hydrALAZINE (APRESOLINE) 25 MG tablet [Pharmacy Med Name: HYDRALAZINE  25MG  TABLETS(ORANGE)] 450 tablet 2    Sig: TAKE 2 TABLETS BY MOUTH EVERY MORNING, THEN 1 TABLET AT LUNCH AND 2 TABLETS AT DINNER      Cardiovascular:  Vasodilators Failed - 03/19/2019  1:58 PM      Failed -  HCT in normal range and within 360 days    HCT  Date Value Ref Range Status  07/31/2017 45.0 36.0 - 46.0 % Final   Hematocrit  Date Value Ref Range Status  09/01/2016 39.7 34.0 - 46.6 % Final          Failed - HGB in normal range and within 360 days    Hemoglobin  Date Value Ref Range Status  07/31/2017 14.6 12.0 - 15.0 g/dL Final  09/01/2016 13.3 11.1 - 15.9 g/dL Final          Failed - RBC in normal range and within 360 days    RBC  Date Value Ref Range Status  07/31/2017 5.44 (H) 3.87 - 5.11 MIL/uL Final          Failed - WBC in normal range and within 360 days    WBC  Date Value Ref Range Status  07/31/2017 8.4 4.0 - 10.5 K/uL Final          Failed - PLT in normal range and within 360 days    Platelets  Date Value Ref Range Status  07/31/2017 268 150 - 400 K/uL Final    Comment:    Performed at [facility], [place] [address], [place], [place] [address]  09/01/2016 231 150 - 379 x10E3/uL Final   Platelet Count, POC  Date Value Ref Range Status  03/15/2016 243 142 - 424 K/uL Final          Passed - Last BP in normal range    BP Readings from Last 1 Encounters:  10/02/18 132/82           Passed - Valid encounter within last 12 months    Recent Outpatient Visits           2 months ago Need for prophylactic vaccination and inoculation against influenza   [facility], Lilia Argue, MD   5 months ago Cyst of skin   Primary Care at [facility], Rex Kras, MD   5 months ago Essential hypertension, benign   [facility] at [facility], Lilia Argue, MD   6 months ago Medicare annual wellness visit, subsequent   Primary Care at [facility], Arlie Solomons, MD   8 months ago Essential hypertension, benign   [facility] at [facility], Lilia Argue, MD

## 2019-03-19 NOTE — Telephone Encounter (Signed)
No further refills without office visit 

## 2019-03-25 ENCOUNTER — Ambulatory Visit (INDEPENDENT_AMBULATORY_CARE_PROVIDER_SITE_OTHER): Payer: Medicare PPO | Admitting: Psychology

## 2019-03-25 DIAGNOSIS — F411 Generalized anxiety disorder: Secondary | ICD-10-CM

## 2019-04-01 ENCOUNTER — Ambulatory Visit: Payer: Medicare Other | Admitting: Psychology

## 2019-04-05 ENCOUNTER — Ambulatory Visit: Payer: BLUE CROSS/BLUE SHIELD

## 2019-04-12 ENCOUNTER — Ambulatory Visit: Payer: Medicare PPO | Attending: Internal Medicine

## 2019-04-12 DIAGNOSIS — Z23 Encounter for immunization: Secondary | ICD-10-CM

## 2019-04-12 NOTE — Progress Notes (Signed)
   Covid-19 Vaccination Clinic  Name:  [name]    MRN: [id number] DOB: 1945-05-23  04/12/2019  [name] was observed post Covid-19 immunization for 15 minutes without incidence. She was provided with Vaccine Information Sheet and instruction to access the V-Safe system.   [name] was instructed to call 911 with any severe reactions post vaccine: Marland Kitchen Difficulty breathing  . Swelling of your face and throat  . A fast heartbeat  . A bad rash all over your body  . Dizziness and weakness    Immunizations Administered    Name Date Dose VIS Date Route   Pfizer COVID-19 Vaccine 04/12/2019  4:16 PM 0.3 mL 02/14/2019 Intramuscular   Manufacturer: [place]   Lot: [id number]   [place]: [id number]

## 2019-04-16 ENCOUNTER — Ambulatory Visit: Payer: BLUE CROSS/BLUE SHIELD

## 2019-04-22 ENCOUNTER — Ambulatory Visit: Payer: Medicare PPO | Admitting: Psychology

## 2019-04-29 ENCOUNTER — Ambulatory Visit: Payer: Medicare PPO | Admitting: Psychology

## 2019-05-06 ENCOUNTER — Ambulatory Visit (INDEPENDENT_AMBULATORY_CARE_PROVIDER_SITE_OTHER): Payer: Medicare PPO | Admitting: Psychology

## 2019-05-06 DIAGNOSIS — F411 Generalized anxiety disorder: Secondary | ICD-10-CM | POA: Diagnosis not present

## 2019-05-07 ENCOUNTER — Ambulatory Visit: Payer: Medicare PPO | Attending: Internal Medicine

## 2019-05-07 DIAGNOSIS — Z23 Encounter for immunization: Secondary | ICD-10-CM | POA: Insufficient documentation

## 2019-05-07 NOTE — Progress Notes (Signed)
   Covid-19 Vaccination Clinic  Name:  [name]    MRN: [id number] DOB: 1945-07-02  05/07/2019  [name] was observed post Covid-19 immunization for 30 minutes based on pre-vaccination screening without incident. She was provided with Vaccine Information Sheet and instruction to access the V-Safe system.   [name] was instructed to call 911 with any severe reactions post vaccine: Marland Kitchen Difficulty breathing  . Swelling of face and throat  . A fast heartbeat  . A bad rash all over body  . Dizziness and weakness   Immunizations Administered    Name Date Dose VIS Date Route   Pfizer COVID-19 Vaccine 05/07/2019  1:55 PM 0.3 mL 02/14/2019 Intramuscular   Manufacturer: [place]   Lot: [id number]   [place]: [id number]

## 2019-05-14 ENCOUNTER — Ambulatory Visit (INDEPENDENT_AMBULATORY_CARE_PROVIDER_SITE_OTHER): Payer: Medicare PPO | Admitting: Psychology

## 2019-05-14 DIAGNOSIS — F411 Generalized anxiety disorder: Secondary | ICD-10-CM | POA: Diagnosis not present

## 2019-05-21 ENCOUNTER — Ambulatory Visit (INDEPENDENT_AMBULATORY_CARE_PROVIDER_SITE_OTHER): Payer: Medicare PPO | Admitting: Psychology

## 2019-05-21 DIAGNOSIS — F411 Generalized anxiety disorder: Secondary | ICD-10-CM

## 2019-05-28 ENCOUNTER — Ambulatory Visit (INDEPENDENT_AMBULATORY_CARE_PROVIDER_SITE_OTHER): Payer: Medicare PPO | Admitting: Psychology

## 2019-05-28 DIAGNOSIS — F319 Bipolar disorder, unspecified: Secondary | ICD-10-CM | POA: Diagnosis not present

## 2019-06-02 DIAGNOSIS — G4733 Obstructive sleep apnea (adult) (pediatric): Secondary | ICD-10-CM | POA: Diagnosis not present

## 2019-06-04 ENCOUNTER — Ambulatory Visit: Payer: Medicare PPO | Admitting: Psychology

## 2019-06-05 ENCOUNTER — Ambulatory Visit (INDEPENDENT_AMBULATORY_CARE_PROVIDER_SITE_OTHER): Payer: Medicare PPO | Admitting: Psychology

## 2019-06-05 DIAGNOSIS — F411 Generalized anxiety disorder: Secondary | ICD-10-CM | POA: Diagnosis not present

## 2019-06-10 ENCOUNTER — Ambulatory Visit (INDEPENDENT_AMBULATORY_CARE_PROVIDER_SITE_OTHER): Payer: Medicare PPO | Admitting: Psychology

## 2019-06-10 DIAGNOSIS — F319 Bipolar disorder, unspecified: Secondary | ICD-10-CM | POA: Diagnosis not present

## 2019-06-14 DIAGNOSIS — S80212A Abrasion, left knee, initial encounter: Secondary | ICD-10-CM | POA: Diagnosis not present

## 2019-06-14 DIAGNOSIS — S63502A Unspecified sprain of left wrist, initial encounter: Secondary | ICD-10-CM | POA: Diagnosis not present

## 2019-06-14 DIAGNOSIS — S0031XA Abrasion of nose, initial encounter: Secondary | ICD-10-CM | POA: Diagnosis not present

## 2019-06-14 DIAGNOSIS — S50312A Abrasion of left elbow, initial encounter: Secondary | ICD-10-CM | POA: Diagnosis not present

## 2019-06-18 ENCOUNTER — Other Ambulatory Visit: Payer: Self-pay | Admitting: Family Medicine

## 2019-06-18 DIAGNOSIS — I10 Essential (primary) hypertension: Secondary | ICD-10-CM

## 2019-06-18 NOTE — Telephone Encounter (Signed)
Please assist in calling pt to schedule appt for med refills and chronic condition f/u.   Medication can be refill after scheduled   Attempted to call pt no answer.

## 2019-06-19 NOTE — Telephone Encounter (Signed)
Pt has upcoming appt on 06/23/19

## 2019-06-23 ENCOUNTER — Ambulatory Visit (INDEPENDENT_AMBULATORY_CARE_PROVIDER_SITE_OTHER): Payer: Medicare PPO | Admitting: Family Medicine

## 2019-06-23 ENCOUNTER — Other Ambulatory Visit: Payer: Self-pay

## 2019-06-23 ENCOUNTER — Encounter: Payer: Self-pay | Admitting: Family Medicine

## 2019-06-23 VITALS — BP 112/74 | HR 61 | Temp 97.7°F | Ht 61.5 in | Wt 182.0 lb

## 2019-06-23 DIAGNOSIS — I10 Essential (primary) hypertension: Secondary | ICD-10-CM

## 2019-06-23 DIAGNOSIS — R296 Repeated falls: Secondary | ICD-10-CM | POA: Diagnosis not present

## 2019-06-23 DIAGNOSIS — E039 Hypothyroidism, unspecified: Secondary | ICD-10-CM | POA: Diagnosis not present

## 2019-06-23 MED ORDER — BENAZEPRIL-HYDROCHLOROTHIAZIDE 20-12.5 MG PO TABS: 2.0000 | ORAL_TABLET | Freq: Every day | ORAL | 1 refills | Status: DC

## 2019-06-23 MED ORDER — HYDRALAZINE HCL 25 MG PO TABS: ORAL_TABLET | ORAL | 0 refills | Status: DC

## 2019-06-23 NOTE — Progress Notes (Signed)
4/19/20212:26 PM  [name] 1945-07-20, 74 y.o., female [id number]  Chief Complaint  Patient presents with  . Fall    4/10 walked off curve and lost balance / bruised L arm/ fast med did xrays   . balance    discuss options to help / check fasting labs- future    HPI:   Patient is a 73 y.o. female with past medical history significant for HTN, hypothyroidism, anxiety who presents today for balance concerns  Patent reports that she has had 3 falls in the past month They have all been related to either stepping down a curb/step or walking on uneven surface She denies any injuries or LOC She denies any associated dizziness, vision changes, focal weakness, chest pain, palpitations, SOB She denies any chronic low back, hip or knee pain Sees endo for hypothyroidism  Depression screen [facility] 2/9 06/23/2019 10/02/2018 08/26/2018  Decreased Interest 0 0 0  Down, Depressed, Hopeless 0 0 0  PHQ - 2 Score 0 0 0  Altered sleeping - - -  Tired, decreased energy - - -  Change in appetite - - -  Feeling bad or failure about yourself  - - -  Trouble concentrating - - -  Moving slowly or fidgety/restless - - -  Suicidal thoughts - - -  PHQ-9 Score - - -    Fall Risk  06/23/2019 10/02/2018 08/26/2018 06/26/2018 03/28/2018  Falls in the past year? 1 0 0 0 0  Number falls in past yr: 1 0 0 - -  Injury with Fall? 0 0 0 - -  Follow up Falls evaluation completed Falls evaluation completed Falls evaluation completed;Education provided;Falls prevention discussed - -     Allergies  Allergen Reactions  . Bactrim [Sulfamethoxazole-Trimethoprim]   . Epinephrine     shakey  . Erythromycin   . Sulfa Antibiotics Rash    Prior to Admission medications   Medication Sig Start Date End Date Taking? Authorizing Provider  Ascorbic Acid (VITAMIN C) 1000 MG tablet Take 1,000 mg by mouth daily. One tablet a day   Yes [provider]  benazepril-hydrochlorthiazide (LOTENSIN HCT) 20-12.5 MG  tablet TAKE 2 TABLETS BY MOUTH DAILY 12/20/18  Yes [name], MD  hydrALAZINE (APRESOLINE) 25 MG tablet TAKE 2 TABLETS BY MOUTH EVERY MORNING, THEN 1 TABLET AT LUNCH AND 2 TABLETS AT [facility] 03/19/19  Yes [name], MD  levothyroxine (SYNTHROID) 112 MCG tablet TAKE 1 TABLET BY MOUTH EVERY DAY BEFORE BREAKFAST 12/20/18  Yes [name], MD  liothyronine (CYTOMEL) 5 MCG tablet TAKE 1 TABLET BY MOUTH EVERY DAY FOR THYROID 12/20/18  Yes [name], MD  metoprolol succinate (TOPROL-XL) 25 MG 24 hr tablet Take 1 tablet (25 mg total) by mouth daily. 09/23/18  Yes [name], MD    Past Medical History:  Diagnosis Date  . Abnormal perimenopausal bleeding 2005  . Allergy   . Anemia   . Anxiety   . Dysuria 2004  . Endometrial polyp 2006  . Environmental allergies   . Fibroid 02/19/03  . H/O varicella   . History of measles, mumps, or rubella   . Hx of colonic polyps 12/15/2010  . Hypertension 07-19-2010   echo for pre-op EF 55%  normal left wall thickness LA mildly dialated with mitral and tricuspid regurgatation  . Hypothyroidism   . Interstitial cystitis   . Menopausal symptoms 2004  . Obesity   . Osteopenia 02/2012   -1.8 T score right femur neck  .  PMB (postmenopausal bleeding) 06/21/10  . Sleep apnea   . Urinary incontinence 2011  . Vitamin D deficiency    history of    Past Surgical History:  Procedure Laterality Date  . COLONOSCOPY  12/15/10  . HYSTEROSCOPY  2006 and May 2012   for fibroids, endometrial polyps  . MOUTH SURGERY    . MYOMECTOMY  1974    Social History   Tobacco Use  . Smoking status: Never Smoker  . Smokeless tobacco: Never Used  Substance Use Topics  . Alcohol use: Yes    Alcohol/week: 1.0 standard drinks    Types: 1 Glasses of wine per week    Comment: occasional (1-2 times per week)    Family History  Problem Relation Age of Onset  . Kidney disease Father        from uremia  . Benign prostatic hyperplasia Father    . Hypertension Mother   . Colon cancer Paternal Uncle   . Clotting disorder Maternal Grandmother        ????    ROS Per hpi  OBJECTIVE:  Today's Vitals   06/23/19 1346  BP: 132/80  Pulse: 61  Temp: 97.7 F (36.5 C)  SpO2: 98%  Weight: 182 lb (82.6 kg)  Height: 5' 1.5" (1.562 m)   Body mass index is 33.83 kg/m.   Physical Exam Vitals and nursing note reviewed.  Constitutional:      Appearance: She is well-developed.  HENT:     Head: Normocephalic and atraumatic.     Right Ear: Hearing, tympanic membrane, ear canal and external ear normal.     Left Ear: Hearing, tympanic membrane, ear canal and external ear normal.     Mouth/Throat:     Mouth: Mucous membranes are moist.     Pharynx: No oropharyngeal exudate or posterior oropharyngeal erythema.  Eyes:     Extraocular Movements: Extraocular movements intact.     Conjunctiva/sclera: Conjunctivae normal.     Pupils: Pupils are equal, round, and reactive to light.  Neck:     Thyroid: No thyromegaly.  Cardiovascular:     Rate and Rhythm: Normal rate and regular rhythm.     Heart sounds: Normal heart sounds. No murmur. No friction rub. No gallop.   Pulmonary:     Effort: Pulmonary effort is normal.     Breath sounds: Normal breath sounds. No wheezing, rhonchi or rales.  Abdominal:     General: Bowel sounds are normal. There is no distension.     Palpations: Abdomen is soft. There is no hepatomegaly, splenomegaly or mass.     Tenderness: There is no abdominal tenderness.  Musculoskeletal:        General: Normal range of motion.     Cervical back: Neck supple.     Right lower leg: No edema.     Left lower leg: No edema.  Lymphadenopathy:     Cervical: No cervical adenopathy.  Skin:    General: Skin is warm and dry.  Neurological:     Mental Status: She is alert and oriented to person, place, and time.     Cranial Nerves: No cranial nerve deficit.     Sensory: No sensory deficit.     Motor: No weakness.      Coordination: Coordination normal.     Gait: Gait abnormal (wide based).     Deep Tendon Reflexes: Reflexes are normal and symmetric.  Psychiatric:        Mood and Affect: Mood normal.  Behavior: Behavior normal.     Patient did not tolerate orthostatics BP as did not want to lie down flat My interpretation of EKG:  Sinus, HR 53, no st changes  No results found for this or any previous visit (from the past 24 hour(s)).  No results found.   ASSESSMENT and PLAN  1. Essential hypertension, benign Controlled. Continue current regime.  - benazepril-hydrochlorthiazide (LOTENSIN HCT) 20-12.5 MG tablet; Take 2 tablets by mouth daily. - hydrALAZINE (APRESOLINE) 25 MG tablet; TAKE 2 TABLETS BY MOUTH EVERY MORNING, THEN 1 TABLET AT LUNCH AND 2 TABLETS AT DINNER - CMP14+EGFR - CBC - EKG 12-Lead - Urinalysis, Routine w reflex microscopic  2. Hypothyroidism, unspecified type Managed by endo, labs cc'd - TSH - T4, Free - T3, Free  3. Recurrent falls while walking Patient denies concerning associated symptoms, referring to PT for further balance and gait training. RTC precautions reviewed. - Ambulatory referral to Physical Therapy  Return in about 3 months (around 09/22/2019).    [name], MD Primary Care at [place], [place] [address] Ph.  [phone] Fax [phone]

## 2019-06-23 NOTE — Patient Instructions (Signed)
° ° ° °  If you have lab work done today you will be contacted with your lab results within the next 2 weeks.  If you have not heard from us then please contact us. The fastest way to get your results is to register for My Chart. ° ° °IF you received an x-ray today, you will receive an invoice from [facility]. Please contact [facility] at [phone] with questions or concerns regarding your invoice.  ° °IF you received labwork today, you will receive an invoice from [facility]. Please contact [facility] at [phone] with questions or concerns regarding your invoice.  ° °Our billing staff will not be able to assist you with questions regarding bills from these companies. ° °You will be contacted with the lab results as soon as they are available. The fastest way to get your results is to activate your My Chart account. Instructions are located on the last page of this paperwork. If you have not heard from us regarding the results in 2 weeks, please contact this office. °  ° ° ° °

## 2019-06-24 ENCOUNTER — Ambulatory Visit: Payer: Medicare PPO | Admitting: Psychology

## 2019-06-24 LAB — URINALYSIS, ROUTINE W REFLEX MICROSCOPIC
Bilirubin, UA: NEGATIVE
Glucose, UA: NEGATIVE
Ketones, UA: NEGATIVE
Leukocytes,UA: NEGATIVE
Nitrite, UA: NEGATIVE
Protein,UA: NEGATIVE
RBC, UA: NEGATIVE
Specific Gravity, UA: 1.013 (ref 1.005–1.030)
Urobilinogen, Ur: 0.2 mg/dL (ref 0.2–1.0)
pH, UA: 7 (ref 5.0–7.5)

## 2019-06-24 LAB — CBC
Hematocrit: 42.6 % (ref 34.0–46.6)
Hemoglobin: 14.2 g/dL (ref 11.1–15.9)
MCH: 27.4 pg (ref 26.6–33.0)
MCHC: 33.3 g/dL (ref 31.5–35.7)
MCV: 82 fL (ref 79–97)
Platelets: 266 10*3/uL (ref 150–450)
RBC: 5.18 x10E6/uL (ref 3.77–5.28)
RDW: 13.7 % (ref 11.7–15.4)
WBC: 8.5 10*3/uL (ref 3.4–10.8)

## 2019-06-24 LAB — CMP14+EGFR
ALT: 15 IU/L (ref 0–32)
AST: 15 IU/L (ref 0–40)
Albumin/Globulin Ratio: 1.7 (ref 1.2–2.2)
Albumin: 4.4 g/dL (ref 3.7–4.7)
Alkaline Phosphatase: 66 IU/L (ref 39–117)
BUN/Creatinine Ratio: 24 (ref 12–28)
BUN: 17 mg/dL (ref 8–27)
Bilirubin Total: 0.3 mg/dL (ref 0.0–1.2)
CO2: 27 mmol/L (ref 20–29)
Calcium: 9.5 mg/dL (ref 8.7–10.3)
Chloride: 101 mmol/L (ref 96–106)
Creatinine, Ser: 0.7 mg/dL (ref 0.57–1.00)
GFR calc Af Amer: 99 mL/min/{1.73_m2} (ref >59)
GFR calc non Af Amer: 86 mL/min/{1.73_m2} (ref >59)
Globulin, Total: 2.6 g/dL (ref 1.5–4.5)
Glucose: 86 mg/dL (ref 65–99)
Potassium: 3.8 mmol/L (ref 3.5–5.2)
Sodium: 144 mmol/L (ref 134–144)
Total Protein: 7 g/dL (ref 6.0–8.5)

## 2019-06-24 LAB — TSH: TSH: 1.88 u[IU]/mL (ref 0.450–4.500)

## 2019-06-24 LAB — T3, FREE: T3, Free: 2.9 pg/mL (ref 2.0–4.4)

## 2019-06-24 LAB — T4, FREE: Free T4: 1.46 ng/dL (ref 0.82–1.77)

## 2019-06-26 DIAGNOSIS — Z96649 Presence of unspecified artificial hip joint: Secondary | ICD-10-CM | POA: Diagnosis not present

## 2019-06-26 DIAGNOSIS — R32 Unspecified urinary incontinence: Secondary | ICD-10-CM | POA: Diagnosis not present

## 2019-06-26 DIAGNOSIS — E669 Obesity, unspecified: Secondary | ICD-10-CM | POA: Diagnosis not present

## 2019-06-26 DIAGNOSIS — E039 Hypothyroidism, unspecified: Secondary | ICD-10-CM | POA: Diagnosis not present

## 2019-06-26 DIAGNOSIS — I10 Essential (primary) hypertension: Secondary | ICD-10-CM | POA: Diagnosis not present

## 2019-06-26 DIAGNOSIS — Z8249 Family history of ischemic heart disease and other diseases of the circulatory system: Secondary | ICD-10-CM | POA: Diagnosis not present

## 2019-06-26 DIAGNOSIS — Z882 Allergy status to sulfonamides status: Secondary | ICD-10-CM | POA: Diagnosis not present

## 2019-06-26 DIAGNOSIS — Z6833 Body mass index (BMI) 33.0-33.9, adult: Secondary | ICD-10-CM | POA: Diagnosis not present

## 2019-06-27 DIAGNOSIS — L600 Ingrowing nail: Secondary | ICD-10-CM | POA: Diagnosis not present

## 2019-07-01 ENCOUNTER — Ambulatory Visit (INDEPENDENT_AMBULATORY_CARE_PROVIDER_SITE_OTHER): Payer: Medicare PPO | Admitting: Psychology

## 2019-07-01 DIAGNOSIS — F319 Bipolar disorder, unspecified: Secondary | ICD-10-CM

## 2019-07-07 ENCOUNTER — Ambulatory Visit: Payer: Medicare PPO | Admitting: Physical Therapy

## 2019-07-08 ENCOUNTER — Other Ambulatory Visit: Payer: Self-pay | Admitting: Family Medicine

## 2019-07-08 ENCOUNTER — Ambulatory Visit (INDEPENDENT_AMBULATORY_CARE_PROVIDER_SITE_OTHER): Payer: Medicare PPO | Admitting: Psychology

## 2019-07-08 DIAGNOSIS — F319 Bipolar disorder, unspecified: Secondary | ICD-10-CM | POA: Diagnosis not present

## 2019-07-08 NOTE — Telephone Encounter (Signed)
Requested Prescriptions  Pending Prescriptions Disp Refills  . metoprolol succinate (TOPROL-XL) 25 MG 24 hr tablet [Pharmacy Med Name: METOPROLOL ER SUCCINATE 25MG  TABS] 90 tablet 1    Sig: TAKE 1 TABLET(25 MG) BY MOUTH DAILY     Cardiovascular:  Beta Blockers Passed - 07/08/2019  8:24 PM      Passed - Last BP in normal range    BP Readings from Last 1 Encounters:  06/23/19 112/74         Passed - Last Heart Rate in normal range    Pulse Readings from Last 1 Encounters:  06/23/19 61         Passed - Valid encounter within last 6 months    Recent Outpatient Visits          2 weeks ago Essential hypertension, benign   [facility], Lilia Argue, MD   6 months ago Need for prophylactic vaccination and inoculation against influenza   [facility] at [facility], Lilia Argue, MD   9 months ago Cyst of skin   Primary Care at [facility], Rex Kras, MD   9 months ago Essential hypertension, benign   [facility] at [facility], Lilia Argue, MD   10 months ago Medicare annual wellness visit, subsequent   Primary Care at [facility], Arlie Solomons, MD      Future Appointments            In 2 months [name], MD Primary Care at [place]

## 2019-07-10 ENCOUNTER — Ambulatory Visit: Payer: Medicare PPO

## 2019-08-11 ENCOUNTER — Ambulatory Visit (INDEPENDENT_AMBULATORY_CARE_PROVIDER_SITE_OTHER): Payer: Medicare PPO | Admitting: Psychology

## 2019-08-11 DIAGNOSIS — F319 Bipolar disorder, unspecified: Secondary | ICD-10-CM

## 2019-08-12 ENCOUNTER — Other Ambulatory Visit: Payer: Self-pay

## 2019-08-12 ENCOUNTER — Ambulatory Visit: Payer: Medicare PPO | Attending: Family Medicine | Admitting: Physical Therapy

## 2019-08-12 DIAGNOSIS — Z9181 History of falling: Secondary | ICD-10-CM | POA: Diagnosis not present

## 2019-08-12 DIAGNOSIS — M6281 Muscle weakness (generalized): Secondary | ICD-10-CM

## 2019-08-12 DIAGNOSIS — M25661 Stiffness of right knee, not elsewhere classified: Secondary | ICD-10-CM | POA: Diagnosis not present

## 2019-08-12 DIAGNOSIS — M25662 Stiffness of left knee, not elsewhere classified: Secondary | ICD-10-CM | POA: Diagnosis not present

## 2019-08-12 DIAGNOSIS — R296 Repeated falls: Secondary | ICD-10-CM | POA: Insufficient documentation

## 2019-08-12 NOTE — Patient Instructions (Signed)
Access Code: [email or web address] URL: [email or web address] Date: 08/12/2019 Prepared by: Estill Bamberg April Thurnell Garbe  Exercises Hip Abduction with Resistance Loop - 1 x daily - 7 x weekly - 2 sets - 10 reps Hip Extension with Resistance Loop - 1 x daily - 7 x weekly - 2 sets - 10 reps Seated Anterior Tibialis Stretch - 1 x daily - 7 x weekly - 3 sets - 30 hold Heel rises with counter support - 1 x daily - 7 x weekly - 2 sets - 10 reps Romberg Stance - 1 x daily - 7 x weekly - 3 sets - 30 sec hold

## 2019-08-12 NOTE — Therapy (Signed)
[place], [place], [address] Phone: [phone]   Fax:  [phone]  Physical Therapy Evaluation  Patient Details  Name: [name] MRN: [id number] Date of Birth: 1945/08/04 Referring Provider (PT): [name], MD   Encounter Date: 08/12/2019  PT End of Session - 08/12/19 1406    Visit Number  1    Number of Visits  [place] pending    Authorization - Visit Number  1    Authorization - Number of Visits  16    PT Start Time  2563    PT Stop Time  1452    PT Time Calculation (min)  46 min    Activity Tolerance  Patient tolerated treatment well    Behavior During Therapy  [facility] for tasks assessed/performed       Past Medical History:  Diagnosis Date  . Abnormal perimenopausal bleeding 2005  . Allergy   . Anemia   . Anxiety   . Dysuria 2004  . Endometrial polyp 2006  . Environmental allergies   . Fibroid 02/19/03  . H/O varicella   . History of measles, mumps, or rubella   . Hx of colonic polyps 12/15/2010  . Hypertension 07-19-2010   echo for pre-op EF 55%  normal left wall thickness LA mildly dialated with mitral and tricuspid regurgatation  . Hypothyroidism   . Interstitial cystitis   . Menopausal symptoms 2004  . Obesity   . Osteopenia 02/2012   -1.8 T score right femur neck  . PMB (postmenopausal bleeding) 06/21/10  . Sleep apnea   . Urinary incontinence 2011  . Vitamin D deficiency    history of    Past Surgical History:  Procedure Laterality Date  . COLONOSCOPY  12/15/10  . HYSTEROSCOPY  2006 and May 2012   for fibroids, endometrial polyps  . MOUTH SURGERY    . MYOMECTOMY  1974    There were no vitals filed for this visit.   Subjective Assessment - 08/12/19 1408    Subjective  Pt reports losing balance off curb while stepping off on Apr 10 and hurting her wrist. Pt reports 2-3 recurrent falls within the last 6 months. She reports  fear and anxiety from the falls and gives her concern for future falls. Pt states she was participating in silver sneakers and doing exercise and chair yoga but since COVID she has reduced her activity level. Pt reports chronic knee pain and OA further limiting her mobility.    Patient Stated Goals  Decrease falls    Currently in Pain?  No/denies         [facility] PT Assessment - 08/12/19 0001      Assessment   Medical Diagnosis  R29.6 (ICD-10-CM) - Recurrent falls while walking    Referring Provider (PT)  [name], MD    Next MD Visit  09/22/19    Prior Therapy  None      Balance Screen   Has the patient fallen in the past 6 months  Yes    How many times?  2-3    Has the patient had a decrease in activity level because of a fear of falling?   Yes    Is the patient reluctant to leave their home because of a fear of falling?   Yes      [place]  Other relatives    Available Help at Discharge  Family    Type of Home  House      Prior Function   Level of Independence  Independent      Single Leg Stance   Comments  4 sec on L, 3 sec on R      Strength   Right Hip Flexion  3+/5    Right Hip Extension  4-/5    Right Hip ABduction  3+/5    Left Hip Flexion  3+/5    Left Hip Extension  4-/5    Left Hip ABduction  3+/5    Right Knee Flexion  4+/5    Right Knee Extension  5/5    Left Knee Flexion  4+/5    Left Knee Extension  5/5    Right Ankle Dorsiflexion  5/5    Right Ankle Plantar Flexion  3+/5    Left Ankle Dorsiflexion  5/5    Left Ankle Plantar Flexion  3+/5      Palpation   Palpation comment  TTP medial and lateral tibia      Special Tests   Hip Special Tests   Ober's Test      Ober's Test   Findings  Negative      Transfers   Supine to Sit  4: Min guard    Sit to Supine  4: Min guard      Ambulation/Gait   Ambulation/Gait  Yes    Ambulation/Gait Assistance  5: Supervision     Ambulation Distance (Feet)  50 Feet    Assistive device  None    Gait Pattern  Decreased weight shift to right;Wide base of support   hips externally rotated bilaterally     Static Standing Balance   Static Standing - Balance Support  Bilateral upper extremity supported    Static Standing - Level of Assistance  5: Stand by assistance    Static Standing Balance -  Activities   Single Leg Stance - Right Leg;Single Leg Stance - Left Leg;Tandam Stance - Right Leg;Tandam Stance - Left Leg;Romberg - Eyes Opened;Romberg - Eyes Closed;Romberg - Eyes Opened, Foam;Romberg - Eyes Closed , Foam   15 sec on foam feet apart; 6 sec on foam feet together   Static Standing - Comment/# of Minutes  20 sec; 19 sec on L tandem stance                  Objective measurements completed on examination: See above findings.      [facility] Adult PT Treatment/Exercise - 08/12/19 0001      Neuro Re-ed    Neuro Re-ed Details   Standing on foam feet together x 20 sec      Knee/Hip Exercises: Stretches   Sports administrator  Both;30 seconds   In seated     Knee/Hip Exercises: Standing   Heel Raises  10 reps    Hip Abduction  Both;Stengthening;10 reps   Red tband   Hip Extension  Stengthening;Both;10 reps   Red t-band            PT Education - 08/12/19 1456    Education Details  Discussed improving balance, hip/knee/ankle stability and strengthening.    Person(s) Educated  Patient    Methods  Explanation;Demonstration;Handout    Comprehension  Verbalized understanding;Returned demonstration;Verbal cues required;Need further instruction       PT Short Term Goals - 08/12/19 1515      PT SHORT TERM GOAL #1  Title  Pt will be independent with initial HEP    Time  4    Period  Weeks    Status  New    Target Date  09/09/19      PT SHORT TERM GOAL #2   Title  Pt will improve bilat hip strength to at least 4/5    Baseline  3+/5 hip flexion and abduction, 4-/5 hip extension bilaterally    Time  4     Period  Weeks    Status  New    Target Date  09/09/19      PT SHORT TERM GOAL #3   Title  Pt will have improved SLS to at least 8 sec bilaterally for improved step/stair negotiation    Baseline  4 sec on L, 3 sec on R    Time  4    Period  Weeks    Status  New    Target Date  09/09/19      PT SHORT TERM GOAL #4   Title  Pt will improve stance time on foam with feet together to at least 20 sec with no LOB    Baseline  6 sec before LOB    Time  4    Period  Weeks    Status  New    Target Date  09/09/19        PT Long Term Goals - 08/12/19 2017      PT LONG TERM GOAL #1   Title  Pt will be independent with HEP    Time  8    Period  Weeks    Status  New    Target Date  10/07/19      PT LONG TERM GOAL #2   Title  Pt will improve bilat hip strength to 5/5 in all directions    Time  8    Period  Weeks    Status  New    Target Date  10/07/19      PT LONG TERM GOAL #3   Title  Pt will be able to negotiate 5 steps without use of HR    Baseline  Unable    Time  8    Period  Weeks    Status  New    Target Date  10/07/19      PT LONG TERM GOAL #4   Title  Pt will improve SLS to 10 sec bilaterally    Time  8    Period  Weeks    Status  New    Target Date  10/07/19             Plan - 08/12/19 1457    Clinical Impression Statement  Pt is a 74 y/o F presenting to OPPT due to recent falls. Pt with concerns of future falls especially with stair and curb negotiation. Pt with chronic knee pain limiting mobility. Pt presents with decreased balance with activities requiring narrow BOS and compliant surfaces, decreased hip strength, lateral and medial knee TTP, and decreased activity tolerance affecting pt's community mobility, stair/step negotiation, and increasing her risk of falls. Pt would benefit from therapy to address these issues.    Personal Factors and Comorbidities  Age;Fitness;Comorbidity 1    Comorbidities  osteoarthritis    Examination-Activity Limitations   Locomotion Level;Squat;Stairs    Examination-Participation Restrictions  Community Activity;Yard Work    Stability/Clinical Decision Making  Stable/Uncomplicated    Designer, jewellery  Low    Rehab Potential  Good    PT Frequency  1x / week    PT Duration  8 weeks    PT Treatment/Interventions  ADLs/Self Care Home Management;Aquatic Therapy;Cryotherapy;Electrical Stimulation;Iontophoresis 4mg /ml Dexamethasone;Moist Heat;Gait training;Stair training;Functional mobility training;Therapeutic activities;Therapeutic exercise;Balance training;Neuromuscular re-education;Patient/family education;Manual techniques;Dry needling;Taping    PT Next Visit Plan  Continue hip strengthening. Progress pt with exercises involving steps and single leg stance/narrow BOS.    PT Home Exercise Plan  Access Code [email or web address]    Consulted and Agree with Plan of Care  Patient       Patient will benefit from skilled therapeutic intervention in order to improve the following deficits and impairments:  Abnormal gait, Difficulty walking, Decreased balance, Improper body mechanics, Decreased mobility, Decreased strength, Increased edema, Postural dysfunction, Hypomobility, Decreased activity tolerance  Visit Diagnosis: Repeated falls  History of falling  Muscle weakness (generalized)  Stiffness of left knee, not elsewhere classified  Stiffness of right knee, not elsewhere classified     Problem List Patient Active Problem List   Diagnosis Date Noted  . Cyst of skin 10/02/2018  . Obstructive sleep apnea treated with continuous positive airway pressure (CPAP) 07/02/2017  . Osteopenia 09/01/2016  . Urinary incontinence in female 09/01/2016  . Vitamin D deficiency 09/01/2016  . BMI 35.0-35.9,adult 11/16/2015  . Essential hypertension, benign 04/28/2012  . Osteoarthritis of left knee 04/28/2012  . Hypothyroidism 04/28/2012  . Hx of colonic polyps 12/15/2010  . Anxiety 11/17/2010    Ayomide Purdy April Ma L  Chanse Kagel PT, DPT 08/12/2019, 8:22 PM  [facility] [address] [place], [place], [address] Phone: [phone]   Fax:  [phone]  Name: VERENA SHAWGO MRN: [id number] Date of Birth: 1945-11-21

## 2019-08-19 ENCOUNTER — Ambulatory Visit (INDEPENDENT_AMBULATORY_CARE_PROVIDER_SITE_OTHER): Payer: Medicare PPO | Admitting: Psychology

## 2019-08-19 DIAGNOSIS — F319 Bipolar disorder, unspecified: Secondary | ICD-10-CM

## 2019-08-27 ENCOUNTER — Ambulatory Visit: Payer: Medicare PPO | Admitting: Physical Therapy

## 2019-08-27 ENCOUNTER — Other Ambulatory Visit: Payer: Self-pay

## 2019-08-27 DIAGNOSIS — M6281 Muscle weakness (generalized): Secondary | ICD-10-CM

## 2019-08-27 DIAGNOSIS — R296 Repeated falls: Secondary | ICD-10-CM

## 2019-08-27 DIAGNOSIS — Z9181 History of falling: Secondary | ICD-10-CM

## 2019-08-27 DIAGNOSIS — M25662 Stiffness of left knee, not elsewhere classified: Secondary | ICD-10-CM

## 2019-08-27 DIAGNOSIS — M25661 Stiffness of right knee, not elsewhere classified: Secondary | ICD-10-CM | POA: Diagnosis not present

## 2019-08-27 NOTE — Therapy (Signed)
[place], [place], [address] Phone: [phone]   Fax:  [phone]  Physical Therapy Treatment  Patient Details  Name: [name] MRN: [id number] Date of Birth: Nov 15, 1945 Referring Provider (PT): [name], MD   Encounter Date: 08/27/2019   PT End of Session - 08/27/19 1308    Visit Number 2    Number of Visits [place] pending    Authorization - Visit Number 1    Authorization - Number of Visits 16    PT Start Time 1050    PT Stop Time 1134    PT Time Calculation (min) 44 min    Activity Tolerance Patient tolerated treatment well    Behavior During Therapy [facility] for tasks assessed/performed           Past Medical History:  Diagnosis Date  . Abnormal perimenopausal bleeding 2005  . Allergy   . Anemia   . Anxiety   . Dysuria 2004  . Endometrial polyp 2006  . Environmental allergies   . Fibroid 02/19/03  . H/O varicella   . History of measles, mumps, or rubella   . Hx of colonic polyps 12/15/2010  . Hypertension 07-19-2010   echo for pre-op EF 55%  normal left wall thickness LA mildly dialated with mitral and tricuspid regurgatation  . Hypothyroidism   . Interstitial cystitis   . Menopausal symptoms 2004  . Obesity   . Osteopenia 02/2012   -1.8 T score right femur neck  . PMB (postmenopausal bleeding) 06/21/10  . Sleep apnea   . Urinary incontinence 2011  . Vitamin D deficiency    history of    Past Surgical History:  Procedure Laterality Date  . COLONOSCOPY  12/15/10  . HYSTEROSCOPY  2006 and May 2012   for fibroids, endometrial polyps  . MOUTH SURGERY    . MYOMECTOMY  1974    There were no vitals filed for this visit.   Subjective Assessment - 08/27/19 1057    Subjective Pt reports knee pain. Pt states she has been able to do some of the exercises and following the video.    Patient Stated Goals Decrease falls    Currently in  Pain? No/denies                             [facility] Adult PT Treatment/Exercise - 08/27/19 0001      Knee/Hip Exercises: Standing   Heel Raises Both;10 reps    Hip Abduction Stengthening;Both;10 reps    Hip Extension Stengthening;Both;10 reps    Forward Step Up Both;10 reps    Step Down --   attempted; however, difficulty due to knee pain   Other Standing Knee Exercises Single leg clock reach x 10 reps bilat    Other Standing Knee Exercises Mini squat with foot tap x 10 reps                    PT Short Term Goals - 08/12/19 1515      PT SHORT TERM GOAL #1   Title Pt will be independent with initial HEP    Time 4    Period Weeks    Status New    Target Date 09/09/19      PT SHORT TERM GOAL #2   Title Pt will improve bilat hip strength to at least 4/5    Baseline  3+/5 hip flexion and abduction, 4-/5 hip extension bilaterally    Time 4    Period Weeks    Status New    Target Date 09/09/19      PT SHORT TERM GOAL #3   Title Pt will have improved SLS to at least 8 sec bilaterally for improved step/stair negotiation    Baseline 4 sec on L, 3 sec on R    Time 4    Period Weeks    Status New    Target Date 09/09/19      PT SHORT TERM GOAL #4   Title Pt will improve stance time on foam with feet together to at least 20 sec with no LOB    Baseline 6 sec before LOB    Time 4    Period Weeks    Status New    Target Date 09/09/19             PT Long Term Goals - 08/12/19 2017      PT LONG TERM GOAL #1   Title Pt will be independent with HEP    Time 8    Period Weeks    Status New    Target Date 10/07/19      PT LONG TERM GOAL #2   Title Pt will improve bilat hip strength to 5/5 in all directions    Time 8    Period Weeks    Status New    Target Date 10/07/19      PT LONG TERM GOAL #3   Title Pt will be able to negotiate 5 steps without use of HR    Baseline Unable    Time 8    Period Weeks    Status New    Target Date 10/07/19       PT LONG TERM GOAL #4   Title Pt will improve SLS to 10 sec bilaterally    Time 8    Period Weeks    Status New    Target Date 10/07/19                 Plan - 08/27/19 1309    Clinical Impression Statement Pt has been very compliant with HEP. Treatment focused on improving single leg stability and stance; however, was limited due to Rt knee pain. Attempted exercises with step; however, pt with difficulty performing step down without R knee pain. Treatment focused on progressing strengthening and balance.    Personal Factors and Comorbidities Age;Fitness;Comorbidity 1    Comorbidities osteoarthritis    Examination-Activity Limitations Locomotion Level;Squat;Stairs    Examination-Participation Restrictions Community Activity;Yard Work    Stability/Clinical Decision Making Stable/Uncomplicated    Rehab Potential Good    PT Frequency 1x / week    PT Duration 8 weeks    PT Treatment/Interventions ADLs/Self Care Home Management;Aquatic Therapy;Cryotherapy;Electrical Stimulation;Iontophoresis 4mg /ml Dexamethasone;Moist Heat;Gait training;Stair training;Functional mobility training;Therapeutic activities;Therapeutic exercise;Balance training;Neuromuscular re-education;Patient/family education;Manual techniques;Dry needling;Taping    PT Next Visit Plan Assess response to HEP. Continue hip strengthening. Progress pt with exercises involving steps and single leg stance/narrow BOS.    PT Home Exercise Plan Access Code [email or web address]    Consulted and Agree with Plan of Care Patient           Patient will benefit from skilled therapeutic intervention in order to improve the following deficits and impairments:  Abnormal gait, Difficulty walking, Decreased balance, Improper body mechanics, Decreased mobility, Decreased strength, Increased edema, Postural dysfunction, Hypomobility, Decreased activity tolerance  Visit Diagnosis:  Repeated falls  History of falling  Muscle weakness  (generalized)  Stiffness of left knee, not elsewhere classified  Stiffness of right knee, not elsewhere classified     Problem List Patient Active Problem List   Diagnosis Date Noted  . Cyst of skin 10/02/2018  . Obstructive sleep apnea treated with continuous positive airway pressure (CPAP) 07/02/2017  . Osteopenia 09/01/2016  . Urinary incontinence in female 09/01/2016  . Vitamin D deficiency 09/01/2016  . BMI 35.0-35.9,adult 11/16/2015  . Essential hypertension, benign 04/28/2012  . Osteoarthritis of left knee 04/28/2012  . Hypothyroidism 04/28/2012  . Hx of colonic polyps 12/15/2010  . Anxiety 11/17/2010    Kelby Lotspeich April Ma L Miciah Covelli PT, DPT 08/27/2019, 1:12 PM  [facility] [address] [place], [place], [address] Phone: [phone]   Fax:  [phone]  Name: [name] MRN: [id number] Date of Birth: Jul 13, 1945

## 2019-08-28 ENCOUNTER — Ambulatory Visit (INDEPENDENT_AMBULATORY_CARE_PROVIDER_SITE_OTHER): Payer: Medicare PPO | Admitting: Psychology

## 2019-08-28 DIAGNOSIS — F319 Bipolar disorder, unspecified: Secondary | ICD-10-CM

## 2019-09-01 ENCOUNTER — Ambulatory Visit (INDEPENDENT_AMBULATORY_CARE_PROVIDER_SITE_OTHER): Payer: Medicare PPO | Admitting: Psychology

## 2019-09-01 DIAGNOSIS — F319 Bipolar disorder, unspecified: Secondary | ICD-10-CM | POA: Diagnosis not present

## 2019-09-03 ENCOUNTER — Ambulatory Visit: Payer: Medicare PPO | Admitting: Physical Therapy

## 2019-09-04 ENCOUNTER — Other Ambulatory Visit: Payer: Self-pay | Admitting: *Deleted

## 2019-09-04 ENCOUNTER — Ambulatory Visit (INDEPENDENT_AMBULATORY_CARE_PROVIDER_SITE_OTHER): Payer: Medicare PPO | Admitting: Family Medicine

## 2019-09-04 VITALS — BP 148/74 | Ht 61.5 in | Wt 182.0 lb

## 2019-09-04 DIAGNOSIS — Z0000 Encounter for general adult medical examination without abnormal findings: Secondary | ICD-10-CM | POA: Diagnosis not present

## 2019-09-04 DIAGNOSIS — R32 Unspecified urinary incontinence: Secondary | ICD-10-CM

## 2019-09-04 NOTE — Patient Instructions (Addendum)
Thank you for taking time to come for your Medicare Wellness Visit. I appreciate your ongoing commitment to your health goals. Please review the following plan we discussed and let me know if I can assist you in the future.  Joy Francisco LPN  Preventive Care 75 Years and Older, Female Preventive care refers to lifestyle choices and visits with your health care provider that can promote health and wellness. This includes:  A yearly physical exam. This is also called an annual well check.  Regular dental and eye exams.  Immunizations.  Screening for certain conditions.  Healthy lifestyle choices, such as diet and exercise. What can I expect for my preventive care visit? Physical exam Your health care provider will check:  Height and weight. These may be used to calculate body mass index (BMI), which is a measurement that tells if you are at a healthy weight.  Heart rate and blood pressure.  Your skin for abnormal spots. Counseling Your health care provider may ask you questions about:  Alcohol, tobacco, and drug use.  Emotional well-being.  Home and relationship well-being.  Sexual activity.  Eating habits.  History of falls.  Memory and ability to understand (cognition).  Work and work environment.  Pregnancy and menstrual history. What immunizations do I need?  Influenza (flu) vaccine  This is recommended every year. Tetanus, diphtheria, and pertussis (Tdap) vaccine  You may need a Td booster every 10 years. Varicella (chickenpox) vaccine  You may need this vaccine if you have not already been vaccinated. Zoster (shingles) vaccine  You may need this after age 60. Pneumococcal conjugate (PCV13) vaccine  One dose is recommended after age 75. Pneumococcal polysaccharide (PPSV23) vaccine  One dose is recommended after age 75. Measles, mumps, and rubella (MMR) vaccine  You may need at least one dose of MMR if you were born in 1957 or later. You may also  need a second dose. Meningococcal conjugate (MenACWY) vaccine  You may need this if you have certain conditions. Hepatitis A vaccine  You may need this if you have certain conditions or if you travel or work in places where you may be exposed to hepatitis A. Hepatitis B vaccine  You may need this if you have certain conditions or if you travel or work in places where you may be exposed to hepatitis B. Haemophilus influenzae type b (Hib) vaccine  You may need this if you have certain conditions. You may receive vaccines as individual doses or as more than one vaccine together in one shot (combination vaccines). Talk with your health care provider about the risks and benefits of combination vaccines. What tests do I need? Blood tests  Lipid and cholesterol levels. These may be checked every 5 years, or more frequently depending on your overall health.  Hepatitis C test.  Hepatitis B test. Screening  Lung cancer screening. You may have this screening every year starting at age 55 if you have a 30-pack-year history of smoking and currently smoke or have quit within the past 15 years.  Colorectal cancer screening. All adults should have this screening starting at age 50 and continuing until age 75. Your health care provider may recommend screening at age 45 if you are at increased risk. You will have tests every 1-10 years, depending on your results and the type of screening test.  Diabetes screening. This is done by checking your blood sugar (glucose) after you have not eaten for a while (fasting). You may have this done every 1-3   years.  Mammogram. This may be done every 1-2 years. Talk with your health care provider about how often you should have regular mammograms.  BRCA-related cancer screening. This may be done if you have a family history of breast, ovarian, tubal, or peritoneal cancers. Other tests  Sexually transmitted disease (STD) testing.  Bone density scan. This is done  to screen for osteoporosis. You may have this done starting at age 94. Follow these instructions at home: Eating and drinking  Eat a diet that includes fresh fruits and vegetables, whole grains, lean protein, and low-fat dairy products. Limit your intake of foods with high amounts of sugar, saturated fats, and salt.  Take vitamin and mineral supplements as recommended by your health care provider.  Do not drink alcohol if your health care provider tells you not to drink.  If you drink alcohol: ? Limit how much you have to 0-1 drink a day. ? Be aware of how much alcohol is in your drink. In the [place] one drink equals one 12 oz bottle of beer (355 mL), one 5 oz glass of wine (148 mL), or one 1 oz glass of hard liquor (44 mL). Lifestyle  Take daily care of your teeth and gums.  Stay active. Exercise for at least 30 minutes on 5 or more days each week.  Do not use any products that contain nicotine or tobacco, such as cigarettes, e-cigarettes, and chewing tobacco. If you need help quitting, ask your health care provider.  If you are sexually active, practice safe sex. Use a condom or other form of protection in order to prevent STIs (sexually transmitted infections).  Talk with your health care provider about taking a low-dose aspirin or statin. What's next?  Go to your health care provider once a year for a well check visit.  Ask your health care provider how often you should have your eyes and teeth checked.  Stay up to date on all vaccines. This information is not intended to replace advice given to you by your health care provider. Make sure you discuss any questions you have with your health care provider. Document Revised: 02/14/2018 Document Reviewed: 02/14/2018 Elsevier Patient Education  2020 [employer].

## 2019-09-04 NOTE — Progress Notes (Signed)
Presents today for [employer] Visit   Date of last exam: 06-23-2019  Interpreter used for this visit? No  I connected with  [name] on 09/04/19 by a telephone  and verified that I am speaking with the correct person using two identifiers.   I discussed the limitations of evaluation and management by telemedicine. The patient expressed understanding and agreed to proceed.    Patient Care Team: [name], MD as PCP - General (Family Medicine) [name], [name], MD (Inactive) as Consulting Physician (Obstetrics and Gynecology) [name], MD as Attending Physician (Neurology) [name], MD as Consulting Physician (Internal Medicine)   Other items to address today:   Discussed Eye/Dental Discussed Immunizations 3 month follow up 7-19 @ 2:40   Other Screening: Last screening for diabetes: 4-192021   ADVANCE DIRECTIVES: Discussed:yes On File: no Materials Provided: yes  Immunization status:  Immunization History  Administered Date(s) Administered  . DTaP 03/05/2008  . Fluad Quad(high Dose 65+) 12/23/2018  . PFIZER SARS-COV-2 Vaccination 04/12/2019, 05/07/2019  . Pneumococcal Conjugate-13 11/13/2015  . Pneumococcal-Unspecified 06/27/2010  . Tdap 02/12/2015     Health Maintenance Due  Topic Date Due  . COLONOSCOPY  07/27/2017     Functional Status Survey: Is the patient deaf or have difficulty hearing?: No Does the patient have difficulty seeing, even when wearing glasses/contacts?: No Does the patient have difficulty concentrating, remembering, or making decisions?: No Does the patient have difficulty walking or climbing stairs?: Yes (mostly going down) Does the patient have difficulty dressing or bathing?: No Does the patient have difficulty doing errands alone such as visiting a doctor's office or shopping?: No   6CIT Screen 09/04/2019 08/26/2018  What Year? 0 points 0 points  What month? 0 points 0 points   What time? 0 points 0 points  Count back from 20 0 points 0 points  Months in reverse 0 points 0 points  Repeat phrase 0 points 0 points  Total Score 0 0        Clinical Support from 09/04/2019 in [place] [facility] [place]  AUDIT-C Score 2       Home Environment:   Lives in one story home Yes  Trouble going down stairs Yes scattered rugs grabbers Yes grab bars Adequate lighting/ no clutter   Patient Active Problem List   Diagnosis Date Noted  . Cyst of skin 10/02/2018  . Obstructive sleep apnea treated with continuous positive airway pressure (CPAP) 07/02/2017  . Osteopenia 09/01/2016  . Urinary incontinence in female 09/01/2016  . Vitamin D deficiency 09/01/2016  . BMI 35.0-35.9,adult 11/16/2015  . Essential hypertension, benign 04/28/2012  . Osteoarthritis of left knee 04/28/2012  . Hypothyroidism 04/28/2012  . Hx of colonic polyps 12/15/2010  . Anxiety 11/17/2010     Past Medical History:  Diagnosis Date  . Abnormal perimenopausal bleeding 2005  . Allergy   . Anemia   . Anxiety   . Dysuria 2004  . Endometrial polyp 2006  . Environmental allergies   . Fibroid 02/19/03  . H/O varicella   . History of measles, mumps, or rubella   . Hx of colonic polyps 12/15/2010  . Hypertension 07-19-2010   echo for pre-op EF 55%  normal left wall thickness LA mildly dialated with mitral and tricuspid regurgatation  . Hypothyroidism   . Interstitial cystitis   . Menopausal symptoms 2004  . Obesity   . Osteopenia 02/2012   -1.8 T score right femur neck  .  PMB (postmenopausal bleeding) 06/21/10  . Sleep apnea   . Urinary incontinence 2011  . Vitamin D deficiency    history of     Past Surgical History:  Procedure Laterality Date  . COLONOSCOPY  12/15/10  . HYSTEROSCOPY  2006 and May 2012   for fibroids, endometrial polyps  . MOUTH SURGERY    . MYOMECTOMY  1974     Family History  Problem Relation Age of Onset  . Kidney disease Father        from uremia  .  Benign prostatic hyperplasia Father   . Hypertension Mother   . Colon cancer Paternal Uncle   . Clotting disorder Maternal Grandmother        ????     Social History   Socioeconomic History  . Marital status: Divorced    Spouse name: n/a  . Number of children: 0  . Years of education: college  . Highest education level: Not on file  Occupational History  . Not on file  Tobacco Use  . Smoking status: Never Smoker  . Smokeless tobacco: Never Used  Substance and Sexual Activity  . Alcohol use: Yes    Alcohol/week: 1.0 standard drink    Types: 1 Glasses of wine per week    Comment: occasional (1-2 times per week)  . Drug use: No  . Sexual activity: Not Currently    Birth control/protection: Abstinence  Other Topics Concern  . Not on file  Social History Narrative   Lives alone.   Sister stays with her frequently.   Participates in [employer] exercise program.   Social Determinants of Health   Financial Resource Strain:   . Difficulty of Paying Living Expenses:   Food Insecurity:   . Worried About Charity fundraiser in the Last Year:   . Arboriculturist in the Last Year:   Transportation Needs:   . Film/video editor (Medical):   [name] Lack of Transportation (Non-Medical):   Physical Activity:   . Days of Exercise per Week:   . Minutes of Exercise per Session:   Stress:   . Feeling of Stress :   Social Connections:   . Frequency of Communication with Friends and Family:   . Frequency of Social Gatherings with Friends and Family:   . Attends Religious Services:   . Active Member of Clubs or Organizations:   . Attends Archivist Meetings:   [name] Marital Status:   Intimate Partner Violence:   . Fear of Current or Ex-Partner:   . Emotionally Abused:   [name] Physically Abused:   . Sexually Abused:      Allergies  Allergen Reactions  . Bactrim [Sulfamethoxazole-Trimethoprim]   . Epinephrine     shakey  . Erythromycin   . Sulfa Antibiotics Rash      Prior to Admission medications   Medication Sig Start Date End Date Taking? Authorizing Provider  Ascorbic Acid (VITAMIN C) 1000 MG tablet Take 1,000 mg by mouth daily. One tablet a day   Yes [provider]  benazepril-hydrochlorthiazide (LOTENSIN HCT) 20-12.5 MG tablet Take 2 tablets by mouth daily. 06/23/19  Yes [name], MD  cholecalciferol (VITAMIN D3) 25 MCG (1000 UNIT) tablet Take 2,000 Units by mouth daily.   Yes [provider]  hydrALAZINE (APRESOLINE) 25 MG tablet TAKE 2 TABLETS BY MOUTH EVERY MORNING, THEN 1 TABLET AT LUNCH AND 2 TABLETS AT [facility] 06/23/19  Yes [name], MD  levothyroxine (SYNTHROID) 112  MCG tablet TAKE 1 TABLET BY MOUTH EVERY DAY BEFORE BREAKFAST 12/20/18  Yes [name], MD  liothyronine (CYTOMEL) 5 MCG tablet TAKE 1 TABLET BY MOUTH EVERY DAY FOR THYROID 12/20/18  Yes [name], MD  metoprolol succinate (TOPROL-XL) 25 MG 24 hr tablet TAKE 1 TABLET(25 MG) BY MOUTH DAILY 07/08/19  Yes [name], MD     Depression screen [facility] 2/9 09/04/2019 06/23/2019 10/02/2018 08/26/2018 06/26/2018  Decreased Interest 0 0 0 0 0  Down, Depressed, Hopeless 0 0 0 0 0  PHQ - 2 Score 0 0 0 0 0  Altered sleeping - - - - -  Tired, decreased energy - - - - -  Change in appetite - - - - -  Feeling bad or failure about yourself  - - - - -  Trouble concentrating - - - - -  Moving slowly or fidgety/restless - - - - -  Suicidal thoughts - - - - -  PHQ-9 Score - - - - -     Fall Risk  09/04/2019 06/23/2019 10/02/2018 08/26/2018 06/26/2018  Falls in the past year? 0 1 0 0 0  Number falls in past yr: 1 1 0 0 -  Comment [place] carpet[place] - - - -  Injury with Fall? 0 0 0 0 -  Risk for fall due to : History of fall(s) - - - -  Follow up Falls evaluation completed;Education provided Falls evaluation completed Falls evaluation completed Falls evaluation completed;Education provided;Falls  prevention discussed -      PHYSICAL EXAM: BP (!) 148/74 Comment: per patient  Ht 5' 1.5" (1.562 m)   Wt 182 lb (82.6 kg)   BMI 33.83 kg/m    Wt Readings from Last 3 Encounters:  09/04/19 182 lb (82.6 kg)  06/23/19 182 lb (82.6 kg)  10/02/18 176 lb 12.8 oz (80.2 kg)      Education/Counseling provided regarding diet and exercise, prevention of chronic diseases, smoking/tobacco cessation, if applicable, and reviewed "Covered Medicare Preventive Services."

## 2019-09-10 ENCOUNTER — Other Ambulatory Visit: Payer: Self-pay

## 2019-09-10 ENCOUNTER — Ambulatory Visit: Payer: Medicare PPO | Attending: Family Medicine | Admitting: Physical Therapy

## 2019-09-10 DIAGNOSIS — M25661 Stiffness of right knee, not elsewhere classified: Secondary | ICD-10-CM | POA: Insufficient documentation

## 2019-09-10 DIAGNOSIS — R296 Repeated falls: Secondary | ICD-10-CM

## 2019-09-10 DIAGNOSIS — M6281 Muscle weakness (generalized): Secondary | ICD-10-CM | POA: Diagnosis not present

## 2019-09-10 DIAGNOSIS — M25662 Stiffness of left knee, not elsewhere classified: Secondary | ICD-10-CM | POA: Insufficient documentation

## 2019-09-10 DIAGNOSIS — Z9181 History of falling: Secondary | ICD-10-CM | POA: Diagnosis not present

## 2019-09-10 NOTE — Therapy (Signed)
[place], [place], [address] Phone: [phone]   Fax:  [phone]  Physical Therapy Treatment  Patient Details  Name: FLAVIA BRUSS MRN: [id number] Date of Birth: 08/03/1945 Referring Provider (PT): [name], MD   Encounter Date: 09/10/2019   PT End of Session - 09/10/19 1056    Visit Number 3    Number of Visits [place] pending    Authorization - Visit Number 3    Authorization - Number of Visits 16    PT Start Time 3235    PT Stop Time 1137    PT Time Calculation (min) 49 min    Activity Tolerance Patient tolerated treatment well    Behavior During Therapy [facility] for tasks assessed/performed           Past Medical History:  Diagnosis Date  . Abnormal perimenopausal bleeding 2005  . Allergy   . Anemia   . Anxiety   . Dysuria 2004  . Endometrial polyp 2006  . Environmental allergies   . Fibroid 02/19/03  . H/O varicella   . History of measles, mumps, or rubella   . Hx of colonic polyps 12/15/2010  . Hypertension 07-19-2010   echo for pre-op EF 55%  normal left wall thickness LA mildly dialated with mitral and tricuspid regurgatation  . Hypothyroidism   . Interstitial cystitis   . Menopausal symptoms 2004  . Obesity   . Osteopenia 02/2012   -1.8 T score right femur neck  . PMB (postmenopausal bleeding) 06/21/10  . Sleep apnea   . Urinary incontinence 2011  . Vitamin D deficiency    history of    Past Surgical History:  Procedure Laterality Date  . COLONOSCOPY  12/15/10  . HYSTEROSCOPY  2006 and May 2012   for fibroids, endometrial polyps  . MOUTH SURGERY    . MYOMECTOMY  1974    There were no vitals filed for this visit.   Subjective Assessment - 09/10/19 1055    Subjective Pt states that she was unable to do the mini squats due to hurting her knee too much.    Patient Stated Goals Decrease falls    Currently in Pain? No/denies                               [facility] Adult PT Treatment/Exercise - 09/10/19 0001      Knee/Hip Exercises: Stretches   Sports administrator Right;30 seconds    Quad Stretch Limitations foot on stool    Gastroc Stretch Both;30 seconds    Gastroc Stretch Limitations On slant board, standing, and then in sitting    Soleus Stretch Both;30 seconds    Soleus Stretch Limitations On slant board, in standing and in sitting      Knee/Hip Exercises: Aerobic   Nustep L2 x 5 min      Knee/Hip Exercises: Standing   SLS 3 sets x 10 bilat   13 sec on L, 5 sec on R   Other Standing Knee Exercises Step over obstacle sideways and the forward x 10 reps each      Knee/Hip Exercises: Seated   Long Arc Quad Strengthening;Both;10 reps    Long Arc Quad Limitations red tband      Modalities   Modalities Vasopneumatic      Vasopneumatic   Number Minutes Vasopneumatic  10 minutes    Vasopnuematic  Location  Knee    Vasopneumatic Pressure Low    Vasopneumatic Temperature  34      Manual Therapy   Manual Therapy Soft tissue mobilization;Myofascial release    Soft tissue mobilization Quad (especially lateral), ITB, hamstring, gastroc, anterior tib, soleus                  PT Education - 09/10/19 1313    Education Details Discussed reducing frequency of exercises, increased icing, and using roller for muscle knots    Person(s) Educated Patient    Methods Explanation;Demonstration;Handout    Comprehension Verbalized understanding;Returned demonstration;Verbal cues required;Tactile cues required            PT Short Term Goals - 09/10/19 1308      PT SHORT TERM GOAL #1   Title Pt will be independent with initial HEP    Time 4    Period Weeks    Status Achieved    Target Date 09/09/19      PT SHORT TERM GOAL #2   Title Pt will improve bilat hip strength to at least 4/5    Baseline 3+/5 hip flexion and abduction, 4-/5 hip extension bilaterally    Time 4    Period Weeks    Status  On-going    Target Date 09/09/19      PT SHORT TERM GOAL #3   Title Pt will have improved SLS to at least 8 sec bilaterally for improved step/stair negotiation    Baseline 4 sec on L, 3 sec on R    Time 4    Period Weeks    Status Achieved    Target Date 09/09/19      PT SHORT TERM GOAL #4   Title Pt will improve stance time on foam with feet together to at least 20 sec with no LOB    Baseline 6 sec before LOB    Time 4    Period Weeks    Status On-going    Target Date 09/09/19             PT Long Term Goals - 08/12/19 2017      PT LONG TERM GOAL #1   Title Pt will be independent with HEP    Time 8    Period Weeks    Status New    Target Date 10/07/19      PT LONG TERM GOAL #2   Title Pt will improve bilat hip strength to 5/5 in all directions    Time 8    Period Weeks    Status New    Target Date 10/07/19      PT LONG TERM GOAL #3   Title Pt will be able to negotiate 5 steps without use of HR    Baseline Unable    Time 8    Period Weeks    Status New    Target Date 10/07/19      PT LONG TERM GOAL #4   Title Pt will improve SLS to 10 sec bilaterally    Time 8    Period Weeks    Status New    Target Date 10/07/19                 Plan - 09/10/19 1305    Clinical Impression Statement Pt presents to clinic with increased R knee edema and muscle tightness with trigger points along lateral quad, ITB, lateral gastroc, and anterior tibialis. Advised pt to decrease frequency of exercises  to allow more rest days. Pt with improving single leg stance and overall balance (pt able to tolerate >10 sec). Treatment focused on gentle stretching, manual therapy, and continued neuro re-ed for balance to pt's tolerance.    Personal Factors and Comorbidities Age;Fitness;Comorbidity 1    Comorbidities osteoarthritis    Examination-Activity Limitations Locomotion Level;Squat;Stairs    Examination-Participation Restrictions Community Activity;Yard Work     Stability/Clinical Decision Making Stable/Uncomplicated    Rehab Potential Good    PT Frequency 1x / week    PT Duration 8 weeks    PT Treatment/Interventions ADLs/Self Care Home Management;Aquatic Therapy;Cryotherapy;Electrical Stimulation;Iontophoresis 4mg /ml Dexamethasone;Moist Heat;Gait training;Stair training;Functional mobility training;Therapeutic activities;Therapeutic exercise;Balance training;Neuromuscular re-education;Patient/family education;Manual techniques;Dry needling;Taping    PT Next Visit Plan Assess response to HEP. Continue hip strengthening. Progress pt with exercises involving steps, closed chain exercises, and single leg stance/narrow BOS. Focus on quad strengthening and control as able.    PT Home Exercise Plan Access Code [email or web address]    Consulted and Agree with Plan of Care Patient           Patient will benefit from skilled therapeutic intervention in order to improve the following deficits and impairments:  Abnormal gait, Difficulty walking, Decreased balance, Improper body mechanics, Decreased mobility, Decreased strength, Increased edema, Postural dysfunction, Hypomobility, Decreased activity tolerance  Visit Diagnosis: Repeated falls  History of falling  Muscle weakness (generalized)  Stiffness of left knee, not elsewhere classified  Stiffness of right knee, not elsewhere classified     Problem List Patient Active Problem List   Diagnosis Date Noted  . Cyst of skin 10/02/2018  . Obstructive sleep apnea treated with continuous positive airway pressure (CPAP) 07/02/2017  . Osteopenia 09/01/2016  . Urinary incontinence in female 09/01/2016  . Vitamin D deficiency 09/01/2016  . BMI 35.0-35.9,adult 11/16/2015  . Essential hypertension, benign 04/28/2012  . Osteoarthritis of left knee 04/28/2012  . Hypothyroidism 04/28/2012  . Hx of colonic polyps 12/15/2010  . Anxiety 11/17/2010    Leodan Bolyard April Ma L Scarlette Hogston PT, DPT 09/10/2019, 1:13 PM  [facility] [address] [place], [place], [address] Phone: [phone]   Fax:  [phone]  Name: KAMPBELL HOLAWAY MRN: [id number] Date of Birth: 1945/05/02

## 2019-09-16 ENCOUNTER — Other Ambulatory Visit: Payer: Self-pay | Admitting: Family Medicine

## 2019-09-16 DIAGNOSIS — I10 Essential (primary) hypertension: Secondary | ICD-10-CM

## 2019-09-17 ENCOUNTER — Ambulatory Visit: Payer: Medicare PPO | Admitting: Physical Therapy

## 2019-09-17 ENCOUNTER — Other Ambulatory Visit: Payer: Self-pay

## 2019-09-17 ENCOUNTER — Ambulatory Visit (INDEPENDENT_AMBULATORY_CARE_PROVIDER_SITE_OTHER): Payer: Medicare PPO | Admitting: Psychology

## 2019-09-17 DIAGNOSIS — R296 Repeated falls: Secondary | ICD-10-CM

## 2019-09-17 DIAGNOSIS — M25661 Stiffness of right knee, not elsewhere classified: Secondary | ICD-10-CM | POA: Diagnosis not present

## 2019-09-17 DIAGNOSIS — M6281 Muscle weakness (generalized): Secondary | ICD-10-CM | POA: Diagnosis not present

## 2019-09-17 DIAGNOSIS — Z9181 History of falling: Secondary | ICD-10-CM

## 2019-09-17 DIAGNOSIS — M25662 Stiffness of left knee, not elsewhere classified: Secondary | ICD-10-CM | POA: Diagnosis not present

## 2019-09-17 DIAGNOSIS — F319 Bipolar disorder, unspecified: Secondary | ICD-10-CM

## 2019-09-17 NOTE — Therapy (Signed)
[place], [place], [address] Phone: [phone]   Fax:  [phone]  Physical Therapy Treatment  Patient Details  Name: [name] MRN: [id number] Date of Birth: 05/07/1945 Referring Provider (PT): [name], MD   Encounter Date: 09/17/2019   PT End of Session - 09/17/19 1133    Visit Number 4    Number of Visits [place] pending    Authorization - Visit Number 4    Authorization - Number of Visits 16    PT Start Time 3154    PT Stop Time 0086    PT Time Calculation (min) 52 min    Activity Tolerance Patient tolerated treatment well    Behavior During Therapy [facility] for tasks assessed/performed           Past Medical History:  Diagnosis Date   Abnormal perimenopausal bleeding 2005   Allergy    Anemia    Anxiety    Dysuria 2004   Endometrial polyp 2006   Environmental allergies    Fibroid 02/19/03   H/O varicella    History of measles, mumps, or rubella    Hx of colonic polyps 12/15/2010   Hypertension 07-19-2010   echo for pre-op EF 55%  normal left wall thickness LA mildly dialated with mitral and tricuspid regurgatation   Hypothyroidism    Interstitial cystitis    Menopausal symptoms 2004   Obesity    Osteopenia 02/2012   -1.8 T score right femur neck   PMB (postmenopausal bleeding) 06/21/10   Sleep apnea    Urinary incontinence 2011   Vitamin D deficiency    history of    Past Surgical History:  Procedure Laterality Date   COLONOSCOPY  12/15/10   HYSTEROSCOPY  2006 and May 2012   for fibroids, endometrial polyps   [place]    There were no vitals filed for this visit.   Subjective Assessment - 09/17/19 1242    Subjective Pt reports increased bilat knee pain with exercises. Pt states she scaled exercises down but still feels that her knees are hindering her the most in terms of  balance.    Patient Stated Goals Decrease falls                             [facility] Adult PT Treatment/Exercise - 09/17/19 0001      Knee/Hip Exercises: Stretches   Quad Stretch Right;30 seconds;Left    Quad Stretch Limitations foot on stool      Knee/Hip Exercises: Standing   Lateral Step Up Both;5 reps    Forward Step Up Both;10 reps    Step Down Both;5 reps    Other Standing Knee Exercises tandem stance each leg x 30 sec, with head turns x 30 sec      Modalities   Modalities Vasopneumatic      Vasopneumatic   Number Minutes Vasopneumatic  10 minutes    Vasopnuematic Location  Knee    Vasopneumatic Pressure Low    Vasopneumatic Temperature  38      Manual Therapy   Manual Therapy Soft tissue mobilization;Myofascial release    Soft tissue mobilization IASTM Quad (especially lateral), ITB, hamstring, gastroc, anterior tib, soleus                    PT Short Term Goals -  09/10/19 1308      PT SHORT TERM GOAL #1   Title Pt will be independent with initial HEP    Time 4    Period Weeks    Status Achieved    Target Date 09/09/19      PT SHORT TERM GOAL #2   Title Pt will improve bilat hip strength to at least 4/5    Baseline 3+/5 hip flexion and abduction, 4-/5 hip extension bilaterally    Time 4    Period Weeks    Status On-going    Target Date 09/09/19      PT SHORT TERM GOAL #3   Title Pt will have improved SLS to at least 8 sec bilaterally for improved step/stair negotiation    Baseline 4 sec on L, 3 sec on R    Time 4    Period Weeks    Status Achieved    Target Date 09/09/19      PT SHORT TERM GOAL #4   Title Pt will improve stance time on foam with feet together to at least 20 sec with no LOB    Baseline 6 sec before LOB    Time 4    Period Weeks    Status On-going    Target Date 09/09/19             PT Long Term Goals - 08/12/19 2017      PT LONG TERM GOAL #1   Title Pt will be independent with HEP    Time 8     Period Weeks    Status New    Target Date 10/07/19      PT LONG TERM GOAL #2   Title Pt will improve bilat hip strength to 5/5 in all directions    Time 8    Period Weeks    Status New    Target Date 10/07/19      PT LONG TERM GOAL #3   Title Pt will be able to negotiate 5 steps without use of HR    Baseline Unable    Time 8    Period Weeks    Status New    Target Date 10/07/19      PT LONG TERM GOAL #4   Title Pt will improve SLS to 10 sec bilaterally    Time 8    Period Weeks    Status New    Target Date 10/07/19                 Plan - 09/17/19 1243    Clinical Impression Statement Modified HEP to decrease amount of strain on bilat knees. Decreased time spent in SLS and changed to tandem stance. Treatment focused on stretching, manual therapy, step training and neuro re-ed to pt's tolerance.    Personal Factors and Comorbidities Age;Fitness;Comorbidity 1    Comorbidities osteoarthritis    Examination-Activity Limitations Locomotion Level;Squat;Stairs    Examination-Participation Restrictions Community Activity;Yard Work    Stability/Clinical Decision Making Stable/Uncomplicated    Rehab Potential Good    PT Frequency 1x / week    PT Duration 8 weeks    PT Treatment/Interventions ADLs/Self Care Home Management;Aquatic Therapy;Cryotherapy;Electrical Stimulation;Iontophoresis 4mg /ml Dexamethasone;Moist Heat;Gait training;Stair training;Functional mobility training;Therapeutic activities;Therapeutic exercise;Balance training;Neuromuscular re-education;Patient/family education;Manual techniques;Dry needling;Taping    PT Next Visit Plan Assess response to HEP. Continue hip strengthening. Progress pt with exercises involving steps, closed chain exercises, and narrow BOS. Focus on quad strengthening and control as able (may need to scale down  to SAQ).    PT Home Exercise Plan Access Code [email or web address]    Consulted and Agree with Plan of Care Patient           Patient will  benefit from skilled therapeutic intervention in order to improve the following deficits and impairments:  Abnormal gait, Difficulty walking, Decreased balance, Improper body mechanics, Decreased mobility, Decreased strength, Increased edema, Postural dysfunction, Hypomobility, Decreased activity tolerance  Visit Diagnosis: Repeated falls  History of falling  Muscle weakness (generalized)  Stiffness of left knee, not elsewhere classified  Stiffness of right knee, not elsewhere classified     Problem List Patient Active Problem List   Diagnosis Date Noted   Cyst of skin 10/02/2018   Obstructive sleep apnea treated with continuous positive airway pressure (CPAP) 07/02/2017   Osteopenia 09/01/2016   Urinary incontinence in female 09/01/2016   Vitamin D deficiency 09/01/2016   BMI 35.0-35.9,adult 11/16/2015   Essential hypertension, benign 04/28/2012   Osteoarthritis of left knee 04/28/2012   Hypothyroidism 04/28/2012   Hx of colonic polyps 12/15/2010   Anxiety 11/17/2010    Trevonne Nyland April Beatrix Shipper Salah Nakamura PT, DPT 09/17/2019, 12:47 PM  [facility] [address] [place], [place], [address] Phone: [phone]   Fax:  [phone]  Name: SHEFALI NG MRN: [id number] Date of Birth: 26-Mar-1945

## 2019-09-22 ENCOUNTER — Ambulatory Visit: Payer: Medicare PPO | Admitting: Family Medicine

## 2019-09-24 ENCOUNTER — Ambulatory Visit: Payer: Medicare PPO | Admitting: Physical Therapy

## 2019-09-24 ENCOUNTER — Other Ambulatory Visit: Payer: Self-pay

## 2019-09-24 DIAGNOSIS — M25662 Stiffness of left knee, not elsewhere classified: Secondary | ICD-10-CM

## 2019-09-24 DIAGNOSIS — R296 Repeated falls: Secondary | ICD-10-CM

## 2019-09-24 DIAGNOSIS — M25661 Stiffness of right knee, not elsewhere classified: Secondary | ICD-10-CM | POA: Diagnosis not present

## 2019-09-24 DIAGNOSIS — M6281 Muscle weakness (generalized): Secondary | ICD-10-CM

## 2019-09-24 DIAGNOSIS — Z9181 History of falling: Secondary | ICD-10-CM

## 2019-09-24 NOTE — Therapy (Signed)
[place], [place], [address] Phone: [phone]   Fax:  [phone]  Physical Therapy Treatment  Patient Details  Name: [name] MRN: [id number] Date of Birth: [birth year] Referring Provider (PT): [name], MD   Encounter Date: 09/24/2019   PT End of Session - 09/24/19 1046    Visit Number 5    Number of Visits [place] pending    Authorization - Visit Number 5    Authorization - Number of Visits 16    PT Start Time 1050    PT Stop Time 1150    PT Time Calculation (min) 60 min    Activity Tolerance Patient tolerated treatment well    Behavior During Therapy [facility] for tasks assessed/performed           Past Medical History:  Diagnosis Date  . Abnormal perimenopausal bleeding 2005  . Allergy   . Anemia   . Anxiety   . Dysuria 2004  . Endometrial polyp 2006  . Environmental allergies   . Fibroid 02/19/03  . H/O varicella   . History of measles, mumps, or rubella   . Hx of colonic polyps 12/15/2010  . Hypertension 07-19-2010   echo for pre-op EF 55%  normal left wall thickness LA mildly dialated with mitral and tricuspid regurgatation  . Hypothyroidism   . Interstitial cystitis   . Menopausal symptoms 2004  . Obesity   . Osteopenia 02/2012   -1.8 T score right femur neck  . PMB (postmenopausal bleeding) 06/21/10  . Sleep apnea   . Urinary incontinence 2011  . Vitamin D deficiency    history of    Past Surgical History:  Procedure Laterality Date  . COLONOSCOPY  12/15/10  . HYSTEROSCOPY  2006 and May 2012   for fibroids, endometrial polyps  . MOUTH SURGERY    . MYOMECTOMY  1974    There were no vitals filed for this visit.   Subjective Assessment - 09/24/19 1052    Subjective Pt states she's been using ice and the muscle massager stick. She notes that things have been going better.                              [facility] Adult PT Treatment/Exercise - 09/24/19 0001      Knee/Hip Exercises: Stretches   Gastroc Stretch Both;30 seconds    Soleus Stretch Both;30 seconds    Other Knee/Hip Stretches Anterior tibialis stretch x 30 sec bilat, ankle inversion stretch x 30 sec bilat      Knee/Hip Exercises: Aerobic   Nustep L5 x 5 min      Knee/Hip Exercises: Standing   SLS 10 sec bilat    Other Standing Knee Exercises tandem stance each leg x 30 sec    Other Standing Knee Exercises Half step up x 10 bilat      Knee/Hip Exercises: Supine   Quad Sets Strengthening;10 reps;Both    Heel Slides AAROM;Both;10 reps    Straight Leg Raises Strengthening;Both;10 reps      Knee/Hip Exercises: Sidelying   Hip ABduction Strengthening;10 reps;Both      Modalities   Modalities Vasopneumatic      Vasopneumatic   Number Minutes Vasopneumatic  10 minutes    Vasopnuematic Location  Knee    Vasopneumatic Pressure Low    Vasopneumatic Temperature  38      Manual  Therapy   Manual Therapy Soft tissue mobilization;Myofascial release    Soft tissue mobilization R anterior tibialis, L proximal gastroc with TPR                    PT Short Term Goals - 09/10/19 1308      PT SHORT TERM GOAL #1   Title Pt will be independent with initial HEP    Time 4    Period Weeks    Status Achieved    Target Date 09/09/19      PT SHORT TERM GOAL #2   Title Pt will improve bilat hip strength to at least 4/5    Baseline 3+/5 hip flexion and abduction, 4-/5 hip extension bilaterally    Time 4    Period Weeks    Status On-going    Target Date 09/09/19      PT SHORT TERM GOAL #3   Title Pt will have improved SLS to at least 8 sec bilaterally for improved step/stair negotiation    Baseline 4 sec on L, 3 sec on R    Time 4    Period Weeks    Status Achieved    Target Date 09/09/19      PT SHORT TERM GOAL #4   Title Pt will improve stance time on foam with feet together to at least 20 sec with no LOB    Baseline 6  sec before LOB    Time 4    Period Weeks    Status On-going    Target Date 09/09/19             PT Long Term Goals - 08/12/19 2017      PT LONG TERM GOAL #1   Title Pt will be independent with HEP    Time 8    Period Weeks    Status New    Target Date 10/07/19      PT LONG TERM GOAL #2   Title Pt will improve bilat hip strength to 5/5 in all directions    Time 8    Period Weeks    Status New    Target Date 10/07/19      PT LONG TERM GOAL #3   Title Pt will be able to negotiate 5 steps without use of HR    Baseline Unable    Time 8    Period Weeks    Status New    Target Date 10/07/19      PT LONG TERM GOAL #4   Title Pt will improve SLS to 10 sec bilaterally    Time 8    Period Weeks    Status New    Target Date 10/07/19                 Plan - 09/24/19 1145    Clinical Impression Statement Decreased quad tightness and spasm this session. Pt with increased ankle and calf tightness likely due to her balance exercises. PT addressed with manual therapy and stretching. PT continuing to progress step training for pain free motion. Pt requested vaso after session.    Personal Factors and Comorbidities Age;Fitness;Comorbidity 1    Comorbidities osteoarthritis    Examination-Activity Limitations Locomotion Level;Squat;Stairs    Examination-Participation Restrictions Community Activity;Yard Work    Stability/Clinical Decision Making Stable/Uncomplicated    Rehab Potential Good    PT Frequency 1x / week    PT Duration 8 weeks    PT Treatment/Interventions ADLs/Self Care Home Management;Aquatic  Therapy;Cryotherapy;Electrical Stimulation;Iontophoresis 4mg /ml Dexamethasone;Moist Heat;Gait training;Stair training;Functional mobility training;Therapeutic activities;Therapeutic exercise;Balance training;Neuromuscular re-education;Patient/family education;Manual techniques;Dry needling;Taping    PT Next Visit Plan Assess response to HEP. Continue hip strengthening.  Progress pt with exercises involving steps, closed chain exercises, and narrow BOS. Focus on quad strengthening and control as able (may need to scale down to SAQ). Assess response to stretching for ankles.    PT Home Exercise Plan Access Code [email or web address]    Consulted and Agree with Plan of Care Patient           Patient will benefit from skilled therapeutic intervention in order to improve the following deficits and impairments:  Abnormal gait, Difficulty walking, Decreased balance, Improper body mechanics, Decreased mobility, Decreased strength, Increased edema, Postural dysfunction, Hypomobility, Decreased activity tolerance  Visit Diagnosis: Repeated falls  History of falling  Muscle weakness (generalized)  Stiffness of left knee, not elsewhere classified  Stiffness of right knee, not elsewhere classified     Problem List Patient Active Problem List   Diagnosis Date Noted  . Cyst of skin 10/02/2018  . Obstructive sleep apnea treated with continuous positive airway pressure (CPAP) 07/02/2017  . Osteopenia 09/01/2016  . Urinary incontinence in female 09/01/2016  . Vitamin D deficiency 09/01/2016  . BMI 35.0-35.9,adult 11/16/2015  . Essential hypertension, benign 04/28/2012  . Osteoarthritis of left knee 04/28/2012  . Hypothyroidism 04/28/2012  . Hx of colonic polyps 12/15/2010  . Anxiety 11/17/2010    Annamary Buschman April Ma L Janith Nielson PT, DPT 09/24/2019, 11:53 AM  [facility] [address] [place], [place], [address] Phone: [phone]   Fax:  [phone]  Name: TIARRAH SAVILLE MRN: [id number] Date of Birth: [birth year]

## 2019-09-30 ENCOUNTER — Ambulatory Visit (INDEPENDENT_AMBULATORY_CARE_PROVIDER_SITE_OTHER): Payer: Medicare PPO | Admitting: Psychology

## 2019-09-30 DIAGNOSIS — F319 Bipolar disorder, unspecified: Secondary | ICD-10-CM

## 2019-10-01 ENCOUNTER — Ambulatory Visit: Payer: Medicare PPO | Admitting: Physical Therapy

## 2019-10-02 ENCOUNTER — Encounter: Payer: Medicare PPO | Admitting: Physical Therapy

## 2019-10-07 ENCOUNTER — Other Ambulatory Visit: Payer: Self-pay

## 2019-10-07 ENCOUNTER — Encounter: Payer: Self-pay | Admitting: Family Medicine

## 2019-10-07 ENCOUNTER — Ambulatory Visit: Payer: Medicare PPO | Admitting: Family Medicine

## 2019-10-07 VITALS — BP 134/82 | HR 61 | Temp 98.0°F | Ht 61.5 in | Wt 184.0 lb

## 2019-10-07 DIAGNOSIS — E039 Hypothyroidism, unspecified: Secondary | ICD-10-CM | POA: Diagnosis not present

## 2019-10-07 DIAGNOSIS — I10 Essential (primary) hypertension: Secondary | ICD-10-CM

## 2019-10-07 DIAGNOSIS — F419 Anxiety disorder, unspecified: Secondary | ICD-10-CM | POA: Diagnosis not present

## 2019-10-07 DIAGNOSIS — M1712 Unilateral primary osteoarthritis, left knee: Secondary | ICD-10-CM | POA: Diagnosis not present

## 2019-10-07 NOTE — Progress Notes (Signed)
8/3/202110:29 AM  [name] May 12, 1945, 74 y.o., female [id number]  Chief Complaint  Patient presents with  . Hypertension    wants print out on meal plan to cook healthier meals  . Hypothyroidism    HPI:   Patient is a 74 y.o. female with past medical history significant for HTN, hypothyroidism, anxiety who presents today for routine followup  Last OV April 2021 - referred to PT for balance/falls, 2/2 OA knee, patient not interested in seeing ortho Completed PT, last visit July 21 - doing HEP hypothyroidism managed by endo, Dr Renne Crigler, sees yearly (November)  She is overall doing ok Continues to struggle with anxiety, doing counseling, not interested in medication Afraid of walking outside She is concerned about weight gain - she will be starting [facility] living program [facility] Takes her BP medication daily as prescribed, denies any se No acute concerns today  Lab Results  Component Value Date   CREATININE 0.70 06/23/2019   BUN 17 06/23/2019   NA 144 06/23/2019   K 3.8 06/23/2019   CL 101 06/23/2019   CO2 27 06/23/2019    Depression screen PHQ 2/9 09/04/2019 06/23/2019 10/02/2018  Decreased Interest 0 0 0  Down, Depressed, Hopeless 0 0 0  PHQ - 2 Score 0 0 0  Altered sleeping - - -  Tired, decreased energy - - -  Change in appetite - - -  Feeling bad or failure about yourself  - - -  Trouble concentrating - - -  Moving slowly or fidgety/restless - - -  Suicidal thoughts - - -  PHQ-9 Score - - -    Fall Risk  10/07/2019 09/04/2019 06/23/2019 10/02/2018 08/26/2018  Falls in the past year? 1 0 1 0 0  Number falls in past yr: 1 1 1  0 0  Comment - slipped on carpet[place] - - -  Injury with Fall? 0 0 0 0 0  Risk for fall due to : Impaired vision;Impaired mobility History of fall(s) - - -  Follow up - Falls evaluation completed;Education provided Falls evaluation completed Falls evaluation completed Falls  evaluation completed;Education provided;Falls prevention discussed     Allergies  Allergen Reactions  . Bactrim [Sulfamethoxazole-Trimethoprim]   . Epinephrine     shakey  . Erythromycin   . Sulfa Antibiotics Rash    Prior to Admission medications   Medication Sig Start Date End Date Taking? Authorizing Provider  Ascorbic Acid (VITAMIN C) 1000 MG tablet Take 1,000 mg by mouth daily. One tablet a day   Yes [provider]  benazepril-hydrochlorthiazide (LOTENSIN HCT) 20-12.5 MG tablet Take 2 tablets by mouth daily. 06/23/19  Yes [name], MD  cholecalciferol (VITAMIN D3) 25 MCG (1000 UNIT) tablet Take 2,000 Units by mouth daily.   Yes [provider]  hydrALAZINE (APRESOLINE) 25 MG tablet TAKE 2 TABLETS BY MOUTH EVERY MORNING, THEN 1 TABLET BY MOUTH AT LUNCH AND 2 TABLETS AT [facility] 09/16/19  Yes [name], MD  levothyroxine (SYNTHROID) 112 MCG tablet TAKE 1 TABLET BY MOUTH EVERY DAY BEFORE BREAKFAST 12/20/18  Yes [name], MD  liothyronine (CYTOMEL) 5 MCG tablet TAKE 1 TABLET BY MOUTH EVERY DAY FOR THYROID 12/20/18  Yes [name], MD  metoprolol succinate (TOPROL-XL) 25 MG 24 hr tablet TAKE 1 TABLET(25 MG) BY MOUTH DAILY 07/08/19  Yes [name], MD    Past Medical History:  Diagnosis Date  . Abnormal perimenopausal bleeding  2005  . Allergy   . Anemia   . Anxiety   . Dysuria 2004  . Endometrial polyp 2006  . Environmental allergies   . Fibroid 02/19/03  . H/O varicella   . History of measles, mumps, or rubella   . Hx of colonic polyps 12/15/2010  . Hypertension 07-19-2010   echo for pre-op EF 55%  normal left wall thickness LA mildly dialated with mitral and tricuspid regurgatation  . Hypothyroidism   . Interstitial cystitis   . Menopausal symptoms 2004  . Obesity   . Osteopenia 02/2012   -1.8 T score right femur neck  . PMB (postmenopausal bleeding) 06/21/10  . Sleep apnea   . Urinary incontinence 2011  . Vitamin D  deficiency    history of    Past Surgical History:  Procedure Laterality Date  . COLONOSCOPY  12/15/10  . HYSTEROSCOPY  2006 and May 2012   for fibroids, endometrial polyps  . MOUTH SURGERY    . MYOMECTOMY  1974    Social History   Tobacco Use  . Smoking status: Never Smoker  . Smokeless tobacco: Never Used  Substance Use Topics  . Alcohol use: Yes    Alcohol/week: 1.0 standard drink    Types: 1 Glasses of wine per week    Comment: occasional (1-2 times per week)    Family History  Problem Relation Age of Onset  . Kidney disease Father        from uremia  . Benign prostatic hyperplasia Father   . Hypertension Mother   . Colon cancer Paternal Uncle   . Clotting disorder Maternal Grandmother        ????    Review of Systems  Constitutional: Negative for chills and fever.  Respiratory: Negative for cough and shortness of breath.   Cardiovascular: Negative for chest pain, palpitations and leg swelling.  Gastrointestinal: Negative for abdominal pain, nausea and vomiting.     OBJECTIVE:  Today's Vitals   10/07/19 1003  BP: 134/82  Pulse: 61  Temp: 98 F (36.7 C)  Weight: 184 lb (83.5 kg)  Height: 5' 1.5" (1.562 m)  PF: 98 L/min   Body mass index is 34.2 kg/m.   Physical Exam Vitals and nursing note reviewed.  Constitutional:      Appearance: She is well-developed.  HENT:     Head: Normocephalic and atraumatic.     Mouth/Throat:     Pharynx: No oropharyngeal exudate.  Eyes:     General: No scleral icterus.    Conjunctiva/sclera: Conjunctivae normal.     Pupils: Pupils are equal, round, and reactive to light.  Cardiovascular:     Rate and Rhythm: Normal rate and regular rhythm.     Heart sounds: Normal heart sounds. No murmur heard.  No friction rub. No gallop.   Pulmonary:     Effort: Pulmonary effort is normal.     Breath sounds: Normal breath sounds. No wheezing or rales.  Musculoskeletal:     Cervical back: Neck supple.  Skin:    General:  Skin is warm and dry.  Neurological:     Mental Status: She is alert and oriented to person, place, and time.     No results found for this or any previous visit (from the past 24 hour(s)).  No results found.   ASSESSMENT and PLAN  1. Essential hypertension, benign Controlled. Continue current regime. Will be starting active living program, discussed possibility of referral to medical weight mgt.  2. Anxiety Not  well controlled. Engaged in counseling, declines medications at this time  3. Primary osteoarthritis of left knee Completed PT, cont HEP. Declines ortho referral  4. Hypothyroidism, unspecified type Managed by endo  Return in about 6 months (around 04/08/2020).    [name], MD [facility] [place], [place] [address] Ph.  [phone] Fax [phone]

## 2019-10-07 NOTE — Patient Instructions (Signed)
° ° ° °  If you have lab work done today you will be contacted with your lab results within the next 2 weeks.  If you have not heard from us then please contact us. The fastest way to get your results is to register for My Chart. ° ° °IF you received an x-ray today, you will receive an invoice from [facility]. Please contact [facility] at [phone] with questions or concerns regarding your invoice.  ° °IF you received labwork today, you will receive an invoice from [facility]. Please contact [facility] at [phone] with questions or concerns regarding your invoice.  ° °Our billing staff will not be able to assist you with questions regarding bills from these companies. ° °You will be contacted with the lab results as soon as they are available. The fastest way to get your results is to activate your My Chart account. Instructions are located on the last page of this paperwork. If you have not heard from us regarding the results in 2 weeks, please contact this office. °  ° ° ° °

## 2019-10-08 ENCOUNTER — Ambulatory Visit (INDEPENDENT_AMBULATORY_CARE_PROVIDER_SITE_OTHER): Payer: Medicare PPO | Admitting: Psychology

## 2019-10-08 ENCOUNTER — Ambulatory Visit: Payer: Medicare PPO | Admitting: Physical Therapy

## 2019-10-08 DIAGNOSIS — F319 Bipolar disorder, unspecified: Secondary | ICD-10-CM

## 2019-10-14 ENCOUNTER — Encounter: Payer: Self-pay | Admitting: Family Medicine

## 2019-10-15 ENCOUNTER — Ambulatory Visit: Payer: Medicare PPO | Attending: Family Medicine | Admitting: Physical Therapy

## 2019-10-15 ENCOUNTER — Other Ambulatory Visit: Payer: Self-pay

## 2019-10-15 ENCOUNTER — Ambulatory Visit: Payer: Medicare PPO | Admitting: Psychology

## 2019-10-15 DIAGNOSIS — Z9181 History of falling: Secondary | ICD-10-CM | POA: Insufficient documentation

## 2019-10-15 DIAGNOSIS — M6281 Muscle weakness (generalized): Secondary | ICD-10-CM | POA: Diagnosis not present

## 2019-10-15 DIAGNOSIS — R296 Repeated falls: Secondary | ICD-10-CM

## 2019-10-15 DIAGNOSIS — M25662 Stiffness of left knee, not elsewhere classified: Secondary | ICD-10-CM | POA: Diagnosis not present

## 2019-10-15 DIAGNOSIS — M25661 Stiffness of right knee, not elsewhere classified: Secondary | ICD-10-CM | POA: Insufficient documentation

## 2019-10-15 NOTE — Therapy (Signed)
[place], [place], [address] Phone: [phone]   Fax:  [phone]  Physical Therapy Treatment and Re-certification  Patient Details  Name: [name] MRN: [id number] Date of Birth: 1945-05-02 Referring Provider (PT): [name], MD   Encounter Date: 10/15/2019   PT End of Session - 10/15/19 1049    Visit Number 6    Number of Visits [place] pending    Authorization - Visit Number 6    Authorization - Number of Visits 16    PT Start Time 1050    PT Stop Time 1134    PT Time Calculation (min) 44 min    Activity Tolerance Patient tolerated treatment well    Behavior During Therapy [facility] for tasks assessed/performed           Past Medical History:  Diagnosis Date  . Abnormal perimenopausal bleeding 2005  . Allergy   . Anemia   . Anxiety   . Dysuria 2004  . Endometrial polyp 2006  . Environmental allergies   . Fibroid 02/19/03  . H/O varicella   . History of measles, mumps, or rubella   . Hx of colonic polyps 12/15/2010  . Hypertension 07-19-2010   echo for pre-op EF 55%  normal left wall thickness LA mildly dialated with mitral and tricuspid regurgatation  . Hypothyroidism   . Interstitial cystitis   . Menopausal symptoms 2004  . Obesity   . Osteopenia 02/2012   -1.8 T score right femur neck  . PMB (postmenopausal bleeding) 06/21/10  . Sleep apnea   . Urinary incontinence 2011  . Vitamin D deficiency    history of    Past Surgical History:  Procedure Laterality Date  . COLONOSCOPY  12/15/10  . HYSTEROSCOPY  2006 and May 2012   for fibroids, endometrial polyps  . MOUTH SURGERY    . MYOMECTOMY  1974    There were no vitals filed for this visit.   Subjective Assessment - 10/15/19 1057    Subjective Pt notes that she's been hurting sometimes along lateral knee.    Currently in Pain? No/denies                              [facility] Adult PT Treatment/Exercise - 10/15/19 0001      Knee/Hip Exercises: Aerobic   Nustep L5 x 5 min      Knee/Hip Exercises: Machines for Strengthening   Cybex Leg Press 35# x 10 DL    Hip Cybex x10 bilat hip abduction and x10 bilat hip extension with 25#      Knee/Hip Exercises: Standing   Forward Step Up Right;2 sets;10 reps   cues    SLS 8.1 sec on L, 11 sec on R; 12 sec on L, 11 sec on R    Other Standing Knee Exercises on airex:                     PT Short Term Goals - 10/15/19 1103      PT SHORT TERM GOAL #1   Title Pt will be independent with initial HEP    Time 4    Period Weeks    Status Achieved    Target Date 09/09/19      PT SHORT TERM GOAL #2   Title Pt will improve bilat hip strength to at least 4/5  Baseline 3+/5 hip flexion and abduction, 4-/5 hip extension bilaterally    Time 4    Period Weeks    Status On-going    Target Date 09/09/19      PT SHORT TERM GOAL #3   Title Pt will have improved SLS to at least 8 sec bilaterally for improved step/stair negotiation    Baseline 4 sec on L, 3 sec on R    Time 4    Period Weeks    Status Achieved    Target Date 09/09/19      PT SHORT TERM GOAL #4   Title Pt will improve stance time on foam with feet together to at least 20 sec with no LOB    Baseline 6 sec before LOB    Time 4    Period Weeks    Status Achieved    Target Date 10/15/19             PT Long Term Goals - 10/15/19 1102      PT LONG TERM GOAL #1   Title Pt will be independent with HEP    Time 8    Period Weeks    Status Achieved      PT LONG TERM GOAL #2   Title Pt will improve bilat hip strength to 5/5 in all directions    Time 6    Period Weeks    Status Revised    Target Date 11/26/19      PT LONG TERM GOAL #3   Title Pt will be able to negotiate 5 steps without use of HR    Baseline Unable    Time 6    Period Weeks    Status Revised    Target Date 11/26/19       PT LONG TERM GOAL #4   Title Pt will improve SLS to 10 sec bilaterally    Time 8    Period Weeks    Status Achieved      PT LONG TERM GOAL #5   Title Pt will be able to safely come off 5" curb without LOB    Baseline Feels okay with small curb    Time 6    Period Weeks    Status New    Target Date 11/26/19                 Plan - 10/15/19 1123    Clinical Impression Statement Re-evaluation performed today. Pt has been meeting all goals except for her step and strength goals. Pt has met her balance goals. Pt with decreased hip and quad weakness to pull herself up/on step without handrail. Pt would benefit from continued PT to work heavily on her strength for ease of stair and curb negotiation. Pt limited due to R lateral knee/calf pain.    Personal Factors and Comorbidities Age;Fitness;Comorbidity 1    Comorbidities osteoarthritis    Examination-Activity Limitations Locomotion Level;Squat;Stairs    Examination-Participation Restrictions Community Activity;Yard Work    Stability/Clinical Decision Making Stable/Uncomplicated    Rehab Potential Good    PT Frequency 1x / week    PT Duration 8 weeks    PT Treatment/Interventions ADLs/Self Care Home Management;Aquatic Therapy;Cryotherapy;Electrical Stimulation;Iontophoresis 60m/ml Dexamethasone;Moist Heat;Gait training;Stair training;Functional mobility training;Therapeutic activities;Therapeutic exercise;Balance training;Neuromuscular re-education;Patient/family education;Manual techniques;Dry needling;Taping    PT Next Visit Plan Assess response to HEP. Continue hip strengthening. Progress pt with exercises involving steps, closed chain exercises, and narrow BOS. Focus on quad strengthening and control as able (may need  to scale down to SAQ). Assess response to stretching for ankles.    PT Home Exercise Plan Access Code [email or web address]    Consulted and Agree with Plan of Care Patient           Patient will benefit from skilled  therapeutic intervention in order to improve the following deficits and impairments:  Abnormal gait, Difficulty walking, Decreased balance, Improper body mechanics, Decreased mobility, Decreased strength, Increased edema, Postural dysfunction, Hypomobility, Decreased activity tolerance  Visit Diagnosis: Repeated falls  History of falling  Muscle weakness (generalized)  Stiffness of left knee, not elsewhere classified  Stiffness of right knee, not elsewhere classified     Problem List Patient Active Problem List   Diagnosis Date Noted  . Cyst of skin 10/02/2018  . Obstructive sleep apnea treated with continuous positive airway pressure (CPAP) 07/02/2017  . Osteopenia 09/01/2016  . Urinary incontinence in female 09/01/2016  . Vitamin D deficiency 09/01/2016  . BMI 35.0-35.9,adult 11/16/2015  . Essential hypertension, benign 04/28/2012  . Osteoarthritis of left knee 04/28/2012  . Hypothyroidism 04/28/2012  . Hx of colonic polyps 12/15/2010  . Anxiety 11/17/2010    Barbara Keng April Ma L Salihah Peckham PT, DPT 10/15/2019, 12:38 PM  [facility] [address] [place], [place], [address] Phone: [phone]   Fax:  [phone]  Name: [name] MRN: [id number] Date of Birth: 1945/11/05

## 2019-10-21 DIAGNOSIS — G4733 Obstructive sleep apnea (adult) (pediatric): Secondary | ICD-10-CM | POA: Diagnosis not present

## 2019-10-22 ENCOUNTER — Other Ambulatory Visit: Payer: Self-pay

## 2019-10-22 ENCOUNTER — Ambulatory Visit: Payer: Medicare PPO | Admitting: Physical Therapy

## 2019-10-22 VITALS — BP 166/70

## 2019-10-22 DIAGNOSIS — Z9181 History of falling: Secondary | ICD-10-CM

## 2019-10-22 DIAGNOSIS — M6281 Muscle weakness (generalized): Secondary | ICD-10-CM | POA: Diagnosis not present

## 2019-10-22 DIAGNOSIS — R296 Repeated falls: Secondary | ICD-10-CM | POA: Diagnosis not present

## 2019-10-22 DIAGNOSIS — M25662 Stiffness of left knee, not elsewhere classified: Secondary | ICD-10-CM

## 2019-10-22 DIAGNOSIS — M25661 Stiffness of right knee, not elsewhere classified: Secondary | ICD-10-CM | POA: Diagnosis not present

## 2019-10-22 NOTE — Therapy (Addendum)
[place], [place], [address] Phone: [phone]   Fax:  [phone]  Physical Therapy Treatment  Patient Details  Name: [name] MRN: [id number] Date of Birth: May 29, 1945 Referring Provider (PT): [name], MD   Encounter Date: 10/22/2019   PT End of Session - 10/22/19 1037    Visit Number 7    Number of Visits 16    Date for PT Re-Evaluation 11/26/19    Authorization Type [employer] -- Josem Kaufmann pending    Authorization - Visit Number 7    Authorization - Number of Visits 16    PT Start Time 1040    Activity Tolerance Patient tolerated treatment well    Behavior During Therapy [facility] for tasks assessed/performed           Past Medical History:  Diagnosis Date  . Abnormal perimenopausal bleeding 2005  . Allergy   . Anemia   . Anxiety   . Dysuria 2004  . Endometrial polyp 2006  . Environmental allergies   . Fibroid 02/19/03  . H/O varicella   . History of measles, mumps, or rubella   . Hx of colonic polyps 12/15/2010  . Hypertension 07-19-2010   echo for pre-op EF 55%  normal left wall thickness LA mildly dialated with mitral and tricuspid regurgatation  . Hypothyroidism   . Interstitial cystitis   . Menopausal symptoms 2004  . Obesity   . Osteopenia 02/2012   -1.8 T score right femur neck  . PMB (postmenopausal bleeding) 06/21/10  . Sleep apnea   . Urinary incontinence 2011  . Vitamin D deficiency    history of    Past Surgical History:  Procedure Laterality Date  . COLONOSCOPY  12/15/10  . HYSTEROSCOPY  2006 and May 2012   for fibroids, endometrial polyps  . MOUTH SURGERY    . MYOMECTOMY  1974    Vitals:   10/22/19 1114  BP: (!) 166/70     Subjective Assessment - 10/22/19 1044    Subjective Pt states she's been going to an active living class and was limited to 10 minutes of walking. Pt reports difficulty sleeping last night after arguing with her cousin -- she feels  tired today.    Patient Stated Goals Decrease falls    Currently in Pain? No/denies                             [facility] Adult PT Treatment/Exercise - 10/22/19 0001      Knee/Hip Exercises: Machines for Strengthening   Cybex Leg Press 55# x10 DL, 65# 2x10 DL    Hip Cybex 2x10 bilat hip abduction 25# and 2x10 bilat hip extension with 37.5#      Knee/Hip Exercises: Standing   Other Standing Knee Exercises deadlift (butt to wall for cueing) x 10   Limited due to pt report of dizziness (see vitals)                   PT Short Term Goals - 10/15/19 1103      PT SHORT TERM GOAL #1   Title Pt will be independent with initial HEP    Time 4    Period Weeks    Status Achieved    Target Date 09/09/19      PT SHORT TERM GOAL #2   Title Pt will improve bilat hip strength to at least 4/5    Baseline 3+/5  hip flexion and abduction, 4-/5 hip extension bilaterally    Time 4    Period Weeks    Status On-going    Target Date 09/09/19      PT SHORT TERM GOAL #3   Title Pt will have improved SLS to at least 8 sec bilaterally for improved step/stair negotiation    Baseline 4 sec on L, 3 sec on R    Time 4    Period Weeks    Status Achieved    Target Date 09/09/19      PT SHORT TERM GOAL #4   Title Pt will improve stance time on foam with feet together to at least 20 sec with no LOB    Baseline 6 sec before LOB    Time 4    Period Weeks    Status Achieved    Target Date 10/15/19             PT Long Term Goals - 10/15/19 1102      PT LONG TERM GOAL #1   Title Pt will be independent with HEP    Time 8    Period Weeks    Status Achieved      PT LONG TERM GOAL #2   Title Pt will improve bilat hip strength to 5/5 in all directions    Time 6    Period Weeks    Status Revised    Target Date 11/26/19      PT LONG TERM GOAL #3   Title Pt will be able to negotiate 5 steps without use of HR    Baseline Unable    Time 6    Period Weeks    Status Revised     Target Date 11/26/19      PT LONG TERM GOAL #4   Title Pt will improve SLS to 10 sec bilaterally    Time 8    Period Weeks    Status Achieved      PT LONG TERM GOAL #5   Title Pt will be able to safely come off 5" curb without LOB    Baseline Feels okay with small curb    Time 6    Period Weeks    Status New    Target Date 11/26/19                 Plan - 10/22/19 1115    Clinical Impression Statement Treatment focused on increasing strength. Initiated proper form with deadlift to continue to improve glute strength to perform steps. Pt limited due to feelings of dizziness this session. BP checked and was initially 166/70 but came down to 150s/60s. Pt with no complaints of dizziness at end of session.    Personal Factors and Comorbidities Age;Fitness;Comorbidity 1    Comorbidities osteoarthritis    Examination-Activity Limitations Locomotion Level;Squat;Stairs    Examination-Participation Restrictions Community Activity;Yard Work    Stability/Clinical Decision Making Stable/Uncomplicated    Rehab Potential Good    PT Frequency 1x / week    PT Duration 8 weeks    PT Treatment/Interventions ADLs/Self Care Home Management;Aquatic Therapy;Cryotherapy;Electrical Stimulation;Iontophoresis 4mg /ml Dexamethasone;Moist Heat;Gait training;Stair training;Functional mobility training;Therapeutic activities;Therapeutic exercise;Balance training;Neuromuscular re-education;Patient/family education;Manual techniques;Dry needling;Taping    PT Next Visit Plan Assess response to HEP. Continue hip strengthening. Progress pt with exercises involving steps, closed chain exercises, and narrow BOS. Working on deadlift.    PT Home Exercise Plan Access Code [email or web address]    Consulted and Agree with Plan of Care Patient  Patient will benefit from skilled therapeutic intervention in order to improve the following deficits and impairments:  Abnormal gait, Difficulty walking, Decreased balance,  Improper body mechanics, Decreased mobility, Decreased strength, Increased edema, Postural dysfunction, Hypomobility, Decreased activity tolerance  Visit Diagnosis: Repeated falls  History of falling  Muscle weakness (generalized)  Stiffness of left knee, not elsewhere classified  Stiffness of right knee, not elsewhere classified     Problem List Patient Active Problem List   Diagnosis Date Noted  . Cyst of skin 10/02/2018  . Obstructive sleep apnea treated with continuous positive airway pressure (CPAP) 07/02/2017  . Osteopenia 09/01/2016  . Urinary incontinence in female 09/01/2016  . Vitamin D deficiency 09/01/2016  . BMI 35.0-35.9,adult 11/16/2015  . Essential hypertension, benign 04/28/2012  . Osteoarthritis of left knee 04/28/2012  . Hypothyroidism 04/28/2012  . Hx of colonic polyps 12/15/2010  . Anxiety 11/17/2010    Marae Cottrell April Ma L Tilla Wilborn PT, DPT 10/22/2019, 11:31 AM  [facility] [address] [place], [place], [address] Phone: [phone]   Fax:  [phone]  Name: VIRDA BETTERS MRN: [id number] Date of Birth: 14-Jun-1945

## 2019-10-23 ENCOUNTER — Ambulatory Visit (INDEPENDENT_AMBULATORY_CARE_PROVIDER_SITE_OTHER): Payer: Medicare PPO | Admitting: Psychology

## 2019-10-23 DIAGNOSIS — F319 Bipolar disorder, unspecified: Secondary | ICD-10-CM | POA: Diagnosis not present

## 2019-10-28 ENCOUNTER — Ambulatory Visit: Payer: Medicare PPO | Admitting: Physical Therapy

## 2019-10-30 ENCOUNTER — Ambulatory Visit (INDEPENDENT_AMBULATORY_CARE_PROVIDER_SITE_OTHER): Payer: Medicare PPO | Admitting: Psychology

## 2019-10-30 DIAGNOSIS — F411 Generalized anxiety disorder: Secondary | ICD-10-CM

## 2019-11-03 ENCOUNTER — Ambulatory Visit: Payer: Medicare PPO | Admitting: Physical Therapy

## 2019-11-03 ENCOUNTER — Encounter: Payer: Self-pay | Admitting: Physical Therapy

## 2019-11-03 ENCOUNTER — Other Ambulatory Visit: Payer: Self-pay

## 2019-11-03 DIAGNOSIS — M25662 Stiffness of left knee, not elsewhere classified: Secondary | ICD-10-CM | POA: Diagnosis not present

## 2019-11-03 DIAGNOSIS — M25661 Stiffness of right knee, not elsewhere classified: Secondary | ICD-10-CM | POA: Diagnosis not present

## 2019-11-03 DIAGNOSIS — R296 Repeated falls: Secondary | ICD-10-CM

## 2019-11-03 DIAGNOSIS — Z9181 History of falling: Secondary | ICD-10-CM

## 2019-11-03 DIAGNOSIS — M6281 Muscle weakness (generalized): Secondary | ICD-10-CM | POA: Diagnosis not present

## 2019-11-03 NOTE — Therapy (Signed)
[place], [place], [address] Phone: [phone]   Fax:  [phone]  Physical Therapy Treatment  Patient Details  Name: [name] MRN: [id number] Date of Birth: [birth year] Referring Provider (PT): [name], MD   Encounter Date: 11/03/2019   PT End of Session - 11/03/19 1037    Visit Number 8    Number of Visits 16    Date for PT Re-Evaluation 11/26/19    Authorization Type [employer] -- Josem Kaufmann pending    Authorization - Visit Number 8    Authorization - Number of Visits 16    PT Start Time 2683    PT Stop Time 1128    PT Time Calculation (min) 48 min    Activity Tolerance Patient tolerated treatment well    Behavior During Therapy [facility] for tasks assessed/performed           Past Medical History:  Diagnosis Date  . Abnormal perimenopausal bleeding 2005  . Allergy   . Anemia   . Anxiety   . Dysuria 2004  . Endometrial polyp 2006  . Environmental allergies   . Fibroid 02/19/03  . H/O varicella   . History of measles, mumps, or rubella   . Hx of colonic polyps 12/15/2010  . Hypertension 07-19-2010   echo for pre-op EF 55%  normal left wall thickness LA mildly dialated with mitral and tricuspid regurgatation  . Hypothyroidism   . Interstitial cystitis   . Menopausal symptoms 2004  . Obesity   . Osteopenia 02/2012   -1.8 T score right femur neck  . PMB (postmenopausal bleeding) 06/21/10  . Sleep apnea   . Urinary incontinence 2011  . Vitamin D deficiency    history of    Past Surgical History:  Procedure Laterality Date  . COLONOSCOPY  12/15/10  . HYSTEROSCOPY  2006 and May 2012   for fibroids, endometrial polyps  . MOUTH SURGERY    . MYOMECTOMY  1974    There were no vitals filed for this visit.   Subjective Assessment - 11/03/19 1044    Subjective Pt reports increased stress from [place]. Pt states she didn't roll her muscles enough and is feeling pain along her  lateral knee.    Patient Stated Goals Decrease falls    Currently in Pain? No/denies                             [facility] Adult PT Treatment/Exercise - 11/03/19 0001      Knee/Hip Exercises: Stretches   Other Knee/Hip Stretches gastroc 2x30 sec bilat      Knee/Hip Exercises: Machines for Strengthening   Cybex Leg Press 20# x10 DL, 65# x5 DL   attempted; however, pt with too much pain today   Hip Cybex 2x10 bilat hip abduction 35# and 2x10 bilat hip extension with 37.5#      Knee/Hip Exercises: Standing   Forward Step Up Right;2 sets;10 reps   initially 2" step then 4" step   Other Standing Knee Exercises monster walk laterally green tband 3x10    Other Standing Knee Exercises deadlift (butt to wall for cueing) 2x 10 with 5#      Manual Therapy   Manual Therapy Soft tissue mobilization;Myofascial release    Manual therapy comments self massage with massage stick along R gastroc    Soft tissue mobilization R anterior tibialis, R proximal gastroc with TPR  PT Short Term Goals - 10/15/19 1103      PT SHORT TERM GOAL #1   Title Pt will be independent with initial HEP    Time 4    Period Weeks    Status Achieved    Target Date 09/09/19      PT SHORT TERM GOAL #2   Title Pt will improve bilat hip strength to at least 4/5    Baseline 3+/5 hip flexion and abduction, 4-/5 hip extension bilaterally    Time 4    Period Weeks    Status On-going    Target Date 09/09/19      PT SHORT TERM GOAL #3   Title Pt will have improved SLS to at least 8 sec bilaterally for improved step/stair negotiation    Baseline 4 sec on L, 3 sec on R    Time 4    Period Weeks    Status Achieved    Target Date 09/09/19      PT SHORT TERM GOAL #4   Title Pt will improve stance time on foam with feet together to at least 20 sec with no LOB    Baseline 6 sec before LOB    Time 4    Period Weeks    Status Achieved    Target Date 10/15/19              PT Long Term Goals - 10/15/19 1102      PT LONG TERM GOAL #1   Title Pt will be independent with HEP    Time 8    Period Weeks    Status Achieved      PT LONG TERM GOAL #2   Title Pt will improve bilat hip strength to 5/5 in all directions    Time 6    Period Weeks    Status Revised    Target Date 11/26/19      PT LONG TERM GOAL #3   Title Pt will be able to negotiate 5 steps without use of HR    Baseline Unable    Time 6    Period Weeks    Status Revised    Target Date 11/26/19      PT LONG TERM GOAL #4   Title Pt will improve SLS to 10 sec bilaterally    Time 8    Period Weeks    Status Achieved      PT LONG TERM GOAL #5   Title Pt will be able to safely come off 5" curb without LOB    Baseline Feels okay with small curb    Time 6    Period Weeks    Status New    Target Date 11/26/19                 Plan - 11/03/19 1107    Clinical Impression Statement Continued focus on hip and glute strengthening. Pt with increased tightness along lateral gastroc/anterior tibialis causing pain during knee bending -- provided manual therapy and stretching. Pt tolerated treatment well with no issues this session.    Personal Factors and Comorbidities Age;Fitness;Comorbidity 1    Comorbidities osteoarthritis    Examination-Activity Limitations Locomotion Level;Squat;Stairs    Examination-Participation Restrictions Community Activity;Yard Work    Stability/Clinical Decision Making Stable/Uncomplicated    Rehab Potential Good    PT Frequency 1x / week    PT Duration 8 weeks    PT Treatment/Interventions ADLs/Self Care Home Management;Aquatic Therapy;Cryotherapy;Electrical Stimulation;Iontophoresis 4mg /ml Dexamethasone;Moist Heat;Gait training;Stair  training;Functional mobility training;Therapeutic activities;Therapeutic exercise;Balance training;Neuromuscular re-education;Patient/family education;Manual techniques;Dry needling;Taping    PT Next Visit Plan Assess response to  HEP. Continue hip strengthening. Progress pt with exercises involving steps, closed chain exercises, and narrow BOS. Working on deadlift.    PT Home Exercise Plan Access Code [email or web address]    Consulted and Agree with Plan of Care Patient           Patient will benefit from skilled therapeutic intervention in order to improve the following deficits and impairments:  Abnormal gait, Difficulty walking, Decreased balance, Improper body mechanics, Decreased mobility, Decreased strength, Increased edema, Postural dysfunction, Hypomobility, Decreased activity tolerance  Visit Diagnosis: Repeated falls  History of falling  Muscle weakness (generalized)  Stiffness of left knee, not elsewhere classified  Stiffness of right knee, not elsewhere classified     Problem List Patient Active Problem List   Diagnosis Date Noted  . Cyst of skin 10/02/2018  . Obstructive sleep apnea treated with continuous positive airway pressure (CPAP) 07/02/2017  . Osteopenia 09/01/2016  . Urinary incontinence in female 09/01/2016  . Vitamin D deficiency 09/01/2016  . BMI 35.0-35.9,adult 11/16/2015  . Essential hypertension, benign 04/28/2012  . Osteoarthritis of left knee 04/28/2012  . Hypothyroidism 04/28/2012  . Hx of colonic polyps 12/15/2010  . Anxiety 11/17/2010    Timmie Calix April Ma L Tabari Volkert PT, DPT 11/03/2019, 11:43 AM  [facility] [address] [place], [place], [address] Phone: [phone]   Fax:  [phone]  Name: [name] MRN: [id number] Date of Birth: [birth year]

## 2019-11-12 ENCOUNTER — Ambulatory Visit: Payer: Medicare PPO | Admitting: Physical Therapy

## 2019-11-17 ENCOUNTER — Encounter: Payer: Self-pay | Admitting: Physical Therapy

## 2019-11-17 ENCOUNTER — Ambulatory Visit: Payer: Medicare PPO | Attending: Family Medicine | Admitting: Physical Therapy

## 2019-11-17 ENCOUNTER — Other Ambulatory Visit: Payer: Self-pay

## 2019-11-17 DIAGNOSIS — M25662 Stiffness of left knee, not elsewhere classified: Secondary | ICD-10-CM | POA: Diagnosis not present

## 2019-11-17 DIAGNOSIS — M6281 Muscle weakness (generalized): Secondary | ICD-10-CM | POA: Diagnosis not present

## 2019-11-17 DIAGNOSIS — Z9181 History of falling: Secondary | ICD-10-CM | POA: Insufficient documentation

## 2019-11-17 DIAGNOSIS — M25661 Stiffness of right knee, not elsewhere classified: Secondary | ICD-10-CM | POA: Diagnosis not present

## 2019-11-17 DIAGNOSIS — R296 Repeated falls: Secondary | ICD-10-CM

## 2019-11-17 NOTE — Therapy (Signed)
[place], [place], [address] Phone: [phone]   Fax:  [phone]  Physical Therapy Treatment  Patient Details  Name: [name] MRN: [id number] Date of Birth: [birth year] Referring Provider (PT): [name], MD   Encounter Date: 11/17/2019   PT End of Session - 11/17/19 1113    Visit Number 9    Number of Visits 16    Date for PT Re-Evaluation 11/26/19    Authorization Type [employer] -- Josem Kaufmann pending    Authorization - Visit Number 9    Authorization - Number of Visits 16    PT Start Time 4627    PT Stop Time 1140    PT Time Calculation (min) 49 min    Activity Tolerance Patient tolerated treatment well    Behavior During Therapy [facility] for tasks assessed/performed           Past Medical History:  Diagnosis Date  . Abnormal perimenopausal bleeding 2005  . Allergy   . Anemia   . Anxiety   . Dysuria 2004  . Endometrial polyp 2006  . Environmental allergies   . Fibroid 02/19/03  . H/O varicella   . History of measles, mumps, or rubella   . Hx of colonic polyps 12/15/2010  . Hypertension 07-19-2010   echo for pre-op EF 55%  normal left wall thickness LA mildly dialated with mitral and tricuspid regurgatation  . Hypothyroidism   . Interstitial cystitis   . Menopausal symptoms 2004  . Obesity   . Osteopenia 02/2012   -1.8 T score right femur neck  . PMB (postmenopausal bleeding) 06/21/10  . Sleep apnea   . Urinary incontinence 2011  . Vitamin D deficiency    history of    Past Surgical History:  Procedure Laterality Date  . COLONOSCOPY  12/15/10  . HYSTEROSCOPY  2006 and May 2012   for fibroids, endometrial polyps  . MOUTH SURGERY    . MYOMECTOMY  1974    There were no vitals filed for this visit.   Subjective Assessment - 11/17/19 1053    Subjective Pt reports some increased pain in her heels likely from her gastroc stretch.    Patient Stated Goals Decrease falls     Currently in Pain? Yes    Pain Score 2     Pain Location Heel    Pain Orientation Left;Right                             [facility] Adult PT Treatment/Exercise - 11/17/19 0001      Knee/Hip Exercises: Stretches   Passive Hamstring Stretch Right;30 seconds    Other Knee/Hip Stretches gastroc 2x30 sec bilat      Knee/Hip Exercises: Machines for Strengthening   Cybex Knee Flexion DL: 35# 2x10    Cybex Leg Press DL: 70# 2x10    Hip Cybex 2x10 bilat hip abduction 37.5# and 2x10 bilat hip extension with 37.5#      Knee/Hip Exercises: Standing   Heel Raises Both;10 reps    Forward Step Up Right;2 sets;10 reps    Step Down Right;10 reps    Other Standing Knee Exercises deadlift (butt to wall for cueing) 2x 10 with 5#      Modalities   Modalities Cryotherapy      Cryotherapy   Number Minutes Cryotherapy 10 Minutes    Cryotherapy Location Knee    Type of Cryotherapy Ice pack  PT Short Term Goals - 10/15/19 1103      PT SHORT TERM GOAL #1   Title Pt will be independent with initial HEP    Time 4    Period Weeks    Status Achieved    Target Date 09/09/19      PT SHORT TERM GOAL #2   Title Pt will improve bilat hip strength to at least 4/5    Baseline 3+/5 hip flexion and abduction, 4-/5 hip extension bilaterally    Time 4    Period Weeks    Status On-going    Target Date 09/09/19      PT SHORT TERM GOAL #3   Title Pt will have improved SLS to at least 8 sec bilaterally for improved step/stair negotiation    Baseline 4 sec on L, 3 sec on R    Time 4    Period Weeks    Status Achieved    Target Date 09/09/19      PT SHORT TERM GOAL #4   Title Pt will improve stance time on foam with feet together to at least 20 sec with no LOB    Baseline 6 sec before LOB    Time 4    Period Weeks    Status Achieved    Target Date 10/15/19             PT Long Term Goals - 10/15/19 1102      PT LONG TERM GOAL #1   Title Pt will be  independent with HEP    Time 8    Period Weeks    Status Achieved      PT LONG TERM GOAL #2   Title Pt will improve bilat hip strength to 5/5 in all directions    Time 6    Period Weeks    Status Revised    Target Date 11/26/19      PT LONG TERM GOAL #3   Title Pt will be able to negotiate 5 steps without use of HR    Baseline Unable    Time 6    Period Weeks    Status Revised    Target Date 11/26/19      PT LONG TERM GOAL #4   Title Pt will improve SLS to 10 sec bilaterally    Time 8    Period Weeks    Status Achieved      PT LONG TERM GOAL #5   Title Pt will be able to safely come off 5" curb without LOB    Baseline Feels okay with small curb    Time 6    Period Weeks    Status New    Target Date 11/26/19                 Plan - 11/17/19 1240    Clinical Impression Statement Continued to progress pt's LE strengthening. Pt able to demonstrate good knee control on steps with hand hold; however, without hand hold pt with increased pain and decreased quad control. Will continue to progress pt's single leg strengthening. Pt has met all LTG but she remains fearful, pained, and unsteady with single leg stance with stepping down.    Personal Factors and Comorbidities Age;Fitness;Comorbidity 1    Comorbidities osteoarthritis    Examination-Activity Limitations Locomotion Level;Squat;Stairs    Examination-Participation Restrictions Community Activity;Yard Work    Stability/Clinical Decision Making Stable/Uncomplicated    Rehab Potential Good    PT Frequency 1x / week  PT Duration 8 weeks    PT Treatment/Interventions ADLs/Self Care Home Management;Aquatic Therapy;Cryotherapy;Electrical Stimulation;Iontophoresis 47m/ml Dexamethasone;Moist Heat;Gait training;Stair training;Functional mobility training;Therapeutic activities;Therapeutic exercise;Balance training;Neuromuscular re-education;Patient/family education;Manual techniques;Dry needling;Taping    PT Next Visit Plan  Assess response to HEP. Continue quad strengthening/control for R LE in single leg. Progress pt with exercises involving steps, closed chain exercises, and narrow BOS. Working on deadlift.    PT Home Exercise Plan Access Code [email or web address]   Consulted and Agree with Plan of Care Patient           Patient will benefit from skilled therapeutic intervention in order to improve the following deficits and impairments:  Abnormal gait, Difficulty walking, Decreased balance, Improper body mechanics, Decreased mobility, Decreased strength, Increased edema, Postural dysfunction, Hypomobility, Decreased activity tolerance  Visit Diagnosis: Repeated falls  History of falling  Muscle weakness (generalized)  Stiffness of left knee, not elsewhere classified  Stiffness of right knee, not elsewhere classified     Problem List Patient Active Problem List   Diagnosis Date Noted  . Cyst of skin 10/02/2018  . Obstructive sleep apnea treated with continuous positive airway pressure (CPAP) 07/02/2017  . Osteopenia 09/01/2016  . Urinary incontinence in female 09/01/2016  . Vitamin D deficiency 09/01/2016  . BMI 35.0-35.9,adult 11/16/2015  . Essential hypertension, benign 04/28/2012  . Osteoarthritis of left knee 04/28/2012  . Hypothyroidism 04/28/2012  . Hx of colonic polyps 12/15/2010  . Anxiety 11/17/2010    Madinah Quarry April Ma L Siyona Coto PT, DPT 11/17/2019, 12:47 PM  [facility] [address]Phone: [phone]  Fax:  [phone] Name: Joy LATTERELLMRN: [id number] of Birth: 616-Sep-1947

## 2019-11-19 ENCOUNTER — Ambulatory Visit (INDEPENDENT_AMBULATORY_CARE_PROVIDER_SITE_OTHER): Payer: Medicare PPO | Admitting: Psychology

## 2019-11-19 DIAGNOSIS — F411 Generalized anxiety disorder: Secondary | ICD-10-CM | POA: Diagnosis not present

## 2019-11-25 ENCOUNTER — Ambulatory Visit (INDEPENDENT_AMBULATORY_CARE_PROVIDER_SITE_OTHER): Payer: Medicare PPO | Admitting: Psychology

## 2019-11-25 DIAGNOSIS — F319 Bipolar disorder, unspecified: Secondary | ICD-10-CM

## 2019-11-25 DIAGNOSIS — F411 Generalized anxiety disorder: Secondary | ICD-10-CM

## 2019-11-26 ENCOUNTER — Ambulatory Visit: Payer: Medicare PPO | Admitting: Physical Therapy

## 2019-11-28 ENCOUNTER — Other Ambulatory Visit: Payer: Self-pay

## 2019-11-28 ENCOUNTER — Telehealth (INDEPENDENT_AMBULATORY_CARE_PROVIDER_SITE_OTHER): Payer: Medicare PPO | Admitting: Family Medicine

## 2019-11-28 DIAGNOSIS — R42 Dizziness and giddiness: Secondary | ICD-10-CM

## 2019-11-28 NOTE — Progress Notes (Signed)
Virtual Visit Note  I connected with patient on 11/28/19 at 505pm by video epic and verified that I am speaking with the correct person using two identifiers. [name] is currently located at home and patient is currently with them during visit. The provider, [name], MD is located in their office at time of visit.  I discussed the limitations, risks, security and privacy concerns of performing an evaluation and management service by telephone and the availability of in person appointments. I also discussed with the patient that there may be a patient responsible charge related to this service. The patient expressed understanding and agreed to proceed.   I provided 19 minutes of non-face-to-face time during this encounter.  Chief Complaint  Patient presents with  . Dizziness    Pt stated that she has been feeling dizzy and feels like her equallibrium is off.    HPI ? She has had issues with intermittent balance in the past but in the past several weeks she has noticed a feeling of head being on water when going up or down ramps or when she makes a quick turn Denies any issues when she gets up from lying or sitting Thinks that fall she had several months ago might have been caused by this sensation She denies any changes in hearing or tinnitus She has been having increased sneezing and PND She did PT for bilateral knee She would like to see an ENT      Allergies  Allergen Reactions  . Bactrim [Sulfamethoxazole-Trimethoprim]   . Epinephrine     shakey  . Erythromycin   . Sulfa Antibiotics Rash    Prior to Admission medications   Medication Sig Start Date End Date Taking? Authorizing Provider  Ascorbic Acid (VITAMIN C) 1000 MG tablet Take 1,000 mg by mouth daily. One tablet a day   Yes [provider]  benazepril-hydrochlorthiazide (LOTENSIN HCT) 20-12.5 MG tablet Take 2 tablets by mouth daily. 06/23/19  Yes [name], MD  cholecalciferol  (VITAMIN D3) 25 MCG (1000 UNIT) tablet Take 2,000 Units by mouth daily.   Yes [provider]  hydrALAZINE (APRESOLINE) 25 MG tablet TAKE 2 TABLETS BY MOUTH EVERY MORNING, THEN 1 TABLET BY MOUTH AT LUNCH AND 2 TABLETS AT [facility] 09/16/19  Yes [name], MD  levothyroxine (SYNTHROID) 112 MCG tablet TAKE 1 TABLET BY MOUTH EVERY DAY BEFORE BREAKFAST 12/20/18  Yes [name], MD  liothyronine (CYTOMEL) 5 MCG tablet TAKE 1 TABLET BY MOUTH EVERY DAY FOR THYROID 12/20/18  Yes [name], MD  metoprolol succinate (TOPROL-XL) 25 MG 24 hr tablet TAKE 1 TABLET(25 MG) BY MOUTH DAILY 07/08/19  Yes [name], MD    Past Medical History:  Diagnosis Date  . Abnormal perimenopausal bleeding 2005  . Allergy   . Anemia   . Anxiety   . Dysuria 2004  . Endometrial polyp 2006  . Environmental allergies   . Fibroid 02/19/03  . H/O varicella   . History of measles, mumps, or rubella   . Hx of colonic polyps 12/15/2010  . Hypertension 07-19-2010   echo for pre-op EF 55%  normal left wall thickness LA mildly dialated with mitral and tricuspid regurgatation  . Hypothyroidism   . Interstitial cystitis   . Menopausal symptoms 2004  . Obesity   . Osteopenia 02/2012   -1.8 T score right femur neck  . PMB (postmenopausal bleeding) 06/21/10  . Sleep apnea   . Urinary incontinence 2011  .  Vitamin D deficiency    history of    Past Surgical History:  Procedure Laterality Date  . COLONOSCOPY  12/15/10  . HYSTEROSCOPY  2006 and May 2012   for fibroids, endometrial polyps  . MOUTH SURGERY    . MYOMECTOMY  1974    Social History   Tobacco Use  . Smoking status: Never Smoker  . Smokeless tobacco: Never Used  Substance Use Topics  . Alcohol use: Yes    Alcohol/week: 1.0 standard drink    Types: 1 Glasses of wine per week    Comment: occasional (1-2 times per week)    Family History  Problem Relation Age of Onset  . Kidney disease Father        from uremia  . Benign  prostatic hyperplasia Father   . Hypertension Mother   . Colon cancer Paternal Uncle   . Clotting disorder Maternal Grandmother        ????    ROS Per hpi  Objective  Vitals as reported by the patient: none  GEN: AAOx3, NAD HEENT: [place]/AT, pupils are symmetrical, EOMI, non-icteric sclera Resp: breathing comfortably, speaking in full sentences Skin: no rashes noted, no pallor Psych: good eye contact, normal mood and affect   ASSESSMENT and PLAN  1. Vertigo - Ambulatory referral to ENT  FOLLOW-UP: prn   The above assessment and management plan was discussed with the patient. The patient verbalized understanding of and has agreed to the management plan. Patient is aware to call the clinic if symptoms persist or worsen. Patient is aware when to return to the clinic for a follow-up visit. Patient educated on when it is appropriate to go to the emergency department.     [name], MD [facility] at [place], [place] [address] Ph.  [phone] Fax [phone]

## 2019-11-28 NOTE — Patient Instructions (Signed)
° ° ° °  If you have lab work done today you will be contacted with your lab results within the next 2 weeks.  If you have not heard from us then please contact us. The fastest way to get your results is to register for My Chart. ° ° °IF you received an x-ray today, you will receive an invoice from [facility]. Please contact [facility] at [phone] with questions or concerns regarding your invoice.  ° °IF you received labwork today, you will receive an invoice from [facility]. Please contact [facility] at [phone] with questions or concerns regarding your invoice.  ° °Our billing staff will not be able to assist you with questions regarding bills from these companies. ° °You will be contacted with the lab results as soon as they are available. The fastest way to get your results is to activate your My Chart account. Instructions are located on the last page of this paperwork. If you have not heard from us regarding the results in 2 weeks, please contact this office. °  ° ° ° °

## 2019-12-01 ENCOUNTER — Ambulatory Visit (INDEPENDENT_AMBULATORY_CARE_PROVIDER_SITE_OTHER): Payer: Medicare PPO | Admitting: Psychology

## 2019-12-01 DIAGNOSIS — F411 Generalized anxiety disorder: Secondary | ICD-10-CM | POA: Diagnosis not present

## 2019-12-03 ENCOUNTER — Other Ambulatory Visit: Payer: Self-pay

## 2019-12-03 ENCOUNTER — Ambulatory Visit: Payer: Medicare PPO | Admitting: Physical Therapy

## 2019-12-03 ENCOUNTER — Encounter: Payer: Self-pay | Admitting: Physical Therapy

## 2019-12-03 DIAGNOSIS — Z9181 History of falling: Secondary | ICD-10-CM

## 2019-12-03 DIAGNOSIS — M25662 Stiffness of left knee, not elsewhere classified: Secondary | ICD-10-CM

## 2019-12-03 DIAGNOSIS — M6281 Muscle weakness (generalized): Secondary | ICD-10-CM

## 2019-12-03 DIAGNOSIS — R296 Repeated falls: Secondary | ICD-10-CM

## 2019-12-03 DIAGNOSIS — M25661 Stiffness of right knee, not elsewhere classified: Secondary | ICD-10-CM | POA: Diagnosis not present

## 2019-12-03 NOTE — Therapy (Signed)
[place], [place], [address] Phone: [phone]   Fax:  [phone]  Physical Therapy Treatment and Re-Certification  Patient Details  Name: [name] MRN: [id number] Date of Birth: 1945/12/05 Referring Provider (PT): [name], MD   Encounter Date: 12/03/2019   PT End of Session - 12/03/19 1116    Visit Number 10    Number of Visits 16    Date for PT Re-Evaluation 11/26/19    Authorization Type [employer] -- Josem Kaufmann pending    Authorization - Visit Number 10    Authorization - Number of Visits 16    PT Start Time 1048    PT Stop Time 1130    PT Time Calculation (min) 42 min    Activity Tolerance Patient tolerated treatment well    Behavior During Therapy [facility] for tasks assessed/performed           Past Medical History:  Diagnosis Date  . Abnormal perimenopausal bleeding 2005  . Allergy   . Anemia   . Anxiety   . Dysuria 2004  . Endometrial polyp 2006  . Environmental allergies   . Fibroid 02/19/03  . H/O varicella   . History of measles, mumps, or rubella   . Hx of colonic polyps 12/15/2010  . Hypertension 07-19-2010   echo for pre-op EF 55%  normal left wall thickness LA mildly dialated with mitral and tricuspid regurgatation  . Hypothyroidism   . Interstitial cystitis   . Menopausal symptoms 2004  . Obesity   . Osteopenia 02/2012   -1.8 T score right femur neck  . PMB (postmenopausal bleeding) 06/21/10  . Sleep apnea   . Urinary incontinence 2011  . Vitamin D deficiency    history of    Past Surgical History:  Procedure Laterality Date  . COLONOSCOPY  12/15/10  . HYSTEROSCOPY  2006 and May 2012   for fibroids, endometrial polyps  . MOUTH SURGERY    . MYOMECTOMY  1974    There were no vitals filed for this visit.   Subjective Assessment - 12/03/19 1054    Subjective Pt reports noticing increased dysequilibrium with increased gait speed. Pt states she is following  up with ENT in November for this. Pt has increased her walking program to 30 min 2x/day and has noticed increased pain down her lateral calf.    Patient Stated Goals Decrease falls    Currently in Pain? Yes    Pain Score 2     Pain Location Calf    Pain Orientation Right;Left              [facility] PT Assessment - 12/03/19 0001      Strength   Right Hip Flexion 4/5    Right Hip Extension 4+/5    Right Hip ABduction 4+/5    Left Hip Flexion 4/5    Left Hip Extension 4+/5    Left Hip ABduction 4+/5    Right Knee Flexion --   Hamstring curl 45# DL   Right Knee Extension --   Knee extension machine 20# DL   Right Ankle Plantar Flexion 4+/5    Left Ankle Plantar Flexion 4+/5                         [facility] Adult PT Treatment/Exercise - 12/03/19 0001      Knee/Hip Exercises: Stretches   Other Knee/Hip Stretches gastroc 2x30 sec bilat    Other Knee/Hip  Stretches soleus stretch 2 x30 sec; fibularis stretch 2x30 sec      Knee/Hip Exercises: Machines for Strengthening   Cybex Knee Extension DL: 20# 2x10    Cybex Knee Flexion DL: 40#2x10      Knee/Hip Exercises: Standing   Forward Step Up Left;2 sets;10 reps;Step Height: 6"    Step Down Left;2 sets;10 reps;Step Height: 6"    Rocker Board 1 minute      Knee/Hip Exercises: Seated   Other Seated Knee/Hip Exercises ankle pumps 2x10      Manual Therapy   Soft tissue mobilization R gastroc/fibularis                  PT Education - 12/03/19 1154    Education Details Discussed that she may be progressing her walking program too fast if she's getting too much pain/soreness in her legs/calf. Discussed including rest days for stretching and strength building. Modified HEP and discussed safe progression of her walking program and meeting pt's goals.    Person(s) Educated Patient    Methods Explanation;Demonstration;Tactile cues;Handout    Comprehension Verbalized understanding;Returned demonstration;Verbal cues  required;Tactile cues required            PT Short Term Goals - 10/15/19 1103      PT SHORT TERM GOAL #1   Title Pt will be independent with initial HEP    Time 4    Period Weeks    Status Achieved    Target Date 09/09/19      PT SHORT TERM GOAL #2   Title Pt will improve bilat hip strength to at least 4/5    Baseline 3+/5 hip flexion and abduction, 4-/5 hip extension bilaterally    Time 4    Period Weeks    Status On-going    Target Date 09/09/19      PT SHORT TERM GOAL #3   Title Pt will have improved SLS to at least 8 sec bilaterally for improved step/stair negotiation    Baseline 4 sec on L, 3 sec on R    Time 4    Period Weeks    Status Achieved    Target Date 09/09/19      PT SHORT TERM GOAL #4   Title Pt will improve stance time on foam with feet together to at least 20 sec with no LOB    Baseline 6 sec before LOB    Time 4    Period Weeks    Status Achieved    Target Date 10/15/19             PT Long Term Goals - 12/03/19 1122      PT LONG TERM GOAL #1   Title Pt will be independent with HEP    Time 8    Period Weeks    Status Achieved      PT LONG TERM GOAL #2   Title Pt will improve bilat hip and knee strength to 5/5 in all directions    Time 4    Period Weeks    Status Revised    Target Date 12/31/19      PT LONG TERM GOAL #3   Title Pt will be able to negotiate 5 steps without use of HR    Baseline Unable    Time 4    Period Weeks    Status Revised    Target Date 12/31/19      PT LONG TERM GOAL #4   Title Pt will improve  SLS to 10 sec bilaterally    Time 8    Period Weeks    Status Achieved      PT LONG TERM GOAL #5   Title Pt will be able to safely come off 5" curb without LOB    Baseline Feels okay with small curb    Time 6    Period Weeks    Status Achieved                 Plan - 12/03/19 1127    Clinical Impression Statement Discussed pt's POC. Pt with R lateral posterior calf pain -- especially during leg  extension which may have been exacerbated from her increased walking. Provided pt with calf stretches accordingly. Treatment focused on practicing coming off 6" step with no support -- pt able to demonstrate good safety with coming off with R foot first. Pt with too much pain while placing full weight on R knee with even slight knee flexion during closed chain exercise. Treatment focused on continuing quad and bilat LE strengthening. Pt still has not met her stair goals and LE strength goals and is motivated to continue to work on this. Pt would benefit from 4 more visits of PT to reach goals.    Personal Factors and Comorbidities Age;Fitness;Comorbidity 1    Comorbidities osteoarthritis    Examination-Activity Limitations Locomotion Level;Squat;Stairs    Examination-Participation Restrictions Community Activity;Yard Work    Stability/Clinical Decision Making Stable/Uncomplicated    Rehab Potential Good    PT Frequency 1x / week    PT Duration 8 weeks    PT Treatment/Interventions ADLs/Self Care Home Management;Aquatic Therapy;Cryotherapy;Electrical Stimulation;Iontophoresis 77m/ml Dexamethasone;Moist Heat;Gait training;Stair training;Functional mobility training;Therapeutic activities;Therapeutic exercise;Balance training;Neuromuscular re-education;Patient/family education;Manual techniques;Dry needling;Taping    PT Next Visit Plan Continue quad strengthening/control for R LE in single leg. Progress pt with exercises involving steps, closed chain exercises.    PT Home Exercise Plan Access Code [email or web address]   Consulted and Agree with Plan of Care Patient           Patient will benefit from skilled therapeutic intervention in order to improve the following deficits and impairments:  Abnormal gait, Difficulty walking, Decreased balance, Improper body mechanics, Decreased mobility, Decreased strength, Increased edema, Postural dysfunction, Hypomobility, Decreased activity tolerance  Visit  Diagnosis: Repeated falls  History of falling  Muscle weakness (generalized)  Stiffness of left knee, not elsewhere classified  Stiffness of right knee, not elsewhere classified     Problem List Patient Active Problem List   Diagnosis Date Noted  . Cyst of skin 10/02/2018  . Obstructive sleep apnea treated with continuous positive airway pressure (CPAP) 07/02/2017  . Osteopenia 09/01/2016  . Urinary incontinence in female 09/01/2016  . Vitamin D deficiency 09/01/2016  . BMI 35.0-35.9,adult 11/16/2015  . Essential hypertension, benign 04/28/2012  . Osteoarthritis of left knee 04/28/2012  . Hypothyroidism 04/28/2012  . Hx of colonic polyps 12/15/2010  . Anxiety 11/17/2010    Ivelise Castillo April Ma L Gyan Cambre PT, DPT 12/03/2019, 11:59 AM  [facility]189 [address]GElma Center [place] [address]Phone: [phone]  Fax:  [phone] Name: Joy WHIPKEYMRN: [id number] of Birth: 6Mar 05, 1947

## 2019-12-08 ENCOUNTER — Ambulatory Visit (INDEPENDENT_AMBULATORY_CARE_PROVIDER_SITE_OTHER): Payer: Medicare PPO | Admitting: Psychology

## 2019-12-08 DIAGNOSIS — F319 Bipolar disorder, unspecified: Secondary | ICD-10-CM

## 2019-12-10 ENCOUNTER — Ambulatory Visit: Payer: Medicare PPO | Attending: Family Medicine | Admitting: Physical Therapy

## 2019-12-10 ENCOUNTER — Other Ambulatory Visit: Payer: Self-pay

## 2019-12-10 ENCOUNTER — Encounter: Payer: Self-pay | Admitting: Physical Therapy

## 2019-12-10 DIAGNOSIS — M25661 Stiffness of right knee, not elsewhere classified: Secondary | ICD-10-CM | POA: Diagnosis not present

## 2019-12-10 DIAGNOSIS — M6281 Muscle weakness (generalized): Secondary | ICD-10-CM | POA: Insufficient documentation

## 2019-12-10 DIAGNOSIS — Z9181 History of falling: Secondary | ICD-10-CM | POA: Diagnosis not present

## 2019-12-10 DIAGNOSIS — R296 Repeated falls: Secondary | ICD-10-CM | POA: Insufficient documentation

## 2019-12-10 DIAGNOSIS — M25662 Stiffness of left knee, not elsewhere classified: Secondary | ICD-10-CM | POA: Diagnosis not present

## 2019-12-10 NOTE — Therapy (Addendum)
[place], [place], [address] Phone: [phone]   Fax:  [phone]  Physical Therapy Treatment and Discharge  Patient Details  Name: [name] MRN: [id number] Date of Birth: [birth year] Referring Provider (PT): [name], MD   Encounter Date: 12/10/2019   PT End of Session - 12/10/19 0936    Visit Number 11    Number of Visits 16    Date for PT Re-Evaluation 12/31/19    Authorization Type [employer] -- Josem Kaufmann pending    PT Start Time [phone]    PT Stop Time 0930    PT Time Calculation (min) 38 min           Past Medical History:  Diagnosis Date  . Abnormal perimenopausal bleeding 2005  . Allergy   . Anemia   . Anxiety   . Dysuria 2004  . Endometrial polyp 2006  . Environmental allergies   . Fibroid 02/19/03  . H/O varicella   . History of measles, mumps, or rubella   . Hx of colonic polyps 12/15/2010  . Hypertension 07-19-2010   echo for pre-op EF 55%  normal left wall thickness LA mildly dialated with mitral and tricuspid regurgatation  . Hypothyroidism   . Interstitial cystitis   . Menopausal symptoms 2004  . Obesity   . Osteopenia 02/2012   -1.8 T score right femur neck  . PMB (postmenopausal bleeding) 06/21/10  . Sleep apnea   . Urinary incontinence 2011  . Vitamin D deficiency    history of    Past Surgical History:  Procedure Laterality Date  . COLONOSCOPY  12/15/10  . HYSTEROSCOPY  2006 and May 2012   for fibroids, endometrial polyps  . MOUTH SURGERY    . MYOMECTOMY  1974    There were no vitals filed for this visit.   Subjective Assessment - 12/10/19 0856    Subjective Hurts too much to squat. I have pain sometimes when I bend my right knee.    Currently in Pain? Yes    Pain Location Knee    Pain Orientation Right    Pain Descriptors / Indicators Aching    Pain Type Chronic pain    Aggravating Factors  bending, squatting    Pain Relieving Factors avoid  squats and bends              [facility] PT Assessment - 12/10/19 0001      Strength   Right Hip Flexion 4/5    Right Hip Extension 4+/5    Right Hip ABduction 4+/5    Left Hip Flexion 4/5    Left Hip Extension 4+/5    Left Hip ABduction 4+/5    Right Knee Flexion --   Hamstring curl 45# DL   Right Knee Extension --   Knee extension machine 20# DL   Right Ankle Plantar Flexion 4+/5    Left Ankle Plantar Flexion 4+/5                         [facility] Adult PT Treatment/Exercise - 12/10/19 0001      Self-Care   Self-Care Other Self-Care Comments    Other Self-Care Comments  continuation of HEP for strengtheing, attemtping bilateral closed chain therex and small step for strengthening without too much pain, apply ice after closed chain HEP      Knee/Hip Exercises: Standing   Lateral Step Up Limitations painful    Forward Step Up  Left;2 sets;10 reps;Step Height: 6"    Forward Step Up Limitations less pain with 4 inch so added to HEP     Step Down Limitations painful , small bend in knee     Other Standing Knee Exercises deadlift (butt to wall for cueing) 2x 10 with 5#      Knee/Hip Exercises: Seated   Sit to Sand 10 reps;2 sets   green band around knees      Cryotherapy   Number Minutes Cryotherapy 8 Minutes   during self care, HEP explination   Cryotherapy Location Knee    Type of Cryotherapy Ice pack                    PT Short Term Goals - 12/10/19 0925      PT SHORT TERM GOAL #1   Title Pt will be independent with initial HEP    Time 4    Period Weeks    Status Achieved    Target Date 09/09/19      PT SHORT TERM GOAL #2   Title Pt will improve bilat hip strength to at least 4/5    Baseline 3+/5 hip flexion and abduction, 4-/5 hip extension bilaterally    Time 4    Period Weeks    Status Not Met      PT SHORT TERM GOAL #3   Title Pt will have improved SLS to at least 8 sec bilaterally for improved step/stair negotiation    Baseline 4 sec  on L, 3 sec on R    Time 4    Period Weeks    Status Achieved    Target Date 09/09/19      PT SHORT TERM GOAL #4   Title Pt will improve stance time on foam with feet together to at least 20 sec with no LOB    Baseline 6 sec before LOB    Time 4    Period Weeks    Status Achieved             PT Long Term Goals - 12/10/19 7510      PT LONG TERM GOAL #1   Title Pt will be independent with HEP    Time 8    Period Weeks    Status Achieved      PT LONG TERM GOAL #2   Title Pt will improve bilat hip and knee strength to 5/5 in all directions    Time 4    Period Weeks    Status Revised      PT LONG TERM GOAL #3   Title Pt will be able to negotiate 5 steps without use of HR    Baseline Unable    Time 4    Period Weeks    Status Not Met      PT LONG TERM GOAL #4   Title Pt will improve SLS to 10 sec bilaterally    Time 8    Period Weeks    Status Achieved      PT LONG TERM GOAL #5   Title Pt will be able to safely come off 5" curb without LOB    Baseline Feels okay with small curb    Time 6    Period Weeks    Status Achieved                 Plan - 12/10/19 0929    Clinical Impression Statement Pt arrives requesting Discharge to HEP. Reviewed  closed chain strength and added 4inch stpe ups at sink since she is unable to step down from any height at this time without pain. Also asked that she focus sit-stands and deadlifts to strengthen in bilateral closed chain position for decreased pain. She plans to continue strengthening HEP and may return in the future. No change since last Re-evaluation. See goals.    PT Next Visit Plan discharge to HEP today    PT Home Exercise Plan Access Code [email or web address]           PHYSICAL THERAPY DISCHARGE SUMMARY  Visits from Start of Care: 11   Current functional level related to goals / functional outcomes: See above   Remaining deficits: Unable to step down with L LE first due to increased R knee pain with flexion;  otherwise, able to perform safe curb negotiation stepping off with R LE first   Education / Equipment: See above Plan: Patient agrees to discharge.  Patient goals were partially met. Patient is being discharged due to being pleased with the current functional level.  ?????       Patient will benefit from skilled therapeutic intervention in order to improve the following deficits and impairments:  Abnormal gait, Difficulty walking, Decreased balance, Improper body mechanics, Decreased mobility, Decreased strength, Increased edema, Postural dysfunction, Hypomobility, Decreased activity tolerance  Visit Diagnosis: Repeated falls  History of falling  Muscle weakness (generalized)  Stiffness of left knee, not elsewhere classified  Stiffness of right knee, not elsewhere classified     Problem List Patient Active Problem List   Diagnosis Date Noted  . Cyst of skin 10/02/2018  . Obstructive sleep apnea treated with continuous positive airway pressure (CPAP) 07/02/2017  . Osteopenia 09/01/2016  . Urinary incontinence in female 09/01/2016  . Vitamin D deficiency 09/01/2016  . BMI 35.0-35.9,adult 11/16/2015  . Essential hypertension, benign 04/28/2012  . Osteoarthritis of left knee 04/28/2012  . Hypothyroidism 04/28/2012  . Hx of colonic polyps 12/15/2010  . Anxiety 11/17/2010    Dorene Ar, PTA 12/10/2019, 9:43 AM  Estill Bamberg April Gordy Levan, PT, DPT  [facility] [address] [place], [place], [address] Phone: [phone]   Fax:  [phone]  Name: [name] MRN: [id number] Date of Birth: 1945/06/21

## 2019-12-10 NOTE — Patient Instructions (Signed)
Access Code: [email or web address] URL: [email or web address] Date: 12/10/2019 Prepared by: Hessie Diener  Exercises Hip Abduction with Resistance Loop - 1 x daily - 3 x weekly - 2 sets - 10 reps Hip Extension with Resistance Loop - 1 x daily - 3 x weekly - 2 sets - 10 reps Heel rises with counter support - 1 x daily - 3 x weekly - 2 sets - 10 reps Tandem Stance with Support - 1 x daily - 3 x weekly - 2 sets - 30 sec hold Tandem Stance with Head Rotation - 1 x daily - 3 x weekly - 2 sets - 30 sec hold Standing Gastroc Stretch on Step - 1 x daily - 7 x weekly - 3 sets - 30 sec hold Seated Ankle Inversion Eversion PROM - 1 x daily - 7 x weekly - 3 sets - 30 sec hold Kettlebell Deadlift - 1 x daily - 7 x weekly - 3 sets - 10 reps Lateral Step Down - 1 x daily - 7 x weekly - 2 sets - 10 reps Forward Step Down - 1 x daily - 7 x weekly - 2 sets - 10 reps  New exercises:  Sit to Stand - 2 x daily - 7 x weekly - 2 sets - 10 reps Forward Step Up with Counter Support - 2 x daily - 7 x weekly - 10 reps

## 2019-12-11 ENCOUNTER — Ambulatory Visit: Payer: Medicare PPO | Admitting: Physical Therapy

## 2019-12-15 ENCOUNTER — Other Ambulatory Visit: Payer: Self-pay | Admitting: Family Medicine

## 2019-12-15 ENCOUNTER — Telehealth: Payer: Self-pay | Admitting: Family Medicine

## 2019-12-15 ENCOUNTER — Other Ambulatory Visit: Payer: Self-pay | Admitting: Internal Medicine

## 2019-12-15 ENCOUNTER — Other Ambulatory Visit: Payer: Self-pay

## 2019-12-15 DIAGNOSIS — I10 Essential (primary) hypertension: Secondary | ICD-10-CM

## 2019-12-15 MED ORDER — BENAZEPRIL-HYDROCHLOROTHIAZIDE 20-12.5 MG PO TABS: 2.0000 | ORAL_TABLET | Freq: Every day | ORAL | 1 refills | Status: DC

## 2019-12-15 NOTE — Telephone Encounter (Signed)
error:[id number] ° °

## 2019-12-15 NOTE — Telephone Encounter (Signed)
Requested Prescriptions  Pending Prescriptions Disp Refills  . benazepril-hydrochlorthiazide (LOTENSIN HCT) 20-12.5 MG tablet [Pharmacy Med Name: BENAZEPRIL/HCTZ 20/12.5MG  TABLETS] 180 tablet 1    Sig: TAKE 2 TABLETS BY MOUTH DAILY     Cardiovascular:  ACEI + Diuretic Combos Failed - 12/15/2019  7:03 AM      Failed - Last BP in normal range    BP Readings from Last 1 Encounters:  10/22/19 (!) 166/70         Passed - Na in normal range and within 180 days    Sodium  Date Value Ref Range Status  06/23/2019 144 134 - 144 mmol/L Final         Passed - K in normal range and within 180 days    Potassium  Date Value Ref Range Status  06/23/2019 3.8 3.5 - 5.2 mmol/L Final         Passed - Cr in normal range and within 180 days    Creat  Date Value Ref Range Status  11/13/2015 0.70 0.60 - 0.93 mg/dL Final    Comment:      For patients > or = 75 years of age: The upper reference limit for Creatinine is approximately 13% higher for people identified as African-American.      Creatinine, Ser  Date Value Ref Range Status  06/23/2019 0.70 0.57 - 1.00 mg/dL Final         Passed - Ca in normal range and within 180 days    Calcium  Date Value Ref Range Status  06/23/2019 9.5 8.7 - 10.3 mg/dL Final         Passed - Patient is not pregnant      Passed - Valid encounter within last 6 months    Recent Outpatient Visits          2 weeks ago Vertigo   [facility], Lilia Argue, MD   2 months ago Essential hypertension, benign   [facility], Lilia Argue, MD   3 months ago Medicare annual wellness visit, subsequent   [facility], Lilia Argue, MD   5 months ago Essential hypertension, benign   [facility], Lilia Argue, MD   11 months ago Need for prophylactic vaccination and inoculation against influenza   [facility], Lilia Argue, MD             . hydrALAZINE (APRESOLINE) 25 MG tablet [Pharmacy Med  Name: HYDRALAZINE  25MG  TABLETS(ORANGE)] 450 tablet 0    Sig: TAKE 2 TABLETS BY MOUTH EVERY MORNING, THEN 1 TABLET BY MOUTH AT LUNCH AND 2 TABLETS AT DINNER     Cardiovascular:  Vasodilators Failed - 12/15/2019  7:03 AM      Failed - Last BP in normal range    BP Readings from Last 1 Encounters:  10/22/19 (!) 166/70         Passed - HCT in normal range and within 360 days    Hematocrit  Date Value Ref Range Status  06/23/2019 42.6 34.0 - 46.6 % Final         Passed - HGB in normal range and within 360 days    Hemoglobin  Date Value Ref Range Status  06/23/2019 14.2 11.1 - 15.9 g/dL Final         Passed - RBC in normal range and within 360 days    RBC  Date Value Ref Range Status  06/23/2019  5.18 3.77 - 5.28 x10E6/uL Final  07/31/2017 5.44 (H) 3.87 - 5.11 MIL/uL Final         Passed - WBC in normal range and within 360 days    WBC  Date Value Ref Range Status  06/23/2019 8.5 3.4 - 10.8 x10E3/uL Final  07/31/2017 8.4 4.0 - 10.5 K/uL Final         Passed - PLT in normal range and within 360 days    Platelets  Date Value Ref Range Status  06/23/2019 266 150 - 450 x10E3/uL Final   Platelet Count, POC  Date Value Ref Range Status  03/15/2016 243 142 - 424 K/uL Final         Passed - Valid encounter within last 12 months    Recent Outpatient Visits          2 weeks ago Vertigo   [facility], Lilia Argue, MD   2 months ago Essential hypertension, benign   [facility], Lilia Argue, MD   3 months ago Medicare annual wellness visit, subsequent   [facility], Lilia Argue, MD   5 months ago Essential hypertension, benign   [facility], Lilia Argue, MD   11 months ago Need for prophylactic vaccination and inoculation against influenza   [facility], Lilia Argue, MD

## 2019-12-16 ENCOUNTER — Other Ambulatory Visit: Payer: Self-pay

## 2019-12-16 ENCOUNTER — Ambulatory Visit (INDEPENDENT_AMBULATORY_CARE_PROVIDER_SITE_OTHER): Payer: Medicare PPO | Admitting: Registered Nurse

## 2019-12-16 DIAGNOSIS — Z23 Encounter for immunization: Secondary | ICD-10-CM | POA: Diagnosis not present

## 2019-12-16 NOTE — Patient Instructions (Signed)
° ° ° °  If you have lab work done today you will be contacted with your lab results within the next 2 weeks.  If you have not heard from us then please contact us. The fastest way to get your results is to register for My Chart. ° ° °IF you received an x-ray today, you will receive an invoice from [facility]. Please contact [facility] at [phone] with questions or concerns regarding your invoice.  ° °IF you received labwork today, you will receive an invoice from [facility]. Please contact [facility] at [phone] with questions or concerns regarding your invoice.  ° °Our billing staff will not be able to assist you with questions regarding bills from these companies. ° °You will be contacted with the lab results as soon as they are available. The fastest way to get your results is to activate your My Chart account. Instructions are located on the last page of this paperwork. If you have not heard from us regarding the results in 2 weeks, please contact this office. °  ° ° ° °

## 2019-12-17 DIAGNOSIS — G4733 Obstructive sleep apnea (adult) (pediatric): Secondary | ICD-10-CM | POA: Diagnosis not present

## 2019-12-19 ENCOUNTER — Ambulatory Visit: Payer: Medicare PPO

## 2019-12-23 DIAGNOSIS — R351 Nocturia: Secondary | ICD-10-CM | POA: Diagnosis not present

## 2019-12-23 DIAGNOSIS — R350 Frequency of micturition: Secondary | ICD-10-CM | POA: Diagnosis not present

## 2019-12-23 DIAGNOSIS — R3914 Feeling of incomplete bladder emptying: Secondary | ICD-10-CM | POA: Diagnosis not present

## 2019-12-23 DIAGNOSIS — N3946 Mixed incontinence: Secondary | ICD-10-CM | POA: Diagnosis not present

## 2019-12-25 ENCOUNTER — Ambulatory Visit (INDEPENDENT_AMBULATORY_CARE_PROVIDER_SITE_OTHER): Payer: Medicare PPO | Admitting: Psychology

## 2019-12-25 DIAGNOSIS — F319 Bipolar disorder, unspecified: Secondary | ICD-10-CM

## 2019-12-31 ENCOUNTER — Ambulatory Visit (INDEPENDENT_AMBULATORY_CARE_PROVIDER_SITE_OTHER): Payer: Medicare PPO | Admitting: Psychology

## 2019-12-31 DIAGNOSIS — F319 Bipolar disorder, unspecified: Secondary | ICD-10-CM

## 2020-01-02 ENCOUNTER — Ambulatory Visit: Payer: Medicare PPO

## 2020-01-12 ENCOUNTER — Ambulatory Visit (INDEPENDENT_AMBULATORY_CARE_PROVIDER_SITE_OTHER): Payer: Medicare PPO | Admitting: Psychology

## 2020-01-12 DIAGNOSIS — F319 Bipolar disorder, unspecified: Secondary | ICD-10-CM

## 2020-01-19 ENCOUNTER — Ambulatory Visit (INDEPENDENT_AMBULATORY_CARE_PROVIDER_SITE_OTHER): Payer: Medicare PPO | Admitting: Psychology

## 2020-01-19 DIAGNOSIS — F411 Generalized anxiety disorder: Secondary | ICD-10-CM

## 2020-01-26 ENCOUNTER — Ambulatory Visit (INDEPENDENT_AMBULATORY_CARE_PROVIDER_SITE_OTHER): Payer: Medicare PPO | Admitting: Psychology

## 2020-01-26 DIAGNOSIS — F319 Bipolar disorder, unspecified: Secondary | ICD-10-CM

## 2020-02-02 ENCOUNTER — Ambulatory Visit (INDEPENDENT_AMBULATORY_CARE_PROVIDER_SITE_OTHER): Payer: Medicare PPO | Admitting: Psychology

## 2020-02-02 DIAGNOSIS — F319 Bipolar disorder, unspecified: Secondary | ICD-10-CM

## 2020-02-09 ENCOUNTER — Ambulatory Visit: Payer: Medicare PPO | Admitting: Psychology

## 2020-02-16 ENCOUNTER — Ambulatory Visit (INDEPENDENT_AMBULATORY_CARE_PROVIDER_SITE_OTHER): Payer: Medicare PPO | Admitting: Psychology

## 2020-02-16 DIAGNOSIS — F411 Generalized anxiety disorder: Secondary | ICD-10-CM | POA: Diagnosis not present

## 2020-02-24 ENCOUNTER — Ambulatory Visit (INDEPENDENT_AMBULATORY_CARE_PROVIDER_SITE_OTHER): Payer: Medicare PPO | Admitting: Psychology

## 2020-02-24 DIAGNOSIS — F411 Generalized anxiety disorder: Secondary | ICD-10-CM

## 2020-03-01 ENCOUNTER — Ambulatory Visit (INDEPENDENT_AMBULATORY_CARE_PROVIDER_SITE_OTHER): Payer: Medicare PPO | Admitting: Psychology

## 2020-03-01 DIAGNOSIS — F319 Bipolar disorder, unspecified: Secondary | ICD-10-CM | POA: Diagnosis not present

## 2020-03-01 DIAGNOSIS — F411 Generalized anxiety disorder: Secondary | ICD-10-CM

## 2020-03-08 ENCOUNTER — Ambulatory Visit (INDEPENDENT_AMBULATORY_CARE_PROVIDER_SITE_OTHER): Payer: Medicare PPO | Admitting: Psychology

## 2020-03-08 DIAGNOSIS — F411 Generalized anxiety disorder: Secondary | ICD-10-CM

## 2020-03-14 ENCOUNTER — Encounter: Payer: Self-pay | Admitting: Physical Therapy

## 2020-03-15 ENCOUNTER — Other Ambulatory Visit: Payer: Self-pay | Admitting: Family Medicine

## 2020-03-15 DIAGNOSIS — I10 Essential (primary) hypertension: Secondary | ICD-10-CM

## 2020-03-15 MED ORDER — METOPROLOL SUCCINATE ER 25 MG PO TB24
ORAL_TABLET | ORAL | 3 refills | Status: AC
Start: 2020-03-15 — End: ?

## 2020-03-15 MED ORDER — HYDRALAZINE HCL 25 MG PO TABS: ORAL_TABLET | ORAL | 3 refills | Status: DC

## 2020-03-16 ENCOUNTER — Ambulatory Visit (INDEPENDENT_AMBULATORY_CARE_PROVIDER_SITE_OTHER): Payer: Medicare PPO | Admitting: Psychology

## 2020-03-16 DIAGNOSIS — F411 Generalized anxiety disorder: Secondary | ICD-10-CM

## 2020-03-29 ENCOUNTER — Ambulatory Visit (INDEPENDENT_AMBULATORY_CARE_PROVIDER_SITE_OTHER): Payer: Medicare PPO | Admitting: Psychology

## 2020-03-29 DIAGNOSIS — F411 Generalized anxiety disorder: Secondary | ICD-10-CM | POA: Diagnosis not present

## 2020-04-05 ENCOUNTER — Ambulatory Visit (INDEPENDENT_AMBULATORY_CARE_PROVIDER_SITE_OTHER): Payer: Medicare PPO | Admitting: Psychology

## 2020-04-05 DIAGNOSIS — F411 Generalized anxiety disorder: Secondary | ICD-10-CM | POA: Diagnosis not present

## 2020-04-12 ENCOUNTER — Ambulatory Visit (INDEPENDENT_AMBULATORY_CARE_PROVIDER_SITE_OTHER): Payer: Medicare PPO | Admitting: Psychology

## 2020-04-12 DIAGNOSIS — F411 Generalized anxiety disorder: Secondary | ICD-10-CM

## 2020-04-19 ENCOUNTER — Ambulatory Visit (INDEPENDENT_AMBULATORY_CARE_PROVIDER_SITE_OTHER): Payer: Medicare PPO | Admitting: Psychology

## 2020-04-19 DIAGNOSIS — F411 Generalized anxiety disorder: Secondary | ICD-10-CM | POA: Diagnosis not present

## 2020-05-07 ENCOUNTER — Telehealth: Payer: Self-pay | Admitting: Family Medicine

## 2020-05-07 DIAGNOSIS — I10 Essential (primary) hypertension: Secondary | ICD-10-CM

## 2020-05-07 NOTE — Telephone Encounter (Signed)
Pt was advised about her refills sent to the pharmacy for the medications requested / Pt asked if one 90 day supply refill for benazepril-hydrochlorthiazide (LOTENSIN HCT) 20-12.5 MG tablet Can be sent to the pharmacy because that is the only one she will run out of while waiting for her appt with her new PCP that will be in august/ pt wanted to make sure she has enough meds to get her through that time due to this office closure/ please advise

## 2020-05-10 ENCOUNTER — Ambulatory Visit (INDEPENDENT_AMBULATORY_CARE_PROVIDER_SITE_OTHER): Payer: Medicare PPO | Admitting: Psychology

## 2020-05-10 ENCOUNTER — Other Ambulatory Visit: Payer: Self-pay | Admitting: Family Medicine

## 2020-05-10 DIAGNOSIS — F411 Generalized anxiety disorder: Secondary | ICD-10-CM | POA: Diagnosis not present

## 2020-05-10 DIAGNOSIS — I10 Essential (primary) hypertension: Secondary | ICD-10-CM

## 2020-05-10 MED ORDER — BENAZEPRIL-HYDROCHLOROTHIAZIDE 20-12.5 MG PO TABS
2.0000 | ORAL_TABLET | Freq: Every day | ORAL | 1 refills | Status: AC
Start: 2020-05-10 — End: ?

## 2020-05-11 ENCOUNTER — Encounter: Payer: Medicare PPO | Admitting: Family Medicine

## 2020-05-17 ENCOUNTER — Ambulatory Visit (INDEPENDENT_AMBULATORY_CARE_PROVIDER_SITE_OTHER): Payer: Medicare PPO | Admitting: Psychology

## 2020-05-17 DIAGNOSIS — F411 Generalized anxiety disorder: Secondary | ICD-10-CM | POA: Diagnosis not present

## 2020-05-24 ENCOUNTER — Ambulatory Visit (INDEPENDENT_AMBULATORY_CARE_PROVIDER_SITE_OTHER): Payer: Medicare PPO | Admitting: Psychology

## 2020-05-24 DIAGNOSIS — F411 Generalized anxiety disorder: Secondary | ICD-10-CM

## 2020-05-31 ENCOUNTER — Ambulatory Visit (INDEPENDENT_AMBULATORY_CARE_PROVIDER_SITE_OTHER): Payer: Medicare PPO | Admitting: Psychology

## 2020-05-31 DIAGNOSIS — F411 Generalized anxiety disorder: Secondary | ICD-10-CM | POA: Diagnosis not present

## 2020-06-09 ENCOUNTER — Ambulatory Visit: Payer: Medicare PPO | Admitting: Psychology

## 2020-06-14 ENCOUNTER — Ambulatory Visit: Payer: Medicare PPO | Admitting: Internal Medicine

## 2020-06-14 ENCOUNTER — Ambulatory Visit (INDEPENDENT_AMBULATORY_CARE_PROVIDER_SITE_OTHER): Payer: Medicare PPO | Admitting: Psychology

## 2020-06-14 DIAGNOSIS — F411 Generalized anxiety disorder: Secondary | ICD-10-CM

## 2020-07-01 ENCOUNTER — Ambulatory Visit (INDEPENDENT_AMBULATORY_CARE_PROVIDER_SITE_OTHER): Payer: Medicare PPO | Admitting: Psychology

## 2020-07-01 DIAGNOSIS — F411 Generalized anxiety disorder: Secondary | ICD-10-CM

## 2020-07-06 ENCOUNTER — Ambulatory Visit (INDEPENDENT_AMBULATORY_CARE_PROVIDER_SITE_OTHER): Payer: Medicare PPO | Admitting: Psychology

## 2020-07-06 DIAGNOSIS — F411 Generalized anxiety disorder: Secondary | ICD-10-CM

## 2020-07-13 ENCOUNTER — Ambulatory Visit (INDEPENDENT_AMBULATORY_CARE_PROVIDER_SITE_OTHER): Payer: Medicare PPO | Admitting: Psychology

## 2020-07-13 DIAGNOSIS — F411 Generalized anxiety disorder: Secondary | ICD-10-CM | POA: Diagnosis not present

## 2020-07-16 ENCOUNTER — Ambulatory Visit: Payer: Medicare PPO | Admitting: Internal Medicine

## 2020-07-16 ENCOUNTER — Encounter: Payer: Self-pay | Admitting: Internal Medicine

## 2020-07-16 ENCOUNTER — Other Ambulatory Visit: Payer: Self-pay

## 2020-07-16 VITALS — BP 160/110 | HR 56 | Ht 61.5 in | Wt 188.6 lb

## 2020-07-16 DIAGNOSIS — E039 Hypothyroidism, unspecified: Secondary | ICD-10-CM | POA: Diagnosis not present

## 2020-07-16 NOTE — Progress Notes (Signed)
Patient ID: [name], female   DOB: June 14, 1945, 75 y.o.   MRN: [id number]  This visit occurred during the SARS-CoV-2 public health emergency.  Safety protocols were in place, including screening questions prior to the visit, additional usage of staff PPE, and extensive cleaning of exam room while observing appropriate contact time as indicated for disinfecting solutions.   HPI  [name] is a 75 y.o.-year-old female, presenting for f/u for acquired hypothyroidism. Last visit 1.5 year ago. She will see Dr. Nancy Fetter.  Interim hx: She feels more tired, also gained weight. She has more imbalance. She continues to have palpitations occasionally. She also has insomnia and anxiety.  Reviewed hx: She has been diagnosed with hypothyroidism "many years" ago.  She continues on both liothyronine and levothyroxine.  She takes LT4 112 mcg and LT3 5 mcg daily (equivalent of 132 mcg of LT4): - in am - fasting - at least 1 hour from b'fast - no Fe, MVI, PPIs - no Calcium  - not on Biotin On vit C and D at night.  Reviewed latest TFTs: Lab Results  Component Value Date   TSH 1.880 06/23/2019   TSH 0.963 03/28/2018   TSH 1.240 08/14/2017   TSH 1.230 09/01/2016   TSH 1.75 11/13/2015   TSH 1.34 04/14/2015   TSH 3.549 02/12/2015   TSH 5.529 (H) 12/02/2014   TSH 2.161 05/30/2014   TSH 1.906 08/18/2013   FREET4 1.46 06/23/2019   FREET4 1.67 03/28/2018   FREET4 1.82 (H) 08/14/2017   FREET4 1.75 09/01/2016   FREET4 1.6 11/13/2015   FREET4 1.37 04/14/2015   FREET4 1.71 04/30/2013   FREET4 1.45 10/05/2012   FREET4 1.39 08/16/2012   She complains of poor memory when the TSH is close to the upper limit of normal.  She started to feel much better after the last increase in dose in 02/2015) remains normal after.    She continues to complain of insomnia, which is chronic for her.  Pt denies: - feeling nodules in neck - hoarseness - dysphagia - choking - SOB with lying down  She has  no FH of thyroid disorders. No FH of thyroid cancer. No h/o radiation tx to head or neck.  No herbal supplements. No Biotin use. No recent steroids use.   She has anxiety >> seeing a therapist. She uses a CPAP machine >> cough in am. She lives with her sister.  ROS: Constitutional: + weight gain/no weight loss, + fatigue 2/2 insomnia, no subjective hyperthermia, no subjective hypothermia Eyes: no blurry vision, no xerophthalmia ENT: no sore throat, + see HPI Cardiovascular: no CP/no SOB/no palpitations/no leg swelling Respiratory: + cough/no SOB/no wheezing Gastrointestinal: no N/no V/no D/no C/no acid reflux Musculoskeletal: no muscle aches/no joint aches Skin: no rashes, no hair loss Neurological: no tremors/no numbness/no tingling/no dizziness, feels she is losing balance  I reviewed pt's medications, allergies, PMH, social hx, family hx, and changes were documented in the history of present illness. Otherwise, unchanged from my initial visit note.  Past Medical History:  Diagnosis Date  . Abnormal perimenopausal bleeding 2005  . Allergy   . Anemia   . Anxiety   . Dysuria 2004  . Endometrial polyp 2006  . Environmental allergies   . Fibroid 02/19/03  . H/O varicella   . History of measles, mumps, or rubella   . Hx of colonic polyps 12/15/2010  . Hypertension 07-19-2010   echo for pre-op EF 55%  normal left wall thickness LA mildly dialated with  mitral and tricuspid regurgatation  . Hypothyroidism   . Interstitial cystitis   . Menopausal symptoms 2004  . Obesity   . Osteopenia 02/2012   -1.8 T score right femur neck  . PMB (postmenopausal bleeding) 06/21/10  . Sleep apnea   . Urinary incontinence 2011  . Vitamin D deficiency    history of   Past Surgical History:  Procedure Laterality Date  . COLONOSCOPY  12/15/10  . HYSTEROSCOPY  2006 and May 2012   for fibroids, endometrial polyps  . MOUTH SURGERY    . MYOMECTOMY  1974   Social History   Social History  .  Marital Status: Divorced    Spouse Name: N/A  . Number of Children: 0   Occupational History  . 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 Smoking status: Never Smoker   . Smokeless tobacco: Never Used  . Alcohol Use: 0.6 oz/week    1 Glasses of wine per week     Comment: occasional (1-2 times per week)  . Drug Use: No   Social History Narrative   Divorced. Education: [employer]. Exercise: Yoga 2 times a week.   Current Outpatient Medications on File Prior to Visit  Medication Sig Dispense Refill  . Ascorbic Acid (VITAMIN C) 1000 MG tablet Take 1,000 mg by mouth daily. One tablet a day    . benazepril-hydrochlorthiazide (LOTENSIN HCT) 20-12.5 MG tablet Take 2 tablets by mouth daily. 180 tablet 1  . cholecalciferol (VITAMIN D3) 25 MCG (1000 UNIT) tablet Take 2,000 Units by mouth daily.    . hydrALAZINE (APRESOLINE) 25 MG tablet TAKE 2 TABLETS BY MOUTH EVERY MORNING, THEN 1 TABLET BY MOUTH AT LUNCH AND 2 TABLETS AT DINNER 450 tablet 3  . levothyroxine (SYNTHROID) 112 MCG tablet TAKE 1 TABLET BY MOUTH EVERY DAY BEFORE BREAKFAST 90 tablet 3  . liothyronine (CYTOMEL) 5 MCG tablet TAKE 1 TABLET BY MOUTH EVERY DAY FOR THYROID 90 tablet 3  . metoprolol succinate (TOPROL-XL) 25 MG 24 hr tablet TAKE 1 TABLET(25 MG) BY MOUTH DAILY 90 tablet 3   No current facility-administered medications on file prior to visit.   Allergies  Allergen Reactions  . Bactrim [Sulfamethoxazole-Trimethoprim]   . Epinephrine     shakey  . Erythromycin   . Sulfa Antibiotics Rash   Family History  Problem Relation Age of Onset  . Kidney disease Father        from uremia  . Benign prostatic hyperplasia Father   . Hypertension Mother   . Colon cancer Paternal Uncle   . Clotting disorder Maternal Grandmother        ????   PE: BP (!) 160/110 (BP Location: Left Arm, Patient Position: Sitting, Cuff Size: Normal)   Pulse (!) 56   Ht 5' 1.5" (1.562 m)   Wt 188 lb 9.6 oz (85.5 kg)    SpO2 95%   BMI 35.06 kg/m  Wt Readings from Last 3 Encounters:  07/16/20 188 lb 9.6 oz (85.5 kg)  10/07/19 184 lb (83.5 kg)  09/04/19 182 lb (82.6 kg)   Constitutional: overweight, in NAD Eyes: PERRLA, EOMI, no exophthalmos ENT: moist mucous membranes, no thyromegaly, no cervical lymphadenopathy Cardiovascular: RRR, No MRG Respiratory: CTA B Gastrointestinal: abdomen soft, NT, ND, BS+ Musculoskeletal: no deformities, strength intact in all 4 Skin: moist, warm, no rashes Neurological: no tremor with outstretched hands, DTR normal in all 4  ASSESSMENT: 1. Hypothyroidism  PLAN:  1. Patient with longstanding hypothyroidism,  on levothyroxine and liothyronine therapy.  She complains of memory loss with TSH in the upper range of the normal interval, but at today's visit. - latest thyroid labs reviewed with pt >> normal: Lab Results  Component Value Date   TSH 1.880 06/23/2019   - she continues on LT4 112 and LT3 5 mcg daily (stable in dose of 132 mcg LT4 daily) - pt feels good on this dose except wt gain since last in person visit in 01/2018: 16 lbs. She also complains of fatigue, anxiety, occasional palpitations (not new), problems with balance.  We discussed that these are not likely to be thyroid symptoms, but we definitely need to reevaluate her thyroid function. - we discussed about taking the thyroid hormone every day, with water, >30 minutes before breakfast, separated by >4 hours from acid reflux medications, calcium, iron, multivitamins. Pt. is taking it correctly. - will check thyroid tests today: TSH, free T3 and fT4 - If labs are abnormal, she will need to return for repeat TFTs in 1.5 months -I will see her back in a year  Needs refills. Component     Latest Ref Rng & Units 07/16/2020  TSH     0.450 - 4.500 uIU/mL 1.210  Triiodothyronine,Free,Serum     2.0 - 4.4 pg/mL 3.1  T4,Free(Direct)     0.82 - 1.77 ng/dL 1.71  Normal TFTs.  [name], MD PhD [facility]  Endocrinology

## 2020-07-16 NOTE — Patient Instructions (Signed)
Please continue  Levothyroxine 112 mcg and Liothyronine 5 mcg daily.  Take the thyroid hormone every day, with water, at least 30 minutes before breakfast, separated by at least 4 hours from: - acid reflux medications - calcium - iron - multivitamins  Please come back for labs as soon as safe.  Please return in 1 year.

## 2020-07-17 LAB — TSH: TSH: 1.21 u[IU]/mL (ref 0.450–4.500)

## 2020-07-17 LAB — T4, FREE: Free T4: 1.71 ng/dL (ref 0.82–1.77)

## 2020-07-17 LAB — T3, FREE: T3, Free: 3.1 pg/mL (ref 2.0–4.4)

## 2020-07-19 MED ORDER — LIOTHYRONINE SODIUM 5 MCG PO TABS: ORAL_TABLET | ORAL | 3 refills | Status: DC

## 2020-07-19 MED ORDER — LEVOTHYROXINE SODIUM 112 MCG PO TABS: ORAL_TABLET | ORAL | 3 refills | Status: DC

## 2020-07-20 ENCOUNTER — Ambulatory Visit (INDEPENDENT_AMBULATORY_CARE_PROVIDER_SITE_OTHER): Payer: Medicare PPO | Admitting: Psychology

## 2020-07-20 DIAGNOSIS — F411 Generalized anxiety disorder: Secondary | ICD-10-CM

## 2020-07-27 ENCOUNTER — Ambulatory Visit (INDEPENDENT_AMBULATORY_CARE_PROVIDER_SITE_OTHER): Payer: Medicare PPO | Admitting: Psychology

## 2020-07-27 DIAGNOSIS — F411 Generalized anxiety disorder: Secondary | ICD-10-CM

## 2020-08-03 ENCOUNTER — Ambulatory Visit (INDEPENDENT_AMBULATORY_CARE_PROVIDER_SITE_OTHER): Payer: Medicare PPO | Admitting: Psychology

## 2020-08-03 DIAGNOSIS — F411 Generalized anxiety disorder: Secondary | ICD-10-CM

## 2020-08-10 ENCOUNTER — Ambulatory Visit (INDEPENDENT_AMBULATORY_CARE_PROVIDER_SITE_OTHER): Payer: Medicare PPO | Admitting: Psychology

## 2020-08-10 DIAGNOSIS — F411 Generalized anxiety disorder: Secondary | ICD-10-CM

## 2020-08-17 ENCOUNTER — Ambulatory Visit (INDEPENDENT_AMBULATORY_CARE_PROVIDER_SITE_OTHER): Payer: Medicare PPO | Admitting: Psychology

## 2020-08-17 DIAGNOSIS — F411 Generalized anxiety disorder: Secondary | ICD-10-CM | POA: Diagnosis not present

## 2020-08-24 ENCOUNTER — Ambulatory Visit (INDEPENDENT_AMBULATORY_CARE_PROVIDER_SITE_OTHER): Payer: Medicare PPO | Admitting: Psychology

## 2020-08-24 DIAGNOSIS — F411 Generalized anxiety disorder: Secondary | ICD-10-CM | POA: Diagnosis not present

## 2020-08-31 ENCOUNTER — Ambulatory Visit (INDEPENDENT_AMBULATORY_CARE_PROVIDER_SITE_OTHER): Payer: Medicare PPO | Admitting: Psychology

## 2020-08-31 DIAGNOSIS — F411 Generalized anxiety disorder: Secondary | ICD-10-CM | POA: Diagnosis not present

## 2020-09-20 ENCOUNTER — Ambulatory Visit (INDEPENDENT_AMBULATORY_CARE_PROVIDER_SITE_OTHER): Payer: Medicare PPO | Admitting: Psychology

## 2020-09-20 DIAGNOSIS — F411 Generalized anxiety disorder: Secondary | ICD-10-CM

## 2020-09-28 ENCOUNTER — Ambulatory Visit (INDEPENDENT_AMBULATORY_CARE_PROVIDER_SITE_OTHER): Payer: Medicare PPO | Admitting: Psychology

## 2020-09-28 DIAGNOSIS — F411 Generalized anxiety disorder: Secondary | ICD-10-CM | POA: Diagnosis not present

## 2020-10-05 ENCOUNTER — Ambulatory Visit (INDEPENDENT_AMBULATORY_CARE_PROVIDER_SITE_OTHER): Payer: Medicare PPO | Admitting: Psychology

## 2020-10-05 DIAGNOSIS — F411 Generalized anxiety disorder: Secondary | ICD-10-CM

## 2020-10-13 ENCOUNTER — Ambulatory Visit (INDEPENDENT_AMBULATORY_CARE_PROVIDER_SITE_OTHER): Payer: Medicare PPO | Admitting: Psychology

## 2020-10-13 DIAGNOSIS — F411 Generalized anxiety disorder: Secondary | ICD-10-CM

## 2020-10-21 ENCOUNTER — Other Ambulatory Visit: Payer: Self-pay | Admitting: Family Medicine

## 2020-10-21 ENCOUNTER — Ambulatory Visit (INDEPENDENT_AMBULATORY_CARE_PROVIDER_SITE_OTHER): Payer: Medicare PPO | Admitting: Psychology

## 2020-10-21 DIAGNOSIS — M8580 Other specified disorders of bone density and structure, unspecified site: Secondary | ICD-10-CM

## 2020-10-21 DIAGNOSIS — F411 Generalized anxiety disorder: Secondary | ICD-10-CM

## 2020-10-28 ENCOUNTER — Ambulatory Visit (INDEPENDENT_AMBULATORY_CARE_PROVIDER_SITE_OTHER): Payer: Medicare PPO | Admitting: Psychology

## 2020-10-28 DIAGNOSIS — F411 Generalized anxiety disorder: Secondary | ICD-10-CM | POA: Diagnosis not present

## 2020-11-22 ENCOUNTER — Ambulatory Visit (INDEPENDENT_AMBULATORY_CARE_PROVIDER_SITE_OTHER): Payer: Medicare PPO | Admitting: Psychology

## 2020-11-22 DIAGNOSIS — F411 Generalized anxiety disorder: Secondary | ICD-10-CM

## 2020-11-29 ENCOUNTER — Ambulatory Visit: Payer: Medicare PPO | Admitting: Psychology

## 2020-12-07 DIAGNOSIS — L538 Other specified erythematous conditions: Secondary | ICD-10-CM | POA: Diagnosis not present

## 2020-12-07 DIAGNOSIS — D225 Melanocytic nevi of trunk: Secondary | ICD-10-CM | POA: Diagnosis not present

## 2020-12-07 DIAGNOSIS — L02214 Cutaneous abscess of groin: Secondary | ICD-10-CM | POA: Diagnosis not present

## 2020-12-07 DIAGNOSIS — L814 Other melanin hyperpigmentation: Secondary | ICD-10-CM | POA: Diagnosis not present

## 2020-12-07 DIAGNOSIS — L718 Other rosacea: Secondary | ICD-10-CM | POA: Diagnosis not present

## 2020-12-07 DIAGNOSIS — R208 Other disturbances of skin sensation: Secondary | ICD-10-CM | POA: Diagnosis not present

## 2020-12-07 DIAGNOSIS — L821 Other seborrheic keratosis: Secondary | ICD-10-CM | POA: Diagnosis not present

## 2020-12-09 ENCOUNTER — Ambulatory Visit (INDEPENDENT_AMBULATORY_CARE_PROVIDER_SITE_OTHER): Payer: Medicare PPO | Admitting: Psychology

## 2020-12-09 DIAGNOSIS — F411 Generalized anxiety disorder: Secondary | ICD-10-CM

## 2020-12-15 ENCOUNTER — Ambulatory Visit (INDEPENDENT_AMBULATORY_CARE_PROVIDER_SITE_OTHER): Payer: Medicare PPO | Admitting: Psychology

## 2020-12-15 DIAGNOSIS — F411 Generalized anxiety disorder: Secondary | ICD-10-CM | POA: Diagnosis not present

## 2020-12-23 ENCOUNTER — Ambulatory Visit (INDEPENDENT_AMBULATORY_CARE_PROVIDER_SITE_OTHER): Payer: Medicare PPO | Admitting: Psychology

## 2020-12-23 DIAGNOSIS — F411 Generalized anxiety disorder: Secondary | ICD-10-CM | POA: Diagnosis not present

## 2020-12-29 ENCOUNTER — Ambulatory Visit (INDEPENDENT_AMBULATORY_CARE_PROVIDER_SITE_OTHER): Payer: Medicare PPO | Admitting: Psychology

## 2020-12-29 DIAGNOSIS — F411 Generalized anxiety disorder: Secondary | ICD-10-CM

## 2021-01-03 ENCOUNTER — Ambulatory Visit (INDEPENDENT_AMBULATORY_CARE_PROVIDER_SITE_OTHER): Payer: Medicare PPO | Admitting: Psychology

## 2021-01-03 DIAGNOSIS — F411 Generalized anxiety disorder: Secondary | ICD-10-CM

## 2021-01-12 ENCOUNTER — Ambulatory Visit (INDEPENDENT_AMBULATORY_CARE_PROVIDER_SITE_OTHER): Payer: Medicare PPO | Admitting: Psychology

## 2021-01-12 DIAGNOSIS — F411 Generalized anxiety disorder: Secondary | ICD-10-CM | POA: Diagnosis not present

## 2021-01-17 ENCOUNTER — Ambulatory Visit (INDEPENDENT_AMBULATORY_CARE_PROVIDER_SITE_OTHER): Payer: Medicare PPO | Admitting: Psychology

## 2021-01-17 DIAGNOSIS — F411 Generalized anxiety disorder: Secondary | ICD-10-CM | POA: Diagnosis not present

## 2021-01-26 ENCOUNTER — Ambulatory Visit: Payer: Medicare PPO | Admitting: Psychology

## 2021-01-31 DIAGNOSIS — H6123 Impacted cerumen, bilateral: Secondary | ICD-10-CM | POA: Diagnosis not present

## 2021-01-31 DIAGNOSIS — R42 Dizziness and giddiness: Secondary | ICD-10-CM | POA: Diagnosis not present

## 2021-01-31 DIAGNOSIS — H903 Sensorineural hearing loss, bilateral: Secondary | ICD-10-CM | POA: Diagnosis not present

## 2021-02-01 ENCOUNTER — Ambulatory Visit (INDEPENDENT_AMBULATORY_CARE_PROVIDER_SITE_OTHER): Payer: Medicare PPO | Admitting: Psychology

## 2021-02-01 DIAGNOSIS — F411 Generalized anxiety disorder: Secondary | ICD-10-CM | POA: Diagnosis not present

## 2021-02-08 DIAGNOSIS — M25561 Pain in right knee: Secondary | ICD-10-CM | POA: Diagnosis not present

## 2021-02-08 DIAGNOSIS — N329 Bladder disorder, unspecified: Secondary | ICD-10-CM | POA: Diagnosis not present

## 2021-02-08 DIAGNOSIS — M25562 Pain in left knee: Secondary | ICD-10-CM | POA: Diagnosis not present

## 2021-02-18 ENCOUNTER — Ambulatory Visit (INDEPENDENT_AMBULATORY_CARE_PROVIDER_SITE_OTHER): Payer: Medicare PPO | Admitting: Psychology

## 2021-02-18 DIAGNOSIS — F411 Generalized anxiety disorder: Secondary | ICD-10-CM

## 2021-02-18 NOTE — Progress Notes (Signed)
Date: 02/18/2021  Diagnosis G47.00 (Insomnia disorder) [n/a]  300.02 (Generalized anxiety disorder) [n/a]  Symptoms Complains of difficulty remaining asleep. (Status: maintained) -- No Description Entered  Excessive and/or unrealistic worry that is difficult to control occurring more days than not for at least 6 months about a number of events or activities. (Status: maintained) -- No Description Entered  Medication Status compliance  Safety none  If Suicidal or Homicidal State Action Taken: unspecified  Current Risk: low Medications unspecified Objectives Related Problem: Resolve the core conflict that is the source of anxiety. Description: Describe situations, thoughts, feelings, and actions associated with anxieties and worries, their impact on functioning, and attempts to resolve them. Target Date: 2022-02-12 Frequency: Daily Modality: individual Progress: 60%  Related Problem: Resolve the core conflict that is the source of anxiety. Description: Learn and implement calming skills to reduce overall anxiety and manage anxiety symptoms. Target Date: 2022-02-12 Frequency: Daily Modality: individual Progress: 60%  Related Problem: Resolve the core conflict that is the source of anxiety. Description: Learn and implement problem-solving strategies for realistically addressing worries. Target Date: 2022-02-12 Frequency: Daily Modality: individual Progress: 60%  Related Problem: Resolve the core conflict that is the source of anxiety. Description: Maintain involvement in work, family, and social activities. Target Date: 2022-02-12 Frequency: Daily Modality: individual Progress: 50%  Related Problem: Restore restful sleep pattern. Description: Describe the history and details of sleep pattern. Target Date: 2021-03-15 Frequency: Daily Modality: individual Progress: 70%    Client Response full compliance  Service Location Location, [address] [place], [place]  [address]  Service Code cpt [id number]  Self-monitoring  Lifestyle change (exercise, nutrition)  Self care activities  Distress tolerance skill  Identify/label emotions  Identify automatic thoughts  Facilitate problem solving  Normalize/Reframe  Behavioral activation plan  Validate/empathize  Comments  Dx.: Generalized Anxiety (F41.1)  Meds: No psychotropics Goals: [name] is seeking to develop a more satisfying life-style that takes into account her current physical condition. Goal date 11-04-20. States that the current situation in the world (politics and Covid 19) is making her very anxious. She is "obsessed" with the news (social media) and remains in a constant state of stress. She wants to learn to be less reactive. Will initiate behaviorally oriented individual therapy to address symptoms. Goal date is 10-04-21. While patient has realized some improvement in level of anxiety, she still expresses considerable stress and anxiety. Continues to need better balance in life. Revised date is 12-23.  Patient agrees to a video Webex video session due to Covid 19. She is at home and I am at my home office.   [name] states that she called for additional appointments and was told I can only get her in to my schedule once every 2 weeks. She prefers and requests to meet weekly. She says she is getting better and wants to keep the momentum going.  She reports she is back to spending considerable time engaged with watching/listening to media. She continues to feel she needs to be socially withdrawn due to the pandemic. She feels less isolated because she has her sister with her. She will, on occasion, go out with her sister for a meal.She brings food home or eats outside to be more cautious. Joy Morel, PhD  Time: 11:35-12:25 50 minutes

## 2021-03-01 NOTE — Therapy (Incomplete)
OUTPATIENT PHYSICAL THERAPY LOWER EXTREMITY EVALUATION   Patient Name: [name] MRN: [id number] DOB:02/02/1946, 75 y.o., female Today's Date: 03/01/2021    Past Medical History:  Diagnosis Date   Abnormal perimenopausal bleeding 2005   Allergy    Anemia    Anxiety    Dysuria 2004   Endometrial polyp 2006   Environmental allergies    Fibroid 02/19/03   H/O varicella    History of measles, mumps, or rubella    Hx of colonic polyps 12/15/2010   Hypertension 07-19-2010   echo for pre-op EF 55%  normal left wall thickness LA mildly dialated with mitral and tricuspid regurgatation   Hypothyroidism    Interstitial cystitis    Menopausal symptoms 2004   Obesity    Osteopenia 02/2012   -1.8 T score right femur neck   PMB (postmenopausal bleeding) 06/21/10   Sleep apnea    Urinary incontinence 2011   Vitamin D deficiency    history of   Past Surgical History:  Procedure Laterality Date   COLONOSCOPY  12/15/10   HYSTEROSCOPY  2006 and May 2012   for fibroids, endometrial polyps   [place]   Patient Active Problem List   Diagnosis Date Noted   Cyst of skin 10/02/2018   Obstructive sleep apnea treated with continuous positive airway pressure (CPAP) 07/02/2017   Osteopenia 09/01/2016   Urinary incontinence in female 09/01/2016   Vitamin D deficiency 09/01/2016   BMI 35.0-35.9,adult 11/16/2015   Essential hypertension, benign 04/28/2012   Osteoarthritis of left knee 04/28/2012   Hypothyroidism 04/28/2012   Hx of colonic polyps 12/15/2010   Anxiety 11/17/2010    PCP: Jacelyn Pi, Lilia Argue, MD  REFERRING PROVIDER: Donald Prose, MD  REFERRING DIAG: M25.569 Pain in unspecified knee  THERAPY DIAG:  No diagnosis found.  ONSET DATE: ***  SUBJECTIVE:   SUBJECTIVE STATEMENT: ***  PERTINENT HISTORY: ***  PAIN:  Are you having pain? {yes/no:20286} VAS scale: ***/10 Pain location: *** Pain orientation: {Pain Orientation:25161}   PAIN TYPE: {type:313116} Pain description: {PAIN DESCRIPTION:21022940}  Aggravating factors: *** Relieving factors: ***  PRECAUTIONS: {Therapy precautions:24002}  WEIGHT BEARING RESTRICTIONS {Yes ***/No:24003}  FALLS:  Has patient fallen in last 6 months? {yes/no:20286}, Number of falls: ***  LIVING ENVIRONMENT: Lives with: {[facility] lives with:25569::"lives with their family"} Lives in: {Lives in:25570} Stairs: {yes/no:20286}; {Stairs:24000} Has following equipment at home: {Assistive devices:23999}  OCCUPATION: ***  PLOF: {PLOF:24004}  PATIENT GOALS ***   OBJECTIVE:   DIAGNOSTIC FINDINGS: none  PATIENT SURVEYS:  {rehab surveys:24030}  COGNITION:  Overall cognitive status: {cognition:24006}     SENSATION:  Light touch: {intact/deficits:24005}  Stereognosis: {intact/deficits:24005}  Hot/Cold: {intact/deficits:24005}  Proprioception: {intact/deficits:24005}  MUSCLE LENGTH: Hamstrings: Right *** deg; Left *** deg Thomas test: Right *** deg; Left *** deg  POSTURE:  ***  PALPATION: ***  LE AROM/PROM:  A/PROM Right 03/01/2021 Left 03/01/2021  Hip flexion    Hip extension    Hip abduction    Hip adduction    Hip internal rotation    Hip external rotation    Knee flexion    Knee extension    Ankle dorsiflexion    Ankle plantarflexion    Ankle inversion    Ankle eversion     (Blank rows = not tested)  LE MMT:  MMT Right 03/01/2021 Left 03/01/2021  Hip flexion    Hip extension    Hip abduction    Hip adduction    Hip internal  rotation    Hip external rotation    Knee flexion    Knee extension    Ankle dorsiflexion    Ankle plantarflexion    Ankle inversion    Ankle eversion     (Blank rows = not tested)  LOWER EXTREMITY SPECIAL TESTS:  {LEspecialtests:26242}  FUNCTIONAL TESTS:  {Functional tests:24029}  GAIT: Distance walked: *** Assistive device utilized: {Assistive devices:23999} Level of assistance: {Levels of  assistance:24026} Comments: ***    TODAY'S TREATMENT: ***   PATIENT EDUCATION:  Education details: *** Person educated: {Person educated:25204} Education method: {Education Method:25205} Education comprehension: {Education Comprehension:25206}   HOME EXERCISE PROGRAM: ***  ASSESSMENT:  CLINICAL IMPRESSION: Patient is a 75 y.o. female who was seen today for physical therapy evaluation and treatment for knee pain. Objective impairments include {opptimpairments:25111}. These impairments are limiting patient from {activity limitations:25113}. Personal factors including {Personal factors:25162} are also affecting patient's functional outcome. Patient will benefit from skilled PT to address above impairments and improve overall function.  REHAB POTENTIAL: {rehabpotential:25112}  CLINICAL DECISION MAKING: {clinical decision making:25114}  EVALUATION COMPLEXITY: {Evaluation complexity:25115}   GOALS: Goals reviewed with patient? {yes/no:20286}  SHORT TERM GOALS:  STG Name Target Date Goal status  1 *** Baseline:  {follow up:25551} {GOALSTATUS:25110}  2 *** Baseline:  {follow up:25551} {GOALSTATUS:25110}  3 *** Baseline: {follow up:25551} {GOALSTATUS:25110}  4 *** Baseline: {follow up:25551} {GOALSTATUS:25110}  5 *** Baseline: {follow up:25551} {GOALSTATUS:25110}  6 *** Baseline: {follow up:25551} {GOALSTATUS:25110}  7 *** Baseline: {follow up:25551} {GOALSTATUS:25110}   LONG TERM GOALS:   LTG Name Target Date Goal status  1 Pt has % function reported per FOTO Baseline: {follow up:25551} {GOALSTATUS:25110}  2 Pt will be indep with HEP for further pain management upon discharge Baseline: {follow up:25551} {GOALSTATUS:25110}  3 *** Baseline: {follow up:25551} {GOALSTATUS:25110}  4 *** Baseline: {follow up:25551} {GOALSTATUS:25110}  5 *** Baseline: {follow up:25551} {GOALSTATUS:25110}  6 *** Baseline: {follow up:25551} {GOALSTATUS:25110}  7 *** Baseline:  {follow up:25551} {GOALSTATUS:25110}   PLAN: PT FREQUENCY: {rehab frequency:25116}  PT DURATION: {rehab duration:25117}  PLANNED INTERVENTIONS: {rehab planned interventions:25118::"Therapeutic exercises","Therapeutic activity","Neuro Muscular re-education","Balance training","Gait training","Patient/Family education","Joint mobilization"}  PLAN FOR NEXT SESSION: ***   America Brown 03/01/2021, 9:41 AM

## 2021-03-03 ENCOUNTER — Ambulatory Visit: Payer: Medicare PPO | Admitting: Rehabilitative and Restorative Service Providers"

## 2021-03-11 ENCOUNTER — Encounter: Payer: Self-pay | Admitting: Physical Therapy

## 2021-03-11 ENCOUNTER — Ambulatory Visit: Payer: Medicare PPO | Attending: Family Medicine | Admitting: Physical Therapy

## 2021-03-11 ENCOUNTER — Other Ambulatory Visit: Payer: Self-pay

## 2021-03-11 DIAGNOSIS — M25661 Stiffness of right knee, not elsewhere classified: Secondary | ICD-10-CM | POA: Diagnosis not present

## 2021-03-11 DIAGNOSIS — M6281 Muscle weakness (generalized): Secondary | ICD-10-CM | POA: Diagnosis not present

## 2021-03-11 DIAGNOSIS — R296 Repeated falls: Secondary | ICD-10-CM | POA: Insufficient documentation

## 2021-03-11 DIAGNOSIS — Z9181 History of falling: Secondary | ICD-10-CM | POA: Diagnosis not present

## 2021-03-11 DIAGNOSIS — M25562 Pain in left knee: Secondary | ICD-10-CM | POA: Diagnosis not present

## 2021-03-11 DIAGNOSIS — M25561 Pain in right knee: Secondary | ICD-10-CM | POA: Insufficient documentation

## 2021-03-11 DIAGNOSIS — G8929 Other chronic pain: Secondary | ICD-10-CM | POA: Insufficient documentation

## 2021-03-11 DIAGNOSIS — M25662 Stiffness of left knee, not elsewhere classified: Secondary | ICD-10-CM | POA: Diagnosis not present

## 2021-03-11 NOTE — Therapy (Signed)
OUTPATIENT PHYSICAL THERAPY LOWER EXTREMITY EVALUATION   Patient Name: [name] MRN: [id number] DOB:02/22/1946, 76 y.o., female Today's Date: 03/11/2021   PT End of Session - 03/11/21 1242     Visit Number 1    Number of Visits 12    Date for PT Re-Evaluation 04/22/21    Authorization Type [employer]    PT Start Time 5102    PT Stop Time 1240    PT Time Calculation (min) 55 min    Activity Tolerance Patient limited by pain    Behavior During Therapy [facility] for tasks assessed/performed             Past Medical History:  Diagnosis Date   Abnormal perimenopausal bleeding 2005   Allergy    Anemia    Anxiety    Dysuria 2004   Endometrial polyp 2006   Environmental allergies    Fibroid 02/19/03   H/O varicella    History of measles, mumps, or rubella    Hx of colonic polyps 12/15/2010   Hypertension 07-19-2010   echo for pre-op EF 55%  normal left wall thickness LA mildly dialated with mitral and tricuspid regurgatation   Hypothyroidism    Interstitial cystitis    Menopausal symptoms 2004   Obesity    Osteopenia 02/2012   -1.8 T score right femur neck   PMB (postmenopausal bleeding) 06/21/10   Sleep apnea    Urinary incontinence 2011   Vitamin D deficiency    history of   Past Surgical History:  Procedure Laterality Date   COLONOSCOPY  12/15/10   HYSTEROSCOPY  2006 and May 2012   for fibroids, endometrial polyps   [place]   Patient Active Problem List   Diagnosis Date Noted   Cyst of skin 10/02/2018   Obstructive sleep apnea treated with continuous positive airway pressure (CPAP) 07/02/2017   Osteopenia 09/01/2016   Urinary incontinence in female 09/01/2016   Vitamin D deficiency 09/01/2016   BMI 35.0-35.9,adult 11/16/2015   Essential hypertension, benign 04/28/2012   Osteoarthritis of left knee 04/28/2012   Hypothyroidism 04/28/2012   Hx of colonic polyps 12/15/2010   Anxiety 11/17/2010    PCP: Jacelyn Pi,  Lilia Argue, MD  REFERRING PROVIDER: Donald Prose, MD  REFERRING DIAG: M25.569 (ICD-10-CM) - Pain in unspecified knee   THERAPY DIAG:  Bilateral knee pain , weakness, repeated falls   ONSET DATE: chronic , Sept. 2022, fall   SUBJECTIVE:   SUBJECTIVE STATEMENT: Patient presents for bilateral knee pain that is chronic.  She slipped in the kitchen in Sept. 2022 and twisted, Rt knee "banged up" (medial). It threw everything off.  I could walk for a day or so and it continued to hurt for weeks.  Urgent care found just arthritis.  Overall her pain is better but continues to have pain mostly when walking outer hips.  It is hard for me to get up from the toilet without help or even just chairs or the sofa.  She used to be able to get off the floor but she feels she can no longer do that.  Pain shoots down to her ankles.  I don't have as much strength as I used to have. I don't go out that much.    PERTINENT HISTORY: Patient was here for PT for falls in 2021 with some improved mobility.  She had knee pain at that time.  HTN, Osteopenia  PAIN:  Are you having pain? Yes  VAS scale: 7/10 Pain location: R and L  Pain orientation: Bilateral and Lateral  PAIN TYPE: aching Pain description: aching  Aggravating factors: transfers ,walking  Relieving factors: rest   PRECAUTIONS: None  WEIGHT BEARING RESTRICTIONS No  FALLS:  Has patient fallen in last 6 months? Yes, Number of falls: 1  LIVING ENVIRONMENT: Lives with: lives with their family Lives in: House/apartment Stairs: Yes; 5-6 External hold both rails. 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   PLOF: Independent with basic ADLs  PATIENT GOALS I want to become more active, rid of pain and be abel to take care of myself and fall, get stronger    OBJECTIVE:   DIAGNOSTIC FINDINGS: none  available recent states she has had one after her fall   PATIENT SURVEYS:  FOTO 36%  COGNITION:  Overall cognitive status: Within functional limits for tasks assessed     SENSATION:  Light touch: Appears intact  Stereognosis: Appears intact  Hot/Cold: Appears intact  Proprioception: Appears intact   POSTURE:  Stands with wide BOS, genu valgus  PALPATION: Tender to palpation medial knee R>L as well as laterally. Normal patella mobility.   LE AROM/PROM:  A/PROM Right 03/11/2021 Left 03/11/2021  Hip flexion    Hip extension    Hip abduction    Hip adduction    Hip internal rotation    Hip external rotation    Knee flexion 120 115  Knee extension 0 0  Ankle dorsiflexion    Ankle plantarflexion    Ankle inversion    Ankle eversion     (Blank rows = not tested)  LE MMT:  MMT Right 03/11/2021 Left 03/11/2021  Hip flexion 4/5 4/5  Hip extension    Hip abduction 4/5 NT due to pain, dizzy  Hip adduction    Hip internal rotation    Hip external rotation    Knee flexion 4/5 4/5  Knee extension 4/5 4/5  Ankle dorsiflexion 5/5 5/5   Ankle plantarflexion    Ankle inversion    Ankle eversion     (Blank rows = not tested)  LOWER EXTREMITY SPECIAL TESTS: NT  FUNCTIONAL TESTS:  5 times sit to stand: 11 sec , poor control an balance, painful  2 minute walk test: 372 feet , pain 5/10.     Pt with dyspnea, pain and anxiety    TODAY'S TREATMENT: PT/POC, HEP , eval findings and potential to improve . Hurt not harming herself.    PATIENT EDUCATION:  Education details: PT/POC, HEP Person educated: Patient Education method: Consulting civil engineer, Media planner, Verbal cues, and Handouts Education comprehension: verbalized understanding, returned demonstration, and needs further education   HOME EXERCISE PROGRAM: Access Code: [email or web address] URL: [email or web address] Date: 03/11/2021 Prepared by: Raeford Razor  Exercises Seated Long Arc Quad - 2 x daily - 7 x weekly - 2  sets - 10 reps - 5 hold Supine Active Straight Leg Raise - 2 x daily - 7 x weekly - 2 sets - 10 reps - 5 hold Seated Hamstring Stretch - 2 x daily - 7 x weekly - 1 sets - 3 reps - 30 hold Sidelying Hip Abduction - 2 x daily - 7 x weekly - 2 sets - 10 reps - 5 hold   ASSESSMENT:  CLINICAL IMPRESSION: Patient is a 77  y.o. female who was seen today for physical  therapy evaluation and treatment for bilateral knee pain. Objective impairments include Abnormal gait, decreased activity tolerance, decreased balance, decreased endurance, decreased mobility, difficulty walking, decreased ROM, decreased strength, increased fascial restrictions, impaired flexibility, improper body mechanics, postural dysfunction, and pain. These impairments are limiting patient from cleaning, community activity, meal prep, laundry, and shopping. Personal factors including Age, Time since onset of injury/illness/exacerbation, and 1-2 comorbidities: obesity, HTN, anxiety   are also affecting patient's functional outcome. Patient will benefit from skilled PT to address above impairments and improve overall function.  REHAB POTENTIAL: Excellent  CLINICAL DECISION MAKING: Stable/uncomplicated  EVALUATION COMPLEXITY: Low   GOALS: Goals reviewed with patient? No      LONG TERM GOALS:   LTG Name Target Date Goal status  1 Pt will be I with HEP for bilateral knee ROM and strength Baseline: 04/22/2021 INITIAL  2 Pt will be able to get up from sofa, chair and toilet without assist > 75% of the time, pain < 3/10 Baseline: 04/22/2021 INITIAL  3 Pt will be able to score   55% or better on FOTO to demo improved functional mobility  Baseline: 04/22/2021 INITIAL  4 Pt will be able to negotiate > 6 steps with only 1 rail, mod I in clinic and at sister's home  Baseline: 04/22/2021 INITIAL  5 Pt will be able to report less pain overall with household tasks, ADLs, 25% improved or more  Baseline: 04/22/2021 INITIAL              PLAN: PT FREQUENCY: 2 x per week   PT DURATION: 6 weeks  PLANNED INTERVENTIONS: Therapeutic exercises, Therapeutic activity, Neuro Muscular re-education, Balance training, Gait training, Patient/Family education, Joint mobilization, Dry Needling, Cryotherapy, Moist heat, Taping, Ionotophoresis 4mg /ml Dexamethasone, and Manual therapy  PLAN FOR NEXT SESSION: review HEP, nustep. Sit to stand ,begin strengthening ,knee AROM and closed chain asap   Yared Susan 03/11/2021, 12:44 PM    Referring diagnosis? Bilateral knee pain  Treatment diagnosis? (if different than referring diagnosis) bilateral knee pain , falls  What was this (referring dx) caused by? []  Surgery [x]  Fall [x]  Ongoing issue [x]  Arthritis []  Other: ____________  Laterality: []  Rt []  Lt [x]  Both  Check all possible CPT codes:  *CHOOSE 10 OR LESS*    []  97110 (Therapeutic Exercise)  []  92507 (SLP Treatment)  []  97112 (Neuro Re-ed)   []  92526 (Swallowing Treatment)   []  97116 (Gait Training)   []  [id number] (Cognitive Training, 1st 15 minutes) []  97140 (Manual Therapy)   []  97130 (Cognitive Training, each add'l 15 minutes)  []  97530 (Therapeutic Activities)  []  Other, List CPT Code ____________    []  35329 (Self Care)       [x]  All codes above (97110 - 97535)  []  97012 (Mechanical Traction)  []  97014 (E-stim Unattended)  []  92426 (E-stim manual)  []  97033 (Ionto)  []  97035 (Ultrasound)  []  97760 (Orthotic Fit) [x]  97750 (Physical Performance Training) []  [id number] (Aquatic Therapy) []  [id number] (Contrast Bath) []  [id number] (Paraffin) []  97597 (Wound Care 1st 20 sq cm) []  83419 (Wound Care each add'l 20 sq cm) []  97016 (Vasopneumatic Device) []  [id number] Comptroller) []  [id number] (Prosthetic Training)

## 2021-03-16 NOTE — Therapy (Signed)
OUTPATIENT PHYSICAL THERAPY TREATMENT NOTE   Patient Name: [name] MRN: [id number] DOB:[birth year], 76 y.o., female Today's Date: 03/21/2021  PCP: Jacelyn Pi, Lilia Argue, MD REFERRING PROVIDER: Jacelyn Pi, Lilia Argue, *   PT End of Session - 03/21/21 1630     Visit Number 3    Number of Visits 12    Date for PT Re-Evaluation 04/22/21    Authorization Type [employer]    PT Start Time 18    PT Stop Time 4132    PT Time Calculation (min) 42 min    Activity Tolerance Patient limited by pain    Behavior During Therapy [facility] for tasks assessed/performed             Past Medical History:  Diagnosis Date   Abnormal perimenopausal bleeding 2005   Allergy    Anemia    Anxiety    Dysuria 2004   Endometrial polyp 2006   Environmental allergies    Fibroid 02/19/03   H/O varicella    History of measles, mumps, or rubella    Hx of colonic polyps 12/15/2010   Hypertension 07-19-2010   echo for pre-op EF 55%  normal left wall thickness LA mildly dialated with mitral and tricuspid regurgatation   Hypothyroidism    Interstitial cystitis    Menopausal symptoms 2004   Obesity    Osteopenia 02/2012   -1.8 T score right femur neck   PMB (postmenopausal bleeding) 06/21/10   Sleep apnea    Urinary incontinence 2011   Vitamin D deficiency    history of   Past Surgical History:  Procedure Laterality Date   COLONOSCOPY  12/15/10   HYSTEROSCOPY  2006 and May 2012   for fibroids, endometrial polyps   [place]   Patient Active Problem List   Diagnosis Date Noted   Cyst of skin 10/02/2018   Obstructive sleep apnea treated with continuous positive airway pressure (CPAP) 07/02/2017   Osteopenia 09/01/2016   Urinary incontinence in female 09/01/2016   Vitamin D deficiency 09/01/2016   BMI 35.0-35.9,adult 11/16/2015   Essential hypertension, benign 04/28/2012   Osteoarthritis of left knee 04/28/2012   Hypothyroidism 04/28/2012   Hx of colonic  polyps 12/15/2010   Anxiety 11/17/2010    REFERRING DIAG: M25.569 (ICD-10-CM) - Pain in unspecified knee   THERAPY DIAG:  Chronic pain of left knee  Chronic pain of right knee  Repeated falls  Muscle weakness (generalized)  ONSET DATE: chronic , Sept. 2022, fall    SUBJECTIVE:    SUBJECTIVE STATEMENT:  Patient reports the knees are still bothering her. She is unsure how to do one of the stretches that she was prescribed for home.   PERTINENT HISTORY: Patient was here for PT for falls in 2021 with some improved mobility.  She had knee pain at that time.  HTN, Osteopenia   PAIN:  Are you having pain? Yes VAS scale: 5/10 Pain location: R and L  Pain orientation: Bilateral and Lateral  PAIN TYPE: aching Pain description: aching  Aggravating factors: transfers ,walking  Relieving factors: rest    PRECAUTIONS: None   WEIGHT BEARING RESTRICTIONS No  OBJECTIVE:   Unless otherwise noted all objective measures were performed on initial evaluation.* DIAGNOSTIC FINDINGS: none available recent states she has had one after her fall    PATIENT SURVEYS:  FOTO 36%   COGNITION:          Overall cognitive status: Within functional limits for  tasks assessed                        SENSATION:          Light touch: Appears intact          Stereognosis: Appears intact          Hot/Cold: Appears intact          Proprioception: Appears intact     POSTURE:  Stands with wide BOS, genu valgus   PALPATION: Tender to palpation medial knee R>L as well as laterally. Normal patella mobility.    LE AROM/PROM:   A/PROM Right 03/11/2021 Left 03/11/2021  Hip flexion      Hip extension      Hip abduction      Hip adduction      Hip internal rotation      Hip external rotation      Knee flexion 120 115  Knee extension 0 0  Ankle dorsiflexion      Ankle plantarflexion      Ankle inversion      Ankle eversion       (Blank rows = not tested)   LE MMT:   MMT Right 03/11/2021  Left 03/11/2021  Hip flexion 4/5 4/5  Hip extension      Hip abduction 4/5 NT due to pain, dizzy  Hip adduction      Hip internal rotation      Hip external rotation      Knee flexion 4/5 4/5  Knee extension 4/5 4/5  Ankle dorsiflexion 5/5 5/5   Ankle plantarflexion      Ankle inversion      Ankle eversion       (Blank rows = not tested)   LOWER EXTREMITY SPECIAL TESTS: NT   FUNCTIONAL TESTS:  5 times sit to stand: 11 sec , poor control an balance, painful  2 minute walk test: 372 feet , pain 5/10.                                Pt with dyspnea, pain and anxiety     TODAY'S TREATMENT: [facility] Adult PT Treatment:                                                DATE: 03/21/21 Therapeutic Exercise: Level 5 x 3 minutes; LE/UE Seated hamstring stretch 30 sec Reviewed IT band stretch  Reviewed HEP Hip bridge 2 x 15  Standing march 2 x 10 single UE support Standing calf raise 2 x 15 Standing hip extension 2 x 10  Standing hip abduction 2 x 10  Mini squats with BUE support multiple reps    [facility] Adult PT Treatment:                                                DATE: 03/19/2021 Therapeutic Exercise: Standing IT band stretch with overhead reach 2x30sec BIL in // bars Standing marching on Airex pad in // bars 3x20 Standing heel taps x5 BIL Manual Therapy: N/A Neuromuscular re-ed: N/A Therapeutic Activity: Forward step-up, retro step-down on 4-inch step 2x10 BIL in // bars  Traditional dead lift with two 5# dumbbells 3x8 in // bars Side stepping in // bars x2 laps   PATIENT EDUCATION:  Education details: N/A Person educated: N/A Education method: N/A Education comprehension: N/A     HOME EXERCISE PROGRAM: Access Code: [email or web address] URL: [email or web address] Date: 03/11/2021 Prepared by: Raeford Razor   Exercises Seated Long Arc Quad - 2 x daily - 7 x weekly - 2 sets - 10 reps - 5 hold Supine Active Straight Leg Raise - 2 x daily - 7 x weekly - 2 sets - 10 reps -  5 hold Seated Hamstring Stretch - 2 x daily - 7 x weekly - 1 sets - 3 reps - 30 hold Sidelying Hip Abduction - 2 x daily - 7 x weekly - 2 sets - 10 reps - 5 hold     ASSESSMENT:   CLINICAL IMPRESSION: Time spent reviewing HEP as patient was unsure of positioning for IT band stretch and reports onset of nausea with seated hamstring stretch. Able to demonstrate proper sequence for IT band stretch as well as complete hamstring stretch without onset of nausea. Continued with hip and knee strengthening focusing on standing strengthening, which she fatigues very quickly with requiring frequent standing rest breaks and water throughout session. She requires continued emphasis on improving squat mechanics as she has difficulty controlling for excessive anterior tibial translation and excessive lumbar flexion.    REHAB POTENTIAL: Excellent   CLINICAL DECISION MAKING: Stable/uncomplicated   EVALUATION COMPLEXITY: Low     GOALS: Goals reviewed with patient? No           LONG TERM GOALS:    LTG Name Target Date Goal status  1 Pt will be I with HEP for bilateral knee ROM and strength Baseline: 04/22/2021 INITIAL  2 Pt will be able to get up from sofa, chair and toilet without assist > 75% of the time, pain < 3/10 Baseline: 04/22/2021 INITIAL  3 Pt will be able to score   55% or better on FOTO to demo improved functional mobility  Baseline: 04/22/2021 INITIAL  4 Pt will be able to negotiate > 6 steps with only 1 rail, mod I in clinic and at sister's home  Baseline: 04/22/2021 INITIAL  5 Pt will be able to report less pain overall with household tasks, ADLs, 25% improved or more  Baseline: 04/22/2021 INITIAL                      PLAN: PT FREQUENCY: 2 x per week    PT DURATION: 6 weeks   PLANNED INTERVENTIONS: Therapeutic exercises, Therapeutic activity, Neuro Muscular re-education, Balance training, Gait training, Patient/Family education, Joint mobilization, Dry Needling, Cryotherapy, Moist  heat, Taping, Ionotophoresis 4mg /ml Dexamethasone, and Manual therapy   PLAN FOR NEXT SESSION: review/update HEP, nustep. Sit to stand ,begin strengthening ,knee AROM and closed chain asap   Gwendolyn Grant, PT, DPT, ATC 03/21/21 5:13 PM

## 2021-03-18 NOTE — Therapy (Signed)
OUTPATIENT PHYSICAL THERAPY TREATMENT NOTE   Patient Name: [name] MRN: [id number] DOB:1945-06-22, 76 y.o., female Today's Date: 03/19/2021  PCP: Jacelyn Pi, Lilia Argue, MD REFERRING PROVIDER: Donald Prose, MD   PT End of Session - 03/19/21 0946     Visit Number 2    Number of Visits 12    Date for PT Re-Evaluation 04/22/21    Authorization Type [employer]    PT Start Time [phone]    PT Stop Time 1030    PT Time Calculation (min) 43 min    Activity Tolerance Patient limited by pain    Behavior During Therapy [facility] for tasks assessed/performed             Past Medical History:  Diagnosis Date   Abnormal perimenopausal bleeding 2005   Allergy    Anemia    Anxiety    Dysuria 2004   Endometrial polyp 2006   Environmental allergies    Fibroid 02/19/03   H/O varicella    History of measles, mumps, or rubella    Hx of colonic polyps 12/15/2010   Hypertension 07-19-2010   echo for pre-op EF 55%  normal left wall thickness LA mildly dialated with mitral and tricuspid regurgatation   Hypothyroidism    Interstitial cystitis    Menopausal symptoms 2004   Obesity    Osteopenia 02/2012   -1.8 T score right femur neck   PMB (postmenopausal bleeding) 06/21/10   Sleep apnea    Urinary incontinence 2011   Vitamin D deficiency    history of   Past Surgical History:  Procedure Laterality Date   COLONOSCOPY  12/15/10   HYSTEROSCOPY  2006 and May 2012   for fibroids, endometrial polyps   [place]   Patient Active Problem List   Diagnosis Date Noted   Cyst of skin 10/02/2018   Obstructive sleep apnea treated with continuous positive airway pressure (CPAP) 07/02/2017   Osteopenia 09/01/2016   Urinary incontinence in female 09/01/2016   Vitamin D deficiency 09/01/2016   BMI 35.0-35.9,adult 11/16/2015   Essential hypertension, benign 04/28/2012   Osteoarthritis of left knee 04/28/2012   Hypothyroidism 04/28/2012   Hx of colonic polyps  12/15/2010   Anxiety 11/17/2010    REFERRING DIAG: M25.569 (ICD-10-CM) - Pain in unspecified knee   THERAPY DIAG:  Chronic pain of left knee  Chronic pain of right knee  Repeated falls  Muscle weakness (generalized)  PERTINENT HISTORY: Patient was here for PT for falls in 2021 with some improved mobility.  She had knee pain at that time.  HTN, Osteopenia  PRECAUTIONS: None  SUBJECTIVE: Pt reports daily adherence to her HEP and reports that when bending her knee, she notices lateral Lt knee pain. She adds that she has difficulty with curb negotiation. She reports no pain currently, but reports pain mostly when bending her knee.  PAIN:  Are you having pain? No NPRS scale: 0/10 Pain location: knee Pain orientation: Left and Lateral  PAIN TYPE: aching and sharp Pain description: intermittent  Aggravating factors: Bending knee Relieving factors: rest     OBJECTIVE:   *Unless otherwise noted, objective information collected previously* DIAGNOSTIC FINDINGS: none available recent states she has had one after her fall    PATIENT SURVEYS:  FOTO 36%   COGNITION:          Overall cognitive status: Within functional limits for tasks assessed  SENSATION:          Light touch: Appears intact          Stereognosis: Appears intact          Hot/Cold: Appears intact          Proprioception: Appears intact     POSTURE:  Stands with wide BOS, genu valgus   PALPATION: Tender to palpation medial knee R>L as well as laterally. Normal patella mobility.    LE AROM/PROM:   A/PROM Right 03/11/2021 Left 03/11/2021  Hip flexion      Hip extension      Hip abduction      Hip adduction      Hip internal rotation      Hip external rotation      Knee flexion 120 115  Knee extension 0 0  Ankle dorsiflexion      Ankle plantarflexion      Ankle inversion      Ankle eversion       (Blank rows = not tested)   LE MMT:   MMT Right 03/11/2021 Left 03/11/2021  Hip  flexion 4/5 4/5  Hip extension      Hip abduction 4/5 NT due to pain, dizzy  Hip adduction      Hip internal rotation      Hip external rotation      Knee flexion 4/5 4/5  Knee extension 4/5 4/5  Ankle dorsiflexion 5/5 5/5   Ankle plantarflexion      Ankle inversion      Ankle eversion       (Blank rows = not tested)   LOWER EXTREMITY SPECIAL TESTS: NT   FUNCTIONAL TESTS:  5 times sit to stand: 11 sec , poor control an balance, painful  2 minute walk test: 372 feet , pain 5/10.                                Pt with dyspnea, pain and anxiety     TODAY'S TREATMENT:  [facility] Adult PT Treatment:                                                DATE: 03/19/2021 Therapeutic Exercise: Standing IT band stretch with overhead reach 2x30sec BIL in // bars Standing marching on Airex pad in // bars 3x20 Standing heel taps x5 BIL Manual Therapy: N/A Neuromuscular re-ed: N/A Therapeutic Activity: Forward step-up, retro step-down on 4-inch step 2x10 BIL in // bars Traditional dead lift with two 5# dumbbells 3x8 in // bars Side stepping in // bars x2 laps Modalities: N/A Self Care: N/A   EVAL TREATMENT: PT/POC, HEP , eval findings and potential to improve . Hurt not harming herself.      PATIENT EDUCATION:  Education details: Updated HEP, educated on importance of knee alignment with closed chain knee flexion Person educated: Patient Education method: Consulting civil engineer, Media planner, Verbal cues, and Handouts Education comprehension: verbalized understanding, returned demonstration, and needs further education     HOME EXERCISE PROGRAM: Access Code: [email or web address] URL: [email or web address] Date: 03/11/2021 Prepared by: Raeford Razor   Exercises Seated Long Arc Quad - 2 x daily - 7 x weekly - 2 sets - 10 reps - 5 hold Supine Active Straight Leg Raise - 2 x daily - 7 x weekly -  2 sets - 10 reps - 5 hold Seated Hamstring Stretch - 2 x daily - 7 x weekly - 1 sets - 3 reps - 30  hold Sidelying Hip Abduction - 2 x daily - 7 x weekly - 2 sets - 10 reps - 5 hold  Added 03/19/2021: Standing ITB Stretch - 1 x daily - 7 x weekly - 3 sets - 30 hold     ASSESSMENT:   CLINICAL IMPRESSION: Pt responded well to all interventions today, demonstrating good form with verbal and tactile cuing and no increase in pain except minor increase with 4-inch stepping. However, she reports a therapeutic response to standing IT band stretching. The pt will continue to benefit from skilled PT to address her primary impairments and return to her prior level of function with less limitation.   REHAB POTENTIAL: Excellent   CLINICAL DECISION MAKING: Stable/uncomplicated   EVALUATION COMPLEXITY: Low     GOALS: Goals reviewed with patient? No           LONG TERM GOALS:    LTG Name Target Date Goal status  1 Pt will be I with HEP for bilateral knee ROM and strength Baseline: 04/22/2021 INITIAL  2 Pt will be able to get up from sofa, chair and toilet without assist > 75% of the time, pain < 3/10 Baseline: 04/22/2021 INITIAL  3 Pt will be able to score   55% or better on FOTO to demo improved functional mobility  Baseline: 04/22/2021 INITIAL  4 Pt will be able to negotiate > 6 steps with only 1 rail, mod I in clinic and at sister's home  Baseline: 04/22/2021 INITIAL  5 Pt will be able to report less pain overall with household tasks, ADLs, 25% improved or more  Baseline: 04/22/2021 INITIAL                      PLAN: PT FREQUENCY: 2 x per week    PT DURATION: 6 weeks   PLANNED INTERVENTIONS: Therapeutic exercises, Therapeutic activity, Neuro Muscular re-education, Balance training, Gait training, Patient/Family education, Joint mobilization, Dry Needling, Cryotherapy, Moist heat, Taping, Ionotophoresis 4mg /ml Dexamethasone, and Manual therapy   PLAN FOR NEXT SESSION: nustep. Progress closed-chain quad and hip strengthening as able    Vanessa Westfield, PT, DPT 03/19/21 10:35  AM

## 2021-03-19 ENCOUNTER — Ambulatory Visit: Payer: Medicare PPO

## 2021-03-19 ENCOUNTER — Other Ambulatory Visit: Payer: Self-pay

## 2021-03-19 DIAGNOSIS — M25661 Stiffness of right knee, not elsewhere classified: Secondary | ICD-10-CM | POA: Diagnosis not present

## 2021-03-19 DIAGNOSIS — M25662 Stiffness of left knee, not elsewhere classified: Secondary | ICD-10-CM | POA: Diagnosis not present

## 2021-03-19 DIAGNOSIS — G8929 Other chronic pain: Secondary | ICD-10-CM | POA: Diagnosis not present

## 2021-03-19 DIAGNOSIS — M25561 Pain in right knee: Secondary | ICD-10-CM

## 2021-03-19 DIAGNOSIS — R296 Repeated falls: Secondary | ICD-10-CM

## 2021-03-19 DIAGNOSIS — M25562 Pain in left knee: Secondary | ICD-10-CM | POA: Diagnosis not present

## 2021-03-19 DIAGNOSIS — M6281 Muscle weakness (generalized): Secondary | ICD-10-CM | POA: Diagnosis not present

## 2021-03-19 DIAGNOSIS — Z9181 History of falling: Secondary | ICD-10-CM | POA: Diagnosis not present

## 2021-03-21 ENCOUNTER — Other Ambulatory Visit: Payer: Self-pay

## 2021-03-21 ENCOUNTER — Ambulatory Visit: Payer: Medicare PPO

## 2021-03-21 DIAGNOSIS — M25562 Pain in left knee: Secondary | ICD-10-CM | POA: Diagnosis not present

## 2021-03-21 DIAGNOSIS — M25661 Stiffness of right knee, not elsewhere classified: Secondary | ICD-10-CM | POA: Diagnosis not present

## 2021-03-21 DIAGNOSIS — R296 Repeated falls: Secondary | ICD-10-CM

## 2021-03-21 DIAGNOSIS — M25662 Stiffness of left knee, not elsewhere classified: Secondary | ICD-10-CM | POA: Diagnosis not present

## 2021-03-21 DIAGNOSIS — M25561 Pain in right knee: Secondary | ICD-10-CM | POA: Diagnosis not present

## 2021-03-21 DIAGNOSIS — G8929 Other chronic pain: Secondary | ICD-10-CM

## 2021-03-21 DIAGNOSIS — M6281 Muscle weakness (generalized): Secondary | ICD-10-CM | POA: Diagnosis not present

## 2021-03-21 DIAGNOSIS — Z9181 History of falling: Secondary | ICD-10-CM | POA: Diagnosis not present

## 2021-03-22 ENCOUNTER — Ambulatory Visit (INDEPENDENT_AMBULATORY_CARE_PROVIDER_SITE_OTHER): Payer: Medicare PPO | Admitting: Psychology

## 2021-03-22 DIAGNOSIS — F411 Generalized anxiety disorder: Secondary | ICD-10-CM

## 2021-03-22 NOTE — Progress Notes (Signed)
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 (Status: maintained) -- No Description Entered  Excessive and/or unrealistic worry that is difficult to control occurring more days than not for at least 6 months about a number of events or activities. (Status: maintained) -- No Description Entered  Medication Status compliance  Safety none  If Suicidal or Homicidal State Action Taken: unspecified  Current Risk: low Medications unspecified Objectives Related Problem: Resolve the core conflict that is the source of anxiety. Description: Describe situations, thoughts, feelings, and actions associated with anxieties and worries, their impact on functioning, and attempts to resolve them. Target Date: 2022-02-12 Frequency: Daily Modality: individual Progress: 60%  Related Problem: Resolve the core conflict that is the source of anxiety. Description: Learn and implement calming skills to reduce overall anxiety and manage anxiety symptoms. Target Date: 2022-02-12 Frequency: Daily Modality: individual Progress: 60%  Related Problem: Resolve the core conflict that is the source of anxiety. Description: Learn and implement problem-solving strategies for realistically addressing worries. Target Date: 2022-02-12 Frequency: Daily Modality: individual Progress: 60%  Related Problem: Resolve the core conflict that is the source of anxiety. Description: Maintain involvement in work, family, and social activities. Target Date: 2022-02-12 Frequency: Daily Modality: individual Progress: 50%  Related Problem: Restore restful sleep pattern. Description: Describe the history and details of sleep pattern. Target Date: 2021-03-15 Frequency: Daily Modality: individual Progress: 70%    Client Response full compliance  Service Location Location, [address] [place], [place]  [address]  Service Code cpt [phone]  Self-monitoring  Lifestyle change (exercise, nutrition)  Self care activities  Distress tolerance skill  Identify/label emotions  Identify automatic thoughts  Facilitate problem solving  Normalize/Reframe  Behavioral activation plan  Validate/empathize  Comments  Dx.: Generalized Anxiety (F41.1)  Meds: No psychotropics Goals: Maryalice is seeking to develop a more satisfying life-style that takes into account her current physical condition. Goal date 11-04-20. States that the current situation in the world (politics and Covid 19) is making her very anxious. She is "obsessed" with the news (social media) and remains in a constant state of stress. She wants to learn to be less reactive. Will initiate behaviorally oriented individual therapy to address symptoms. Goal date is 10-04-21. While patient has realized some improvement in level of anxiety, she still expresses considerable stress and anxiety. Continues to need better balance in life. Revised date is 12-23.  Patient agrees to a video Webex video session due to Covid 19. She is at home and I am at my home office.   Kristia says it has been a rough time in that she had a couple of friends pass away. She is also distressed about her body's deterioration and her chronic pain. Has a chronic fear of falling. She had a fall 20 years ago and is exceptionally cautious. She talked about the need for improved self-care.   [name], PhD  Time: 3:10-4:00 50 minutes                  [name], PhD

## 2021-03-24 DIAGNOSIS — F411 Generalized anxiety disorder: Secondary | ICD-10-CM | POA: Diagnosis not present

## 2021-03-24 DIAGNOSIS — L719 Rosacea, unspecified: Secondary | ICD-10-CM | POA: Diagnosis not present

## 2021-03-24 DIAGNOSIS — Z8249 Family history of ischemic heart disease and other diseases of the circulatory system: Secondary | ICD-10-CM | POA: Diagnosis not present

## 2021-03-24 DIAGNOSIS — E039 Hypothyroidism, unspecified: Secondary | ICD-10-CM | POA: Diagnosis not present

## 2021-03-24 DIAGNOSIS — M1990 Unspecified osteoarthritis, unspecified site: Secondary | ICD-10-CM | POA: Diagnosis not present

## 2021-03-24 DIAGNOSIS — R32 Unspecified urinary incontinence: Secondary | ICD-10-CM | POA: Diagnosis not present

## 2021-03-24 DIAGNOSIS — Z882 Allergy status to sulfonamides status: Secondary | ICD-10-CM | POA: Diagnosis not present

## 2021-03-24 DIAGNOSIS — I10 Essential (primary) hypertension: Secondary | ICD-10-CM | POA: Diagnosis not present

## 2021-03-24 NOTE — Therapy (Signed)
OUTPATIENT PHYSICAL THERAPY TREATMENT NOTE   Patient Name: [name] MRN: [id number] DOB:[birth year], 76 y.o., female Today's Date: 03/25/2021  PCP: Jacelyn Pi, Lilia Argue, MD REFERRING PROVIDER: Donald Prose, MD   PT End of Session - 03/25/21 1344     Visit Number 4    Number of Visits 12    Date for PT Re-Evaluation 04/22/21    Authorization Type [employer]    PT Start Time 1345    PT Stop Time 1430    PT Time Calculation (min) 45 min    Equipment Utilized During Treatment Gait belt    Activity Tolerance Patient limited by pain    Behavior During Therapy [facility] for tasks assessed/performed              Past Medical History:  Diagnosis Date   Abnormal perimenopausal bleeding 2005   Allergy    Anemia    Anxiety    Dysuria 2004   Endometrial polyp 2006   Environmental allergies    Fibroid 02/19/03   H/O varicella    History of measles, mumps, or rubella    Hx of colonic polyps 12/15/2010   Hypertension 07-19-2010   echo for pre-op EF 55%  normal left wall thickness LA mildly dialated with mitral and tricuspid regurgatation   Hypothyroidism    Interstitial cystitis    Menopausal symptoms 2004   Obesity    Osteopenia 02/2012   -1.8 T score right femur neck   PMB (postmenopausal bleeding) 06/21/10   Sleep apnea    Urinary incontinence 2011   Vitamin D deficiency    history of   Past Surgical History:  Procedure Laterality Date   COLONOSCOPY  12/15/10   HYSTEROSCOPY  2006 and May 2012   for fibroids, endometrial polyps   [place]   Patient Active Problem List   Diagnosis Date Noted   Cyst of skin 10/02/2018   Obstructive sleep apnea treated with continuous positive airway pressure (CPAP) 07/02/2017   Osteopenia 09/01/2016   Urinary incontinence in female 09/01/2016   Vitamin D deficiency 09/01/2016   BMI 35.0-35.9,adult 11/16/2015   Essential hypertension, benign 04/28/2012   Osteoarthritis of left knee 04/28/2012    Hypothyroidism 04/28/2012   Hx of colonic polyps 12/15/2010   Anxiety 11/17/2010    REFERRING DIAG: M25.569 (ICD-10-CM) - Pain in unspecified knee   THERAPY DIAG:  Chronic pain of left knee  Chronic pain of right knee  Repeated falls  Muscle weakness (generalized)  History of falling  ONSET DATE: chronic , Sept. 2022, fall    SUBJECTIVE:    SUBJECTIVE STATEMENT:  Pt reports continued BIL Rt>Lt knee pain today. She adds that she has been doing her HEP nearly every day but reports that she couldn't remember how to perform her IT band stretching.  PERTINENT HISTORY: Patient was here for PT for falls in 2021 with some improved mobility.  She had knee pain at that time.  HTN, Osteopenia   PAIN:  Are you having pain? Yes VAS scale: 7/10 Pain location: Rt > Lt knees  Pain orientation: Bilateral and Lateral  PAIN TYPE: aching Pain description: aching  Aggravating factors: transfers ,walking  Relieving factors: rest    PRECAUTIONS: None   WEIGHT BEARING RESTRICTIONS No  OBJECTIVE:   Unless otherwise noted all objective measures were performed on initial evaluation.* DIAGNOSTIC FINDINGS: none available recent states she has had one after her fall    PATIENT SURVEYS:  FOTO 36%   COGNITION:          Overall cognitive status: Within functional limits for tasks assessed                        SENSATION:          Light touch: Appears intact          Stereognosis: Appears intact          Hot/Cold: Appears intact          Proprioception: Appears intact     POSTURE:  Stands with wide BOS, genu valgus   PALPATION: Tender to palpation medial knee R>L as well as laterally. Normal patella mobility.    LE AROM/PROM:   A/PROM Right 03/11/2021 Left 03/11/2021  Hip flexion      Hip extension      Hip abduction      Hip adduction      Hip internal rotation      Hip external rotation      Knee flexion 120 115  Knee extension 0 0  Ankle dorsiflexion      Ankle  plantarflexion      Ankle inversion      Ankle eversion       (Blank rows = not tested)   LE MMT:   MMT Right 03/11/2021 Left 03/11/2021  Hip flexion 4/5 4/5  Hip extension      Hip abduction 4/5 NT due to pain, dizzy  Hip adduction      Hip internal rotation      Hip external rotation      Knee flexion 4/5 4/5  Knee extension 4/5 4/5  Ankle dorsiflexion 5/5 5/5   Ankle plantarflexion      Ankle inversion      Ankle eversion       (Blank rows = not tested)   LOWER EXTREMITY SPECIAL TESTS: NT   FUNCTIONAL TESTS:  5 times sit to stand: 11 sec , poor control an balance, painful  2 minute walk test: 372 feet , pain 5/10.                                Pt with dyspnea, pain and anxiety     TODAY'S TREATMENT:  [facility] Adult PT Treatment:                                                DATE: 03/25/2021 Therapeutic Exercise: NuStep level 1 x5 minutes while collecting subjective information Mini squat on Airex pad in // bars 2x10 Bridge with GTB abduction 2x10 with 3-sec hold Sit-to-stand 2x10 Manual Therapy: N/A Neuromuscular re-ed: Tandem stance on Airex pad with PT perturbations laterally in // bars 3x30 sec Marching on Airex pad in // bars 3x20 Romberg stance on Airex pad with eyes closed in // bars Therapeutic Activity: N/A Modalities: N/A Self Care: N/A   [facility] Adult PT Treatment:                                                DATE: 03/21/21 Therapeutic Exercise: Level 5 x 3 minutes; LE/UE Seated hamstring stretch 30  sec Reviewed IT band stretch  Reviewed HEP Hip bridge 2 x 15  Standing march 2 x 10 single UE support Standing calf raise 2 x 15 Standing hip extension 2 x 10  Standing hip abduction 2 x 10  Mini squats with BUE support multiple reps    [facility] Adult PT Treatment:                                                DATE: 03/19/2021 Therapeutic Exercise: Standing IT band stretch with overhead reach 2x30sec BIL in // bars Standing marching on Airex pad in //  bars 3x20 Standing heel taps x5 BIL Manual Therapy: N/A Neuromuscular re-ed: N/A Therapeutic Activity: Forward step-up, retro step-down on 4-inch step 2x10 BIL in // bars Traditional dead lift with two 5# dumbbells 3x8 in // bars Side stepping in // bars x2 laps   PATIENT EDUCATION:  Education details: Updated and reviewed HEP Person educated: Patient Education method: Verbal, demonstration, handout Education comprehension: Verbalized understanding, returned demonstration     HOME EXERCISE PROGRAM: Access Code: [email or web address] URL: [email or web address] Date: 03/11/2021 Prepared by: Raeford Razor   Exercises Seated Long Arc Quad - 2 x daily - 7 x weekly - 2 sets - 10 reps - 5 hold Supine Active Straight Leg Raise - 2 x daily - 7 x weekly - 2 sets - 10 reps - 5 hold Seated Hamstring Stretch - 2 x daily - 7 x weekly - 1 sets - 3 reps - 30 hold Sidelying Hip Abduction - 2 x daily - 7 x weekly - 2 sets - 10 reps - 5 hold  Added 03/25/2021: Standing ITB Stretch - 1 x daily - 7 x weekly - 3 sets - 30 hold Bridge with Hip Abduction and Resistance - 1 x daily - 7 x weekly - 3 sets - 10 reps - 3-sec hold     ASSESSMENT:   CLINICAL IMPRESSION: Pt responded well to all new exercises today, demonstrating good form and no increase in pain with selected interventions. Of note, she continues to require regular UE support with balance exercises in tandem and romberg stance on an Airex pad. She also demonstrates weak functional hip abduction strength as indicated by early fatigue with sidelying hip abduction. The pt will continue to benefit from skilled PT to address her primary impairments and return to her prior level of function with less limitation.   REHAB POTENTIAL: Excellent   CLINICAL DECISION MAKING: Stable/uncomplicated   EVALUATION COMPLEXITY: Low     GOALS: Goals reviewed with patient? No           LONG TERM GOALS:    LTG Name Target Date Goal status  1 Pt  will be I with HEP for bilateral knee ROM and strength Baseline: Achieved 03/25/2021 04/22/2021 ACHIEVED  2 Pt will be able to get up from sofa, chair and toilet without assist > 75% of the time, pain < 3/10 Baseline: 04/22/2021 INITIAL  3 Pt will be able to score   55% or better on FOTO to demo improved functional mobility  Baseline: 04/22/2021 INITIAL  4 Pt will be able to negotiate > 6 steps with only 1 rail, mod I in clinic and at sister's home  Baseline: 04/22/2021 INITIAL  5 Pt will be able to report less pain overall with household tasks, ADLs, 25% improved or more  Baseline: 04/22/2021 INITIAL                      PLAN: PT FREQUENCY: 2 x per week    PT DURATION: 6 weeks   PLANNED INTERVENTIONS: Therapeutic exercises, Therapeutic activity, Neuro Muscular re-education, Balance training, Gait training, Patient/Family education, Joint mobilization, Dry Needling, Cryotherapy, Moist heat, Taping, Ionotophoresis 4mg /ml Dexamethasone, and Manual therapy   PLAN FOR NEXT SESSION: review/update HEP, nustep. Sit to stand ,begin strengthening ,knee AROM and closed chain asap   Vanessa Shiloh, PT, DPT 03/25/21 2:28 PM

## 2021-03-25 ENCOUNTER — Other Ambulatory Visit: Payer: Self-pay

## 2021-03-25 ENCOUNTER — Ambulatory Visit: Payer: Medicare PPO

## 2021-03-25 DIAGNOSIS — M6281 Muscle weakness (generalized): Secondary | ICD-10-CM | POA: Diagnosis not present

## 2021-03-25 DIAGNOSIS — Z9181 History of falling: Secondary | ICD-10-CM | POA: Diagnosis not present

## 2021-03-25 DIAGNOSIS — M25562 Pain in left knee: Secondary | ICD-10-CM | POA: Diagnosis not present

## 2021-03-25 DIAGNOSIS — G8929 Other chronic pain: Secondary | ICD-10-CM | POA: Diagnosis not present

## 2021-03-25 DIAGNOSIS — M25662 Stiffness of left knee, not elsewhere classified: Secondary | ICD-10-CM | POA: Diagnosis not present

## 2021-03-25 DIAGNOSIS — R296 Repeated falls: Secondary | ICD-10-CM

## 2021-03-25 DIAGNOSIS — M25561 Pain in right knee: Secondary | ICD-10-CM | POA: Diagnosis not present

## 2021-03-25 DIAGNOSIS — M25661 Stiffness of right knee, not elsewhere classified: Secondary | ICD-10-CM | POA: Diagnosis not present

## 2021-03-28 ENCOUNTER — Ambulatory Visit: Payer: Medicare PPO | Admitting: Physical Therapy

## 2021-03-28 ENCOUNTER — Encounter: Payer: Medicare PPO | Admitting: Physical Therapy

## 2021-03-28 ENCOUNTER — Other Ambulatory Visit: Payer: Self-pay

## 2021-03-28 ENCOUNTER — Encounter: Payer: Self-pay | Admitting: Physical Therapy

## 2021-03-28 DIAGNOSIS — M25661 Stiffness of right knee, not elsewhere classified: Secondary | ICD-10-CM

## 2021-03-28 DIAGNOSIS — G8929 Other chronic pain: Secondary | ICD-10-CM | POA: Diagnosis not present

## 2021-03-28 DIAGNOSIS — M25662 Stiffness of left knee, not elsewhere classified: Secondary | ICD-10-CM

## 2021-03-28 DIAGNOSIS — Z9181 History of falling: Secondary | ICD-10-CM

## 2021-03-28 DIAGNOSIS — M25562 Pain in left knee: Secondary | ICD-10-CM

## 2021-03-28 DIAGNOSIS — R296 Repeated falls: Secondary | ICD-10-CM

## 2021-03-28 DIAGNOSIS — L821 Other seborrheic keratosis: Secondary | ICD-10-CM | POA: Diagnosis not present

## 2021-03-28 DIAGNOSIS — M6281 Muscle weakness (generalized): Secondary | ICD-10-CM | POA: Diagnosis not present

## 2021-03-28 DIAGNOSIS — M25561 Pain in right knee: Secondary | ICD-10-CM | POA: Diagnosis not present

## 2021-03-28 DIAGNOSIS — L718 Other rosacea: Secondary | ICD-10-CM | POA: Diagnosis not present

## 2021-03-28 NOTE — Therapy (Signed)
OUTPATIENT PHYSICAL THERAPY TREATMENT NOTE   Patient Name: [name] MRN: [id number] DOB:23-Nov-1945, 76 y.o., female Today's Date: 03/28/2021  PCP: Jacelyn Pi, Lilia Argue, MD REFERRING PROVIDER: Jacelyn Pi, Lilia Argue, *   PT End of Session - 03/28/21 0805     Visit Number 5    Number of Visits 12    Date for PT Re-Evaluation 04/22/21    Authorization Type [employer]    PT Start Time 0802    PT Stop Time 0845    PT Time Calculation (min) 43 min    Activity Tolerance Patient limited by fatigue    Behavior During Therapy [facility] for tasks assessed/performed              Past Medical History:  Diagnosis Date   Abnormal perimenopausal bleeding 2005   Allergy    Anemia    Anxiety    Dysuria 2004   Endometrial polyp 2006   Environmental allergies    Fibroid 02/19/03   H/O varicella    History of measles, mumps, or rubella    Hx of colonic polyps 12/15/2010   Hypertension 07-19-2010   echo for pre-op EF 55%  normal left wall thickness LA mildly dialated with mitral and tricuspid regurgatation   Hypothyroidism    Interstitial cystitis    Menopausal symptoms 2004   Obesity    Osteopenia 02/2012   -1.8 T score right femur neck   PMB (postmenopausal bleeding) 06/21/10   Sleep apnea    Urinary incontinence 2011   Vitamin D deficiency    history of   Past Surgical History:  Procedure Laterality Date   COLONOSCOPY  12/15/10   HYSTEROSCOPY  2006 and May 2012   for fibroids, endometrial polyps   [facility]   Patient Active Problem List   Diagnosis Date Noted   Cyst of skin 10/02/2018   Obstructive sleep apnea treated with continuous positive airway pressure (CPAP) 07/02/2017   Osteopenia 09/01/2016   Urinary incontinence in female 09/01/2016   Vitamin D deficiency 09/01/2016   BMI 35.0-35.9,adult 11/16/2015   Essential hypertension, benign 04/28/2012   Osteoarthritis of left knee 04/28/2012   Hypothyroidism 04/28/2012   Hx of  colonic polyps 12/15/2010   Anxiety 11/17/2010    REFERRING DIAG: M25.569 (ICD-10-CM) - Pain in unspecified knee   THERAPY DIAG:  Chronic pain of left knee  Chronic pain of right knee  Repeated falls  Muscle weakness (generalized)  History of falling  Stiffness of left knee, not elsewhere classified  Stiffness of right knee, not elsewhere classified  ONSET DATE: chronic , Sept. 2022, fall    SUBJECTIVE:    SUBJECTIVE STATEMENT:  Its too early.  No pain right now I'm not doing anything. Its easier to get up from the couch.  The toilet transfers have not.  I do my exercises regularly.    PERTINENT HISTORY: Patient was here for PT for falls in 2021 with some improved mobility.  She had knee pain at that time.  HTN, Osteopenia   PAIN:  Are you having pain? Yes VAS scale: 0/10 Pain location: Rt > Lt knees  Pain orientation: Bilateral and Lateral  PAIN TYPE: aching Pain description: aching  Aggravating factors: transfers ,walking  Relieving factors: rest    PRECAUTIONS: None   WEIGHT BEARING RESTRICTIONS No  OBJECTIVE:   Unless otherwise noted all objective measures were performed on initial evaluation.* DIAGNOSTIC FINDINGS: none available recent states she has had  one after her fall    PATIENT SURVEYS:  FOTO 36%   COGNITION:          Overall cognitive status: Within functional limits for tasks assessed                        SENSATION:          Light touch: Appears intact          Stereognosis: Appears intact          Hot/Cold: Appears intact          Proprioception: Appears intact     POSTURE:  Stands with wide BOS, genu valgus   PALPATION: Tender to palpation medial knee R>L as well as laterally. Normal patella mobility.    LE AROM/PROM:   A/PROM Right 03/11/2021 Left 03/11/2021  Hip flexion      Hip extension      Hip abduction      Hip adduction      Hip internal rotation      Hip external rotation      Knee flexion 120 115  Knee extension 0 0   Ankle dorsiflexion      Ankle plantarflexion      Ankle inversion      Ankle eversion       (Blank rows = not tested)   LE MMT:   MMT Right 03/11/2021 Left 03/11/2021  Hip flexion 4/5 4/5  Hip extension      Hip abduction 4/5 NT due to pain, dizzy  Hip adduction      Hip internal rotation      Hip external rotation      Knee flexion 4/5 4/5  Knee extension 4/5 4/5  Ankle dorsiflexion 5/5 5/5   Ankle plantarflexion      Ankle inversion      Ankle eversion       (Blank rows = not tested)   LOWER EXTREMITY SPECIAL TESTS: NT   FUNCTIONAL TESTS:  5 times sit to stand: 11 sec , poor control an balance, painful  2 minute walk test: 372 feet , pain 5/10.                                Pt with dyspnea, pain and anxiety     TODAY'S TREATMENT:  [facility] Adult PT Treatment/Exercise:________________________1/23/23   Therapeutic Exercise: Supine SLR x 10  Bridge x 10 , unable to do this at home due to soft mattress Hip abduction x 10 x 2  Clam x 15 red band  Sit to stand x 15, 2 different hgts 4 inch step ups x 10 reps UE assist needed  Hip extension at counter top 2 x 10  Doorway hip hinging x 10 min cues needed.  Pt took time to use the bathroom  NuStep LE only for 5 min L 6 added UE for reducing discomfort    [facility] Adult PT Treatment:                                                DATE: 03/25/2021 Therapeutic Exercise: NuStep level 1 x5 minutes while collecting subjective information Mini squat on Airex pad in // bars 2x10 Bridge with GTB abduction 2x10 with 3-sec hold Sit-to-stand 2x10 Manual Therapy:  N/A Neuromuscular re-ed: Tandem stance on Airex pad with PT perturbations laterally in // bars 3x30 sec Marching on Airex pad in // bars 3x20 Romberg stance on Airex pad with eyes closed in // bars Therapeutic Activity: N/A Modalities: N/A Self Care: N/A   [facility] Adult PT Treatment:                                                DATE: 03/21/21 Therapeutic  Exercise: Level 5 x 3 minutes; LE/UE Seated hamstring stretch 30 sec Reviewed IT band stretch  Reviewed HEP Hip bridge 2 x 15  Standing march 2 x 10 single UE support Standing calf raise 2 x 15 Standing hip extension 2 x 10  Standing hip abduction 2 x 10  Mini squats with BUE support multiple reps    [facility] Adult PT Treatment:                                                DATE: 03/19/2021 Therapeutic Exercise: Standing IT band stretch with overhead reach 2x30sec BIL in // bars Standing marching on Airex pad in // bars 3x20 Standing heel taps x5 BIL Manual Therapy: N/A Neuromuscular re-ed: N/A Therapeutic Activity: Forward step-up, retro step-down on 4-inch step 2x10 BIL in // bars Traditional dead lift with two 5# dumbbells 3x8 in // bars Side stepping in // bars x2 laps   PATIENT EDUCATION:  Education details: Updated and reviewed HEP Person educated: Patient Education method: Verbal, demonstration, handout Education comprehension: Verbalized understanding, returned demonstration     HOME EXERCISE PROGRAM: Access Code: [email or web address] URL: [email or web address] Date: 03/11/2021 Prepared by: Raeford Razor   Exercises Seated Long Arc Quad - 2 x daily - 7 x weekly - 2 sets - 10 reps - 5 hold Supine Active Straight Leg Raise - 2 x daily - 7 x weekly - 2 sets - 10 reps - 5 hold Seated Hamstring Stretch - 2 x daily - 7 x weekly - 1 sets - 3 reps - 30 hold Sidelying Hip Abduction - 2 x daily - 7 x weekly - 2 sets - 10 reps - 5 hold  Added 03/25/2021: Standing ITB Stretch - 1 x daily - 7 x weekly - 3 sets - 30 hold Bridge with Hip Abduction and Resistance - 1 x daily - 7 x weekly - 3 sets - 10 reps - 3-sec hold     ASSESSMENT:   CLINICAL IMPRESSION: Pt with limited tolerance for exercise today, was tired and knee pain increased with standing.  Emphasized hip strength. She is able to report some subjective improvement with ADLs but has trouble standing from the toilet  without some sort of UE support.  She will continue to benefit from PT in order to strengthen functionally.      REHAB POTENTIAL: Excellent   CLINICAL DECISION MAKING: Stable/uncomplicated   EVALUATION COMPLEXITY: Low     GOALS: Goals reviewed with patient? No           LONG TERM GOALS:    LTG Name Target Date Goal status  1 Pt will be I with HEP for bilateral knee ROM and strength Baseline: Achieved 03/25/2021 04/22/2021 ACHIEVED  2 Pt will be able to get up from  sofa, chair and toilet without assist > 75% of the time, pain < 3/10 Baseline: 04/22/2021 INITIAL  3 Pt will be able to score   55% or better on FOTO to demo improved functional mobility  Baseline: 04/22/2021 INITIAL  4 Pt will be able to negotiate > 6 steps with only 1 rail, mod I in clinic and at sister's home  Baseline: 04/22/2021 INITIAL  5 Pt will be able to report less pain overall with household tasks, ADLs, 25% improved or more  Baseline: 04/22/2021 INITIAL                      PLAN: PT FREQUENCY: 2 x per week    PT DURATION: 6 weeks   PLANNED INTERVENTIONS: Therapeutic exercises, Therapeutic activity, Neuro Muscular re-education, Balance training, Gait training, Patient/Family education, Joint mobilization, Dry Needling, Cryotherapy, Moist heat, Taping, Ionotophoresis 4mg /ml Dexamethasone, and Manual therapy   PLAN FOR NEXT SESSION: review/update HEP, nustep. Sit to stand ,begin strengthening ,knee AROM and closed chain asap   Raeford Razor, PT 03/28/21 8:41 AM Phone: [phone] Fax: [phone]

## 2021-03-30 ENCOUNTER — Ambulatory Visit: Payer: Medicare PPO | Admitting: Physical Therapy

## 2021-04-02 ENCOUNTER — Other Ambulatory Visit: Payer: Self-pay | Admitting: Internal Medicine

## 2021-04-04 ENCOUNTER — Other Ambulatory Visit: Payer: Self-pay

## 2021-04-04 ENCOUNTER — Ambulatory Visit: Payer: Medicare PPO | Admitting: Physical Therapy

## 2021-04-04 DIAGNOSIS — M25562 Pain in left knee: Secondary | ICD-10-CM

## 2021-04-04 DIAGNOSIS — Z9181 History of falling: Secondary | ICD-10-CM | POA: Diagnosis not present

## 2021-04-04 DIAGNOSIS — M25561 Pain in right knee: Secondary | ICD-10-CM | POA: Diagnosis not present

## 2021-04-04 DIAGNOSIS — M25662 Stiffness of left knee, not elsewhere classified: Secondary | ICD-10-CM | POA: Diagnosis not present

## 2021-04-04 DIAGNOSIS — M6281 Muscle weakness (generalized): Secondary | ICD-10-CM | POA: Diagnosis not present

## 2021-04-04 DIAGNOSIS — M25661 Stiffness of right knee, not elsewhere classified: Secondary | ICD-10-CM

## 2021-04-04 DIAGNOSIS — R296 Repeated falls: Secondary | ICD-10-CM

## 2021-04-04 DIAGNOSIS — G8929 Other chronic pain: Secondary | ICD-10-CM

## 2021-04-04 NOTE — Therapy (Signed)
OUTPATIENT PHYSICAL THERAPY TREATMENT NOTE   Patient Name: [name] MRN: [id number] DOB:09/01/1945, 76 y.o., female Today's Date: 04/04/2021  PCP: Jacelyn Pi, Lilia Argue, MD REFERRING PROVIDER: Jacelyn Pi, Lilia Argue, *   PT End of Session - 04/04/21 1555     Visit Number 6    Number of Visits 12    Date for PT Re-Evaluation 04/22/21    Authorization Type [employer]    PT Start Time 0240    PT Stop Time 1635    PT Time Calculation (min) 44 min    Activity Tolerance Patient tolerated treatment well    Behavior During Therapy [facility] for tasks assessed/performed              Past Medical History:  Diagnosis Date   Abnormal perimenopausal bleeding 2005   Allergy    Anemia    Anxiety    Dysuria 2004   Endometrial polyp 2006   Environmental allergies    Fibroid 02/19/03   H/O varicella    History of measles, mumps, or rubella    Hx of colonic polyps 12/15/2010   Hypertension 07-19-2010   echo for pre-op EF 55%  normal left wall thickness LA mildly dialated with mitral and tricuspid regurgatation   Hypothyroidism    Interstitial cystitis    Menopausal symptoms 2004   Obesity    Osteopenia 02/2012   -1.8 T score right femur neck   PMB (postmenopausal bleeding) 06/21/10   Sleep apnea    Urinary incontinence 2011   Vitamin D deficiency    history of   Past Surgical History:  Procedure Laterality Date   COLONOSCOPY  12/15/10   HYSTEROSCOPY  2006 and May 2012   for fibroids, endometrial polyps   [place]   Patient Active Problem List   Diagnosis Date Noted   Cyst of skin 10/02/2018   Obstructive sleep apnea treated with continuous positive airway pressure (CPAP) 07/02/2017   Osteopenia 09/01/2016   Urinary incontinence in female 09/01/2016   Vitamin D deficiency 09/01/2016   BMI 35.0-35.9,adult 11/16/2015   Essential hypertension, benign 04/28/2012   Osteoarthritis of left knee 04/28/2012   Hypothyroidism 04/28/2012   Hx  of colonic polyps 12/15/2010   Anxiety 11/17/2010    REFERRING DIAG: M25.569 (ICD-10-CM) - Pain in unspecified knee   THERAPY DIAG:  Chronic pain of left knee  Chronic pain of right knee  Repeated falls  Muscle weakness (generalized)  History of falling  Stiffness of left knee, not elsewhere classified  Stiffness of right knee, not elsewhere classified  ONSET DATE: chronic , Sept. 2022, fall    SUBJECTIVE:    SUBJECTIVE STATEMENT: I am getting stronger . I need to get more appts so I can get better.  The pain has moved to the front of my knee   PERTINENT HISTORY: Patient was here for PT for falls in 2021 with some improved mobility.  She had knee pain at that time.  HTN, Osteopenia   PAIN:  Are you having pain? none at rest  VAS scale: 4/10 with transfers  Pain location: Rt > Lt knees  Pain orientation: Bilateral and anterior  PAIN TYPE: aching Pain description: aching  Aggravating factors: transfers ,walking  Relieving factors: rest    PRECAUTIONS: None   WEIGHT BEARING RESTRICTIONS No  OBJECTIVE:   Unless otherwise noted all objective measures were performed on initial evaluation.* DIAGNOSTIC FINDINGS: none available recent states she has had one  after her fall    PATIENT SURVEYS:  FOTO 36%   COGNITION:          Overall cognitive status: Within functional limits for tasks assessed                        SENSATION:          Light touch: Appears intact          Stereognosis: Appears intact          Hot/Cold: Appears intact          Proprioception: Appears intact     POSTURE:  Stands with wide BOS, genu valgus   PALPATION: Tender to palpation medial knee R>L as well as laterally. Normal patella mobility.    LE AROM/PROM:   A/PROM Right 03/11/2021 Left 03/11/2021  Hip flexion      Hip extension      Hip abduction      Hip adduction      Hip internal rotation      Hip external rotation      Knee flexion 120 115  Knee extension 0 0  Ankle  dorsiflexion      Ankle plantarflexion      Ankle inversion      Ankle eversion       (Blank rows = not tested)   LE MMT:   MMT Right 03/11/2021 Left 03/11/2021  Hip flexion 4/5 4/5  Hip extension      Hip abduction 4/5 NT due to pain, dizzy  Hip adduction      Hip internal rotation      Hip external rotation      Knee flexion 4/5 4/5  Knee extension 4/5 4/5  Ankle dorsiflexion 5/5 5/5   Ankle plantarflexion      Ankle inversion      Ankle eversion       (Blank rows = not tested)   LOWER EXTREMITY SPECIAL TESTS: NT   FUNCTIONAL TESTS:  5 times sit to stand: 11 sec , poor control an balance, painful  2 minute walk test: 372 feet , pain 5/10.                                Pt with dyspnea, pain and anxiety     TODAY'S TREATMENT:   [facility] Adult PT Treatment:                                                DATE: 04/04/21 Therapeutic Exercise: NuStep LE and UE for ROM and warm up  Standing balance assessment on Airex  Narrow, wide, EO, EC and head turns, tandem each LE x 30 sec static   Step ups 6 inch x 20 bilateral UEs  Airex squats x 15 pain 5/10.  Step ups 4 inch x 10, then step down x 10 , unable to do due to Rt knee pain  Seated LAQ 3 lbs x 15 slow, late phase load, hold Hip abduction x 10 x 2 sets  SLR x 10 x 1 set with 3 lbs    [facility] Adult PT Treatment/Exercise:________________________1/23/23   Therapeutic Exercise: Supine SLR x 10  Bridge x 10 , unable to do this at home due to soft mattress Hip abduction x 10  x 2  Clam x 15 red band  Sit to stand x 15, 2 different hgts 4 inch step ups x 10 reps UE assist needed  Hip extension at counter top 2 x 10  Doorway hip hinging x 10 min cues needed.  Pt took time to use the bathroom  NuStep LE only for 5 min L 6 added UE for reducing discomfort    [facility] Adult PT Treatment:                                                DATE: 03/25/2021 Therapeutic Exercise: NuStep level 1 x5 minutes while collecting subjective  information Mini squat on Airex pad in // bars 2x10 Bridge with GTB abduction 2x10 with 3-sec hold Sit-to-stand 2x10 Manual Therapy: N/A Neuromuscular re-ed: Tandem stance on Airex pad with PT perturbations laterally in // bars 3x30 sec Marching on Airex pad in // bars 3x20 Romberg stance on Airex pad with eyes closed in // bars Therapeutic Activity: N/A Modalities: N/A Self Care: N/A  PATIENT EDUCATION:  Education details: Updated and reviewed HEP Person educated: Patient Education method: Verbal, demonstration, handout Education comprehension: Verbalized understanding, returned demonstration     HOME EXERCISE PROGRAM: Access Code: [email or web address] URL: [email or web address] Date: 03/11/2021 Prepared by: Raeford Razor   Exercises Seated Long Arc Quad - 2 x daily - 7 x weekly - 2 sets - 10 reps - 5 hold Supine Active Straight Leg Raise - 2 x daily - 7 x weekly - 2 sets - 10 reps - 5 hold Seated Hamstring Stretch - 2 x daily - 7 x weekly - 1 sets - 3 reps - 30 hold Sidelying Hip Abduction - 2 x daily - 7 x weekly - 2 sets - 10 reps - 5 hold  Added 03/25/2021: Standing ITB Stretch - 1 x daily - 7 x weekly - 3 sets - 30 hold Bridge with Hip Abduction and Resistance - 1 x daily - 7 x weekly - 3 sets - 10 reps - 3-sec hold     ASSESSMENT:   CLINICAL IMPRESSION: Pt did well, noticing less difficulty with getting up from the toilet. She continues to have pain and difficulty with knee control, stepping up and especially down from a small step.  Made more appts for 2 weeks.     REHAB POTENTIAL: Excellent   CLINICAL DECISION MAKING: Stable/uncomplicated   EVALUATION COMPLEXITY: Low     GOALS: Goals reviewed with patient? No           LONG TERM GOALS:    LTG Name Target Date Goal status  1 Pt will be I with HEP for bilateral knee ROM and strength Baseline: Achieved 03/25/2021 04/22/2021 ACHIEVED  2 Pt will be able to get up from sofa, chair and toilet without  assist > 75% of the time, pain < 3/10 Baseline: 04/22/2021 INITIAL  3 Pt will be able to score   55% or better on FOTO to demo improved functional mobility  Baseline: 04/22/2021 INITIAL  4 Pt will be able to negotiate > 6 steps with only 1 rail, mod I in clinic and at sister's home  Baseline: 04/22/2021 INITIAL  5 Pt will be able to report less pain overall with household tasks, ADLs, 25% improved or more  Baseline: 04/22/2021 INITIAL  PLAN: PT FREQUENCY: 2 x per week    PT DURATION: 6 weeks   PLANNED INTERVENTIONS: Therapeutic exercises, Therapeutic activity, Neuro Muscular re-education, Balance training, Gait training, Patient/Family education, Joint mobilization, Dry Needling, Cryotherapy, Moist heat, Taping, Ionotophoresis 4mg /ml Dexamethasone, and Manual therapy   PLAN FOR NEXT SESSION: update HEP, nustep. Sit to stand ,begin strengthening ,knee AROM and closed chain asap. STEP UPS, curbs    Raeford Razor, PT 04/04/21 4:32 PM Phone: [phone] Fax: [phone]

## 2021-04-05 ENCOUNTER — Ambulatory Visit (INDEPENDENT_AMBULATORY_CARE_PROVIDER_SITE_OTHER): Payer: Medicare PPO | Admitting: Psychology

## 2021-04-05 DIAGNOSIS — F411 Generalized anxiety disorder: Secondary | ICD-10-CM

## 2021-04-05 NOTE — Progress Notes (Signed)
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 (Status: maintained) -- No Description Entered  Excessive and/or unrealistic worry that is difficult to control occurring more days than not for at least 6 months about a number of events or activities. (Status: maintained) -- No Description Entered  Medication Status compliance  Safety none  If Suicidal or Homicidal State Action Taken: unspecified  Current Risk: low Medications unspecified Objectives Related Problem: Resolve the core conflict that is the source of anxiety. Description: Describe situations, thoughts, feelings, and actions associated with anxieties and worries, their impact on functioning, and attempts to resolve them. Target Date: 2022-02-12 Frequency: Daily Modality: individual Progress: 60%  Related Problem: Resolve the core conflict that is the source of anxiety. Description: Learn and implement calming skills to reduce overall anxiety and manage anxiety symptoms. Target Date: 2022-02-12 Frequency: Daily Modality: individual Progress: 60%  Related Problem: Resolve the core conflict that is the source of anxiety. Description: Learn and implement problem-solving strategies for realistically addressing worries. Target Date: 2022-02-12 Frequency: Daily Modality: individual Progress: 60%  Related Problem: Resolve the core conflict that is the source of anxiety. Description: Maintain involvement in work, family, and social activities. Target Date: 2022-02-12 Frequency: Daily Modality: individual Progress: 50%  Related Problem: Restore restful sleep pattern. Description: Describe the history and details of sleep pattern. Target Date: 2021-03-15 Frequency: Daily Modality: individual Progress: 70%    Client Response full compliance  Service Location Location, [address]  [place], [place] [address]  Service Code cpt [phone]  Self-monitoring  Lifestyle change (exercise, nutrition)  Self care activities  Distress tolerance skill  Identify/label emotions  Identify automatic thoughts  Facilitate problem solving  Normalize/Reframe  Behavioral activation plan  Validate/empathize  Comments  Dx.: Generalized Anxiety (F41.1)  Meds: No psychotropics Goals: Athea is seeking to develop a more satisfying life-style that takes into account her current physical condition. Goal date 11-04-20. States that the current situation in the world (politics and Covid 19) is making her very anxious. She is "obsessed" with the news (social media) and remains in a constant state of stress. She wants to learn to be less reactive. Will initiate behaviorally oriented individual therapy to address symptoms. Goal date is 10-04-21. While patient has realized some improvement in level of anxiety, she still expresses considerable stress and anxiety. Continues to need better balance in life. Revised date is 12-23.  Patient agrees to a video Webex video session due to Covid 19. She is at home and I am at my home office.   Shilo says that it is more difficult to get in to Webex lately. States that she did not sleep well because she was having service call to her house. She talked about her chronic knee pain. She has been doing physical therapy, which helps. She does not want to rely on pills to help her pain.  She says that she misses being with other people, but will not go to inside events. She and her sister are staying in and watching  lot of television.      [name], PhD  Time: 3:10-4:00 50 minutes

## 2021-04-06 ENCOUNTER — Other Ambulatory Visit: Payer: Self-pay

## 2021-04-06 ENCOUNTER — Ambulatory Visit: Payer: Medicare PPO | Attending: Family Medicine | Admitting: Physical Therapy

## 2021-04-06 ENCOUNTER — Encounter: Payer: Self-pay | Admitting: Physical Therapy

## 2021-04-06 DIAGNOSIS — M6281 Muscle weakness (generalized): Secondary | ICD-10-CM | POA: Diagnosis not present

## 2021-04-06 DIAGNOSIS — M25661 Stiffness of right knee, not elsewhere classified: Secondary | ICD-10-CM | POA: Insufficient documentation

## 2021-04-06 DIAGNOSIS — I10 Essential (primary) hypertension: Secondary | ICD-10-CM | POA: Diagnosis not present

## 2021-04-06 DIAGNOSIS — M25662 Stiffness of left knee, not elsewhere classified: Secondary | ICD-10-CM | POA: Diagnosis not present

## 2021-04-06 DIAGNOSIS — Z9181 History of falling: Secondary | ICD-10-CM | POA: Diagnosis not present

## 2021-04-06 DIAGNOSIS — R296 Repeated falls: Secondary | ICD-10-CM | POA: Diagnosis not present

## 2021-04-06 DIAGNOSIS — M25562 Pain in left knee: Secondary | ICD-10-CM | POA: Insufficient documentation

## 2021-04-06 DIAGNOSIS — M25561 Pain in right knee: Secondary | ICD-10-CM | POA: Diagnosis not present

## 2021-04-06 DIAGNOSIS — G8929 Other chronic pain: Secondary | ICD-10-CM | POA: Insufficient documentation

## 2021-04-06 DIAGNOSIS — R011 Cardiac murmur, unspecified: Secondary | ICD-10-CM | POA: Diagnosis not present

## 2021-04-06 NOTE — Therapy (Signed)
OUTPATIENT PHYSICAL THERAPY TREATMENT NOTE   Patient Name: [name] MRN: [id number] DOB:1945/09/18, 76 y.o., female Today's Date: 04/06/2021  PCP: Jacelyn Pi, Lilia Argue, MD REFERRING PROVIDER: Donald Prose, MD   PT End of Session - 04/06/21 1637     Visit Number 7    Number of Visits 12    Date for PT Re-Evaluation 04/22/21    Authorization Type [employer]    PT Start Time 1601    PT Stop Time 0932    PT Time Calculation (min) 47 min    Activity Tolerance Patient tolerated treatment well    Behavior During Therapy [facility] for tasks assessed/performed               Past Medical History:  Diagnosis Date   Abnormal perimenopausal bleeding 2005   Allergy    Anemia    Anxiety    Dysuria 2004   Endometrial polyp 2006   Environmental allergies    Fibroid 02/19/03   H/O varicella    History of measles, mumps, or rubella    Hx of colonic polyps 12/15/2010   Hypertension 07-19-2010   echo for pre-op EF 55%  normal left wall thickness LA mildly dialated with mitral and tricuspid regurgatation   Hypothyroidism    Interstitial cystitis    Menopausal symptoms 2004   Obesity    Osteopenia 02/2012   -1.8 T score right femur neck   PMB (postmenopausal bleeding) 06/21/10   Sleep apnea    Urinary incontinence 2011   Vitamin D deficiency    history of   Past Surgical History:  Procedure Laterality Date   COLONOSCOPY  12/15/10   HYSTEROSCOPY  2006 and May 2012   for fibroids, endometrial polyps   [facility]   Patient Active Problem List   Diagnosis Date Noted   Cyst of skin 10/02/2018   Obstructive sleep apnea treated with continuous positive airway pressure (CPAP) 07/02/2017   Osteopenia 09/01/2016   Urinary incontinence in female 09/01/2016   Vitamin D deficiency 09/01/2016   BMI 35.0-35.9,adult 11/16/2015   Essential hypertension, benign 04/28/2012   Osteoarthritis of left knee 04/28/2012   Hypothyroidism 04/28/2012   Hx of  colonic polyps 12/15/2010   Anxiety 11/17/2010    REFERRING DIAG: M25.569 (ICD-10-CM) - Pain in unspecified knee   THERAPY DIAG:  Chronic pain of left knee  Chronic pain of right knee  Repeated falls  Muscle weakness (generalized)  History of falling  Stiffness of left knee, not elsewhere classified  Stiffness of right knee, not elsewhere classified  ONSET DATE: chronic , Sept. 2022, fall    SUBJECTIVE:    SUBJECTIVE STATEMENT: I am tired.  I have been going all day.  I did my exercises this AM.   PERTINENT HISTORY: Patient was here for PT for falls in 2021 with some improved mobility.  She had knee pain at that time.  HTN, Osteopenia   PAIN:  Are you having pain? none at rest  VAS scale: 4/10 with transfers  Pain location: Rt > Lt knees  Pain orientation: Bilateral and anterior  PAIN TYPE: aching Pain description: aching  Aggravating factors: transfers ,walking  Relieving factors: rest    PRECAUTIONS: None   WEIGHT BEARING RESTRICTIONS No  OBJECTIVE:   Unless otherwise noted all objective measures were performed on initial evaluation.* DIAGNOSTIC FINDINGS: none available recent states she has had one after her fall    PATIENT SURVEYS:  FOTO 36%  COGNITION:          Overall cognitive status: Within functional limits for tasks assessed                        SENSATION:          Light touch: Appears intact          Stereognosis: Appears intact          Hot/Cold: Appears intact          Proprioception: Appears intact     POSTURE:  Stands with wide BOS, genu valgus   PALPATION: Tender to palpation medial knee R>L as well as laterally. Normal patella mobility.    LE AROM/PROM:   A/PROM Right 03/11/2021 Left 03/11/2021  Hip flexion      Hip extension      Hip abduction      Hip adduction      Hip internal rotation      Hip external rotation      Knee flexion 120 115  Knee extension 0 0  Ankle dorsiflexion      Ankle plantarflexion      Ankle  inversion      Ankle eversion       (Blank rows = not tested)   LE MMT:   MMT Right 03/11/2021 Left 03/11/2021  Hip flexion 4/5 4/5  Hip extension      Hip abduction 4/5 NT due to pain, dizzy  Hip adduction      Hip internal rotation      Hip external rotation      Knee flexion 4/5 4/5  Knee extension 4/5 4/5  Ankle dorsiflexion 5/5 5/5   Ankle plantarflexion      Ankle inversion      Ankle eversion       (Blank rows = not tested)   LOWER EXTREMITY SPECIAL TESTS: NT   FUNCTIONAL TESTS:  5 times sit to stand: 11 sec , poor control an balance, painful  2 minute walk test: 372 feet , pain 5/10.                                Pt with dyspnea, pain and anxiety     TODAY'S TREATMENT:   [facility] Adult PT Treatment:                                                DATE: 04/06/21 Therapeutic Exercise: Nustep LE and UE 6 min  Bilateral quad sets  SLR x 10 toes up SLR x 10 toes out  Physio ball : hamstring curl 2 x 10 , ball bridges 2 x 10  Core oblique rotation and ball press UEs  Sit to stand 8 lbs x 15 Standing forward raise 4 lbs x 10 alternating arms Bicep curl 4 lbs x 15  Heel raise 2 x 4 lbs x 10  Self Care: Ice pack 10 min    [facility] Adult PT Treatment:                                                DATE: 04/04/21 Therapeutic Exercise: NuStep LE and  UE for ROM and warm up  Standing balance assessment on Airex  Narrow, wide, EO, EC and head turns, tandem each LE x 30 sec static   Step ups 6 inch x 20 bilateral UEs  Airex squats x 15 pain 5/10.  Step ups 4 inch x 10, then step down x 10 , unable to do due to Rt knee pain  Seated LAQ 3 lbs x 15 slow, late phase load, hold Hip abduction x 10 x 2 sets  SLR x 10 x 1 set with 3 lbs    [facility] Adult PT Treatment/Exercise:________________________1/23/23   Therapeutic Exercise: Supine SLR x 10  Bridge x 10 , unable to do this at home due to soft mattress Hip abduction x 10 x 2  Clam x 15 red band  Sit to stand x 15, 2 different  hgts 4 inch step ups x 10 reps UE assist needed  Hip extension at counter top 2 x 10  Doorway hip hinging x 10 min cues needed.  Pt took time to use the bathroom  NuStep LE only for 5 min L 6 added UE for reducing discomfort    PATIENT EDUCATION:  Education details: Updated and reviewed HEP Person educated: Patient Education method: Verbal, demonstration, handout Education comprehension: Verbalized understanding, returned demonstration     HOME EXERCISE PROGRAM: Access Code: [email or web address] URL: [email or web address] Date: 03/11/2021 Prepared by: Raeford Razor   Exercises Seated Long Arc Quad - 2 x daily - 7 x weekly - 2 sets - 10 reps - 5 hold Supine Active Straight Leg Raise - 2 x daily - 7 x weekly - 2 sets - 10 reps - 5 hold Seated Hamstring Stretch - 2 x daily - 7 x weekly - 1 sets - 3 reps - 30 hold Sidelying Hip Abduction - 2 x daily - 7 x weekly - 2 sets - 10 reps - 5 hold  Added 03/25/2021: Standing ITB Stretch - 1 x daily - 7 x weekly - 3 sets - 30 hold Bridge with Hip Abduction and Resistance - 1 x daily - 7 x weekly - 3 sets - 10 reps - 3-sec hold     ASSESSMENT:   CLINICAL IMPRESSION: Pt continues to understand benefit of PT for her knees.  She was tired but wanted to push through and finish the session.  She would like to address her balance a bit net time, having trouble "tipping over" when turning in her home, when in tight spaces.    REHAB POTENTIAL: Excellent   CLINICAL DECISION MAKING: Stable/uncomplicated   EVALUATION COMPLEXITY: Low     GOALS: Goals reviewed with patient? No           LONG TERM GOALS:    LTG Name Target Date Goal status  1 Pt will be I with HEP for bilateral knee ROM and strength Baseline: Achieved 03/25/2021 04/22/2021 ACHIEVED  2 Pt will be able to get up from sofa, chair and toilet without assist > 75% of the time, pain < 3/10 Baseline: 04/22/2021 ongoing  3 Pt will be able to score   55% or better on FOTO to demo  improved functional mobility  Baseline: 04/22/2021 Ongoing  4 Pt will be able to negotiate > 6 steps with only 1 rail, mod I in clinic and at sister's home  Baseline: 04/22/2021 ongoing  5 Pt will be able to report less pain overall with household tasks, ADLs, 25% improved or more  Baseline: 04/22/2021 Ongoing  PLAN: PT FREQUENCY: 2 x per week    PT DURATION: 6 weeks   PLANNED INTERVENTIONS: Therapeutic exercises, Therapeutic activity, Neuro Muscular re-education, Balance training, Gait training, Patient/Family education, Joint mobilization, Dry Needling, Cryotherapy, Moist heat, Taping, Ionotophoresis 4mg /ml Dexamethasone, and Manual therapy   PLAN FOR NEXT SESSION: step ups and curbs, DGI, Saralyn Pilar, PT 04/06/21 4:37 PM Phone: [phone] Fax: [phone]

## 2021-04-12 ENCOUNTER — Ambulatory Visit: Payer: Medicare PPO | Admitting: Internal Medicine

## 2021-04-13 ENCOUNTER — Ambulatory Visit: Payer: Medicare PPO | Admitting: Physical Therapy

## 2021-04-13 NOTE — Therapy (Incomplete)
OUTPATIENT PHYSICAL THERAPY TREATMENT NOTE   Patient Name: [name] MRN: [id number] DOB:09/20/1945, 76 y.o., female Today's Date: 04/13/2021  PCP: Jacelyn Pi, Lilia Argue, MD REFERRING PROVIDER: Donald Prose, MD     REFERRING DIAG: [phone] (ICD-10-CM) - Pain in unspecified knee   THERAPY DIAG:  No diagnosis found.  ONSET DATE: chronic , Sept. 2022, fall    SUBJECTIVE:    SUBJECTIVE STATEMENT:   ***   PERTINENT HISTORY: Patient was here for PT for falls in 2021 with some improved mobility.  She had knee pain at that time.  HTN, Osteopenia   PAIN:  Are you having pain? none at rest  VAS scale: 4/10 with transfers  Pain location: Rt > Lt knees  Pain orientation: Bilateral and anterior  PAIN TYPE: aching Pain description: aching  Aggravating factors: transfers ,walking  Relieving factors: rest    PRECAUTIONS: None   WEIGHT BEARING RESTRICTIONS No  OBJECTIVE:   Unless otherwise noted all objective measures were performed on initial evaluation.* DIAGNOSTIC FINDINGS: none available recent states she has had one after her fall    PATIENT SURVEYS:  FOTO 36%   COGNITION:          Overall cognitive status: Within functional limits for tasks assessed                        SENSATION:          Light touch: Appears intact          Stereognosis: Appears intact          Hot/Cold: Appears intact          Proprioception: Appears intact     POSTURE:  Stands with wide BOS, genu valgus   PALPATION: Tender to palpation medial knee R>L as well as laterally. Normal patella mobility.    LE AROM/PROM:   A/PROM Right 03/11/2021 Left 03/11/2021  Hip flexion      Hip extension      Hip abduction      Hip adduction      Hip internal rotation      Hip external rotation      Knee flexion 120 115  Knee extension 0 0  Ankle dorsiflexion      Ankle plantarflexion      Ankle inversion      Ankle eversion       (Blank rows = not tested)   LE MMT:   MMT  Right 03/11/2021 Left 03/11/2021  Hip flexion 4/5 4/5  Hip extension      Hip abduction 4/5 NT due to pain, dizzy  Hip adduction      Hip internal rotation      Hip external rotation      Knee flexion 4/5 4/5  Knee extension 4/5 4/5  Ankle dorsiflexion 5/5 5/5   Ankle plantarflexion      Ankle inversion      Ankle eversion       (Blank rows = not tested)   LOWER EXTREMITY SPECIAL TESTS: NT   FUNCTIONAL TESTS:  5 times sit to stand: 11 sec , poor control an balance, painful  2 minute walk test: 372 feet , pain 5/10.                                Pt with dyspnea, pain and anxiety     TODAY'S TREATMENT:  [facility]  Treatment:                                                DATE: 04/13/21 Therapeutic Exercise: *** Manual Therapy: *** Neuromuscular re-ed: *** Therapeutic Activity: *** Modalities: *** Self Care: ***   [facility] Adult PT Treatment:                                                DATE: 04/06/21 Therapeutic Exercise: Nustep LE and UE 6 min  Bilateral quad sets  SLR x 10 toes up SLR x 10 toes out  Physio ball : hamstring curl 2 x 10 , ball bridges 2 x 10  Core oblique rotation and ball press UEs  Sit to stand 8 lbs x 15 Standing forward raise 4 lbs x 10 alternating arms Bicep curl 4 lbs x 15  Heel raise 2 x 4 lbs x 10  Self Care: Ice pack 10 min    [facility] Adult PT Treatment:                                                DATE: 04/04/21 Therapeutic Exercise: NuStep LE and UE for ROM and warm up  Standing balance assessment on Airex  Narrow, wide, EO, EC and head turns, tandem each LE x 30 sec static   Step ups 6 inch x 20 bilateral UEs  Airex squats x 15 pain 5/10.  Step ups 4 inch x 10, then step down x 10 , unable to do due to Rt knee pain  Seated LAQ 3 lbs x 15 slow, late phase load, hold Hip abduction x 10 x 2 sets  SLR x 10 x 1 set with 3 lbs    [facility] Adult PT Treatment/Exercise:________________________1/23/23   Therapeutic Exercise: Supine SLR x 10   Bridge x 10 , unable to do this at home due to soft mattress Hip abduction x 10 x 2  Clam x 15 red band  Sit to stand x 15, 2 different hgts 4 inch step ups x 10 reps UE assist needed  Hip extension at counter top 2 x 10  Doorway hip hinging x 10 min cues needed.  Pt took time to use the bathroom  NuStep LE only for 5 min L 6 added UE for reducing discomfort    PATIENT EDUCATION:  Education details: Updated and reviewed HEP Person educated: Patient Education method: Verbal, demonstration, handout Education comprehension: Verbalized understanding, returned demonstration     HOME EXERCISE PROGRAM: Access Code: [email or web address] URL: [email or web address] Date: 03/11/2021 Prepared by: Raeford Razor   Exercises Seated Long Arc Quad - 2 x daily - 7 x weekly - 2 sets - 10 reps - 5 hold Supine Active Straight Leg Raise - 2 x daily - 7 x weekly - 2 sets - 10 reps - 5 hold Seated Hamstring Stretch - 2 x daily - 7 x weekly - 1 sets - 3 reps - 30 hold Sidelying Hip Abduction - 2 x daily - 7 x weekly - 2 sets - 10 reps - 5 hold  Added 03/25/2021: Standing ITB Stretch -  1 x daily - 7 x weekly - 3 sets - 30 hold Bridge with Hip Abduction and Resistance - 1 x daily - 7 x weekly - 3 sets - 10 reps - 3-sec hold     ASSESSMENT:   CLINICAL IMPRESSION: Pt continues to understand benefit of PT for her knees.  She was tired but wanted to push through and finish the session.  She would like to address her balance a bit net time, having trouble "tipping over" when turning in her home, when in tight spaces.    REHAB POTENTIAL: Excellent   CLINICAL DECISION MAKING: Stable/uncomplicated   EVALUATION COMPLEXITY: Low     GOALS: Goals reviewed with patient? No           LONG TERM GOALS:    LTG Name Target Date Goal status  1 Pt will be I with HEP for bilateral knee ROM and strength Baseline: Achieved 03/25/2021 04/22/2021 ACHIEVED  2 Pt will be able to get up from sofa, chair and  toilet without assist > 75% of the time, pain < 3/10 Baseline: 04/22/2021 ongoing  3 Pt will be able to score   55% or better on FOTO to demo improved functional mobility  Baseline: 04/22/2021 Ongoing  4 Pt will be able to negotiate > 6 steps with only 1 rail, mod I in clinic and at sister's home  Baseline: 04/22/2021 ongoing  5 Pt will be able to report less pain overall with household tasks, ADLs, 25% improved or more  Baseline: 04/22/2021 Ongoing                       PLAN: PT FREQUENCY: 2 x per week    PT DURATION: 6 weeks   PLANNED INTERVENTIONS: Therapeutic exercises, Therapeutic activity, Neuro Muscular re-education, Balance training, Gait training, Patient/Family education, Joint mobilization, Dry Needling, Cryotherapy, Moist heat, Taping, Ionotophoresis 4mg /ml Dexamethasone, and Manual therapy   PLAN FOR NEXT SESSION: step ups and curbs, DGI, Saralyn Pilar, PT 04/13/21 6:03 AM Phone: [phone] Fax: [phone]

## 2021-04-15 ENCOUNTER — Encounter: Payer: Self-pay | Admitting: Physical Therapy

## 2021-04-15 ENCOUNTER — Other Ambulatory Visit: Payer: Self-pay

## 2021-04-15 ENCOUNTER — Ambulatory Visit: Payer: Medicare PPO | Admitting: Physical Therapy

## 2021-04-15 DIAGNOSIS — M25561 Pain in right knee: Secondary | ICD-10-CM

## 2021-04-15 DIAGNOSIS — M25661 Stiffness of right knee, not elsewhere classified: Secondary | ICD-10-CM

## 2021-04-15 DIAGNOSIS — R296 Repeated falls: Secondary | ICD-10-CM | POA: Diagnosis not present

## 2021-04-15 DIAGNOSIS — M25562 Pain in left knee: Secondary | ICD-10-CM

## 2021-04-15 DIAGNOSIS — M6281 Muscle weakness (generalized): Secondary | ICD-10-CM | POA: Diagnosis not present

## 2021-04-15 DIAGNOSIS — M25662 Stiffness of left knee, not elsewhere classified: Secondary | ICD-10-CM | POA: Diagnosis not present

## 2021-04-15 DIAGNOSIS — G8929 Other chronic pain: Secondary | ICD-10-CM

## 2021-04-15 DIAGNOSIS — Z9181 History of falling: Secondary | ICD-10-CM | POA: Diagnosis not present

## 2021-04-15 NOTE — Therapy (Signed)
OUTPATIENT PHYSICAL THERAPY TREATMENT NOTE   Patient Name: [name] MRN: [id number] DOB:10/27/1945, 76 y.o., female Today's Date: 04/15/2021  PCP: Jacelyn Pi, Lilia Argue, MD REFERRING PROVIDER: Donald Prose, MD   PT End of Session - 04/15/21 1154     Visit Number 8    Number of Visits 12    Date for PT Re-Evaluation 04/22/21    Authorization Type [employer]    PT Start Time 1150    PT Stop Time 1230    PT Time Calculation (min) 40 min               Past Medical History:  Diagnosis Date   Abnormal perimenopausal bleeding 2005   Allergy    Anemia    Anxiety    Dysuria 2004   Endometrial polyp 2006   Environmental allergies    Fibroid 02/19/03   H/O varicella    History of measles, mumps, or rubella    Hx of colonic polyps 12/15/2010   Hypertension 07-19-2010   echo for pre-op EF 55%  normal left wall thickness LA mildly dialated with mitral and tricuspid regurgatation   Hypothyroidism    Interstitial cystitis    Menopausal symptoms 2004   Obesity    Osteopenia 02/2012   -1.8 T score right femur neck   PMB (postmenopausal bleeding) 06/21/10   Sleep apnea    Urinary incontinence 2011   Vitamin D deficiency    history of   Past Surgical History:  Procedure Laterality Date   COLONOSCOPY  12/15/10   HYSTEROSCOPY  2006 and May 2012   for fibroids, endometrial polyps   [place]   Patient Active Problem List   Diagnosis Date Noted   Cyst of skin 10/02/2018   Obstructive sleep apnea treated with continuous positive airway pressure (CPAP) 07/02/2017   Osteopenia 09/01/2016   Urinary incontinence in female 09/01/2016   Vitamin D deficiency 09/01/2016   BMI 35.0-35.9,adult 11/16/2015   Essential hypertension, benign 04/28/2012   Osteoarthritis of left knee 04/28/2012   Hypothyroidism 04/28/2012   Hx of colonic polyps 12/15/2010   Anxiety 11/17/2010    REFERRING DIAG: M25.569 (ICD-10-CM) - Pain in unspecified knee    THERAPY DIAG:  Chronic pain of left knee  Chronic pain of right knee  Repeated falls  Muscle weakness (generalized)  History of falling  Stiffness of left knee, not elsewhere classified  Stiffness of right knee, not elsewhere classified  ONSET DATE: chronic , Sept. 2022, fall    SUBJECTIVE:    SUBJECTIVE STATEMENT:   I am feeling better. I get these sore throats and I am not sure why.  I am nervous about going to the cardiologist.     PERTINENT HISTORY: Patient was here for PT for falls in 2021 with some improved mobility.  She had knee pain at that time.  HTN, Osteopenia   PAIN:  Are you having pain? none at rest  VAS scale: 2/10 with transfers  Pain location: Rt > Lt knees  Pain orientation: Bilateral and anterior  PAIN TYPE: aching Pain description: aching  Aggravating factors: transfers ,walking  Relieving factors: rest    PRECAUTIONS: None   WEIGHT BEARING RESTRICTIONS No  OBJECTIVE:   Unless otherwise noted all objective measures were performed on initial evaluation.   DIAGNOSTIC FINDINGS: none available recent states she has had one after her fall    PATIENT SURVEYS:  FOTO 36%,  04/15/21:  59% (predicted 57% )  COGNITION:          Overall cognitive status: Within functional limits for tasks assessed                        SENSATION:          Light touch: Appears intact          Stereognosis: Appears intact          Hot/Cold: Appears intact          Proprioception: Appears intact     POSTURE:  Stands with wide BOS, genu valgus   PALPATION: Tender to palpation medial knee R>L as well as laterally. Normal patella mobility.    LE AROM/PROM:   A/PROM Right 03/11/2021 Left 03/11/2021  Hip flexion      Hip extension      Hip abduction      Hip adduction      Hip internal rotation      Hip external rotation      Knee flexion 120 115  Knee extension 0 0  Ankle dorsiflexion      Ankle plantarflexion      Ankle inversion      Ankle  eversion       (Blank rows = not tested)   LE MMT:   MMT Right 03/11/2021 Left 03/11/2021 04/15/21 04/15/21  Hip flexion 4/5 4/5    Hip extension        Hip abduction 4/5 NT due to pain, dizzy    Hip adduction        Hip internal rotation        Hip external rotation        Knee flexion 4/5 4/5    Knee extension 4/5 4/5    Ankle dorsiflexion 5/5 5/5     Ankle plantarflexion        Ankle inversion        Ankle eversion         (Blank rows = not tested)   LOWER EXTREMITY SPECIAL TESTS: NT   FUNCTIONAL TESTS:  5 times sit to stand: 11 sec , poor control an balance, painful  2 minute walk test: 372 feet , pain 5/10.                                Pt with dyspnea, pain and anxiety     TODAY'S TREATMENT:    [facility] Adult PT Treatment:                                                DATE: 04/15/21 Therapeutic Exercise: Sit to stand x 15  LAQ x 20  SLR x 2 x 10 toes up, toes out  Bridging  x 10  Standing at wall mini squat 10 sec x 5  Squats at countertop felt more secure to patient  Hip abduction 3 lbs cuff wgt. X 10  Hip extension 3 bs 2 x 10   [facility] Adult PT Treatment:                                                DATE: 04/06/21 Therapeutic Exercise:  Nustep LE and UE 6 min  Bilateral quad sets  SLR x 10 toes up SLR x 10 toes out  Physio ball : hamstring curl 2 x 10 , ball bridges 2 x 10  Core oblique rotation and ball press UEs  Sit to stand 8 lbs x 15 Standing forward raise 4 lbs x 10 alternating arms Bicep curl 4 lbs x 15  Heel raise 2 x 4 lbs x 10  Self Care: Ice pack 10 min       PATIENT EDUCATION:  Education details: Updated and reviewed HEP Person educated: Patient Education method: Verbal, demonstration, handout Education comprehension: Verbalized understanding, returned demonstration     HOME EXERCISE PROGRAM: Access Code: [email or web address] URL: [email or web address] Date: 03/11/2021 Prepared by: Raeford Razor   Exercises Seated Long Arc Quad - 2  x daily - 7 x weekly - 2 sets - 10 reps - 5 hold Supine Active Straight Leg Raise - 2 x daily - 7 x weekly - 2 sets - 10 reps - 5 hold Seated Hamstring Stretch - 2 x daily - 7 x weekly - 1 sets - 3 reps - 30 hold Sidelying Hip Abduction - 2 x daily - 7 x weekly - 2 sets - 10 reps - 5 hold  Added 03/25/2021: Standing ITB Stretch - 1 x daily - 7 x weekly - 3 sets - 30 hold Bridge with Hip Abduction and Resistance - 1 x daily - 7 x weekly - 3 sets - 10 reps - 3-sec hold  Step overs   ASSESSMENT:   CLINICAL IMPRESSION: FOTO score is improved and has actually surpassed goal.  She was able to get up from the lowest position with good control. She was asked to measure her seat hgt at home to compare. She says she is still having trouble getting up from her chair in her dining room.  She has a few more visits left to further provide education and LE strength, strategies for balance.    REHAB POTENTIAL: Excellent   CLINICAL DECISION MAKING: Stable/uncomplicated   EVALUATION COMPLEXITY: Low     GOALS: Goals reviewed with patient? No           LONG TERM GOALS:    LTG Name Target Date Goal status  1 Pt will be I with HEP for bilateral knee ROM and strength Baseline: Achieved 03/25/2021 04/22/2021 ACHIEVED 2/10  2 Pt will be able to get up from sofa, chair and toilet without assist > 75% of the time, pain < 3/10 Baseline: > 50% of the time 04/22/2021 ongoing  3 Pt will be able to score   55% or better on FOTO to demo improved functional mobility  Baseline: 04/22/2021 ACHIEVED 2/10  4 Pt will be able to negotiate > 6 steps with only 1 rail, mod I in clinic and at sister's home  Baseline: 04/22/2021 ongoing  5 Pt will be able to report less pain overall with household tasks, ADLs, 25% improved or more  Baseline: 04/22/2021 ACHIEVED 2/10                      PLAN: PT FREQUENCY: 2 x per week    PT DURATION: 6 weeks   PLANNED INTERVENTIONS: Therapeutic exercises, Therapeutic activity,  Neuro Muscular re-education, Balance training, Gait training, Patient/Family education, Joint mobilization, Dry Needling, Cryotherapy, Moist heat, Taping, Ionotophoresis 4mg /ml Dexamethasone, and Manual therapy   PLAN FOR NEXT SESSION: step ups and curbs, DGI. Really needs assist with stepping up  and over. , stepping down very small    Raeford Razor, PT 04/15/21 12:25 PM Phone: [phone] Fax: [phone]   Raeford Razor, PT 04/15/21 12:16 PM Phone: [phone] Fax: [phone]

## 2021-04-18 ENCOUNTER — Ambulatory Visit
Admission: RE | Admit: 2021-04-18 | Discharge: 2021-04-18 | Disposition: A | Discharge status: Home / Self Care | Payer: Medicare PPO | Source: Ambulatory Visit | Attending: Family Medicine | Admitting: Family Medicine

## 2021-04-18 DIAGNOSIS — M8580 Other specified disorders of bone density and structure, unspecified site: Secondary | ICD-10-CM

## 2021-04-18 DIAGNOSIS — Z780 Asymptomatic menopausal state: Secondary | ICD-10-CM | POA: Diagnosis not present

## 2021-04-18 DIAGNOSIS — M85851 Other specified disorders of bone density and structure, right thigh: Secondary | ICD-10-CM | POA: Diagnosis not present

## 2021-04-19 ENCOUNTER — Ambulatory Visit (INDEPENDENT_AMBULATORY_CARE_PROVIDER_SITE_OTHER): Payer: Medicare PPO | Admitting: Psychology

## 2021-04-19 ENCOUNTER — Other Ambulatory Visit: Payer: Self-pay

## 2021-04-19 ENCOUNTER — Ambulatory Visit: Payer: Medicare PPO | Admitting: Physical Therapy

## 2021-04-19 DIAGNOSIS — M6281 Muscle weakness (generalized): Secondary | ICD-10-CM

## 2021-04-19 DIAGNOSIS — Z9181 History of falling: Secondary | ICD-10-CM

## 2021-04-19 DIAGNOSIS — F411 Generalized anxiety disorder: Secondary | ICD-10-CM | POA: Diagnosis not present

## 2021-04-19 DIAGNOSIS — G8929 Other chronic pain: Secondary | ICD-10-CM | POA: Diagnosis not present

## 2021-04-19 DIAGNOSIS — M25661 Stiffness of right knee, not elsewhere classified: Secondary | ICD-10-CM | POA: Diagnosis not present

## 2021-04-19 DIAGNOSIS — M25561 Pain in right knee: Secondary | ICD-10-CM | POA: Diagnosis not present

## 2021-04-19 DIAGNOSIS — M25662 Stiffness of left knee, not elsewhere classified: Secondary | ICD-10-CM | POA: Diagnosis not present

## 2021-04-19 DIAGNOSIS — R296 Repeated falls: Secondary | ICD-10-CM | POA: Diagnosis not present

## 2021-04-19 DIAGNOSIS — M25562 Pain in left knee: Secondary | ICD-10-CM

## 2021-04-19 NOTE — Progress Notes (Signed)
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 (Status: maintained) -- No Description Entered  Excessive and/or unrealistic worry that is difficult to control occurring more days than not for at least 6 months about a number of events or activities. (Status: maintained) -- No Description Entered  Medication Status compliance  Safety none  If Suicidal or Homicidal State Action Taken: unspecified  Current Risk: low Medications unspecified Objectives Related Problem: Resolve the core conflict that is the source of anxiety. Description: Describe situations, thoughts, feelings, and actions associated with anxieties and worries, their impact on functioning, and attempts to resolve them. Target Date: 2022-02-12 Frequency: Daily Modality: individual Progress: 60%  Related Problem: Resolve the core conflict that is the source of anxiety. Description: Learn and implement calming skills to reduce overall anxiety and manage anxiety symptoms. Target Date: 2022-02-12 Frequency: Daily Modality: individual Progress: 60%  Related Problem: Resolve the core conflict that is the source of anxiety. Description: Learn and implement problem-solving strategies for realistically addressing worries. Target Date: 2022-02-12 Frequency: Daily Modality: individual Progress: 60%  Related Problem: Resolve the core conflict that is the source of anxiety. Description: Maintain involvement in work, family, and social activities. Target Date: 2022-02-12 Frequency: Daily Modality: individual Progress: 50%  Related Problem: Restore restful sleep pattern. Description: Describe the history and details of sleep pattern. Target Date: 2021-03-15 Frequency: Daily Modality: individual Progress: 70%    Client Response full compliance  Service Location Location, [address] [place], [place] [address]  Service Code cpt [phone] P Self-monitoring  Lifestyle change (exercise, nutrition)  Self care activities  Distress tolerance skill  Identify/label emotions  Identify automatic thoughts  Facilitate problem solving  Normalize/Reframe  Behavioral activation plan  Validate/empathize  Comments  Dx.: Generalized Anxiety (F41.1)  Meds: No psychotropics Goals: [name] is seeking to develop a more satisfying life-style that takes into account her current physical condition. Goal date 11-04-20. States that the current situation in the world (politics and Covid 19) is making her very anxious. She is "obsessed" with the news (social media) and remains in a constant state of stress. She wants to learn to be less reactive. Will initiate behaviorally oriented individual therapy to address symptoms. Goal date is 10-04-21. While patient has realized some improvement in level of anxiety, she still expresses considerable stress and anxiety. Continues to need better balance in life. Revised date is 12-23.  Patient agrees to a video Webex video session due to Covid 19. She is at home and I am at my home office.   [name] had her bone scan today and the results were "scary" to her. Told she has degenerative disease, but it is not extreme. She says her mother had a broken back and [name] she she would have the same problem. She has rejected hormone replacement out of fear of cancer. She is in physical therapy and has achieved her initial goals.  She talked about growing older and her challenges. She continues to be upset with the current political state of affairs. She admits to watching too much television and spending too much time on twitter.        Joy Morel, PhD  Time: 3:10-4:00 50 minutes

## 2021-04-19 NOTE — Therapy (Signed)
OUTPATIENT PHYSICAL THERAPY TREATMENT NOTE   Patient Name: [name] MRN: [id number] DOB:07-23-1945, 76 y.o., female Today's Date: 04/19/2021  PCP: Jacelyn Pi, Lilia Argue, MD REFERRING PROVIDER: Jacelyn Pi, Lilia Argue, *   PT End of Session - 04/19/21 1029     Visit Number 9    Number of Visits 12    Date for PT Re-Evaluation 04/22/21    Authorization Type [employer]    PT Start Time 1020    PT Stop Time 1100    PT Time Calculation (min) 40 min    Activity Tolerance Patient tolerated treatment well    Behavior During Therapy [facility] for tasks assessed/performed;Anxious                Past Medical History:  Diagnosis Date   Abnormal perimenopausal bleeding 2005   Allergy    Anemia    Anxiety    Dysuria 2004   Endometrial polyp 2006   Environmental allergies    Fibroid 02/19/03   H/O varicella    History of measles, mumps, or rubella    Hx of colonic polyps 12/15/2010   Hypertension 07-19-2010   echo for pre-op EF 55%  normal left wall thickness LA mildly dialated with mitral and tricuspid regurgatation   Hypothyroidism    Interstitial cystitis    Menopausal symptoms 2004   Obesity    Osteopenia 02/2012   -1.8 T score right femur neck   PMB (postmenopausal bleeding) 06/21/10   Sleep apnea    Urinary incontinence 2011   Vitamin D deficiency    history of   Past Surgical History:  Procedure Laterality Date   COLONOSCOPY  12/15/10   HYSTEROSCOPY  2006 and May 2012   for fibroids, endometrial polyps   [place]   Patient Active Problem List   Diagnosis Date Noted   Cyst of skin 10/02/2018   Obstructive sleep apnea treated with continuous positive airway pressure (CPAP) 07/02/2017   Osteopenia 09/01/2016   Urinary incontinence in female 09/01/2016   Vitamin D deficiency 09/01/2016   BMI 35.0-35.9,adult 11/16/2015   Essential hypertension, benign 04/28/2012   Osteoarthritis of left knee 04/28/2012   Hypothyroidism  04/28/2012   Hx of colonic polyps 12/15/2010   Anxiety 11/17/2010    REFERRING DIAG: M25.569 (ICD-10-CM) - Pain in unspecified knee   THERAPY DIAG:  Chronic pain of left knee  Chronic pain of right knee  Repeated falls  Muscle weakness (generalized)  History of falling  Stiffness of left knee, not elsewhere classified  Stiffness of right knee, not elsewhere classified  ONSET DATE: chronic , Sept. 2022, fall    SUBJECTIVE:    SUBJECTIVE STATEMENT:  I was able to get off the toilet better the other day. I have some knee pain.  My chair hgt it 18 inch .  I want to be able to step over things but it hurts.     PERTINENT HISTORY: Patient was here for PT for falls in 2021 with some improved mobility.  She had knee pain at that time.  HTN, Osteopenia   PAIN:  Are you having pain? none at rest  VAS scale: 4/10 with standing exercises   Pain location: Rt > Lt knees  Pain orientation: Bilateral and anterior  PAIN TYPE: aching Pain description: aching  Aggravating factors: transfers ,walking  Relieving factors: rest    PRECAUTIONS: None   WEIGHT BEARING RESTRICTIONS No  OBJECTIVE:   Unless otherwise noted  all objective measures were performed on initial evaluation.   DIAGNOSTIC FINDINGS: none available recent states she has had one after her fall    PATIENT SURVEYS:  FOTO 36%,  04/15/21:  59% (predicted 57% )    COGNITION:          Overall cognitive status: Within functional limits for tasks assessed                        SENSATION:          Light touch: Appears intact          Stereognosis: Appears intact          Hot/Cold: Appears intact          Proprioception: Appears intact     POSTURE:  Stands with wide BOS, genu valgus   PALPATION: Tender to palpation medial knee R>L as well as laterally. Normal patella mobility.    LE AROM/PROM:   A/PROM Right 03/11/2021 Left 03/11/2021  Hip flexion      Hip extension      Hip abduction      Hip adduction       Hip internal rotation      Hip external rotation      Knee flexion 120 115  Knee extension 0 0  Ankle dorsiflexion      Ankle plantarflexion      Ankle inversion      Ankle eversion       (Blank rows = not tested)   LE MMT:   MMT Right 03/11/2021 Left 03/11/2021 04/19/21 04/19/21  Hip flexion 4/5 4/5 4/5 4/5  Hip extension        Hip abduction 4/5 NT due to pain, dizzy    Hip adduction        Hip internal rotation        Hip external rotation        Knee flexion 4/5 4/5 4+/5 4+/5  Knee extension 4/5 4/5 5/5 5/5  Ankle dorsiflexion 5/5 5/5     Ankle plantarflexion        Ankle inversion        Ankle eversion         (Blank rows = not tested)   LOWER EXTREMITY SPECIAL TESTS: NT   FUNCTIONAL TESTS:  5 times sit to stand: 11 sec , poor control an balance, painful  2 minute walk test: 372 feet , pain 5/10.                                Pt with dyspnea, pain and anxiety     TODAY'S TREATMENT:   [facility] Adult PT Treatment:                                                DATE: 04/19/21 Therapeutic Exercise: NuStep L4 UE and LE for 7 min  Standing balance in parallel bars Single leg high knee slow march  Heel raise x 10 with overhead arms Narrow stance head turns and nods Static single leg balance  Step ups 4 inch step forward x 10 and then lateral x 10  Step downs, 4 inch with pain  Curb negotiation with and without cane  SLR and knee extension on the mat x 10 each  Self Care:  Kasandra Knudsen, walking stick, gait balance and goals, DC plan    [facility] Adult PT Treatment:                                                DATE: 04/15/21 Therapeutic Exercise: Sit to stand x 15  LAQ x 20  SLR x 2 x 10 toes up, toes out  Bridging  x 10  Standing at wall mini squat 10 sec x 5  Squats at countertop felt more secure to patient  Hip abduction 3 lbs cuff wgt. X 10  Hip extension 3 bs 2 x 10   [facility] Adult PT Treatment:                                                DATE: 04/06/21 Therapeutic  Exercise: Nustep LE and UE 6 min  Bilateral quad sets  SLR x 10 toes up SLR x 10 toes out  Physio ball : hamstring curl 2 x 10 , ball bridges 2 x 10  Core oblique rotation and ball press UEs  Sit to stand 8 lbs x 15 Standing forward raise 4 lbs x 10 alternating arms Bicep curl 4 lbs x 15  Heel raise 2 x 4 lbs x 10  Self Care: Ice pack 10 min       PATIENT EDUCATION:  Education details: Updated and reviewed HEP Person educated: Patient Education method: Verbal, demonstration, handout Education comprehension: Verbalized understanding, returned demonstration     HOME EXERCISE PROGRAM: Access Code: [email or web address] URL: [email or web address] Date: 03/11/2021 Prepared by: Raeford Razor   Exercises Seated Long Arc Quad - 2 x daily - 7 x weekly - 2 sets - 10 reps - 5 hold Supine Active Straight Leg Raise - 2 x daily - 7 x weekly - 2 sets - 10 reps - 5 hold Seated Hamstring Stretch - 2 x daily - 7 x weekly - 1 sets - 3 reps - 30 hold Sidelying Hip Abduction - 2 x daily - 7 x weekly - 2 sets - 10 reps - 5 hold  Added 03/25/2021: Standing ITB Stretch - 1 x daily - 7 x weekly - 3 sets - 30 hold Bridge with Hip Abduction and Resistance - 1 x daily - 7 x weekly - 3 sets - 10 reps - 3-sec hold Step overs    ASSESSMENT:   CLINICAL IMPRESSION:  Patient had less pain when stepping over when she tried lifting the thigh vs bend the knee.  She was able to use a walking stick to step over a 4 inch step with mod I.  And did it without as well.  A supportive device did reduce pain and give her more confidence .  She needs constant encouragement during session.  She is stronger in her quads and hamstrings.  She will be DC after her next 1-2 visits.     REHAB POTENTIAL: Excellent   CLINICAL DECISION MAKING: Stable/uncomplicated   EVALUATION COMPLEXITY: Low     GOALS: Goals reviewed with patient? No           LONG TERM GOALS:    LTG Name Target Date Goal status  1 Pt will  be I with HEP for  bilateral knee ROM and strength Baseline: Achieved 03/25/2021 04/22/2021 ACHIEVED 2/10  2 Pt will be able to get up from sofa, chair and toilet without assist > 75% of the time, pain < 3/10 Baseline:I can't really get up  04/22/2021 ACHIEVED   3 Pt will be able to score   55% or better on FOTO to demo improved functional mobility  Baseline: 04/22/2021 ACHIEVED 2/10  4 Pt will be able to negotiate > 6 steps with only 1 rail, mod I in clinic and at sister's home  Baseline: 04/22/2021 ongoing  5 Pt will be able to report less pain overall with household tasks, ADLs, 25% improved or more  Baseline: 04/22/2021 ACHIEVED 2/10                      PLAN: PT FREQUENCY: 2 x per week    PT DURATION: 6 weeks   PLANNED INTERVENTIONS: Therapeutic exercises, Therapeutic activity, Neuro Muscular re-education, Balance training, Gait training, Patient/Family education, Joint mobilization, Dry Needling, Cryotherapy, Moist heat, Taping, Ionotophoresis 4mg /ml Dexamethasone, and Manual therapy   PLAN FOR NEXT SESSION: step ups and curbs, DGI. Really needs cane OR assist with stepping up and over. , stepping down very small steps, pain controlled with cane or HHA . DC vs 1 more.    Raeford Razor, PT 04/19/21 10:39 AM Phone: [phone] Fax: [phone]   Raeford Razor, PT 04/19/21 10:39 AM Phone: [phone] Fax: [phone]

## 2021-04-21 ENCOUNTER — Ambulatory Visit: Payer: Medicare PPO | Admitting: Internal Medicine

## 2021-04-21 ENCOUNTER — Other Ambulatory Visit: Payer: Self-pay

## 2021-04-21 ENCOUNTER — Encounter: Payer: Self-pay | Admitting: Internal Medicine

## 2021-04-21 VITALS — BP 142/58 | HR 76 | Ht 60.25 in | Wt 186.2 lb

## 2021-04-21 DIAGNOSIS — R011 Cardiac murmur, unspecified: Secondary | ICD-10-CM

## 2021-04-21 NOTE — Progress Notes (Addendum)
Cardiology Office Note:    Date:  04/21/2021   ID:  [name], DOB 24-Feb-1946, MRN [id number]  PCP:  [name], Joy Argue, MD   [facility] Providers Cardiologist:  Joy Mayo, MD     Referring MD: Joy Prose, MD   No chief complaint on file. Murmur  History of Present Illness:    [name] is a 76 y.o. female with a hx of anxiety, echo 07/19/2010 showed normal EF and no valve disease, referral 2/2 heart murmur noted during nurse visit for her health insurance  She saw [place] in 2012. She had an echo that showed normal EF and no valve disease. She has no hx if arrhythmia. No cardiac disease history. She has not seen a cardiologist since then.  She's had no hospital admissions. She denies chest pressure. With brisk walking and running to the bathroom she can get winded. She is doing physical therapy for her knees. She notes intermittent palpitations.She denies LH or syncope  It only lasts a couple of minutes. She notes anxiety. Her mother had HTN. No premature familial CAD. She denies smoking history. No DM2. She had hx of hypothyroidism. TSH is normal 1.2.  LDL 94 mg/dL  Past Medical History:  Diagnosis Date   Abnormal perimenopausal bleeding 2005   Allergy    Anemia    Anxiety    Dysuria 2004   Endometrial polyp 2006   Environmental allergies    Fibroid 02/19/03   H/O varicella    History of measles, mumps, or rubella    Hx of colonic polyps 12/15/2010   Hypertension 07-19-2010   echo for pre-op EF 55%  normal left wall thickness LA mildly dialated with mitral and tricuspid regurgatation   Hypothyroidism    Interstitial cystitis    Menopausal symptoms 2004   Obesity    Osteopenia 02/2012   -1.8 T score right femur neck   PMB (postmenopausal bleeding) 06/21/10   Sleep apnea    Urinary incontinence 2011   Vitamin D deficiency    history of    Past Surgical History:  Procedure Laterality Date   COLONOSCOPY  12/15/10   HYSTEROSCOPY   2006 and May 2012   for fibroids, endometrial polyps   MOUTH SURGERY     MYOMECTOMY  1974    Current Medications:  Current Outpatient Medications on File Prior to Visit  Medication Sig Dispense Refill   Ascorbic Acid (VITAMIN C) 1000 MG tablet Take 1,000 mg by mouth daily. One tablet a day     benazepril-hydrochlorthiazide (LOTENSIN HCT) 20-12.5 MG tablet Take 2 tablets by mouth daily. 180 tablet 1   cholecalciferol (VITAMIN D3) 25 MCG (1000 UNIT) tablet Take 2,000 Units by mouth daily.     hydrALAZINE (APRESOLINE) 25 MG tablet TAKE 2 TABLETS BY MOUTH EVERY MORNING, THEN 1 TABLET BY MOUTH AT LUNCH AND 2 TABLETS AT DINNER 450 tablet 3   levothyroxine (SYNTHROID) 112 MCG tablet TAKE 1 TABLET BY MOUTH EVERY DAY BEFORE BREAKFAST 90 tablet 1   liothyronine (CYTOMEL) 5 MCG tablet Take 1 tablet by mouth before breakfast 90 tablet 3   metoprolol succinate (TOPROL-XL) 25 MG 24 hr tablet TAKE 1 TABLET(25 MG) BY MOUTH DAILY 90 tablet 3   No current facility-administered medications on file prior to visit.      Allergies:   Bactrim [sulfamethoxazole-trimethoprim], Epinephrine, Erythromycin, and Sulfa antibiotics   Social History   Socioeconomic History   Marital status: Divorced    Spouse name:  n/a   Number of children: 0   Years of education: college   Highest education level: Not on file  Occupational History   Not on file  Tobacco Use   Smoking status: Never   Smokeless tobacco: Never  Substance and Sexual Activity   Alcohol use: Yes    Alcohol/week: 1.0 standard drink    Types: 1 Glasses of wine per week    Comment: occasional (1-2 times per week)   Drug use: No   Sexual activity: Not Currently    Birth control/protection: Abstinence  Other Topics Concern   Not on file  Social History Narrative   Lives alone.   Sister stays with her frequently.   Participates in [employer] exercise program.   Social Determinants of Health   Financial Resource Strain: Not on file   Food Insecurity: Not on file  Transportation Needs: Not on file  Physical Activity: Not on file  Stress: Not on file  Social Connections: Not on file     Family History: The patient's family history includes Benign prostatic hyperplasia in her father; Clotting disorder in her maternal grandmother; Colon cancer in her paternal uncle; Hypertension in her mother; Kidney disease in her father.  ROS:   Please see the history of present illness.     All other systems reviewed and are negative.  EKGs/Labs/Other Studies Reviewed:    The following studies were reviewed today:   EKG:  EKG is  ordered today.  The ekg ordered today demonstrates   NSR, poor R wave progression suggestive of old anterior infarct  Recent Labs: 07/16/2020: TSH 1.210  Recent Lipid Panel    Component Value Date/Time   CHOL 185 12/02/2014 1024   TRIG 132 12/02/2014 1024   HDL 46 12/02/2014 1024   CHOLHDL 4.0 12/02/2014 1024   VLDL 26 12/02/2014 1024   LDLCALC 113 12/02/2014 1024     Risk Assessment/Calculations:           Physical Exam:    VS:    Vitals:   04/21/21 1006  BP: (!) 142/58  Pulse: 76  SpO2: 95%     Wt Readings from Last 3 Encounters:  04/21/21 186 lb 3.2 oz (84.5 kg)  07/16/20 188 lb 9.6 oz (85.5 kg)  10/07/19 184 lb (83.5 kg)     GEN:  Well nourished, well developed in no acute distress HEENT: Normal NECK: No JVD; No carotid bruits LYMPHATICS: No lymphadenopathy CARDIAC: 3/6 SEM RUSB, no murmurs, rubs, gallops RESPIRATORY:  Clear to auscultation without rales, wheezing or rhonchi  ABDOMEN: Soft, non-tender, non-distended MUSCULOSKELETAL:  No edema; No deformity  SKIN: Warm and dry NEUROLOGIC:  Alert and oriented x 3 PSYCHIATRIC:  Normal affect   ASSESSMENT:    Systolic Ejection Murmur: SEM at RUSB, ddx includes aortic stenosis which is likely mild. She has no signs of severe AS, no diastolic murmur. Considering this, and that she is asymptomatic, further w/u would  not change her management.  HTN: Ambulatory BP monitor. She will send Her BPs via my chart to me. She is concerned about cost of consultant fees for follow up . She can continue current regimen for now, can uptitrate  her regimen based on ambulatory readings. 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 She is 67, it reasonable to not initiate a statin considering the aim is secondary long-term CVD risk mitigation.   PLAN:  In order of problems listed above:  Follow up PRN         Medication Adjustments/Labs and Tests Ordered: Current medicines are reviewed at length with the patient today.  Concerns regarding medicines are outlined above.  No orders of the defined types were placed in this encounter.  No orders of the defined types were placed in this encounter.   Patient Instructions  Medication Instructions:  No Changes In Medications at this time.  *If you need a refill on your cardiac medications before your next appointment, please call your pharmacy*  Follow-Up: At [facility], you and your health needs are our priority.  As part of our continuing mission to provide you with exceptional heart care, we have created designated Provider Care Teams.  These Care Teams include your primary Cardiologist (physician) and Advanced Practice Providers (APPs -  Physician Assistants and Nurse Practitioners) who all work together to provide you with the care you need, when you need it.  Your next appointment:   AS NEEDED   The format for your next appointment:   In Person  Provider:   Janina Mayo, MD     Signed, Joy Mayo, MD  04/21/2021 11:45 AM    [place]

## 2021-04-21 NOTE — Therapy (Signed)
OUTPATIENT PHYSICAL THERAPY TREATMENT NOTE/ Progress Note/ Discharge Summary   Progress Note Reporting Period 03/11/2021 to 04/22/2021  See note below for Objective Data and Assessment of Progress/Goals.      Patient Name: [name] MRN: [id number] DOB:16-Apr-1945, 76 y.o., female Today's Date: 04/22/2021  PCP: Jacelyn Pi, Lilia Argue, MD REFERRING PROVIDER: Jacelyn Pi, Lilia Argue, *   PT End of Session - 04/22/21 1000     Visit Number 10    Number of Visits 12    Date for PT Re-Evaluation 04/22/21    Authorization Type [employer]    PT Start Time 1000    PT Stop Time 1045    PT Time Calculation (min) 45 min    Equipment Utilized During Treatment Gait belt    Activity Tolerance Patient tolerated treatment well    Behavior During Therapy [facility] for tasks assessed/performed                 Past Medical History:  Diagnosis Date   Abnormal perimenopausal bleeding 2005   Allergy    Anemia    Anxiety    Dysuria 2004   Endometrial polyp 2006   Environmental allergies    Fibroid 02/19/03   H/O varicella    History of measles, mumps, or rubella    Hx of colonic polyps 12/15/2010   Hypertension 07-19-2010   echo for pre-op EF 55%  normal left wall thickness LA mildly dialated with mitral and tricuspid regurgatation   Hypothyroidism    Interstitial cystitis    Menopausal symptoms 2004   Obesity    Osteopenia 02/2012   -1.8 T score right femur neck   PMB (postmenopausal bleeding) 06/21/10   Sleep apnea    Urinary incontinence 2011   Vitamin D deficiency    history of   Past Surgical History:  Procedure Laterality Date   COLONOSCOPY  12/15/10   HYSTEROSCOPY  2006 and May 2012   for fibroids, endometrial polyps   [place]   Patient Active Problem List   Diagnosis Date Noted   Cyst of skin 10/02/2018   Obstructive sleep apnea treated with continuous positive airway pressure (CPAP) 07/02/2017   Osteopenia 09/01/2016    Urinary incontinence in female 09/01/2016   Vitamin D deficiency 09/01/2016   BMI 35.0-35.9,adult 11/16/2015   Essential hypertension, benign 04/28/2012   Osteoarthritis of left knee 04/28/2012   Hypothyroidism 04/28/2012   Hx of colonic polyps 12/15/2010   Anxiety 11/17/2010    REFERRING DIAG: M25.569 (ICD-10-CM) - Pain in unspecified knee   THERAPY DIAG:  Chronic pain of left knee  Chronic pain of right knee  Repeated falls  Muscle weakness (generalized)  History of falling  Stiffness of left knee, not elsewhere classified  Stiffness of right knee, not elsewhere classified  ONSET DATE: chronic , Sept. 2022, fall    SUBJECTIVE:    SUBJECTIVE STATEMENT:  Pt reports that she is ready for discharge today to independently progress her home exercises and maintenance program. She rates her BIL knee pain today as a 3/10, which she reports has increased today with the change in weather.    PERTINENT HISTORY: Patient was here for PT for falls in 2021 with some improved mobility.  She had knee pain at that time.  HTN, Osteopenia   PAIN:  Are you having pain? none at rest  VAS scale: 3/10 with standing exercises   Pain location: Rt > Lt knees  Pain orientation:  Bilateral and anterior  PAIN TYPE: aching Pain description: aching  Aggravating factors: transfers ,walking  Relieving factors: rest    PRECAUTIONS: None   WEIGHT BEARING RESTRICTIONS No  OBJECTIVE:   Unless otherwise noted all objective measures were performed on initial evaluation.   DIAGNOSTIC FINDINGS: none available recent states she has had one after her fall    PATIENT SURVEYS:  FOTO 36%,  04/15/21:  59% (predicted 57% )    COGNITION:          Overall cognitive status: Within functional limits for tasks assessed                        SENSATION:          Light touch: Appears intact          Stereognosis: Appears intact          Hot/Cold: Appears intact          Proprioception: Appears intact      POSTURE:  Stands with wide BOS, genu valgus   PALPATION: Tender to palpation medial knee R>L as well as laterally. Normal patella mobility.    LE AROM/PROM:   A/PROM Right 03/11/2021 Left 03/11/2021  Hip flexion      Hip extension      Hip abduction      Hip adduction      Hip internal rotation      Hip external rotation      Knee flexion 120 115  Knee extension 0 0  Ankle dorsiflexion      Ankle plantarflexion      Ankle inversion      Ankle eversion       (Blank rows = not tested)   LE MMT:   MMT Right 03/11/2021 Left 03/11/2021 04/19/21 04/19/21  Hip flexion 4/5 4/5 4/5 4/5  Hip extension        Hip abduction 4/5 NT due to pain, dizzy    Hip adduction        Hip internal rotation        Hip external rotation        Knee flexion 4/5 4/5 4+/5 4+/5  Knee extension 4/5 4/5 5/5 5/5  Ankle dorsiflexion 5/5 5/5     Ankle plantarflexion        Ankle inversion        Ankle eversion         (Blank rows = not tested)   LOWER EXTREMITY SPECIAL TESTS: NT   FUNCTIONAL TESTS:  5 times sit to stand: 11 sec , poor control an balance, painful  2 minute walk test: 372 feet , pain 5/10.                                Pt with dyspnea, pain and anxiety     TODAY'S TREATMENT:  [facility] Adult PT Treatment:                                                DATE: 04/22/2021 Therapeutic Exercise: N/A Manual Therapy: N/A Neuromuscular re-ed: Standing balance clocks 2x8 BIL Tandem stance with rocking 3x30 sec BIL Therapeutic Activity: Updated and reviewed HEP, issued introductory walking program, instructed to pursue Tai Chi for arthritic pain relief, educated on objective progress  made in PT to this point Functional squat 3x8 Modalities: N/A Self Care: N/A   [facility] Adult PT Treatment:                                                DATE: 04/19/21 Therapeutic Exercise: NuStep L4 UE and LE for 7 min  Standing balance in parallel bars Single leg high knee slow march  Heel raise x 10  with overhead arms Narrow stance head turns and nods Static single leg balance  Step ups 4 inch step forward x 10 and then lateral x 10  Step downs, 4 inch with pain  Curb negotiation with and without cane  SLR and knee extension on the mat x 10 each  Self Care:  Cane, walking stick, gait balance and goals, DC plan    [facility] Adult PT Treatment:                                                DATE: 04/15/21 Therapeutic Exercise: Sit to stand x 15  LAQ x 20  SLR x 2 x 10 toes up, toes out  Bridging  x 10  Standing at wall mini squat 10 sec x 5  Squats at countertop felt more secure to patient  Hip abduction 3 lbs cuff wgt. X 10  Hip extension 3 bs 2 x 10      PATIENT EDUCATION:  Education details: Updated and reviewed HEP, issued introductory walking program, instructed to pursue Tai Chi for arthritic pain relief, educated on objective progress made in PT to this point Person educated: Patient Education method: Verbal, demonstration, handout Education comprehension: Verbalized understanding, returned demonstration     HOME EXERCISE PROGRAM: Access Code: [email or web address] URL: [email or web address] Date: 03/11/2021 Prepared by: Raeford Razor   Exercises Seated Long Arc Quad - 2 x daily - 7 x weekly - 2 sets - 10 reps - 5 hold Supine Active Straight Leg Raise - 2 x daily - 7 x weekly - 2 sets - 10 reps - 5 hold Seated Hamstring Stretch - 2 x daily - 7 x weekly - 1 sets - 3 reps - 30 hold Sidelying Hip Abduction - 2 x daily - 7 x weekly - 2 sets - 10 reps - 5 hold  Added 03/25/2021: Standing ITB Stretch - 1 x daily - 7 x weekly - 3 sets - 30 hold Bridge with Hip Abduction and Resistance - 1 x daily - 7 x weekly - 3 sets - 10 reps - 3-sec hold Step overs   Added 04/22/2021: Single Leg Balance Clock WITH UPPER EXTREMITY SUPPORT - 1 x daily - 7 x weekly - 2 sets - 5 reps Standing Tandem Balance with Counter Support - 1 x daily - 7 x weekly - 3 sets - 30-sec hold    ASSESSMENT:   CLINICAL IMPRESSION:  Upon re-assessment, the pt has met her functional goals of PT. Due to this and the pt reporting independence in the management of her HEP, she is discharged from PT at this time. Her home exercise was updated to focus on independent balance exercises moving forward. Additionally, the pt was provided a walking program to fit her needs and also was educated on the benefits  of Tai Chi for the management of arthritic pain.    REHAB POTENTIAL: Excellent   CLINICAL DECISION MAKING: Stable/uncomplicated   EVALUATION COMPLEXITY: Low     GOALS: Goals reviewed with patient? No           LONG TERM GOALS:    LTG Name Target Date Goal status  1 Pt will be I with HEP for bilateral knee ROM and strength Baseline: Achieved 03/25/2021 04/22/2021 ACHIEVED 2/10  2 Pt will be able to get up from sofa, chair and toilet without assist > 75% of the time, pain < 3/10 Baseline:I can't really get up  04/22/2021 ACHIEVED   3 Pt will be able to score   55% or better on FOTO to demo improved functional mobility  Baseline: 04/22/2021 ACHIEVED 2/10  4 Pt will be able to negotiate > 6 steps with only 1 rail, mod I in clinic and at sister's home  Baseline: 04/22/2021: Pt ambulates 12 steps (8-inch) with step-over gait and one hand support 04/22/2021 ACHIEVED 2/17  5 Pt will be able to report less pain overall with household tasks, ADLs, 25% improved or more  Baseline: 04/22/2021 ACHIEVED 2/10                      PLAN: PT FREQUENCY: 2 x per week    PT DURATION: 6 weeks   PLANNED INTERVENTIONS: Therapeutic exercises, Therapeutic activity, Neuro Muscular re-education, Balance training, Gait training, Patient/Family education, Joint mobilization, Dry Needling, Cryotherapy, Moist heat, Taping, Ionotophoresis 76m/ml Dexamethasone, and Manual therapy   PLAN FOR NEXT SESSION: Pt is discharged from PT at this time.   YVanessa Bethune PT, DPT 04/22/21 10:42 AM  PHYSICAL  THERAPY DISCHARGE SUMMARY  Visits from Start of Care: 10  Current functional level related to goals / functional outcomes: Pt has met her functional goals of PT.   Remaining deficits: BIL knee pain, moderate difficulty descending stairs without UE support.   Education / Equipment: HEP   Patient agrees to discharge. Patient goals were met. Patient is being discharged due to meeting the stated rehab goals.

## 2021-04-21 NOTE — Patient Instructions (Signed)

## 2021-04-22 ENCOUNTER — Ambulatory Visit: Payer: Medicare PPO

## 2021-04-22 DIAGNOSIS — M25662 Stiffness of left knee, not elsewhere classified: Secondary | ICD-10-CM | POA: Diagnosis not present

## 2021-04-22 DIAGNOSIS — M6281 Muscle weakness (generalized): Secondary | ICD-10-CM

## 2021-04-22 DIAGNOSIS — M25561 Pain in right knee: Secondary | ICD-10-CM | POA: Diagnosis not present

## 2021-04-22 DIAGNOSIS — Z9181 History of falling: Secondary | ICD-10-CM

## 2021-04-22 DIAGNOSIS — R296 Repeated falls: Secondary | ICD-10-CM | POA: Diagnosis not present

## 2021-04-22 DIAGNOSIS — G8929 Other chronic pain: Secondary | ICD-10-CM

## 2021-04-22 DIAGNOSIS — M25661 Stiffness of right knee, not elsewhere classified: Secondary | ICD-10-CM | POA: Diagnosis not present

## 2021-04-22 DIAGNOSIS — M25562 Pain in left knee: Secondary | ICD-10-CM | POA: Diagnosis not present

## 2021-04-27 ENCOUNTER — Ambulatory Visit: Payer: Medicare PPO | Admitting: Physical Therapy

## 2021-05-03 ENCOUNTER — Ambulatory Visit (INDEPENDENT_AMBULATORY_CARE_PROVIDER_SITE_OTHER): Payer: Medicare PPO | Admitting: Psychology

## 2021-05-03 DIAGNOSIS — F840 Autistic disorder: Secondary | ICD-10-CM | POA: Diagnosis not present

## 2021-05-03 NOTE — Progress Notes (Signed)
° ° ° ° ° ° ° ° ° ° ° ° ° ° ° ° ° ° ° ° ° ° ° ° ° ° ° ° °  Date: 05/03/2021  Diagnosis G47.00 (Insomnia disorder) [n/a]  300.02 (Generalized anxiety disorder) [n/a]  Symptoms Complains of difficulty remaining asleep. (Status: maintained) -- No Description Entered  Excessive and/or unrealistic worry that is difficult to control occurring more days than not for at least 6 months about a number of events or activities. (Status: maintained) -- No Description Entered  Medication Status compliance  Safety none  If Suicidal or Homicidal State Action Taken: unspecified  Current Risk: low Medications unspecified Objectives Related Problem: Resolve the core conflict that is the source of anxiety. Description: Describe situations, thoughts, feelings, and actions associated with anxieties and worries, their impact on functioning, and attempts to resolve them. Target Date: 2022-02-12 Frequency: Daily Modality: individual Progress: 60%  Related Problem: Resolve the core conflict that is the source of anxiety. Description: Learn and implement calming skills to reduce overall anxiety and manage anxiety symptoms. Target Date: 2022-02-12 Frequency: Daily Modality: individual Progress: 60%  Related Problem: Resolve the core conflict that is the source of anxiety. Description: Learn and implement problem-solving strategies for realistically addressing worries. Target Date: 2022-02-12 Frequency: Daily Modality: individual Progress: 60%  Related Problem: Resolve the core conflict that is the source of anxiety. Description: Maintain involvement in work, family, and social activities. Target Date: 2022-02-12 Frequency: Daily Modality: individual Progress: 50%  Related Problem: Restore restful sleep pattern. Description: Describe the history and details of sleep pattern. Target Date: 2021-03-15 Frequency: Daily Modality: individual Progress: 70%    Client Response full compliance  Service  Location Location, [address] [place], [place] [address]  Service Code cpt [phone] P Self-monitoring  Lifestyle change (exercise, nutrition)  Self care activities  Distress tolerance skill  Identify/label emotions  Identify automatic thoughts  Facilitate problem solving  Normalize/Reframe  Behavioral activation plan  Validate/empathize  Comments  Dx.: Generalized Anxiety (F41.1)  Meds: No psychotropics Goals: [name] is seeking to develop a more satisfying life-style that takes into account her current physical condition. Goal date 11-04-20. States that the current situation in the world (politics and Covid 19) is making her very anxious. She is "obsessed" with the news (social media) and remains in a constant state of stress. She wants to learn to be less reactive. Will initiate behaviorally oriented individual therapy to address symptoms. Goal date is 10-04-21. While patient has realized some improvement in level of anxiety, she still expresses considerable stress and anxiety. Continues to need better balance in life. Revised date is 12-23.  Patient agrees to a video Webex video session due to Covid 19. She is at home and I am at my home office.   [name] went to cardiologist and has a murmur, but was told it is not a problem. She has no symptoms and was told not to worry. She says she is watching too much television and on the Internet too much. We discussed options that are more "healthy". She and sister are getting along better with each other. She says that she could write poetry. She wants activity that fosters her creativity.Marland Kitchen          [name], PhD  Time: 3:10-4:00 50 minutes

## 2021-05-05 ENCOUNTER — Telehealth: Payer: Self-pay | Admitting: Physical Therapy

## 2021-05-05 ENCOUNTER — Encounter: Payer: Self-pay | Admitting: Physical Therapy

## 2021-05-05 NOTE — Telephone Encounter (Signed)
Called patient to discuss her new referral for the same issue she was just discharged from.  She expressed concern about her ability to step off a curb.  She reports her knee is a lot better, less painful.  She worries about being able to walk up bigger steps without a handrail.   My recommendations are as follows: ? ?1)Use a cane when walking in the community to offload knee and for support with steps, curbs. This will also build confidence.  ? ?2)Continue to work on her HEP including balance exercises.  ? ?3) Consider rescheduling her appt if needed after 2-3 mos of gentle consistency with the above concepts.  ? ?She was in agreement. 05/11/21 cancelled.  ? ?Raeford Razor, PT ?05/05/21 9:28 AM ?Phone: [phone] ?Fax: [phone]  ?

## 2021-05-11 ENCOUNTER — Ambulatory Visit: Payer: Medicare PPO | Admitting: Physical Therapy

## 2021-05-17 ENCOUNTER — Ambulatory Visit (INDEPENDENT_AMBULATORY_CARE_PROVIDER_SITE_OTHER): Payer: Medicare PPO | Admitting: Psychology

## 2021-05-17 DIAGNOSIS — F411 Generalized anxiety disorder: Secondary | ICD-10-CM

## 2021-05-17 NOTE — Progress Notes (Deleted)
? ? ? ? ? ? ? ? ? ? ? ? ? ? ? ? ? ? ? ? ? ? ? ? ? ? ? ? ? ? ? ? ? ? ? ? ? ? ? ? ? ? ? ?Date: 05/17/2021  ?Diagnosis ?G47.00 (Insomnia disorder) [n/a]  ?300.02 (Generalized anxiety disorder) [n/a]  ?Symptoms ?Complains of difficulty remaining asleep. (Status: maintained) -- No Description Entered  ?Excessive and/or unrealistic worry that is difficult to control occurring more days than not for at least 6 months about a number of events or activities. (Status: maintained) -- No Description Entered  ?Medication Status ?compliance  ?Safety ?none  ?If Suicidal or Homicidal State Action Taken: unspecified  ?Current Risk: low ?Medications ?unspecified ?Objectives ?Related Problem: Resolve the core conflict that is the source of anxiety. ?Description: Describe situations, thoughts, feelings, and actions associated with anxieties and worries, their impact on functioning, and attempts to resolve them. ?Target Date: 2022-02-12 ?Frequency: Daily ?Modality: individual ?Progress: 60%  ?Related Problem: Resolve the core conflict that is the source of anxiety. ?Description: Learn and implement calming skills to reduce overall anxiety and manage anxiety symptoms. ?Target Date: 2022-02-12 ?Frequency: Daily ?Modality: individual ?Progress: 60% ? ?Related Problem: Resolve the core conflict that is the source of anxiety. ?Description: Learn and implement problem-solving strategies for realistically addressing worries. ?Target Date: 2022-02-12 ?Frequency: Daily ?Modality: individual ?Progress: 60% ? ?Related Problem: Resolve the core conflict that is the source of anxiety. ?Description: Maintain involvement in work, family, and social activities. ?Target Date: 2022-02-12 ?Frequency: Daily ?Modality: individual ?Progress: 50%  ?Related Problem: Restore restful sleep pattern. ?Description: Describe the history and details of sleep pattern. ?Target Date: 2022-02-12 ?Frequency: Daily ?Modality: individual ?Progress: 70% ? ? ? ?Client  Response ?full compliance  ?Service Location ?Location, [address] [place], [place] [address]  ?Service Code ?cpt [id number] P ?Self-monitoring  ?Lifestyle change (exercise, nutrition)  ?Self care activities  ?Distress tolerance skill  ?Identify/label emotions  ?Identify automatic thoughts  ?Facilitate problem solving  ?Normalize/Reframe  ?Behavioral activation plan  ?Validate/empathize  ?Comments  ?Dx.: Generalized Anxiety (F41.1)  ?Meds: No psychotropics Goals: [name] is seeking to develop a more satisfying life-style that takes into account her current physical condition. Goal date 11-04-20. States that the current situation in the world (politics and Covid 19) is making her very anxious. She is "obsessed" with the news (social media) and remains in a constant state of stress. She wants to learn to be less reactive. Will initiate behaviorally oriented individual therapy to address symptoms. Goal date is 10-04-21. While patient has realized some improvement in level of anxiety, she still expresses considerable stress and anxiety. Continues to need better balance in life. Revised date is 12-23.  ?Patient agrees to a video Webex video session due to Covid 19. She is at home and I am at my home office.  ? ?[name] states that she is sleeping better now that daylight savings time has happened. She is working hard in her physical therapy, hoping to resolve some of her knee pain and balance. She discussed that she is still distraught about all of the "bad news" out there in the world. She is feeling overwhelmed and anxious. She is especially stressed about having a new roof installed We talked about best ways to manage her anxieties. Agrees to make her plan and call the roofers this week.   ? ? ? ?Date: 05/17/2021  ?Diagnosis ?G47.00 (Insomnia disorder) [n/a]  ?300.02 (Generalized anxiety disorder) [n/a]  ?Symptoms ?Complains of  difficulty remaining asleep. (Status: maintained) -- No Description Entered  ?Excessive and/or  unrealistic worry that is difficult to control occurring more days than not for at least 6 months about a number of events or activities. (Status: maintained) -- No Description Entered  ?Medication Status ?compliance  ?Safety ?none  ?If Suicidal or Homicidal State Action Taken: unspecified  ?Current Risk: low ?Medications ?unspecified ?Objectives ?Related Problem: Resolve the core conflict that is the source of anxiety. ?Description: Describe situations, thoughts, feelings, and actions associated with anxieties and worries, their impact on functioning, and attempts to resolve them. ?Target Date: 2022-02-12 ?Frequency: Daily ?Modality: individual ?Progress: 60%  ?Related Problem: Resolve the core conflict that is the source of anxiety. ?Description: Learn and implement calming skills to reduce overall anxiety and manage anxiety symptoms. ?Target Date: 2022-02-12 ?Frequency: Daily ?Modality: individual ?Progress: 60% ? ?Related Problem: Resolve the core conflict that is the source of anxiety. ?Description: Learn and implement problem-solving strategies for realistically addressing worries. ?Target Date: 2022-02-12 ?Frequency: Daily ?Modality: individual ?Progress: 60% ? ?Related Problem: Resolve the core conflict that is the source of anxiety. ?Description: Maintain involvement in work, family, and social activities. ?Target Date: 2022-02-12 ?Frequency: Daily ?Modality: individual ?Progress: 50%  ?Related Problem: Restore restful sleep pattern. ?Description: Describe the history and details of sleep pattern. ?Target Date: 2021-03-15 ?Frequency: Daily ?Modality: individual ?Progress: 70% ? ? ? ?Client Response ?full compliance  ?Service Location ?Location, [address] [place], [place] [address]  ?Service Code ?cpt [id number] P ?Self-monitoring  ?Lifestyle change (exercise, nutrition)  ?Self care activities  ?Distress tolerance skill  ?Identify/label emotions  ?Identify automatic thoughts  ?Facilitate problem solving   ?Normalize/Reframe  ?Behavioral activation plan  ?Validate/empathize  ?Comments  ?Dx.: Generalized Anxiety (F41.1)  ?Meds: No psychotropics Goals: [name] is seeking to develop a more satisfying life-style that takes into account her current physical condition. Goal date 11-04-20. States that the current situation in the world (politics and Covid 19) is making her very anxious. She is "obsessed" with the news (social media) and remains in a constant state of stress. She wants to learn to be less reactive. Will initiate behaviorally oriented individual therapy to address symptoms. Goal date is 10-04-21. While patient has realized some improvement in level of anxiety, she still expresses considerable stress and anxiety. Continues to need better balance in life. Revised date is 12-23.  ?Patient agrees to a video Webex video session due to Covid 19. She is at home and I am at my home office.  ? ?[name] had her bone scan today and the results were "scary" to her. Told she has degenerative disease, but it is not extreme. She says her mother had a broken back and [name] she she would have the same problem. She has rejected hormone replacement out of fear of cancer. She is in physical therapy and has achieved her initial goals.  ?She talked about growing older and her challenges. She continues to be upset with the current political state of affairs. She admits to watching too much television and spending too much time on twitter.    ?   ? ?Joy Morel, PhD  Time: 3:10-4:00 50 minutes ?   ? ? ? ? ? ? ? ? ? ? ? ? ? ? ?        ?   ? ?Joy Morel, PhD  Time: 3:10-4:00 50 minutes ?   ? ? ? ? ? ? ? ? ? ? ? ? ? ? ? ?

## 2021-05-31 ENCOUNTER — Ambulatory Visit (INDEPENDENT_AMBULATORY_CARE_PROVIDER_SITE_OTHER): Payer: Medicare PPO | Admitting: Psychology

## 2021-05-31 DIAGNOSIS — F411 Generalized anxiety disorder: Secondary | ICD-10-CM | POA: Diagnosis not present

## 2021-05-31 NOTE — Progress Notes (Signed)
? ? ? ? ? ? ? ? ? ? ? ? ? ? ? ? ? ? ? ? ? ? ? ? ? ? ? ? ? ? ? ? ? ? ? ? ? ? ? ? ? ? ? ?  Date: 05/31/2021  ?Diagnosis ?G47.00 (Insomnia disorder) [n/a]  ?300.02 (Generalized anxiety disorder) [n/a]  ?Symptoms ?Complains of difficulty remaining asleep. (Status: maintained) -- No Description Entered  ?Excessive and/or unrealistic worry that is difficult to control occurring more days than not for at least 6 months about a number of events or activities. (Status: maintained) -- No Description Entered  ?Medication Status ?compliance  ?Safety ?none  ?If Suicidal or Homicidal State Action Taken: unspecified  ?Current Risk: low ?Medications ?unspecified ?Objectives ?Related Problem: Resolve the core conflict that is the source of anxiety. ?Description: Describe situations, thoughts, feelings, and actions associated with anxieties and worries, their impact on functioning, and attempts to resolve them. ?Target Date: 2022-02-12 ?Frequency: Daily ?Modality: individual ?Progress: 60%  ?Related Problem: Resolve the core conflict that is the source of anxiety. ?Description: Learn and implement calming skills to reduce overall anxiety and manage anxiety symptoms. ?Target Date: 2022-02-12 ?Frequency: Daily ?Modality: individual ?Progress: 60% ? ?Related Problem: Resolve the core conflict that is the source of anxiety. ?Description: Learn and implement problem-solving strategies for realistically addressing worries. ?Target Date: 2022-02-12 ?Frequency: Daily ?Modality: individual ?Progress: 60% ? ?Related Problem: Resolve the core conflict that is the source of anxiety. ?Description: Maintain involvement in work, family, and social activities. ?Target Date: 2022-02-12 ?Frequency: Daily ?Modality: individual ?Progress: 50%  ?Related Problem: Restore restful sleep pattern. ?Description: Describe the history and details of sleep pattern. ?Target Date: 2021-03-15 ?Frequency: Daily ?Modality: individual ?Progress: 70% ? ? ? ?Client  Response ?full compliance  ?Service Location ?Location, [address] [place],  [address]  ?Service Code ?cpt W4176370 P ?Self-monitoring  ?Lifestyle change (exercise, nutrition)  ?Self care activities  ?Distress tolerance skill  ?Identify/label emotions  ?Identify automatic thoughts  ?Facilitate problem solving  ?Normalize/Reframe  ?Behavioral activation plan  ?Validate/empathize  ?Comments  ?Dx.: Generalized Anxiety (F41.1)  ?Meds: No psychotropics Goals: Zelina is seeking to develop a more satisfying life-style that takes into account her current physical condition. Goal date 11-04-20. States that the current situation in the world (politics and Covid 19) is making her very anxious. She is "obsessed" with the news (social media) and remains in a constant state of stress. She wants to learn to be less reactive. Will initiate behaviorally oriented individual therapy to address symptoms. Goal date is 10-04-21. While patient has realized some improvement in level of anxiety, she still expresses considerable stress and anxiety. Continues to need better balance in life. Revised date is 12-23.  ?Patient agrees to a video Webex video session due to Covid 19. She is at home and I am at my home office.  ? ?Sophie says she is going back to work for a few weeks. She will work limited [employer] and only til start of June. The down side is that the work isn't fun and she has to pass a test. She is also concerned about being tired. Only "up side" is it will help her be more scheduled and she will have extra money. Will discuss further next time,   ?        ?   ? ?[name], PhD  Time: 3:10-4:00 50 minutes ?   ? ? ? ? ? ? ? ? ? ? ? ? ? ? ? ?

## 2021-06-06 ENCOUNTER — Ambulatory Visit (INDEPENDENT_AMBULATORY_CARE_PROVIDER_SITE_OTHER): Payer: Medicare PPO | Admitting: Psychology

## 2021-06-06 DIAGNOSIS — F411 Generalized anxiety disorder: Secondary | ICD-10-CM | POA: Diagnosis not present

## 2021-06-06 NOTE — Progress Notes (Signed)
? ? ? ? ? ? ? ? ? ? ? ? ? ? ? ? ? ? ? ? ? ? ? ? ? ? ? ? ? ? ? ? ? ? ? ? ? ? ? ? ? ? ? ? ? ? ? ? ? ? ? ? ? ? ? ? ? ? ?Date: 06/06/2021  ?Diagnosis ?G47.00 (Insomnia disorder) [n/a]  ?300.02 (Generalized anxiety disorder) [n/a]  ?Symptoms ?Complains of difficulty remaining asleep. (Status: maintained) -- No Description Entered  ?Excessive and/or unrealistic worry that is difficult to control occurring more days than not for at least 6 months about a number of events or activities. (Status: maintained) -- No Description Entered  ?Medication Status ?compliance  ?Safety ?none  ?If Suicidal or Homicidal State Action Taken: unspecified  ?Current Risk: low ?Medications ?unspecified ?Objectives ?Related Problem: Resolve the core conflict that is the source of anxiety. ?Description: Describe situations, thoughts, feelings, and actions associated with anxieties and worries, their impact on functioning, and attempts to resolve them. ?Target Date: 2022-02-12 ?Frequency: Daily ?Modality: individual ?Progress: 60%  ?Related Problem: Resolve the core conflict that is the source of anxiety. ?Description: Learn and implement calming skills to reduce overall anxiety and manage anxiety symptoms. ?Target Date: 2022-02-12 ?Frequency: Daily ?Modality: individual ?Progress: 60% ? ?Related Problem: Resolve the core conflict that is the source of anxiety. ?Description: Learn and implement problem-solving strategies for realistically addressing worries. ?Target Date: 2022-02-12 ?Frequency: Daily ?Modality: individual ?Progress: 60% ? ?Related Problem: Resolve the core conflict that is the source of anxiety. ?Description: Maintain involvement in work, family, and social activities. ?Target Date: 2022-02-12 ?Frequency: Daily ?Modality: individual ?Progress: 50%  ?Related Problem: Restore restful sleep pattern. ?Description: Describe the history and details of sleep pattern. ?Target Date: 2021-03-15 ?Frequency: Daily ?Modality:  individual ?Progress: 70% ? ? ? ?Client Response ?full compliance  ?Service Location ?Location, [address] [place], [place] [address]  ?Service Code ?cpt [id number] P ?Self-monitoring  ?Lifestyle change (exercise, nutrition)  ?Self care activities  ?Distress tolerance skill  ?Identify/label emotions  ?Identify automatic thoughts  ?Facilitate problem solving  ?Normalize/Reframe  ?Behavioral activation plan  ?Validate/empathize  ?Comments  ?Dx.: Generalized Anxiety (F41.1)  ?Meds: No psychotropics Goals: [name] is seeking to develop a more satisfying life-style that takes into account her current physical condition. Goal date 11-04-20. States that the current situation in the world (politics and Covid 19) is making her very anxious. She is "obsessed" with the news (social media) and remains in a constant state of stress. She wants to learn to be less reactive. Will initiate behaviorally oriented individual therapy to address symptoms. Goal date is 10-04-21. While patient has realized some improvement in level of anxiety, she still expresses considerable stress and anxiety. Continues to need better balance in life. Revised date is 12-23.  ?Patient agrees to a video Webex video session due to Covid 19. She is at home and I am at my home office.  ? ?[name] says she is tired today. She has ben working to resolve some of her home issues.She is scheduled to go back to work on April 5. She has concerns that she will not be interested in the work and that it will make her too tired. She has been having significan knee pain, but the physical therapy exercises help. Her balance, however, continues to be a problem and isn't much better. She still feel reluctant to spend much time in public or go anywhere there is a large crowd due to Covid. We  are discussing what criteria needs to change for her to feel safe.  ?   ?        ?   ? ?[name], PhD  Time: 3:10-4:00 50 minutes ?   ? ? ? ? ? ? ? ? ? ? ? ? ? ? ? ?

## 2021-06-14 ENCOUNTER — Ambulatory Visit (INDEPENDENT_AMBULATORY_CARE_PROVIDER_SITE_OTHER): Payer: Medicare PPO | Admitting: Psychology

## 2021-06-14 DIAGNOSIS — F411 Generalized anxiety disorder: Secondary | ICD-10-CM

## 2021-06-14 NOTE — Progress Notes (Signed)
? ? ? ? ? ? ? ? ? ? ? ? ? ? ? ? ? ?  Date: 06/14/2021  ?Diagnosis ?G47.00 (Insomnia disorder) [n/a]  ?300.02 (Generalized anxiety disorder) [n/a]  ?Symptoms ?Complains of difficulty remaining asleep. (Status: maintained) -- No Description Entered  ?Excessive and/or unrealistic worry that is difficult to control occurring more days than not for at least 6 months about a number of events or activities. (Status: maintained) -- No Description Entered  ?Medication Status ?compliance  ?Safety ?none  ?If Suicidal or Homicidal State Action Taken: unspecified  ?Current Risk: low ?Medications ?unspecified ?Objectives ?Related Problem: Resolve the core conflict that is the source of anxiety. ?Description: Describe situations, thoughts, feelings, and actions associated with anxieties and worries, their impact on functioning, and attempts to resolve them. ?Target Date: 2022-02-12 ?Frequency: Daily ?Modality: individual ?Progress: 60%  ?Related Problem: Resolve the core conflict that is the source of anxiety. ?Description: Learn and implement calming skills to reduce overall anxiety and manage anxiety symptoms. ?Target Date: 2022-02-12 ?Frequency: Daily ?Modality: individual ?Progress: 60% ? ?Related Problem: Resolve the core conflict that is the source of anxiety. ?Description: Learn and implement problem-solving strategies for realistically addressing worries. ?Target Date: 2022-02-12 ?Frequency: Daily ?Modality: individual ?Progress: 60% ? ?Related Problem: Resolve the core conflict that is the source of anxiety. ?Description: Maintain involvement in work, family, and social activities. ?Target Date: 2022-02-12 ?Frequency: Daily ?Modality: individual ?Progress: 50%  ?Related Problem: Restore restful sleep pattern. ?Description: Describe the history and details of sleep pattern. ?Target Date: 2021-03-15 ?Frequency: Daily ?Modality: individual ?Progress: 70% ? ? ? ?Client Response ?full compliance  ?Service Location ?Location, [address] [place], [place] [address]  ?Service Code ?cpt W4176370 P ?Self-monitoring  ?Lifestyle change (exercise, nutrition)  ?Self care activities  ?Distress tolerance skill  ?Identify/label emotions  ?Identify automatic thoughts  ?Facilitate problem solving  ?Normalize/Reframe  ?Behavioral activation plan  ?Validate/empathize  ?Comments  ?Dx.: Generalized Anxiety (F41.1)  ?Meds: No psychotropics Goals: Ankita is seeking to develop a more satisfying life-style that takes into account her current physical condition. Goal date 11-04-20. States that the current situation in the world (politics and Covid 19) is making her very anxious. She is "obsessed" with the news (social media) and remains in a constant state of stress. She wants to learn to be less reactive. Will initiate behaviorally oriented individual therapy to address symptoms. Goal date is 10-04-21. While patient has realized some improvement in level of anxiety, she still expresses considerable stress and anxiety. Continues to need better balance in life. Revised date is 12-23.  ?Patient agrees to a video Webex video session due to Covid 19. She is at home and I am at my home office.  ? ?Chantella says that one of her friends died a few days ago. She did not know that her friend was sick.She got trained for the job, but they have not given her any work yet.Not especially anxious to get to work.  ?She reflected on her time with her husband living in [place] for 7 years. No regrets even though it was very difficult.  ?She talked about her own mortality and desire to stay healthy.     ?   ?        ?   ? ?[name], PhD  Time: 3:10-4:00 50 minutes ?   ? ? ? ? ? ? ? ? ? ? ? ? ? ? ? ?

## 2021-06-21 ENCOUNTER — Ambulatory Visit (INDEPENDENT_AMBULATORY_CARE_PROVIDER_SITE_OTHER): Payer: Medicare PPO | Admitting: Psychology

## 2021-06-21 DIAGNOSIS — F411 Generalized anxiety disorder: Secondary | ICD-10-CM | POA: Diagnosis not present

## 2021-06-21 NOTE — Progress Notes (Signed)
? ? ? ? ? ? ? ? ? ? ? ? ? ? ? ? ? ? ? ? ? ? ? ? ? ? ? ? ? ? ? ? ?Date: 06/21/2021  ?Diagnosis ?G47.00 (Insomnia disorder) [n/a]  ?300.02 (Generalized anxiety disorder) [n/a]  ?Symptoms ?Complains of difficulty remaining asleep. (Status: maintained) -- No Description Entered  ?Excessive and/or unrealistic worry that is difficult to control occurring more days than not for at least 6 months about a number of events or activities. (Status: maintained) -- No Description Entered  ?Medication Status ?compliance  ?Safety ?none  ?If Suicidal or Homicidal State Action Taken: unspecified  ?Current Risk: low ?Medications ?unspecified ?Objectives ?Related Problem: Resolve the core conflict that is the source of anxiety. ?Description: Describe situations, thoughts, feelings, and actions associated with anxieties and worries, their impact on functioning, and attempts to resolve them. ?Target Date: 2022-02-12 ?Frequency: Daily ?Modality: individual ?Progress: 60%  ?Related Problem: Resolve the core conflict that is the source of anxiety. ?Description: Learn and implement calming skills to reduce overall anxiety and manage anxiety symptoms. ?Target Date: 2022-02-12 ?Frequency: Daily ?Modality: individual ?Progress: 60% ? ?Related Problem: Resolve the core conflict that is the source of anxiety. ?Description: Learn and implement problem-solving strategies for realistically addressing worries. ?Target Date: 2022-02-12 ?Frequency: Daily ?Modality: individual ?Progress: 60% ? ?Related Problem: Resolve the core conflict that is the source of anxiety. ?Description: Maintain involvement in work, family, and social activities. ?Target Date: 2022-02-12 ?Frequency: Daily ?Modality: individual ?Progress: 50%  ?Related Problem: Restore restful sleep pattern. ?Description: Describe the history and details of sleep pattern. ?Target Date: 2021-03-15 ?Frequency: Daily ?Modality: individual ?Progress: 70% ? ? ? ?Client Response ?full compliance   ?Service Location ?Location, [address] [place], [place] [address]  ?Service Code ?cpt [id number] P ?Self-monitoring  ?Lifestyle change (exercise, nutrition)  ?Self care activities  ?Distress tolerance skill  ?Identify/label emotions  ?Identify automatic thoughts  ?Facilitate problem solving  ?Normalize/Reframe  ?Behavioral activation plan  ?Validate/empathize  ?Comments  ?Dx.: Generalized Anxiety (F41.1)  ?Meds: No psychotropics Goals: [name] is seeking to develop a more satisfying life-style that takes into account her current physical condition. Goal date 11-04-20. States that the current situation in the world (politics and Covid 19) is making her very anxious. She is "obsessed" with the news (social media) and remains in a constant state of stress. She wants to learn to be less reactive. Will initiate behaviorally oriented individual therapy to address symptoms. Goal date is 10-04-21. While patient has realized some improvement in level of anxiety, she still expresses considerable stress and anxiety. Continues to need better balance in life. Revised date is 12-23.  ?Patient agrees to a video Webex video session due to Covid 19. She is at home and I am at my home office.  ? ?[name] says she is feeling down because she took the training and there has not been any work at all. She does not like the uncertainty. Once she decided to do this she started to count on it. She says she has become obsessed with checking the website. She admits that once she gets involved with something, she tends to get obsessed. This is the problem she has had and currently continues to have with the news (and political figures). She says she cannot shut off the input of news and she gets overly distressed and angry. She spends "too much" of her time on the internet reading about politics and political issues. The only thing she does that helps  her feel better is to make (small) donations to important causes.        ?   ?        ?    ? ?Joy Morel, PhD  Time: 4:10-5:00 50 minutes ?   ? ? ? ? ? ? ? ? ? ? ? ? ? ? ? ?

## 2021-06-22 NOTE — Progress Notes (Incomplete)
? ? ? ? ? ? ? ? ? ? ? ? ? ? ?  Jadden Yim LEWIS, PhD 

## 2021-06-28 ENCOUNTER — Ambulatory Visit (INDEPENDENT_AMBULATORY_CARE_PROVIDER_SITE_OTHER): Payer: Medicare PPO | Admitting: Psychology

## 2021-06-28 DIAGNOSIS — F411 Generalized anxiety disorder: Secondary | ICD-10-CM

## 2021-06-28 NOTE — Progress Notes (Signed)
? ? ? ? ? ? ? ? ? ? ? ? ? ? ? ? ? ? ? ? ? ? ? ? ?  Date: 06/28/2021  ?Diagnosis ?G47.00 (Insomnia disorder) [n/a]  ?300.02 (Generalized anxiety disorder) [n/a]  ?Symptoms ?Complains of difficulty remaining asleep. (Status: maintained) -- No Description Entered  ?Excessive and/or unrealistic worry that is difficult to control occurring more days than not for at least 6 months about a number of events or activities. (Status: maintained) -- No Description Entered  ?Medication Status ?compliance  ?Safety ?none  ?If Suicidal or Homicidal State Action Taken: unspecified  ?Current Risk: low ?Medications ?unspecified ?Objectives ?Related Problem: Resolve the core conflict that is the source of anxiety. ?Description: Describe situations, thoughts, feelings, and actions associated with anxieties and worries, their impact on functioning, and attempts to resolve them. ?Target Date: 2022-02-12 ?Frequency: Daily ?Modality: individual ?Progress: 60%  ?Related Problem: Resolve the core conflict that is the source of anxiety. ?Description: Learn and implement calming skills to reduce overall anxiety and manage anxiety symptoms. ?Target Date: 2022-02-12 ?Frequency: Daily ?Modality: individual ?Progress: 60% ? ?Related Problem: Resolve the core conflict that is the source of anxiety. ?Description: Learn and implement problem-solving strategies for realistically addressing worries. ?Target Date: 2022-02-12 ?Frequency: Daily ?Modality: individual ?Progress: 60% ? ?Related Problem: Resolve the core conflict that is the source of anxiety. ?Description: Maintain involvement in work, family, and social activities. ?Target Date: 2022-02-12 ?Frequency: Daily ?Modality: individual ?Progress: 50%  ?Related Problem: Restore restful sleep pattern. ?Description: Describe the history and details of sleep pattern. ?Target Date: 2021-03-15 ?Frequency: Daily ?Modality: individual ?Progress: 70% ? ? ? ?Client Response ?full compliance  ?Service  Location ?Location, [address] [place], [place] [address]  ?Service Code ?cpt W4176370 P ?Self-monitoring  ?Lifestyle change (exercise, nutrition)  ?Self care activities  ?Distress tolerance skill  ?Identify/label emotions  ?Identify automatic thoughts  ?Facilitate problem solving  ?Normalize/Reframe  ?Behavioral activation plan  ?Validate/empathize  ?Comments  ?Dx.: Generalized Anxiety (F41.1)  ?Meds: No psychotropics Goals: Amiree is seeking to develop a more satisfying life-style that takes into account her current physical condition. Goal date 11-04-20. States that the current situation in the world (politics and Covid 19) is making her very anxious. She is "obsessed" with the news (social media) and remains in a constant state of stress. She wants to learn to be less reactive. Will initiate behaviorally oriented individual therapy to address symptoms. Goal date is 10-04-21. While patient has realized some improvement in level of anxiety, she still expresses considerable stress and anxiety. Continues to need better balance in life. Revised date is 12-23.  ?Patient agrees to a video Webex video session due to Covid 19. She is at home and I am at my home office.  ? ?Keiarra says she got fired from her job because they said she was not accurate enough. She is fine with not working at this job and told them to "take her off the list".  ?She is scheduled for the next vaccine on Saturday. She is "trying" to do exercise every day, but is not 100% compliant. She is however, reporting that she can tell she is much stronger.           ?   ?        ?   ? ?[name], PhD  Time: 3:10-4:00 50 minutes ?   ? ? ? ? ? ? ? ? ? ? ? ? ? ? ? ?

## 2021-07-05 ENCOUNTER — Ambulatory Visit (INDEPENDENT_AMBULATORY_CARE_PROVIDER_SITE_OTHER): Payer: Medicare PPO | Admitting: Psychology

## 2021-07-05 DIAGNOSIS — F411 Generalized anxiety disorder: Secondary | ICD-10-CM

## 2021-07-05 NOTE — Progress Notes (Signed)
? ? ? ? ? ? ? ? ? ? ? ? ? ? ? ? ? ? ? ? ? ? ? ? ? ? ? ? ? ? ? ? ? ? ? ? ? ? ? ?Date: 07/05/2021  ?Diagnosis ?G47.00 (Insomnia disorder) [n/a]  ?300.02 (Generalized anxiety disorder) [n/a]  ?Symptoms ?Complains of difficulty remaining asleep. (Status: maintained) -- No Description Entered  ?Excessive and/or unrealistic worry that is difficult to control occurring more days than not for at least 6 months about a number of events or activities. (Status: maintained) -- No Description Entered  ?Medication Status ?compliance  ?Safety ?none  ?If Suicidal or Homicidal State Action Taken: unspecified  ?Current Risk: low ?Medications ?unspecified ?Objectives ?Related Problem: Resolve the core conflict that is the source of anxiety. ?Description: Describe situations, thoughts, feelings, and actions associated with anxieties and worries, their impact on functioning, and attempts to resolve them. ?Target Date: 2022-02-12 ?Frequency: Daily ?Modality: individual ?Progress: 60%  ?Related Problem: Resolve the core conflict that is the source of anxiety. ?Description: Learn and implement calming skills to reduce overall anxiety and manage anxiety symptoms. ?Target Date: 2022-02-12 ?Frequency: Daily ?Modality: individual ?Progress: 60% ? ?Related Problem: Resolve the core conflict that is the source of anxiety. ?Description: Learn and implement problem-solving strategies for realistically addressing worries. ?Target Date: 2022-02-12 ?Frequency: Daily ?Modality: individual ?Progress: 60% ? ?Related Problem: Resolve the core conflict that is the source of anxiety. ?Description: Maintain involvement in work, family, and social activities. ?Target Date: 2022-02-12 ?Frequency: Daily ?Modality: individual ?Progress: 50%  ?Related Problem: Restore restful sleep pattern. ?Description: Describe the history and details of sleep pattern. ?Target Date: 2021-03-15 ?Frequency: Daily ?Modality: individual ?Progress: 70% ? ? ? ?Client Response ?full  compliance  ?Service Location ?Location, [address] [place], [place] [address]  ?Service Code ?cpt W4176370 P ?Self-monitoring  ?Lifestyle change (exercise, nutrition)  ?Self care activities  ?Distress tolerance skill  ?Identify/label emotions  ?Identify automatic thoughts  ?Facilitate problem solving  ?Normalize/Reframe  ?Behavioral activation plan  ?Validate/empathize  ?Comments  ?Dx.: Generalized Anxiety (F41.1)  ?Meds: No psychotropics Goals: [name] is seeking to develop a more satisfying life-style that takes into account her current physical condition. Goal date 11-04-20. States that the current situation in the world (politics and Covid 19) is making her very anxious. She is "obsessed" with the news (social media) and remains in a constant state of stress. She wants to learn to be less reactive. Will initiate behaviorally oriented individual therapy to address symptoms. Goal date is 10-04-21. While patient has realized some improvement in level of anxiety, she still expresses considerable stress and anxiety. Continues to need better balance in life. Revised date is 12-23.  ?Patient agrees to a video Webex video session due to Covid 19. She is at home and I am at my home office.  ? ?[name] is stressing about her roof replacement. She is getting confused and is concerned about the amount of money it will cost. Talked about how to get necessary information and try to stay calm. ?She talked about her stress related to the virus and struggles with the decision of whether to attend various events. She fears the virus and chooses to remain extremely cautious. She did have a second bivariant vaccine and is likely safe. She is willing to take a (very small) risk and attend a family event.              ?   ?        ?   ? ?  [name], PhD  Time: 3:10-4:00 50 minutes ?   ? ? ? ? ? ? ? ? ? ? ? ? ? ? ? ?

## 2021-07-12 ENCOUNTER — Ambulatory Visit (INDEPENDENT_AMBULATORY_CARE_PROVIDER_SITE_OTHER): Payer: Medicare PPO | Admitting: Psychology

## 2021-07-12 DIAGNOSIS — F411 Generalized anxiety disorder: Secondary | ICD-10-CM | POA: Diagnosis not present

## 2021-07-12 NOTE — Progress Notes (Signed)
? ? ? ? ? ? ? ? ? ? ? ? ? ? ? ? ? ? ? ? ? ? ? ? ? ? ? ? ? ? ? ? ? ? ? ? ? ? ? ? ? ? ? ? ? ? ? ? ? ? ? ? ? ? ?Date: 07/12/2021  ?Diagnosis ?G47.00 (Insomnia disorder) [n/a]  ?300.02 (Generalized anxiety disorder) [n/a]  ?Symptoms ?Complains of difficulty remaining asleep. (Status: maintained) -- No Description Entered  ?Excessive and/or unrealistic worry that is difficult to control occurring more days than not for at least 6 months about a number of events or activities. (Status: maintained) -- No Description Entered  ?Medication Status ?compliance  ?Safety ?none  ?If Suicidal or Homicidal State Action Taken: unspecified  ?Current Risk: low ?Medications ?unspecified ?Objectives ?Related Problem: Resolve the core conflict that is the source of anxiety. ?Description: Describe situations, thoughts, feelings, and actions associated with anxieties and worries, their impact on functioning, and attempts to resolve them. ?Target Date: 2022-02-12 ?Frequency: Daily ?Modality: individual ?Progress: 60%  ?Related Problem: Resolve the core conflict that is the source of anxiety. ?Description: Learn and implement calming skills to reduce overall anxiety and manage anxiety symptoms. ?Target Date: 2022-02-12 ?Frequency: Daily ?Modality: individual ?Progress: 60% ? ?Related Problem: Resolve the core conflict that is the source of anxiety. ?Description: Learn and implement problem-solving strategies for realistically addressing worries. ?Target Date: 2022-02-12 ?Frequency: Daily ?Modality: individual ?Progress: 60% ? ?Related Problem: Resolve the core conflict that is the source of anxiety. ?Description: Maintain involvement in work, family, and social activities. ?Target Date: 2022-02-12 ?Frequency: Daily ?Modality: individual ?Progress: 50%  ?Related Problem: Restore restful sleep pattern. ?Description: Describe the history and details of sleep pattern. ?Target Date: 2021-03-15 ?Frequency: Daily ?Modality: individual ?Progress:  70% ? ? ? ?Client Response ?full compliance  ?Service Location ?Location, [address] [place], [place] [address]  ?Service Code ?cpt [id number] P ?Self-monitoring  ?Lifestyle change (exercise, nutrition)  ?Self care activities  ?Distress tolerance skill  ?Identify/label emotions  ?Identify automatic thoughts  ?Facilitate problem solving  ?Normalize/Reframe  ?Behavioral activation plan  ?Validate/empathize  ?Comments  ?Dx.: Generalized Anxiety (F41.1)  ?Meds: No psychotropics Goals: Ahilyn is seeking to develop a more satisfying life-style that takes into account her current physical condition. Goal date 11-04-20. States that the current situation in the world (politics and Covid 19) is making her very anxious. She is "obsessed" with the news (social media) and remains in a constant state of stress. She wants to learn to be less reactive. Will initiate behaviorally oriented individual therapy to address symptoms. Goal date is 10-04-21. While patient has realized some improvement in level of anxiety, she still expresses considerable stress and anxiety. Continues to need better balance in life. Revised date is 12-23.  ?Patient agrees to a video Webex video session due to Covid 19. She is at home and I am at my home office.  ? ?Alijah got out of the house for the first time in a few days and is very pleased. Still feeling stress over her roof repair. She is going with her sister to the "[place]" at the [place]. ?She is trying to decide whether to attend a wedding. Has concerns about being in the crowd given the virus. She fears she has too many high risk factors to take a chance. She will make a decision this week. We discussed the psychological importance of getting out and interacting with other people. Needs to weigh the benefits/consequences.  ?               ?   ?        ?   ? ?  [name], PhD  Time: 3:10-4:00 50 minutes ?   ? ? ? ? ? ? ? ? ? ? ? ? ? ? ? ?

## 2021-07-15 ENCOUNTER — Ambulatory Visit: Payer: Medicare PPO | Admitting: Internal Medicine

## 2021-07-15 ENCOUNTER — Encounter: Payer: Self-pay | Admitting: Internal Medicine

## 2021-07-15 VITALS — BP 140/84 | HR 69 | Ht 60.25 in | Wt 185.6 lb

## 2021-07-15 DIAGNOSIS — E039 Hypothyroidism, unspecified: Secondary | ICD-10-CM | POA: Diagnosis not present

## 2021-07-15 LAB — T3, FREE: T3, Free: 2.7 pg/mL (ref 2.3–4.2)

## 2021-07-15 LAB — T4, FREE: Free T4: 1.16 ng/dL (ref 0.60–1.60)

## 2021-07-15 LAB — TSH: TSH: 1.12 u[IU]/mL (ref 0.35–5.50)

## 2021-07-15 MED ORDER — LIOTHYRONINE SODIUM 5 MCG PO TABS
ORAL_TABLET | ORAL | 3 refills | Status: DC
Start: 2021-07-15 — End: 2022-07-28

## 2021-07-15 MED ORDER — LEVOTHYROXINE SODIUM 112 MCG PO TABS: 112.0000 ug | ORAL_TABLET | Freq: Every day | ORAL | 3 refills | Status: DC

## 2021-07-15 NOTE — Progress Notes (Signed)
Patient ID: [name], female   DOB: 24-Nov-1945, 76 y.o.   MRN: [id number] ? ?This visit occurred during the SARS-CoV-2 public health emergency.  Safety protocols were in place, including screening questions prior to the visit, additional usage of staff PPE, and extensive cleaning of exam room while observing appropriate contact time as indicated for disinfecting solutions.  ? ?HPI  ?Mr. Soliman is a 76 y.o.-year-old female, presenting for f/u for acquired hypothyroidism. Last visit 1 year ago. ?She will see Dr. Nancy Fetter. ? ?Interim hx: ?She continues to have fatigue, imbalance, palpitations, insomnia/anxiety. ?She has balance problems. She started exercise - PT. This helps with her leg swelling. ? ?Reviewed hx: ?She has been diagnosed with hypothyroidism "many years" ago.  She continues on both liothyronine and levothyroxine. ? ?She takes LT4 112 mcg and LT3 5 mcg daily (equivalent of 132 mcg of LT4): ?- in am ?- fasting ?- at least 1 hour from b'fast ?- no Fe, MVI, PPIs ?- no Calcium  ?- not on Biotin ?On vit C and D at night. ? ?Reviewed latest TFTs: ?Lab Results  ?Component Value Date  ? TSH 1.210 07/16/2020  ? TSH 1.880 06/23/2019  ? TSH 0.963 03/28/2018  ? TSH 1.240 08/14/2017  ? TSH 1.230 09/01/2016  ? TSH 1.75 11/13/2015  ? TSH 1.34 04/14/2015  ? TSH 3.549 02/12/2015  ? TSH 5.529 (H) 12/02/2014  ? TSH 2.161 05/30/2014  ? FREET4 1.71 07/16/2020  ? FREET4 1.46 06/23/2019  ? FREET4 1.67 03/28/2018  ? FREET4 1.82 (H) 08/14/2017  ? FREET4 1.75 09/01/2016  ? FREET4 1.6 11/13/2015  ? FREET4 1.37 04/14/2015  ? FREET4 1.71 04/30/2013  ? FREET4 1.45 10/05/2012  ? FREET4 1.39 08/16/2012  ? ?She complains of poor memory when the TSH is close to the upper limit of normal.  She started to feel much better after the last increase in dose in 02/2015) remains normal after.   ?She continues to complain of insomnia, which is chronic for her. ? ?Pt denies: ?- feeling nodules in neck ?- hoarseness ?- dysphagia ?- choking ?-  SOB with lying down ? ?She has no FH of thyroid disorders. No FH of thyroid cancer. No h/o radiation tx to head or neck. ?No herbal supplements. No Biotin use. No recent steroids use.  ? ?She has anxiety >> seeing a therapist. ?She uses a CPAP machine >> cough in am. ?She has osteopenia. ?ROS: ?+ See HPI ? ?I reviewed pt's medications, allergies, PMH, social hx, family hx, and changes were documented in the history of present illness. Otherwise, unchanged from my initial visit note. ? ?Past Medical History:  ?Diagnosis Date  ? Abnormal perimenopausal bleeding 2005  ? Allergy   ? Anemia   ? Anxiety   ? Dysuria 2004  ? Endometrial polyp 2006  ? Environmental allergies   ? Fibroid 02/19/03  ? H/O varicella   ? History of measles, mumps, or rubella   ? Hx of colonic polyps 12/15/2010  ? Hypertension 07-19-2010  ? echo for pre-op EF 55%  normal left wall thickness LA mildly dialated with mitral and tricuspid regurgatation  ? Hypothyroidism   ? Interstitial cystitis   ? Menopausal symptoms 2004  ? Obesity   ? Osteopenia 02/2012  ? -1.8 T score right femur neck  ? PMB (postmenopausal bleeding) 06/21/10  ? Sleep apnea   ? Urinary incontinence 2011  ? Vitamin D deficiency   ? history of  ? ?Past Surgical History:  ?Procedure Laterality Date  ?  COLONOSCOPY  12/15/10  ? HYSTEROSCOPY  2006 and May 2012  ? for fibroids, endometrial polyps  ? MOUTH SURGERY    ? MYOMECTOMY  1974  ? ?Social History  ? ?Social History  ? Marital Status: Divorced  ?  Spouse Name: N/A  ? Number of Children: 0  ? ?Occupational History  ? 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 ?Social History Main Topics  ? Smoking status: Never Smoker   ? Smokeless tobacco: Never Used  ? Alcohol Use: 0.6 oz/week  ?  1 Glasses of wine per week  ?   Comment: occasional (1-2 times per week)  ? Drug Use: No  ? ?Social History Narrative  ? Divorced. Education: [employer]. Exercise: Yoga 2 times a week.  ? ?Current Outpatient Medications on File Prior to Visit   ?Medication Sig Dispense Refill  ? Ascorbic Acid (VITAMIN C) 1000 MG tablet Take 1,000 mg by mouth daily. One tablet a day    ? benazepril-hydrochlorthiazide (LOTENSIN HCT) 20-12.5 MG tablet Take 2 tablets by mouth daily. 180 tablet 1  ? cholecalciferol (VITAMIN D3) 25 MCG (1000 UNIT) tablet Take 2,000 Units by mouth daily.    ? hydrALAZINE (APRESOLINE) 25 MG tablet TAKE 2 TABLETS BY MOUTH EVERY MORNING, THEN 1 TABLET BY MOUTH AT LUNCH AND 2 TABLETS AT DINNER 450 tablet 3  ? levothyroxine (SYNTHROID) 112 MCG tablet TAKE 1 TABLET BY MOUTH EVERY DAY BEFORE BREAKFAST 90 tablet 1  ? liothyronine (CYTOMEL) 5 MCG tablet Take 1 tablet by mouth before breakfast 90 tablet 3  ? metoprolol succinate (TOPROL-XL) 25 MG 24 hr tablet TAKE 1 TABLET(25 MG) BY MOUTH DAILY 90 tablet 3  ? ?No current facility-administered medications on file prior to visit.  ? ?Allergies  ?Allergen Reactions  ? Bactrim [Sulfamethoxazole-Trimethoprim]   ? Epinephrine   ?  shakey  ? Erythromycin   ? Sulfa Antibiotics Rash  ? ?Family History  ?Problem Relation Age of Onset  ? Kidney disease Father   ?     from uremia  ? Benign prostatic hyperplasia Father   ? Hypertension Mother   ? Colon cancer Paternal Uncle   ? Clotting disorder Maternal Grandmother   ?     ????  ? ?PE: ?BP 140/84 (BP Location: Right Arm, Patient Position: Sitting, Cuff Size: Normal)   Pulse 69   Ht 5' 0.25" (1.53 m)   Wt 185 lb 9.6 oz (84.2 kg)   SpO2 99%   BMI 35.95 kg/m?  ?Wt Readings from Last 3 Encounters:  ?07/15/21 185 lb 9.6 oz (84.2 kg)  ?04/21/21 186 lb 3.2 oz (84.5 kg)  ?07/16/20 188 lb 9.6 oz (85.5 kg)  ? ?Constitutional: overweight, in NAD ?Eyes: PERRLA, EOMI, no exophthalmos ?ENT: moist mucous membranes, no thyromegaly, no cervical lymphadenopathy ?Cardiovascular: RRR, No RG, + SEM 1/6 ?Respiratory: CTA B ?Musculoskeletal: no deformities, strength intact in all 4 ?Skin: moist, warm, no rashes ?Neurological: no tremor with outstretched hands, DTR normal in all  4 ? ?ASSESSMENT: ?1. Hypothyroidism ? ?PLAN:  ?1. Patient with longstanding hypothyroidism, on levothyroxine and liothyronine therapy.  She complains of memory loss with a TSH in the range of the normal interval. ?- latest thyroid labs reviewed with pt. >> normal: ?Lab Results  ?Component Value Date  ? TSH 1.210 07/16/2020  ?- she continues on LT4 112 mcg daily and LT3 5 mcg daily (equivalent of 132 mcg LT4 daily) ?- pt feels good on this dose.  At last visit she described  a weight gain of 16 pounds, fatigue, anxiety, chronic palpitations, problems with balance. Her TSH was perfect at last check. At this visit, she lost 3 lbs. She still has the rest of the sxs, which do not appear to be related to the thyroid, but will check again today. She tells me she prefers her TSH to be b/w 0.6-3.0. ?- we discussed about taking the thyroid hormone every day, with water, >30 minutes before breakfast, separated by >4 hours from acid reflux medications, calcium, iron, multivitamins. Pt. is taking it correctly. ?- will check thyroid tests today: TSH, fT3, and fT4 ?- If labs are abnormal, she will need to return for repeat TFTs in 1.5 months ?-Otherwise, I will see her back in a year ? ?Needs refills. ? ?Component ?    Latest Ref Rng 07/15/2021  ?TSH ?    0.35 - 5.50 uIU/mL 1.12   ?T4,Free(Direct) ?    0.60 - 1.60 ng/dL 1.16   ?Triiodothyronine,Free,Serum ?    2.3 - 4.2 pg/mL 2.7   ? ?[name], MD PhD ?[facility] ? ?

## 2021-07-15 NOTE — Patient Instructions (Signed)
Please continue  Levothyroxine 112 mcg and Liothyronine 5 mcg daily. ? ?Take the thyroid hormone every day, with water, at least 30 minutes before breakfast, separated by at least 4 hours from: ?- acid reflux medications ?- calcium ?- iron ?- multivitamins ? ?Please stop at the lab. ? ?Please return in 1 year. ? ?

## 2021-07-19 ENCOUNTER — Ambulatory Visit (INDEPENDENT_AMBULATORY_CARE_PROVIDER_SITE_OTHER): Payer: Medicare PPO | Admitting: Psychology

## 2021-07-19 DIAGNOSIS — F411 Generalized anxiety disorder: Secondary | ICD-10-CM | POA: Diagnosis not present

## 2021-07-19 NOTE — Progress Notes (Signed)
? ? ? ? ? ? ? ? ? ? ? ? ? ? ? ? ? ? ? ? ? ? ? ? ? ? ? ? ? ?Date: 07/19/2021  ?Diagnosis ?G47.00 (Insomnia disorder) [n/a]  ?300.02 (Generalized anxiety disorder) [n/a]  ?Symptoms ?Complains of difficulty remaining asleep. (Status: maintained) -- No Description Entered  ?Excessive and/or unrealistic worry that is difficult to control occurring more days than not for at least 6 months about a number of events or activities. (Status: maintained) -- No Description Entered  ?Medication Status ?compliance  ?Safety ?none  ?If Suicidal or Homicidal State Action Taken: unspecified  ?Current Risk: low ?Medications ?unspecified ?Objectives ?Related Problem: Resolve the core conflict that is the source of anxiety. ?Description: Describe situations, thoughts, feelings, and actions associated with anxieties and worries, their impact on functioning, and attempts to resolve them. ?Target Date: 2022-02-12 ?Frequency: Daily ?Modality: individual ?Progress: 60%  ?Related Problem: Resolve the core conflict that is the source of anxiety. ?Description: Learn and implement calming skills to reduce overall anxiety and manage anxiety symptoms. ?Target Date: 2022-02-12 ?Frequency: Daily ?Modality: individual ?Progress: 60% ? ?Related Problem: Resolve the core conflict that is the source of anxiety. ?Description: Learn and implement problem-solving strategies for realistically addressing worries. ?Target Date: 2022-02-12 ?Frequency: Daily ?Modality: individual ?Progress: 60% ? ?Related Problem: Resolve the core conflict that is the source of anxiety. ?Description: Maintain involvement in work, family, and social activities. ?Target Date: 2022-02-12 ?Frequency: Daily ?Modality: individual ?Progress: 50%  ?Related Problem: Restore restful sleep pattern. ?Description: Describe the history and details of sleep pattern. ?Target Date: 2021-03-15 ?Frequency: Daily ?Modality: individual ?Progress: 70% ? ? ? ?Client Response ?full compliance  ?Service  Location ?Location, [address] [place], [place] [address]  ?Service Code ?cpt [id number] P ?Self-monitoring  ?Lifestyle change (exercise, nutrition)  ?Self care activities  ?Distress tolerance skill  ?Identify/label emotions  ?Identify automatic thoughts  ?Facilitate problem solving  ?Normalize/Reframe  ?Behavioral activation plan  ?Validate/empathize  ?Comments  ?Dx.: Generalized Anxiety (F41.1)  ?Meds: No psychotropics Goals: [name] is seeking to develop a more satisfying life-style that takes into account her current physical condition. Goal date 11-04-20. States that the current situation in the world (politics and Covid 19) is making her very anxious. She is "obsessed" with the news (social media) and remains in a constant state of stress. She wants to learn to be less reactive. Will initiate behaviorally oriented individual therapy to address symptoms. Goal date is 10-04-21. While patient has realized some improvement in level of anxiety, she still expresses considerable stress and anxiety. Continues to need better balance in life. Revised date is 12-23.  ?Patient agrees to a video Webex video session due to Covid 19. She is at home and I am at my home office.  ? ?[name] is still struggling with sleeping well. She thinks it is, in part, some of her stress.She wakes up to use the restroom, but then is flooded with thoughts and struggles to get back to sleep. Nothing works to quiet her mind. Says "I get too keyed up". Meditation only works sporadically.  She was finally able to reach someone at insurance company to get an answer regarding what is covered. ?She decided to attend a wedding celebration for a family member of her ex-husband. It is positive that she is willing to expand and attend a social function.       ?               ?   ?        ?   ? ?  [name], PhD  Time: 3:10-4:00 50 minutes ?   ? ? ? ? ? ? ? ? ? ? ? ? ? ? ? ?

## 2021-07-26 ENCOUNTER — Ambulatory Visit (INDEPENDENT_AMBULATORY_CARE_PROVIDER_SITE_OTHER): Payer: Medicare PPO | Admitting: Psychology

## 2021-07-26 DIAGNOSIS — F411 Generalized anxiety disorder: Secondary | ICD-10-CM | POA: Diagnosis not present

## 2021-07-26 NOTE — Progress Notes (Signed)
      Date: 07/26/2021  Diagnosis G47.00 (Insomnia disorder) [n/a]  300.02 (Generalized anxiety disorder) [n/a]  Symptoms Complains of difficulty remaining asleep. (Status: maintained) -- No Description Entered  Excessive and/or unrealistic worry that is difficult to control occurring more days than not for at least 6 months about a number of events or activities. (Status: maintained) -- No Description Entered  Medication Status compliance  Safety none  If Suicidal or Homicidal State Action Taken: unspecified  Current Risk: low Medications unspecified Objectives Related Problem: Resolve the core conflict that is the source of anxiety. Description: Describe situations, thoughts, feelings, and actions associated with anxieties and worries, their impact on functioning, and attempts to resolve them. Target Date: 2022-02-12 Frequency: Daily Modality: individual Progress: 60%  Related Problem: Resolve the core conflict that is the source of anxiety. Description: Learn and implement calming skills to reduce overall anxiety and manage anxiety symptoms. Target Date: 2022-02-12 Frequency: Daily Modality: individual Progress: 60%  Related Problem: Resolve the core conflict that is the source of anxiety. Description: Learn and implement problem-solving strategies for realistically addressing worries. Target Date: 2022-02-12 Frequency: Daily Modality: individual Progress: 60%  Related Problem: Resolve the core conflict that is the source of anxiety. Description: Maintain involvement in work, family, and social activities. Target Date: 2022-02-12 Frequency: Daily Modality: individual Progress: 50%  Related Problem: Restore restful sleep pattern. Description: Describe the history and details of sleep pattern. Target Date: 2021-03-15 Frequency: Daily Modality: individual Progress: 70%    Client Response full compliance  Service Location Location, [address]  [place], [place] [address]  Service Code cpt [id number] P Self-monitoring  Lifestyle change (exercise, nutrition)  Self care activities  Distress tolerance skill  Identify/label emotions  Identify automatic thoughts  Facilitate problem solving  Normalize/Reframe  Behavioral activation plan  Validate/empathize  Comments  Dx.: Generalized Anxiety (F41.1)  Meds: No psychotropics Goals: [name] is seeking to develop a more satisfying life-style that takes into account her current physical condition. Goal date 11-04-20. States that the current situation in the world (politics and Covid 19) is making her very anxious. She is "obsessed" with the news (social media) and remains in a constant state of stress. She wants to learn to be less reactive. Will initiate behaviorally oriented individual therapy to address symptoms. Goal date is 10-04-21. While patient has realized some improvement in level of anxiety, she still expresses considerable stress and anxiety. Continues to need better balance in life. Revised date is 12-23.  Patient agrees to a video Webex video session due to Covid 19. She is at home and I am at my home office.   [name] says her sleep is improved, but still sporadic. She finds it relaxing to write, so she will start to journal. She states she writes on twitter regarding political commentary. Suggested that the nature of that writing is not relaxing and counters any relaxing components. Told her to focus on the poetry that she enjoys and she agrees.  [name] needs to channel all of her frustrations into action. She hopes that the focus  on poetry will serve as a great distraction.                                       Joy Morel, PhD  Time: 3:10-4:00 50 minutes

## 2021-08-02 ENCOUNTER — Ambulatory Visit (INDEPENDENT_AMBULATORY_CARE_PROVIDER_SITE_OTHER): Payer: Medicare PPO | Admitting: Psychology

## 2021-08-02 DIAGNOSIS — F411 Generalized anxiety disorder: Secondary | ICD-10-CM | POA: Diagnosis not present

## 2021-08-02 NOTE — Progress Notes (Signed)
Date: 08/02/2021  Diagnosis G47.00 (Insomnia disorder) [n/a]  300.02 (Generalized anxiety disorder) [n/a]  Symptoms Complains of difficulty remaining asleep. (Status: maintained) -- No Description Entered  Excessive and/or unrealistic worry that is difficult to control occurring more days than not for at least 6 months about a number of events or activities. (Status: maintained) -- No Description Entered  Medication Status compliance  Safety none  If Suicidal or Homicidal State Action Taken: unspecified  Current Risk: low Medications unspecified Objectives Related Problem: Resolve the core conflict that is the source of anxiety. Description: Describe situations, thoughts, feelings, and actions associated with anxieties and worries, their impact on functioning, and attempts to resolve them. Target Date: 2022-02-12 Frequency: Daily Modality: individual Progress: 60%  Related Problem: Resolve the core conflict that is the source of anxiety. Description: Learn and implement calming skills to reduce overall anxiety and manage anxiety symptoms. Target Date: 2022-02-12 Frequency: Daily Modality: individual Progress: 60%  Related Problem: Resolve the core conflict that is the source of anxiety. Description: Learn and implement problem-solving strategies for realistically addressing worries. Target Date: 2022-02-12 Frequency: Daily Modality: individual Progress: 60%  Related Problem: Resolve the core conflict that is the source of anxiety. Description: Maintain involvement in work, family, and social activities. Target Date: 2022-02-12 Frequency: Daily Modality: individual Progress: 50%  Related Problem: Restore restful sleep pattern. Description: Describe the history and details of sleep pattern. Target Date: 2021-03-15 Frequency: Daily Modality: individual Progress: 70%    Client Response full compliance  Service Location Location,  [address] [place], [place] [address]  Service Code cpt [id number] P Self-monitoring  Lifestyle change (exercise, nutrition)  Self care activities  Distress tolerance skill  Identify/label emotions  Identify automatic thoughts  Facilitate problem solving  Normalize/Reframe  Behavioral activation plan  Validate/empathize  Comments  Dx.: Generalized Anxiety (F41.1)  Meds: No psychotropics Goals: [name] is seeking to develop a more satisfying life-style that takes into account her current physical condition. Goal date 11-04-20. States that the current situation in the world (politics and Covid 19) is making her very anxious. She is "obsessed" with the news (social media) and remains in a constant state of stress. She wants to learn to be less reactive. Will initiate behaviorally oriented individual therapy to address symptoms. Goal date is 10-04-21. While patient has realized some improvement in level of anxiety, she still expresses considerable stress and anxiety. Continues to need better balance in life. Revised date is 12-23.  Patient agrees to a video Webex video session due to Covid 19. She is at home and I am at my home office.   [name] went to the party and had a nice time. She did not get overly anxious due to Covid 19. Her sister attended as well, which made it easier for [facility]. Still not comfortable going inside a restaurant, but will eat outside. Feels she needs to continue to be cautious.  She reviewed her chronic distress over the current political climate. This has been the focus of her attention for the past 6 years.  She will be going to visit nieces this week (on her ex-husband's side). He ventures out of the house is a move in the right direction.  Joy Morel, PhD  Time: 3:10-4:00 50 minutes

## 2021-08-09 ENCOUNTER — Ambulatory Visit (INDEPENDENT_AMBULATORY_CARE_PROVIDER_SITE_OTHER): Payer: Medicare PPO | Admitting: Psychology

## 2021-08-09 DIAGNOSIS — F411 Generalized anxiety disorder: Secondary | ICD-10-CM

## 2021-08-09 NOTE — Progress Notes (Signed)
Date: 08/09/2021  Diagnosis G47.00 (Insomnia disorder) [n/a]  300.02 (Generalized anxiety disorder) [n/a]  Symptoms Complains of difficulty remaining asleep. (Status: maintained) -- No Description Entered  Excessive and/or unrealistic worry that is difficult to control occurring more days than not for at least 6 months about a number of events or activities. (Status: maintained) -- No Description Entered  Medication Status compliance  Safety none  If Suicidal or Homicidal State Action Taken: unspecified  Current Risk: low Medications unspecified Objectives Related Problem: Resolve the core conflict that is the source of anxiety. Description: Describe situations, thoughts, feelings, and actions associated with anxieties and worries, their impact on functioning, and attempts to resolve them. Target Date: 2022-02-12 Frequency: Daily Modality: individual Progress: 60%  Related Problem: Resolve the core conflict that is the source of anxiety. Description: Learn and implement calming skills to reduce overall anxiety and manage anxiety symptoms. Target Date: 2022-02-12 Frequency: Daily Modality: individual Progress: 60%  Related Problem: Resolve the core conflict that is the source of anxiety. Description: Learn and implement problem-solving strategies for realistically addressing worries. Target Date: 2022-02-12 Frequency: Daily Modality: individual Progress: 60%  Related Problem: Resolve the core conflict that is the source of anxiety. Description: Maintain involvement in work, family, and social activities. Target Date: 2022-02-12 Frequency: Daily Modality: individual Progress: 50%  Related Problem: Restore restful sleep pattern. Description: Describe the history and details of sleep pattern. Target Date: 2021-03-15 Frequency: Daily Modality: individual Progress: 70%    Client Response full  compliance  Service Location Location, [address] [place], [name] [address]  Service Code cpt [phone] P Self-monitoring  Lifestyle change (exercise, nutrition)  Self care activities  Distress tolerance skill  Identify/label emotions  Identify automatic thoughts  Facilitate problem solving  Normalize/Reframe  Behavioral activation plan  Validate/empathize  Comments  Dx.: Generalized Anxiety (F41.1)  Meds: No psychotropics Goals: [name] is seeking to develop a more satisfying life-style that takes into account her current physical condition. Goal date 11-04-20. States that the current situation in the world (politics and Covid 19) is making her very anxious. She is "obsessed" with the news (social media) and remains in a constant state of stress. She wants to learn to be less reactive. Will initiate behaviorally oriented individual therapy to address symptoms. Goal date is 10-04-21. While patient has realized some improvement in level of anxiety, she still expresses considerable stress and anxiety. Continues to need better balance in life. Revised date is 12-23.  Patient agrees to a video Webex video session due to Covid 19. She is at home and I am at my home office.   [name] says that she is doing well. She went out to lunch with her 2 nieces and sister. She is also going to several [place] activities, which is positive. She is having more of a desire to get out of the house. She says she is sleeping "a little better" and is doing a better job of not worrying about things she cannot impact. She continues to procrastinate decision about her roof. Will not self impose a timeline, but is willing to set a date to make the call to get more information. Is working to improve self care under the guidance of a physical therapist.  Joy Morel, PhD  Time: 3:10-4:00 50 minutes

## 2021-08-16 ENCOUNTER — Ambulatory Visit: Payer: Medicare PPO | Admitting: Psychology

## 2021-08-23 ENCOUNTER — Ambulatory Visit (INDEPENDENT_AMBULATORY_CARE_PROVIDER_SITE_OTHER): Payer: Medicare PPO | Admitting: Psychology

## 2021-08-23 DIAGNOSIS — F411 Generalized anxiety disorder: Secondary | ICD-10-CM | POA: Diagnosis not present

## 2021-08-23 NOTE — Progress Notes (Signed)
Date: 08/23/2021  Diagnosis G47.00 (Insomnia disorder) [n/a]  300.02 (Generalized anxiety disorder) [n/a]  Symptoms Complains of difficulty remaining asleep. (Status: maintained) -- No Description Entered  Excessive and/or unrealistic worry that is difficult to control occurring more days than not for at least 6 months about a number of events or activities. (Status: maintained) -- No Description Entered  Medication Status compliance  Safety none  If Suicidal or Homicidal State Action Taken: unspecified  Current Risk: low Medications unspecified Objectives Related Problem: Resolve the core conflict that is the source of anxiety. Description: Describe situations, thoughts, feelings, and actions associated with anxieties and worries, their impact on functioning, and attempts to resolve them. Target Date: 2022-02-12 Frequency: Daily Modality: individual Progress: 60%  Related Problem: Resolve the core conflict that is the source of anxiety. Description: Learn and implement calming skills to reduce overall anxiety and manage anxiety symptoms. Target Date: 2022-02-12 Frequency: Daily Modality: individual Progress: 60%  Related Problem: Resolve the core conflict that is the source of anxiety. Description: Learn and implement problem-solving strategies for realistically addressing worries. Target Date: 2022-02-12 Frequency: Daily Modality: individual Progress: 60%  Related Problem: Resolve the core conflict that is the source of anxiety. Description: Maintain involvement in work, family, and social activities. Target Date: 2022-02-12 Frequency: Daily Modality: individual Progress: 50%  Related Problem: Restore restful sleep pattern. Description: Describe the history and details of sleep pattern. Target Date: 2021-03-15 Frequency: Daily Modality: individual Progress: 70%    Client Response full compliance  Service Location Location, [address]  [place], [place] [address]  Service Code cpt [phone] P Self-monitoring  Lifestyle change (exercise, nutrition)  Self care activities  Distress tolerance skill  Identify/label emotions  Identify automatic thoughts  Facilitate problem solving  Normalize/Reframe  Behavioral activation plan  Validate/empathize  Comments  Dx.: Generalized Anxiety (F41.1)  Meds: No psychotropics Goals: Chanique is seeking to develop a more satisfying life-style that takes into account her current physical condition. Goal date 11-04-20. States that the current situation in the world (politics and Covid 19) is making her very anxious. She is "obsessed" with the news (social media) and remains in a constant state of stress. She wants to learn to be less reactive. Will initiate behaviorally oriented individual therapy to address symptoms. Goal date is 10-04-21. While patient has realized some improvement in level of anxiety, she still expresses considerable stress and anxiety. Continues to need better balance in life. Revised date is 12-23.  Patient agrees to a video Webex video session due to Covid 19. She is at home and I am at my home office.   Herta had a birthday and she and her sister went to a program at the [place] [employer]. She is very engaged in all the news again due to recent political developments. She says that she "cannot get off of twitter". I cautioned her about once again going down the rabbit hole of infinite information. In the past, this caused her considerable angst. She will work to limit exposure. 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 Is tired much of the time. Talked about improving sleep hygiene.                                              [name], PhD  Time: 3:10-4:00 50 minutes

## 2021-08-30 ENCOUNTER — Ambulatory Visit (INDEPENDENT_AMBULATORY_CARE_PROVIDER_SITE_OTHER): Payer: Medicare PPO | Admitting: Psychology

## 2021-08-30 DIAGNOSIS — F411 Generalized anxiety disorder: Secondary | ICD-10-CM

## 2021-09-07 ENCOUNTER — Ambulatory Visit (INDEPENDENT_AMBULATORY_CARE_PROVIDER_SITE_OTHER): Payer: Medicare PPO | Admitting: Psychology

## 2021-09-07 DIAGNOSIS — F411 Generalized anxiety disorder: Secondary | ICD-10-CM | POA: Diagnosis not present

## 2021-09-07 NOTE — Progress Notes (Signed)
Date: 09/07/2021  Diagnosis G47.00 (Insomnia disorder) [n/a]  300.02 (Generalized anxiety disorder) [n/a]  Symptoms Complains of difficulty remaining asleep. (Status: maintained) -- No Description Entered  Excessive and/or unrealistic worry that is difficult to control occurring more days than not for at least 6 months about a number of events or activities. (Status: maintained) -- No Description Entered  Medication Status compliance  Safety none  If Suicidal or Homicidal State Action Taken: unspecified  Current Risk: low Medications unspecified Objectives Related Problem: Resolve the core conflict that is the source of anxiety. Description: Describe situations, thoughts, feelings, and actions associated with anxieties and worries, their impact on functioning, and attempts to resolve them. Target Date: 2022-02-12 Frequency: Daily Modality: individual Progress: 60%  Related Problem: Resolve the core conflict that is the source of anxiety. Description: Learn and implement calming skills to reduce overall anxiety and manage anxiety symptoms. Target Date: 2022-02-12 Frequency: Daily Modality: individual Progress: 60%  Related Problem: Resolve the core conflict that is the source of anxiety. Description: Learn and implement problem-solving strategies for realistically addressing worries. Target Date: 2022-02-12 Frequency: Daily Modality: individual Progress: 60%  Related Problem: Resolve the core conflict that is the source of anxiety. Description: Maintain involvement in work, family, and social activities. Target Date: 2022-02-12 Frequency: Daily Modality: individual Progress: 50%  Related Problem: Restore restful sleep pattern. Description: Describe the history and details of sleep pattern. Target Date: 2021-03-15 Frequency: Daily Modality: individual Progress: 70%    Client Response full compliance  Service Location Location, [address] [place], [place] [address]  Service Code cpt [phone] P Self-monitoring  Lifestyle change (exercise, nutrition)  Self care activities  Distress tolerance skill  Identify/label emotions  Identify automatic thoughts  Facilitate problem solving  Normalize/Reframe  Behavioral activation plan  Validate/empathize  Comments  Dx.: Generalized Anxiety (F41.1)  Meds: No psychotropics Goals: [name] is seeking to develop a more satisfying life-style that takes into account her current physical condition. Goal date 11-04-20. States that the current situation in the world (politics and Covid 19) is making her very anxious. She is "obsessed" with the news (social media) and remains in a constant state of stress. She wants to learn to be less reactive. Will initiate behaviorally oriented individual therapy to address symptoms. Goal date is 10-04-21. While patient has realized some improvement in level of anxiety, she still expresses considerable stress and anxiety. Continues to need better balance in life. Revised date is 12-23.  Patient agrees to a Engineer, structural session. She is at home and I am at my home office.   [name] says that she and her sister chose not to go into crowded areas for the holiday. She continues to obsess over some home repairs that she has been exploring for months. Suggested that she set hard deadlines and follow through. She is very indecisive. She talked about her financial and medical future. She is very cautious with both and taking steps to be as prepared as possible. Her energy is depleted and her worry over finances constant. Discussed cognitive strategies to reduce the angst.                                                      Joy Morel, PhD  Time: 3:10-4:00 50 minutes

## 2021-09-13 ENCOUNTER — Ambulatory Visit (INDEPENDENT_AMBULATORY_CARE_PROVIDER_SITE_OTHER): Payer: Medicare PPO | Admitting: Psychology

## 2021-09-13 DIAGNOSIS — F411 Generalized anxiety disorder: Secondary | ICD-10-CM

## 2021-09-13 NOTE — Progress Notes (Signed)
Date: 09/13/2021  Diagnosis G47.00 (Insomnia disorder) [n/a]  300.02 (Generalized anxiety disorder) [n/a]  Symptoms Complains of difficulty remaining asleep. (Status: maintained) -- No Description Entered  Excessive and/or unrealistic worry that is difficult to control occurring more days than not for at least 6 months about a number of events or activities. (Status: maintained) -- No Description Entered  Medication Status compliance  Safety none  If Suicidal or Homicidal State Action Taken: unspecified  Current Risk: low Medications unspecified Objectives Related Problem: Resolve the core conflict that is the source of anxiety. Description: Describe situations, thoughts, feelings, and actions associated with anxieties and worries, their impact on functioning, and attempts to resolve them. Target Date: 2022-02-12 Frequency: Daily Modality: individual Progress: 60%  Related Problem: Resolve the core conflict that is the source of anxiety. Description: Learn and implement calming skills to reduce overall anxiety and manage anxiety symptoms. Target Date: 2022-02-12 Frequency: Daily Modality: individual Progress: 60%  Related Problem: Resolve the core conflict that is the source of anxiety. Description: Learn and implement problem-solving strategies for realistically addressing worries. Target Date: 2022-02-12 Frequency: Daily Modality: individual Progress: 60%  Related Problem: Resolve the core conflict that is the source of anxiety. Description: Maintain involvement in work, family, and social activities. Target Date: 2022-02-12 Frequency: Daily Modality: individual Progress: 50%  Related Problem: Restore restful sleep pattern. Description: Describe the history and details of sleep pattern. Target Date: 2021-03-15 Frequency: Daily Modality: individual Progress: 70%    Client Response full compliance  Service  Location Location, [address] [place], [place] [address]  Service Code cpt [phone] P Self-monitoring  Lifestyle change (exercise, nutrition)  Self care activities  Distress tolerance skill  Identify/label emotions  Identify automatic thoughts  Facilitate problem solving  Normalize/Reframe  Behavioral activation plan  Validate/empathize  Comments  Dx.: Generalized Anxiety (F41.1)  Meds: No psychotropics Goals: [name] is seeking to develop a more satisfying life-style that takes into account her current physical condition. Goal date 11-04-20. States that the current situation in the world (politics and Covid 19) is making her very anxious. She is "obsessed" with the news (social media) and remains in a constant state of stress. She wants to learn to be less reactive. Will initiate behaviorally oriented individual therapy to address symptoms. Goal date is 10-04-21. While patient has realized some improvement in level of anxiety, she still expresses considerable stress and anxiety. Continues to need better balance in life. Revised date is 12-23.  Patient agrees to a Engineer, structural session. She is at home and I am at my home office.   [name] recognizes that her fears of making a mistake impedes her ability to make a choice. We discussed how she can move past this fear by paying attention to her instincts and go with what she feels is best. She has a history of being insecure and an extreme perfectionist. We talked about her need to have a direction and focus on how she wants to spend her remaining years. 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 Is involved in monthly group, but will seek additional opportunities and hopes to increase her writing.  Joy Morel, PhD  Time: 3:10-4:00 50 minutes

## 2021-09-20 ENCOUNTER — Ambulatory Visit (INDEPENDENT_AMBULATORY_CARE_PROVIDER_SITE_OTHER): Payer: Medicare PPO | Admitting: Psychology

## 2021-09-20 DIAGNOSIS — F411 Generalized anxiety disorder: Secondary | ICD-10-CM

## 2021-09-20 NOTE — Progress Notes (Signed)
Date: 09/20/2021  Diagnosis G47.00 (Insomnia disorder) [n/a]  300.02 (Generalized anxiety disorder) [n/a]  Symptoms Complains of difficulty remaining asleep. (Status: maintained) -- No Description Entered  Excessive and/or unrealistic worry that is difficult to control occurring more days than not for at least 6 months about a number of events or activities. (Status: maintained) -- No Description Entered  Medication Status compliance  Safety none  If Suicidal or Homicidal State Action Taken: unspecified  Current Risk: low Medications unspecified Objectives Related Problem: Resolve the core conflict that is the source of anxiety. Description: Describe situations, thoughts, feelings, and actions associated with anxieties and worries, their impact on functioning, and attempts to resolve them. Target Date: 2022-02-12 Frequency: Daily Modality: individual Progress: 60%  Related Problem: Resolve the core conflict that is the source of anxiety. Description: Learn and implement calming skills to reduce overall anxiety and manage anxiety symptoms. Target Date: 2022-02-12 Frequency: Daily Modality: individual Progress: 60%  Related Problem: Resolve the core conflict that is the source of anxiety. Description: Learn and implement problem-solving strategies for realistically addressing worries. Target Date: 2022-02-12 Frequency: Daily Modality: individual Progress: 60%  Related Problem: Resolve the core conflict that is the source of anxiety. Description: Maintain involvement in work, family, and social activities. Target Date: 2022-02-12 Frequency: Daily Modality: individual Progress: 50%  Related Problem: Restore restful sleep pattern. Description: Describe the history and details of sleep pattern. Target Date: 2021-03-15 Frequency: Daily Modality: individual Progress: 70%    Client Response full compliance  Service  Location Location, [address] [place], [place] [address]  Service Code cpt [phone] P Self-monitoring  Lifestyle change (exercise, nutrition)  Self care activities  Distress tolerance skill  Identify/label emotions  Identify automatic thoughts  Facilitate problem solving  Normalize/Reframe  Behavioral activation plan  Validate/empathize  Comments  Dx.: Generalized Anxiety (F41.1)  Meds: No psychotropics Goals: Mckenlee is seeking to develop a more satisfying life-style that takes into account her current physical condition. Goal date 11-04-20. States that the current situation in the world (politics and Covid 19) is making her very anxious. She is "obsessed" with the news (social media) and remains in a constant state of stress. She wants to learn to be less reactive. Will initiate behaviorally oriented individual therapy to address symptoms. Goal date is 10-04-21. While patient has realized some improvement in level of anxiety, she still expresses considerable stress and anxiety. Continues to need better balance in life. Revised date is 12-23.  Patient agrees to a Engineer, structural session. She is at home and I am at my home office.   Lavanda is feeling good that she got new glasses.Working on self care and eating well. She talked about good food choices. She is still struggling with staying asleep. When she wakes up, it is difficult to get back to sleep. Calming strategies do not seem to work. She is trying, but ends up distracted and spending time on line. She says she has many things in her life that are positive. Her fatigue is the worst part of her day and it is discouraging because it compromises her efficiency. She is also very indecisive due to her perfectionism. She says she is emotionally attached to this notion.  [name], PhD  Time: 3:10-4:00 50 minutes

## 2021-09-27 ENCOUNTER — Ambulatory Visit (INDEPENDENT_AMBULATORY_CARE_PROVIDER_SITE_OTHER): Payer: Medicare PPO | Admitting: Psychology

## 2021-09-27 DIAGNOSIS — F411 Generalized anxiety disorder: Secondary | ICD-10-CM

## 2021-09-27 NOTE — Progress Notes (Signed)
Date: 09/27/2021  Diagnosis G47.00 (Insomnia disorder) [n/a]  300.02 (Generalized anxiety disorder) [n/a]  Symptoms Complains of difficulty remaining asleep. (Status: maintained) -- No Description Entered  Excessive and/or unrealistic worry that is difficult to control occurring more days than not for at least 6 months about a number of events or activities. (Status: maintained) -- No Description Entered  Medication Status compliance  Safety none  If Suicidal or Homicidal State Action Taken: unspecified  Current Risk: low Medications unspecified Objectives Related Problem: Resolve the core conflict that is the source of anxiety. Description: Describe situations, thoughts, feelings, and actions associated with anxieties and worries, their impact on functioning, and attempts to resolve them. Target Date: 2022-02-12 Frequency: Daily Modality: individual Progress: 60%  Related Problem: Resolve the core conflict that is the source of anxiety. Description: Learn and implement calming skills to reduce overall anxiety and manage anxiety symptoms. Target Date: 2022-02-12 Frequency: Daily Modality: individual Progress: 60%  Related Problem: Resolve the core conflict that is the source of anxiety. Description: Learn and implement problem-solving strategies for realistically addressing worries. Target Date: 2022-02-12 Frequency: Daily Modality: individual Progress: 60%  Related Problem: Resolve the core conflict that is the source of anxiety. Description: Maintain involvement in work, family, and social activities. Target Date: 2022-02-12 Frequency: Daily Modality: individual Progress: 50%  Related Problem: Restore restful sleep pattern. Description: Describe the history and details of sleep pattern. Target Date: 2021-03-15 Frequency: Daily Modality: individual Progress: 70%    Client Response full compliance  Service Location Location, [address] [place], [place] [address]  Service Code cpt [id number] P Self-monitoring  Lifestyle change (exercise, nutrition)  Self care activities  Distress tolerance skill  Identify/label emotions  Identify automatic thoughts  Facilitate problem solving  Normalize/Reframe  Behavioral activation plan  Validate/empathize  Comments  Dx.: Generalized Anxiety (F41.1)  Meds: No psychotropics Goals: [name] is seeking to develop a more satisfying life-style that takes into account her current physical condition. Goal date 11-04-20. States that the current situation in the world (politics and Covid 19) is making her very anxious. She is "obsessed" with the news (social media) and remains in a constant state of stress. She wants to learn to be less reactive. Will initiate behaviorally oriented individual therapy to address symptoms. Goal date is 10-04-21. While patient has realized some improvement in level of anxiety, she still expresses considerable stress and anxiety. Continues to need better balance in life. Revised date is 12-23.  Patient agrees to a Engineer, structural session. She is at home and I am at my home office.   [name] says she and her sister are going out for dinner tonight. This makes her very happy to get out of her home. 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 This is a big relief to her. Says she still needs to hear from the insurance company before work is initiated. These types of decisions are very difficult for [name]. She talked about the ways that watching television helps relieve much of her anxiety. She recognizes that watching politics is, at times, very upsetting. As a whole, however, she thinks it serves her well in managing her agitation.  Joy Morel, PhD  Time: 3:10-4:00 50 minutes

## 2021-10-04 ENCOUNTER — Ambulatory Visit (INDEPENDENT_AMBULATORY_CARE_PROVIDER_SITE_OTHER): Payer: Medicare PPO | Admitting: Psychology

## 2021-10-04 DIAGNOSIS — F411 Generalized anxiety disorder: Secondary | ICD-10-CM | POA: Diagnosis not present

## 2021-10-04 NOTE — Progress Notes (Signed)
Date: 10/04/2021  Diagnosis G47.00 (Insomnia disorder) [n/a]  300.02 (Generalized anxiety disorder) [n/a]  Symptoms Complains of difficulty remaining asleep. (Status: maintained) -- No Description Entered  Excessive and/or unrealistic worry that is difficult to control occurring more days than not for at least 6 months about a number of events or activities. (Status: maintained) -- No Description Entered  Medication Status compliance  Safety none  If Suicidal or Homicidal State Action Taken: unspecified  Current Risk: low Medications unspecified Objectives Related Problem: Resolve the core conflict that is the source of anxiety. Description: Describe situations, thoughts, feelings, and actions associated with anxieties and worries, their impact on functioning, and attempts to resolve them. Target Date: 2022-02-12 Frequency: Daily Modality: individual Progress: 60%  Related Problem: Resolve the core conflict that is the source of anxiety. Description: Learn and implement calming skills to reduce overall anxiety and manage anxiety symptoms. Target Date: 2022-02-12 Frequency: Daily Modality: individual Progress: 60%  Related Problem: Resolve the core conflict that is the source of anxiety. Description: Learn and implement problem-solving strategies for realistically addressing worries. Target Date: 2022-02-12 Frequency: Daily Modality: individual Progress: 60%  Related Problem: Resolve the core conflict that is the source of anxiety. Description: Maintain involvement in work, family, and social activities. Target Date: 2022-02-12 Frequency: Daily Modality: individual Progress: 50%  Related Problem: Restore restful sleep pattern. Description: Describe the history and details of sleep pattern. Target Date: 2021-03-15 Frequency: Daily Modality: individual Progress: 70%    Client Response full compliance  Service  Location Location, [address] [place], [place] [address]  Service Code cpt [phone] P Self-monitoring  Lifestyle change (exercise, nutrition)  Self care activities  Distress tolerance skill  Identify/label emotions  Identify automatic thoughts  Facilitate problem solving  Normalize/Reframe  Behavioral activation plan  Validate/empathize  Comments  Dx.: Generalized Anxiety (F41.1)  Meds: No psychotropics Goals: [name] is seeking to develop a more satisfying life-style that takes into account her current physical condition. Goal date 11-04-20. States that the current situation in the world (politics and Covid 19) is making her very anxious. She is "obsessed" with the news (social media) and remains in a constant state of stress. She wants to learn to be less reactive. Will initiate behaviorally oriented individual therapy to address symptoms. Goal date is 10-04-21. While patient has realized some improvement in level of anxiety, she still expresses considerable stress and anxiety. Continues to need better balance in life. Revised date is 12-23.  Patient agrees to a Engineer, structural session. She is at home and I am at my home office.   [name] says that she ran into a problem with her roofer that will delay the project until it gets resolved. Her sleep has been inconsistent and says that she needs at least 7 hours to feel good. That rarely happens and she is fatigued most of the time. Doctor does not want to give her sleeping medication. The pharmacist told her it will give her 6 hours but she says she gets 6 hours already. She will attempt relaxation strategies. We talked about the need for her to, again, monitor her involvement with exposure to news media political stories. It tends to consume her.  Joy Morel, PhD  Time: 12:40p-1:30p 50 minutes

## 2021-10-11 ENCOUNTER — Ambulatory Visit (INDEPENDENT_AMBULATORY_CARE_PROVIDER_SITE_OTHER): Payer: Medicare PPO | Admitting: Psychology

## 2021-10-11 DIAGNOSIS — F411 Generalized anxiety disorder: Secondary | ICD-10-CM | POA: Diagnosis not present

## 2021-10-11 NOTE — Progress Notes (Signed)
Date: 10/11/2021  Diagnosis G47.00 (Insomnia disorder) [n/a]  300.02 (Generalized anxiety disorder) [n/a]  Symptoms Complains of difficulty remaining asleep. (Status: maintained) -- No Description Entered  Excessive and/or unrealistic worry that is difficult to control occurring more days than not for at least 6 months about a number of events or activities. (Status: maintained) -- No Description Entered  Medication Status compliance  Safety none  If Suicidal or Homicidal State Action Taken: unspecified  Current Risk: low Medications unspecified Objectives Related Problem: Resolve the core conflict that is the source of anxiety. Description: Describe situations, thoughts, feelings, and actions associated with anxieties and worries, their impact on functioning, and attempts to resolve them. Target Date: 2022-02-12 Frequency: Daily Modality: individual Progress: 60%  Related Problem: Resolve the core conflict that is the source of anxiety. Description: Learn and implement calming skills to reduce overall anxiety and manage anxiety symptoms. Target Date: 2022-02-12 Frequency: Daily Modality: individual Progress: 60%  Related Problem: Resolve the core conflict that is the source of anxiety. Description: Learn and implement problem-solving strategies for realistically addressing worries. Target Date: 2022-02-12 Frequency: Daily Modality: individual Progress: 60%  Related Problem: Resolve the core conflict that is the source of anxiety. Description: Maintain involvement in work, family, and social activities. Target Date: 2022-02-12 Frequency: Daily Modality: individual Progress: 50%  Related Problem: Restore restful sleep pattern. Description: Describe the history and details of sleep pattern. Target Date: 2021-03-15 Frequency: Daily Modality: individual Progress: 70%    Client  Response full compliance  Service Location Location, [address] [place], [place] [address]  Service Code cpt [phone] P Self-monitoring  Lifestyle change (exercise, nutrition)  Self care activities  Distress tolerance skill  Identify/label emotions  Identify automatic thoughts  Facilitate problem solving  Normalize/Reframe  Behavioral activation plan  Validate/empathize  Comments  Dx.: Generalized Anxiety (F41.1)  Meds: No psychotropics Goals: Avriel is seeking to develop a more satisfying life-style that takes into account her current physical condition. Goal date 11-04-20. States that the current situation in the world (politics and Covid 19) is making her very anxious. She is "obsessed" with the news (social media) and remains in a constant state of stress. She wants to learn to be less reactive. Will initiate behaviorally oriented individual therapy to address symptoms. Goal date is 10-04-21. While patient has realized some improvement in level of anxiety, she still expresses considerable stress and anxiety. Continues to need better balance in life. Revised date is 12-23.  Patient agrees to a Engineer, structural session. She is at home and I am at my home office.   Judieth says she had a nightmare about her sister and thinks it was because she watched a murder mystery last night. She says she is working hard at self-care, particularly with exercise and physical therapy. She wants to start working on a poem since she has joining a poetry group. She will also go to [employer] at [employer] this week.  [name], PhD  Time: 3:15p-4:00p 45 minutes

## 2021-10-18 ENCOUNTER — Ambulatory Visit (INDEPENDENT_AMBULATORY_CARE_PROVIDER_SITE_OTHER): Payer: Medicare PPO | Admitting: Psychology

## 2021-10-18 DIAGNOSIS — F411 Generalized anxiety disorder: Secondary | ICD-10-CM | POA: Diagnosis not present

## 2021-10-18 NOTE — Progress Notes (Signed)
Date: 10/18/2021  Diagnosis G47.00 (Insomnia disorder) [n/a]  300.02 (Generalized anxiety disorder) [n/a]  Symptoms Complains of difficulty remaining asleep. (Status: maintained) -- No Description Entered  Excessive and/or unrealistic worry that is difficult to control occurring more days than not for at least 6 months about a number of events or activities. (Status: maintained) -- No Description Entered  Medication Status compliance  Safety none  If Suicidal or Homicidal State Action Taken: unspecified  Current Risk: low Medications unspecified Objectives Related Problem: Resolve the core conflict that is the source of anxiety. Description: Describe situations, thoughts, feelings, and actions associated with anxieties and worries, their impact on functioning, and attempts to resolve them. Target Date: 2022-02-12 Frequency: Daily Modality: individual Progress: 60%  Related Problem: Resolve the core conflict that is the source of anxiety. Description: Learn and implement calming skills to reduce overall anxiety and manage anxiety symptoms. Target Date: 2022-02-12 Frequency: Daily Modality: individual Progress: 60%  Related Problem: Resolve the core conflict that is the source of anxiety. Description: Learn and implement problem-solving strategies for realistically addressing worries. Target Date: 2022-02-12 Frequency: Daily Modality: individual Progress: 60%  Related Problem: Resolve the core conflict that is the source of anxiety. Description: Maintain involvement in work, family, and social activities. Target Date: 2022-02-12 Frequency: Daily Modality: individual Progress: 50%  Related Problem: Restore restful sleep pattern. Description: Describe the history and details of sleep pattern. Target Date: 2021-03-15 Frequency: Daily Modality:  individual Progress: 70%    Client Response full compliance  Service Location Location, [address] [place], [place] [address]  Service Code cpt [phone] P Self-monitoring  Lifestyle change (exercise, nutrition)  Self care activities  Distress tolerance skill  Identify/label emotions  Identify automatic thoughts  Facilitate problem solving  Normalize/Reframe  Behavioral activation plan  Validate/empathize  Comments  Dx.: Generalized Anxiety (F41.1)  Meds: No psychotropics Goals: Domnique is seeking to develop a more satisfying life-style that takes into account her current physical condition. Goal date 11-04-20. States that the current situation in the world (politics and Covid 19) is making her very anxious. She is "obsessed" with the news (social media) and remains in a constant state of stress. She wants to learn to be less reactive. Will initiate behaviorally oriented individual therapy to address symptoms. Goal date is 10-04-21. While patient has realized some improvement in level of anxiety, she still expresses considerable stress and anxiety. Continues to need better balance in life. Revised date is 12-23.  Patient agrees to a Engineer, structural session. She is at home and I am at my home office.   Mylea states she is in a good mood today because of the Trump indictments. She spends considerable time following all the political/legal proceedings. She is optimistic, and it helps alleviate much of her angst. She does say that her sleep remains inconsistent. Has a hard time falling back to sleep after getting up for the bathroom. She talked about her desire to remain cautious (from Covid 19) and reluctance to travel anywhere. She says she has been low on energy but does not think it is emotionally based. Rather she feels it is physical and related to her  sleep. Talked about the need to be more physically active. She will strive to walk more and get more exercise.                                                                              [name], PhD  Time: 3:15p-4:00p 45 minutes

## 2021-10-25 ENCOUNTER — Ambulatory Visit (INDEPENDENT_AMBULATORY_CARE_PROVIDER_SITE_OTHER): Payer: Medicare PPO | Admitting: Psychology

## 2021-10-25 DIAGNOSIS — F411 Generalized anxiety disorder: Secondary | ICD-10-CM | POA: Diagnosis not present

## 2021-10-25 NOTE — Progress Notes (Addendum)
Date: 10/25/2021  Diagnosis G47.00 (Insomnia disorder) [n/a]  300.02 (Generalized anxiety disorder) [n/a]  Symptoms Complains of difficulty remaining asleep. (Status: maintained) -- No Description Entered  Excessive and/or unrealistic worry that is difficult to control occurring more days than not for at least 6 months about a number of events or activities. (Status: maintained) -- No Description Entered  Medication Status compliance  Safety none  If Suicidal or Homicidal State Action Taken: unspecified  Current Risk: low Medications unspecified Objectives Related Problem: Resolve the core conflict that is the source of anxiety. Description: Describe situations, thoughts, feelings, and actions associated with anxieties and worries, their impact on functioning, and attempts to resolve them. Target Date: 2022-02-12 Frequency: Daily Modality: individual Progress: 80%  Related Problem: Resolve the core conflict that is the source of anxiety. Description: Learn and implement calming skills to reduce overall anxiety and manage anxiety symptoms. Target Date: 2022-02-12 Frequency: Daily Modality: individual Progress: 70%  Related Problem: Resolve the core conflict that is the source of anxiety. Description: Learn and implement problem-solving strategies for realistically addressing worries. Target Date: 2022-02-12 Frequency: Daily Modality: individual Progress: 70%  Related Problem: Resolve the core conflict that is the source of anxiety. Description: Maintain involvement in work, family, and social activities. Target Date: 2022-02-12 Frequency: Daily Modality: individual Progress: 70%  Related Problem: Restore restful sleep pattern. Description: Describe the history and details of sleep pattern. Target Date: 2022-03-15 Frequency: Daily Modality: individual Progress: 70%    Client Response full compliance  Service Location Location, [address] [place], [place] [address]  Service Code cpt [phone] P Self-monitoring  Lifestyle change (exercise, nutrition)  Self care activities  Distress tolerance skill  Identify/label emotions  Identify automatic thoughts  Facilitate problem solving  Normalize/Reframe  Behavioral activation plan  Validate/empathize  Comments  Dx.: Generalized Anxiety (F41.1)  Meds: No psychotropics Goals: [name] is seeking to develop a more satisfying life-style that takes into account her current physical condition. Goal date 11-04-20. States that the current situation in the world (politics and Covid 19) is making her very anxious. She is "obsessed" with the news (social media) and remains in a constant state of stress. She wants to learn to be less reactive. Will initiate behaviorally oriented individual therapy to address symptoms. Goal date is 10-04-21. While patient has realized some improvement in level of anxiety, she still expresses considerable stress and anxiety. Continues to need better balance in life. Revised date is 12-23.  Patient agrees to a Engineer, structural session. She is at home and I am at my home office.   [name] states that her sleep has been disturbed and she is waking up at 1 or 2 in the morning. She sometimes struggles to get back to sleep. Not able to identify any reason and does not report having disturbing thoughts or perseveration that is causing the sleep problems. Sometimes music helps her (get back to) sleep. [name] is still working to have the repairs to her home completed. This has been a somewhat consuming project. She is trying to monitor her exposure to media/new with regard to politics. This is helping her be less agitated.  Joy Morel, PhD  Time: 3:15p-4:00p 45 minutes

## 2021-11-01 ENCOUNTER — Ambulatory Visit (INDEPENDENT_AMBULATORY_CARE_PROVIDER_SITE_OTHER): Payer: Medicare PPO | Admitting: Psychology

## 2021-11-01 DIAGNOSIS — F411 Generalized anxiety disorder: Secondary | ICD-10-CM | POA: Diagnosis not present

## 2021-11-01 NOTE — Progress Notes (Signed)
Date: 11/01/2021  Diagnosis G47.00 (Insomnia disorder) [n/a]  300.02 (Generalized anxiety disorder) [n/a]  Symptoms Complains of difficulty remaining asleep. (Status: maintained) -- No Description Entered  Excessive and/or unrealistic worry that is difficult to control occurring more days than not for at least 6 months about a number of events or activities. (Status: maintained) -- No Description Entered  Medication Status compliance  Safety none  If Suicidal or Homicidal State Action Taken: unspecified  Current Risk: low Medications unspecified Objectives Related Problem: Resolve the core conflict that is the source of anxiety. Description: Describe situations, thoughts, feelings, and actions associated with anxieties and worries, their impact on functioning, and attempts to resolve them. Target Date: 2022-02-12 Frequency: Daily Modality: individual Progress: 80%  Related Problem: Resolve the core conflict that is the source of anxiety. Description: Learn and implement calming skills to reduce overall anxiety and manage anxiety symptoms. Target Date: 2022-02-12 Frequency: Daily Modality: individual Progress: 70%  Related Problem: Resolve the core conflict that is the source of anxiety. Description: Learn and implement problem-solving strategies for realistically addressing worries. Target Date: 2022-02-12 Frequency: Daily Modality: individual Progress: 70%  Related Problem: Resolve the core conflict that is the source of anxiety. Description: Maintain involvement in work, family, and social activities. Target Date: 2022-02-12 Frequency: Daily Modality: individual Progress: 70%  Related Problem: Restore restful sleep pattern. Description: Describe the history and details of sleep pattern. Target Date: 2022-03-15 Frequency: Daily Modality: individual Progress: 70%    Client Response full compliance  Service Location Location, [address] [place],  [place] [address]  Service Code cpt [phone] P Self-monitoring  Lifestyle change (exercise, nutrition)  Self care activities  Distress tolerance skill  Identify/label emotions  Identify automatic thoughts  Facilitate problem solving  Normalize/Reframe  Behavioral activation plan  Validate/empathize  Comments  Dx.: Generalized Anxiety (F41.1)  Meds: No psychotropics Goals: [name] is seeking to develop a more satisfying life-style that takes into account her current physical condition. Goal date 11-04-20. States that the current situation in the world (politics and Covid 19) is making her very anxious. She is "obsessed" with the news (social media) and remains in a constant state of stress. She wants to learn to be less reactive. Will initiate behaviorally oriented individual therapy to address symptoms. Goal date is 10-04-21. While patient has realized some improvement in level of anxiety, she still expresses considerable stress and anxiety. Continues to need better balance in life. Revised date is 12-23.  Patient agrees to a Engineer, structural session. She is at home and I am at my home office.   [name] is very happy that she is finally able to get the roof completed. She says she "finally slept good last night". She has had a few good nights, which is an improvement. She wrote two poems since we last met. This is an indication of progress in that she is regaining interest. 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 Wants to submit poems for publication.  Joy Morel, PhD  Time: 3:15p-4:00p 45 minutes

## 2021-11-08 ENCOUNTER — Ambulatory Visit: Payer: Medicare PPO | Admitting: Psychology

## 2021-11-09 ENCOUNTER — Ambulatory Visit (INDEPENDENT_AMBULATORY_CARE_PROVIDER_SITE_OTHER): Payer: Medicare PPO | Admitting: Psychology

## 2021-11-09 DIAGNOSIS — F411 Generalized anxiety disorder: Secondary | ICD-10-CM

## 2021-11-09 NOTE — Progress Notes (Signed)
Date: 11/09/2021  Diagnosis G47.00 (Insomnia disorder) [n/a]  300.02 (Generalized anxiety disorder) [n/a]  Symptoms Complains of difficulty remaining asleep. (Status: maintained) -- No Description Entered  Excessive and/or unrealistic worry that is difficult to control occurring more days than not for at least 6 months about a number of events or activities. (Status: maintained) -- No Description Entered  Medication Status compliance  Safety none  If Suicidal or Homicidal State Action Taken: unspecified  Current Risk: low Medications unspecified Objectives Related Problem: Resolve the core conflict that is the source of anxiety. Description: Describe situations, thoughts, feelings, and actions associated with anxieties and worries, their impact on functioning, and attempts to resolve them. Target Date: 2022-02-12 Frequency: Daily Modality: individual Progress: 80%  Related Problem: Resolve the core conflict that is the source of anxiety. Description: Learn and implement calming skills to reduce overall anxiety and manage anxiety symptoms. Target Date: 2022-02-12 Frequency: Daily Modality: individual Progress: 70%  Related Problem: Resolve the core conflict that is the source of anxiety. Description: Learn and implement problem-solving strategies for realistically addressing worries. Target Date: 2022-02-12 Frequency: Daily Modality: individual Progress: 70%  Related Problem: Resolve the core conflict that is the source of anxiety. Description: Maintain involvement in work, family, and social activities. Target Date: 2022-02-12 Frequency: Daily Modality: individual Progress: 70%  Related Problem: Restore restful sleep pattern. Description: Describe the history and details of sleep pattern. Target Date: 2022-03-15 Frequency: Daily Modality: individual Progress: 70%    Client Response full compliance  Service Location Location, [address] [place], [place] [address]  Service Code cpt [phone] P Self-monitoring  Lifestyle change (exercise, nutrition)  Self care activities  Distress tolerance skill  Identify/label emotions  Identify automatic thoughts  Facilitate problem solving  Normalize/Reframe  Behavioral activation plan  Validate/empathize  Comments  Dx.: Generalized Anxiety (F41.1)  Meds: No psychotropics Goals: [name] is seeking to develop a more satisfying life-style that takes into account her current physical condition. Goal date 11-04-20. States that the current situation in the world (politics and Covid 19) is making her very anxious. She is "obsessed" with the news (social media) and remains in a constant state of stress. She wants to learn to be less reactive. Will initiate behaviorally oriented individual therapy to address symptoms. Goal date is 10-04-21. While patient has realized some improvement in level of anxiety, she still expresses considerable stress and anxiety. Continues to need better balance in life. Revised date is 12-23.  Patient agrees to a Engineer, structural session. She is at home and I am at my home office.   [name] is very excited that she has joined a new poetry group. This is a group she is paying for and involves a number of other poets. Has great enthusiasm and is energized. She plans to continue writing and connecting with other group members. This is an indication of progress in her being socially connected. She says that poetry, for her, has been a great outlet for her stress and this group is filling a void created by Covid 19.  Joy Morel, PhD  Time: 3:15p-4:00p 45 minutes

## 2021-11-10 DIAGNOSIS — I10 Essential (primary) hypertension: Secondary | ICD-10-CM | POA: Diagnosis not present

## 2021-11-11 DIAGNOSIS — Z0000 Encounter for general adult medical examination without abnormal findings: Secondary | ICD-10-CM | POA: Diagnosis not present

## 2021-11-11 DIAGNOSIS — M85861 Other specified disorders of bone density and structure, right lower leg: Secondary | ICD-10-CM | POA: Diagnosis not present

## 2021-11-11 DIAGNOSIS — Z1331 Encounter for screening for depression: Secondary | ICD-10-CM | POA: Diagnosis not present

## 2021-11-11 DIAGNOSIS — G4733 Obstructive sleep apnea (adult) (pediatric): Secondary | ICD-10-CM | POA: Diagnosis not present

## 2021-11-11 DIAGNOSIS — Z86010 Personal history of colonic polyps: Secondary | ICD-10-CM | POA: Diagnosis not present

## 2021-11-11 DIAGNOSIS — R609 Edema, unspecified: Secondary | ICD-10-CM | POA: Diagnosis not present

## 2021-11-11 DIAGNOSIS — N3946 Mixed incontinence: Secondary | ICD-10-CM | POA: Diagnosis not present

## 2021-11-11 DIAGNOSIS — I10 Essential (primary) hypertension: Secondary | ICD-10-CM | POA: Diagnosis not present

## 2021-11-15 ENCOUNTER — Ambulatory Visit (INDEPENDENT_AMBULATORY_CARE_PROVIDER_SITE_OTHER): Payer: Medicare PPO | Admitting: Psychology

## 2021-11-15 DIAGNOSIS — F411 Generalized anxiety disorder: Secondary | ICD-10-CM
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
## 2021-11-22 ENCOUNTER — Ambulatory Visit (INDEPENDENT_AMBULATORY_CARE_PROVIDER_SITE_OTHER): Payer: Medicare PPO | Admitting: Psychology

## 2021-11-22 DIAGNOSIS — F411 Generalized anxiety disorder: Secondary | ICD-10-CM

## 2021-11-22 NOTE — Progress Notes (Signed)
Date: 11/22/2021  Diagnosis G47.00 (Insomnia disorder) [n/a]  300.02 (Generalized anxiety disorder) [n/a]  Symptoms Complains of difficulty remaining asleep. (Status: maintained) -- No Description Entered  Excessive and/or unrealistic worry that is difficult to control occurring more days than not for at least 6 months about a number of events or activities. (Status: maintained) -- No Description Entered  Medication Status compliance  Safety none  If Suicidal or Homicidal State Action Taken: unspecified  Current Risk: low Medications unspecified Objectives Related Problem: Resolve the core conflict that is the source of anxiety. Description: Describe situations, thoughts, feelings, and actions associated with anxieties and worries, their impact on functioning, and attempts to resolve them. Target Date: 2022-02-12 Frequency: Daily Modality: individual Progress: 80%  Related Problem: Resolve the core conflict that is the source of anxiety. Description: Learn and implement calming skills to reduce overall anxiety and manage anxiety symptoms. Target Date: 2022-02-12 Frequency: Daily Modality: individual Progress: 70%  Related Problem: Resolve the core conflict that is the source of anxiety. Description: Learn and implement problem-solving strategies for realistically addressing worries. Target Date: 2022-02-12 Frequency: Daily Modality: individual Progress: 70%  Related Problem: Resolve the core conflict that is the source of anxiety. Description: Maintain involvement in work, family, and social activities. Target Date: 2022-02-12 Frequency: Daily Modality: individual Progress: 70%  Related Problem: Restore restful sleep pattern. Description: Describe the history and details of sleep pattern. Target Date: 2022-03-15 Frequency: Daily Modality: individual Progress: 70%    Client Response full compliance  Service Location Location, [address] [place],  [place] [address]  Service Code cpt [phone] P Self-monitoring  Lifestyle change (exercise, nutrition)  Self care activities  Distress tolerance skill  Identify/label emotions  Identify automatic thoughts  Facilitate problem solving  Normalize/Reframe  Behavioral activation plan  Validate/empathize  Comments  Dx.: Generalized Anxiety (F41.1)  Meds: No psychotropics Goals: [name] is seeking to develop a more satisfying life-style that takes into account her current physical condition. Goal date 11-04-20. States that the current situation in the world (politics and Covid 19) is making her very anxious. She is "obsessed" with the news (social media) and remains in a constant state of stress. She wants to learn to be less reactive. Will initiate behaviorally oriented individual therapy to address symptoms. Goal date is 10-04-21. While patient has realized some improvement in level of anxiety, she still expresses considerable stress and anxiety. Continues to need better balance in life. Revised date is 12-23.  Patient agrees to a Engineer, structural session. She is at home and I am at my home office.   [name] says that she is feeling better because she "worked things out" with her sister. Says that her sister got confused when speaking with the social worker and that is why she had said the things about their children. [name] has been writing a lot of poetry because of the poethry group. This has been very gratifying.  She talked about the upcoming holidays and that she will live stream service out of concern about the virus. She has a number of health concerns and has some mortality fears. Her poetry writing is what helps her relax. She has already sent some of her poetry in for a  Joy Morel, PhD  Time: 3:15p-4:00p 45 minutes                   Joy Morel, PhD

## 2021-11-29 ENCOUNTER — Ambulatory Visit: Payer: Medicare PPO | Admitting: Psychology

## 2021-12-06 ENCOUNTER — Ambulatory Visit (INDEPENDENT_AMBULATORY_CARE_PROVIDER_SITE_OTHER): Payer: Medicare PPO | Admitting: Psychology

## 2021-12-06 DIAGNOSIS — F411 Generalized anxiety disorder: Secondary | ICD-10-CM
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
## 2021-12-13 ENCOUNTER — Ambulatory Visit (INDEPENDENT_AMBULATORY_CARE_PROVIDER_SITE_OTHER): Payer: Medicare PPO | Admitting: Psychology

## 2021-12-13 DIAGNOSIS — F411 Generalized anxiety disorder: Secondary | ICD-10-CM | POA: Diagnosis not present

## 2021-12-13 NOTE — Progress Notes (Signed)
Date: 12/13/2021  Diagnosis G47.00 (Insomnia disorder) [n/a]  300.02 (Generalized anxiety disorder) [n/a]  Symptoms Complains of difficulty remaining asleep. (Status: maintained) -- No Description Entered  Excessive and/or unrealistic worry that is difficult to control occurring more days than not for at least 6 months about a number of events or activities. (Status: maintained) -- No Description Entered  Medication Status compliance  Safety none  If Suicidal or Homicidal State Action Taken: unspecified  Current Risk: low Medications unspecified Objectives Related Problem: Resolve the core conflict that is the source of anxiety. Description: Describe situations, thoughts, feelings, and actions associated with anxieties and worries, their impact on functioning, and attempts to resolve them. Target Date: 2022-02-12 Frequency: Daily Modality: individual Progress: 80%  Related Problem: Resolve the core conflict that is the source of anxiety. Description: Learn and implement calming skills to reduce overall anxiety and manage anxiety symptoms. Target Date: 2022-02-12 Frequency: Daily Modality: individual Progress: 70%  Related Problem: Resolve the core conflict that is the source of anxiety. Description: Learn and implement problem-solving strategies for realistically addressing worries. Target Date: 2022-02-12 Frequency: Daily Modality: individual Progress: 70%  Related Problem: Resolve the core conflict that is the source of anxiety. Description: Maintain involvement in work, family, and social activities. Target Date: 2022-02-12 Frequency: Daily Modality: individual Progress: 70%  Related Problem: Restore restful sleep pattern. Description: Describe the history and details of sleep pattern. Target Date: 2022-03-15 Frequency: Daily Modality: individual Progress: 70%    Client Response full compliance  Service Location Location, [address]  [place], [place] [address]  Service Code cpt [phone] P Self-monitoring  Lifestyle change (exercise, nutrition)  Self care activities  Distress tolerance skill  Identify/label emotions  Identify automatic thoughts  Facilitate problem solving  Normalize/Reframe  Behavioral activation plan  Validate/empathize  Comments  Dx.: Generalized Anxiety (F41.1)  Meds: No psychotropics Goals: Glessie is seeking to develop a more satisfying life-style that takes into account her current physical condition. Goal date 11-04-20. States that the current situation in the world (politics and Covid 19) is making her very anxious. She is "obsessed" with the news (social media) and remains in a constant state of stress. She wants to learn to be less reactive. Will initiate behaviorally oriented individual therapy to address symptoms. Goal date is 10-04-21. While patient has realized some improvement in level of anxiety, she still expresses considerable stress and anxiety. Continues to need better balance in life. Revised date is 12-23.  Patient agrees to a Engineer, structural session. She is at home and I am at my home office.   Leonda says that her sleep has been disruptive lately. She wakes up and cannot get back to sleep. The world events have been disturbing for her. She has had a few nights that are better recently. Unable to identify what helps or hinders her sleep. Will continue to monitor to determine potentially helpful factors. She is spending a lot of time on her poetry and interacting with people in her on-line poetry group. She wants to focus on greater self care (exercise, diet).  [name], PhD  Time: 3:15p-4:00p 45 minutes

## 2021-12-20 ENCOUNTER — Ambulatory Visit (INDEPENDENT_AMBULATORY_CARE_PROVIDER_SITE_OTHER): Payer: Medicare PPO | Admitting: Psychology

## 2021-12-20 DIAGNOSIS — F411 Generalized anxiety disorder: Secondary | ICD-10-CM
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
## 2021-12-27 ENCOUNTER — Ambulatory Visit (INDEPENDENT_AMBULATORY_CARE_PROVIDER_SITE_OTHER): Payer: Medicare PPO | Admitting: Psychology

## 2021-12-27 DIAGNOSIS — F411 Generalized anxiety disorder: Secondary | ICD-10-CM
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
## 2022-01-03 ENCOUNTER — Ambulatory Visit (INDEPENDENT_AMBULATORY_CARE_PROVIDER_SITE_OTHER): Payer: Medicare PPO | Admitting: Psychology

## 2022-01-03 DIAGNOSIS — F411 Generalized anxiety disorder: Secondary | ICD-10-CM

## 2022-01-03 NOTE — Progress Notes (Signed)
Date: 01/03/2022  Diagnosis G47.00 (Insomnia disorder) [n/a]  300.02 (Generalized anxiety disorder) [n/a]  Symptoms Complains of difficulty remaining asleep. (Status: maintained) -- No Description Entered  Excessive and/or unrealistic worry that is difficult to control occurring more days than not for at least 6 months about a number of events or activities. (Status: maintained) -- No Description Entered  Medication Status compliance  Safety none  If Suicidal or Homicidal State Action Taken: unspecified  Current Risk: low Medications unspecified Objectives Related Problem: Resolve the core conflict that is the source of anxiety. Description: Describe situations, thoughts, feelings, and actions associated with anxieties and worries, their impact on functioning, and attempts to resolve them. Target Date: 2022-02-12 Frequency: Daily Modality: individual Progress: 80%  Related Problem: Resolve the core conflict that is the source of anxiety. Description: Learn and implement calming skills to reduce overall anxiety and manage anxiety symptoms. Target Date: 2022-02-12 Frequency: Daily Modality: individual Progress: 70%  Related Problem: Resolve the core conflict that is the source of anxiety. Description: Learn and implement problem-solving strategies for realistically addressing worries. Target Date: 2022-02-12 Frequency: Daily Modality: individual Progress: 70%  Related Problem: Resolve the core conflict that is the source of anxiety. Description: Maintain involvement in work, family, and social activities. Target Date: 2022-02-12 Frequency: Daily Modality: individual Progress: 70%  Related Problem: Restore restful sleep pattern. Description: Describe the history and details of sleep pattern. Target Date: 2022-03-15 Frequency: Daily Modality: individual Progress: 70%    Client  Response full compliance  Service Location Location, [address] [place], [place] [address]  Service Code cpt [phone] P Self-monitoring  Lifestyle change (exercise, nutrition)  Self care activities  Distress tolerance skill  Identify/label emotions  Identify automatic thoughts  Facilitate problem solving  Normalize/Reframe  Behavioral activation plan  Validate/empathize  Comments  Dx.: Generalized Anxiety (F41.1)  Meds: No psychotropics Goals: [name] is seeking to develop a more satisfying life-style that takes into account her current physical condition. Goal date 11-04-20. States that the current situation in the world (politics and Covid 19) is making her very anxious. She is "obsessed" with the news (social media) and remains in a constant state of stress. She wants to learn to be less reactive. Will initiate behaviorally oriented individual therapy to address symptoms. Goal date is 10-04-21. While patient has realized some improvement in level of anxiety, she still expresses considerable stress and anxiety. Continues to need better balance in life. Revised date is 12-23.  Patient agrees to a Engineer, structural session. She is at home and I am at my home office.   [name] says she is frustrated that her TV died. She has ordered another and is watching news, etc. Online. Expresses great concern about political situation but feels powerless to do anything about it. She is continuing to stay in touch with her poetry group. She is concerned with the changing weather, she will not be getting out much and it "feels like prison" to be stuck at home. 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 This is very good for [facility] because she needs the distraction.  Joy Morel, PhD  Time: 3:15p-4:00p 45 minutes

## 2022-01-10 ENCOUNTER — Ambulatory Visit (INDEPENDENT_AMBULATORY_CARE_PROVIDER_SITE_OTHER): Payer: Medicare PPO | Admitting: Psychology

## 2022-01-10 DIAGNOSIS — F411 Generalized anxiety disorder: Secondary | ICD-10-CM

## 2022-01-10 NOTE — Progress Notes (Signed)
Date: 01/10/2022  Diagnosis G47.00 (Insomnia disorder) [n/a]  300.02 (Generalized anxiety disorder) [n/a]  Symptoms Complains of difficulty remaining asleep. (Status: maintained) -- No Description Entered  Excessive and/or unrealistic worry that is difficult to control occurring more days than not for at least 6 months about a number of events or activities. (Status: maintained) -- No Description Entered  Medication Status compliance  Safety none  If Suicidal or Homicidal State Action Taken: unspecified  Current Risk: low Medications unspecified Objectives Related Problem: Resolve the core conflict that is the source of anxiety. Description: Describe situations, thoughts, feelings, and actions associated with anxieties and worries, their impact on functioning, and attempts to resolve them. Target Date: 2022-02-12 Frequency: Daily Modality: individual Progress: 80%  Related Problem: Resolve the core conflict that is the source of anxiety. Description: Learn and implement calming skills to reduce overall anxiety and manage anxiety symptoms. Target Date: 2022-02-12 Frequency: Daily Modality: individual Progress: 70%  Related Problem: Resolve the core conflict that is the source of anxiety. Description: Learn and implement problem-solving strategies for realistically addressing worries. Target Date: 2022-02-12 Frequency: Daily Modality: individual Progress: 70%  Related Problem: Resolve the core conflict that is the source of anxiety. Description: Maintain involvement in work, family, and social activities. Target Date: 2022-02-12 Frequency: Daily Modality: individual Progress: 70%  Related Problem: Restore restful sleep pattern. Description: Describe the history and details of sleep pattern. Target Date: 2022-03-15 Frequency: Daily Modality: individual Progress: 70%    Client Response full compliance  Service Location Location, [address] [place],  [place] [address]  Service Code cpt [phone] P Self-monitoring  Lifestyle change (exercise, nutrition)  Self care activities  Distress tolerance skill  Identify/label emotions  Identify automatic thoughts  Facilitate problem solving  Normalize/Reframe  Behavioral activation plan  Validate/empathize  Comments  Dx.: Generalized Anxiety (F41.1)  Meds: No psychotropics Goals: Chaslyn is seeking to develop a more satisfying life-style that takes into account her current physical condition. Goal date 11-04-20. States that the current situation in the world (politics and Covid 19) is making her very anxious. She is "obsessed" with the news (social media) and remains in a constant state of stress. She wants to learn to be less reactive. Will initiate behaviorally oriented individual therapy to address symptoms. Goal date is 10-04-21. While patient has realized some improvement in level of anxiety, she still expresses considerable stress and anxiety. Continues to need better balance in life. Revised date is 12-23.  Patient agrees to a Engineer, structural session. She is at home and I am at my home office.   Amaziah says she is having a bad day. She says she tried doing her volunteer work but had computer difficulties. She is conflicted and distraught regarding the conflicts in the middle [place]. She continues to express some frustration with her sister, regarding being "over-cautious" about Covid 19. She is worried, however, that her sister will have an accident if she leaves her house. Working on her poems continues to be relaxing for her and provides a fulfilling activity (distraction).  [name], PhD  Time: 3:15p-4:00p 45 minutes

## 2022-01-17 ENCOUNTER — Ambulatory Visit (INDEPENDENT_AMBULATORY_CARE_PROVIDER_SITE_OTHER): Payer: Medicare PPO | Admitting: Psychology

## 2022-01-17 DIAGNOSIS — F411 Generalized anxiety disorder: Secondary | ICD-10-CM

## 2022-01-17 NOTE — Progress Notes (Signed)
Date: 01/17/2022  Diagnosis G47.00 (Insomnia disorder) [n/a]  300.02 (Generalized anxiety disorder) [n/a]  Symptoms Complains of difficulty remaining asleep. (Status: maintained) -- No Description Entered  Excessive and/or unrealistic worry that is difficult to control occurring more days than not for at least 6 months about a number of events or activities. (Status: maintained) -- No Description Entered  Medication Status compliance  Safety none  If Suicidal or Homicidal State Action Taken: unspecified  Current Risk: low Medications unspecified Objectives Related Problem: Resolve the core conflict that is the source of anxiety. Description: Describe situations, thoughts, feelings, and actions associated with anxieties and worries, their impact on functioning, and attempts to resolve them. Target Date: 2022-02-12 Frequency: Daily Modality: individual Progress: 80%  Related Problem: Resolve the core conflict that is the source of anxiety. Description: Learn and implement calming skills to reduce overall anxiety and manage anxiety symptoms. Target Date: 2022-02-12 Frequency: Daily Modality: individual Progress: 70%  Related Problem: Resolve the core conflict that is the source of anxiety. Description: Learn and implement problem-solving strategies for realistically addressing worries. Target Date: 2022-02-12 Frequency: Daily Modality: individual Progress: 70%  Related Problem: Resolve the core conflict that is the source of anxiety. Description: Maintain involvement in work, family, and social activities. Target Date: 2022-02-12 Frequency: Daily Modality: individual Progress: 70%  Related Problem: Restore restful sleep pattern. Description: Describe the history and details of sleep pattern. Target Date: 2022-03-15 Frequency: Daily Modality: individual Progress: 70%    Client Response full compliance  Service Location Location, [address] [place], [place] [address]  Service Code cpt [phone] P Self-monitoring  Lifestyle change (exercise, nutrition)  Self care activities  Distress tolerance skill  Identify/label emotions  Identify automatic thoughts  Facilitate problem solving  Normalize/Reframe  Behavioral activation plan  Validate/empathize  Comments  Dx.: Generalized Anxiety (F41.1)  Meds: No psychotropics Goals: Armonii is seeking to develop a more satisfying life-style that takes into account her current physical condition. Goal date 11-04-20. States that the current situation in the world (politics and Covid 19) is making her very anxious. She is "obsessed" with the news (social media) and remains in a constant state of stress. She wants to learn to be less reactive. Will initiate behaviorally oriented individual therapy to address symptoms. Goal date is 10-04-21. While patient has realized some improvement in level of anxiety, she still expresses considerable stress and anxiety. Continues to need better balance in life. Revised date is 12-23.  Patient agrees to a Engineer, structural session. She is at home and I am at my home office.   Cary talked about being upset about the situation in the [place]. She and her sister are trying to stay involved in some (limited) activities. Going to a Librarian, academic by [place]. She shared a part of her life when she was searching for "community". She attended a [employer] for a while, but eventually [place]. She is trying to remain optimistic in the face of a lot of "negativity" in the country. These are the things that keep her agitated and out of sorts. She is not as distressed as she has been in the past over the political climate.   `  [name], PhD  Time: 3:15p-4:00p 45 minutes

## 2022-01-24 ENCOUNTER — Ambulatory Visit (INDEPENDENT_AMBULATORY_CARE_PROVIDER_SITE_OTHER): Payer: Medicare PPO | Admitting: Psychology

## 2022-01-24 DIAGNOSIS — F411 Generalized anxiety disorder: Secondary | ICD-10-CM

## 2022-01-24 NOTE — Progress Notes (Signed)
Date: 01/24/2022  Diagnosis G47.00 (Insomnia disorder) [n/a]  300.02 (Generalized anxiety disorder) [n/a]  Symptoms Complains of difficulty remaining asleep. (Status: maintained) -- No Description Entered  Excessive and/or unrealistic worry that is difficult to control occurring more days than not for at least 6 months about a number of events or activities. (Status: maintained) -- No Description Entered  Medication Status compliance  Safety none  If Suicidal or Homicidal State Action Taken: unspecified  Current Risk: low Medications unspecified Objectives Related Problem: Resolve the core conflict that is the source of anxiety. Description: Describe situations, thoughts, feelings, and actions associated with anxieties and worries, their impact on functioning, and attempts to resolve them. Target Date: 2022-02-12 Frequency: Daily Modality: individual Progress: 80%  Related Problem: Resolve the core conflict that is the source of anxiety. Description: Learn and implement calming skills to reduce overall anxiety and manage anxiety symptoms. Target Date: 2022-02-12 Frequency: Daily Modality: individual Progress: 70%  Related Problem: Resolve the core conflict that is the source of anxiety. Description: Learn and implement problem-solving strategies for realistically addressing worries. Target Date: 2022-02-12 Frequency: Daily Modality: individual Progress: 70%  Related Problem: Resolve the core conflict that is the source of anxiety. Description: Maintain involvement in work, family, and social activities. Target Date: 2022-02-12 Frequency: Daily Modality: individual Progress: 70%  Related Problem: Restore restful sleep pattern. Description: Describe the history and details of sleep pattern. Target Date: 2022-03-15 Frequency: Daily Modality: individual Progress: 70%    Client Response full compliance  Service Location Location, [address]  [place], [place] [address]  Service Code cpt [phone] P Self-monitoring  Lifestyle change (exercise, nutrition)  Self care activities  Distress tolerance skill  Identify/label emotions  Identify automatic thoughts  Facilitate problem solving  Normalize/Reframe  Behavioral activation plan  Validate/empathize  Comments  Dx.: Generalized Anxiety (F41.1)  Meds: No psychotropics Goals: [name] is seeking to develop a more satisfying life-style that takes into account her current physical condition. Goal date 11-04-20. States that the current situation in the world (politics and Covid 19) is making her very anxious. She is "obsessed" with the news (social media) and remains in a constant state of stress. She wants to learn to be less reactive. Will initiate behaviorally oriented individual therapy to address symptoms. Goal date is 10-04-21. While patient has realized some improvement in level of anxiety, she still expresses considerable stress and anxiety. Continues to need better balance in life. Revised date is 12-23.  Patient agrees to a Engineer, structural session. She is at home and I am at my home office.   [name] says she is taking a "news free" day. She needs the break from the news, which she tends to over indulge. 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 It is also a good distraction for her from over exposure to news media. She talked about the many adventures out of the home that she and her sister are doing. Says her sister has been a little less anxious lately, which is a significant improvement.     `  [name], PhD  Time: 3:15p-4:00p 45 minutes

## 2022-01-30 DIAGNOSIS — R42 Dizziness and giddiness: Secondary | ICD-10-CM | POA: Diagnosis not present

## 2022-01-30 DIAGNOSIS — H903 Sensorineural hearing loss, bilateral: Secondary | ICD-10-CM | POA: Diagnosis not present

## 2022-01-30 DIAGNOSIS — H6123 Impacted cerumen, bilateral: Secondary | ICD-10-CM | POA: Diagnosis not present

## 2022-01-31 ENCOUNTER — Ambulatory Visit (INDEPENDENT_AMBULATORY_CARE_PROVIDER_SITE_OTHER): Payer: Medicare PPO | Admitting: Psychology

## 2022-01-31 DIAGNOSIS — F411 Generalized anxiety disorder: Secondary | ICD-10-CM | POA: Diagnosis not present

## 2022-01-31 NOTE — Progress Notes (Signed)
Date: 01/31/2022  Diagnosis G47.00 (Insomnia disorder) [n/a]  300.02 (Generalized anxiety disorder) [n/a]  Symptoms Complains of difficulty remaining asleep. (Status: maintained) -- No Description Entered  Excessive and/or unrealistic worry that is difficult to control occurring more days than not for at least 6 months about a number of events or activities. (Status: maintained) -- No Description Entered  Medication Status compliance  Safety none  If Suicidal or Homicidal State Action Taken: unspecified  Current Risk: low Medications unspecified Objectives Related Problem: Resolve the core conflict that is the source of anxiety. Description: Describe situations, thoughts, feelings, and actions associated with anxieties and worries, their impact on functioning, and attempts to resolve them. Target Date: 2022-02-12 Frequency: Daily Modality: individual Progress: 80%  Related Problem: Resolve the core conflict that is the source of anxiety. Description: Learn and implement calming skills to reduce overall anxiety and manage anxiety symptoms. Target Date: 2022-02-12 Frequency: Daily Modality: individual Progress: 70%  Related Problem: Resolve the core conflict that is the source of anxiety. Description: Learn and implement problem-solving strategies for realistically addressing worries. Target Date: 2022-02-12 Frequency: Daily Modality: individual Progress: 70%  Related Problem: Resolve the core conflict that is the source of anxiety. Description: Maintain involvement in work, family, and social activities. Target Date: 2022-02-12 Frequency: Daily Modality: individual Progress: 70%  Related Problem: Restore restful sleep pattern. Description: Describe the history and details of sleep pattern. Target Date: 2022-03-15 Frequency: Daily Modality: individual Progress: 70%    Client Response full compliance  Service Location Location, [address] [place],  [place] [address]  Service Code cpt [phone] P Self-monitoring  Lifestyle change (exercise, nutrition)  Self care activities  Distress tolerance skill  Identify/label emotions  Identify automatic thoughts  Facilitate problem solving  Normalize/Reframe  Behavioral activation plan  Validate/empathize  Comments  Dx.: Generalized Anxiety (F41.1)  Meds: No psychotropics Goals: [name] is seeking to develop a more satisfying life-style that takes into account her current physical condition. Goal date 11-04-20. States that the current situation in the world (politics and Covid 19) is making her very anxious. She is "obsessed" with the news (social media) and remains in a constant state of stress. She wants to learn to be less reactive. Will initiate behaviorally oriented individual therapy to address symptoms. Goal date is 10-04-21. While patient has realized some improvement in level of anxiety, she still expresses considerable stress and anxiety. Continues to need better balance in life. Revised date is 12-23.  Patient agrees to a Engineer, structural session. She is at home and I am at my home office.   [name] got her new TV today and she is learning the new system. She has some stress about her unfamiliarity with the product, but we discussed exercising patience. She talked about her depleted energy these days and that has been frustrating. The period of time that she has been without TV has been helpful to reduce her anxiety. Discussed that she is best served by reducing her exposure to media.         `  Joy Morel, PhD  Time: 3:15p-4:00p 45 minutes

## 2022-02-07 ENCOUNTER — Ambulatory Visit (INDEPENDENT_AMBULATORY_CARE_PROVIDER_SITE_OTHER): Payer: Medicare PPO | Admitting: Psychology

## 2022-02-07 DIAGNOSIS — F411 Generalized anxiety disorder: Secondary | ICD-10-CM | POA: Diagnosis not present

## 2022-02-07 NOTE — Progress Notes (Signed)
Date: 02/07/2022  Diagnosis G47.00 (Insomnia disorder) [n/a]  300.02 (Generalized anxiety disorder) [n/a]  Symptoms Complains of difficulty remaining asleep. (Status: maintained) -- No Description Entered  Excessive and/or unrealistic worry that is difficult to control occurring more days than not for at least 6 months about a number of events or activities. (Status: maintained) -- No Description Entered  Medication Status compliance  Safety none  If Suicidal or Homicidal State Action Taken: unspecified  Current Risk: low Medications unspecified Objectives Related Problem: Resolve the core conflict that is the source of anxiety. Description: Describe situations, thoughts, feelings, and actions associated with anxieties and worries, their impact on functioning, and attempts to resolve them. Target Date: 2022-08-14 Frequency: Daily Modality: individual Progress: 80%  Related Problem: Resolve the core conflict that is the source of anxiety. Description: Learn and implement calming skills to reduce overall anxiety and manage anxiety symptoms. Target Date: 2022-08-14 Frequency: Daily Modality: individual Progress: 75%  Related Problem: Resolve the core conflict that is the source of anxiety. Description: Learn and implement problem-solving strategies for realistically addressing worries. Target Date: 2022-08-14 Frequency: Daily Modality: individual Progress: 75%  Related Problem: Resolve the core conflict that is the source of anxiety. Description: Maintain involvement in work, family, and social activities. Target Date: 2022-02-12 Frequency: Daily Modality: individual Progress: 70%  Related Problem: Restore restful sleep pattern. Description: Describe the history and details of sleep pattern. Target Date: 2022-08-14 Frequency: Daily Modality: individual Progress: 80%    Client Response full compliance  Service Location Location, [address] [place], [place]  [address]  Service Code cpt [phone] P Self-monitoring  Lifestyle change (exercise, nutrition)  Self care activities  Distress tolerance skill  Identify/label emotions  Identify automatic thoughts  Facilitate problem solving  Normalize/Reframe  Behavioral activation plan  Validate/empathize  Comments  Dx.: Generalized Anxiety (F41.1)  Meds: No psychotropics Goals: Mauriana is seeking to develop a more satisfying life-style that takes into account her current physical condition. Goal date 11-04-20. States that the current situation in the world (politics and Covid 19) is making her very anxious. She is "obsessed" with the news (social media) and remains in a constant state of stress. She wants to learn to be less reactive. Will initiate behaviorally oriented individual therapy to address symptoms. Goal date is 10-04-21. While patient has realized some improvement in level of anxiety, she still expresses considerable stress and anxiety. Continues to need better balance in life. Revised date is 6-24.  Patient agrees to a Engineer, structural session. She is at home and I am at my home office.   Kielyn says she has been trying to avoid news about the war and antisemitism. This get her very distressed and fearful. She says she and her sister have attended some things at the [place], but also stream some other events. She says that she and her sister are getting along better because she says her sister is complaining less than before. Tries to get her sister to agree to get out more. Laniece hopes to get out more.             `  [name], PhD  Time: 3:15p-4:00p 45 minutes

## 2022-02-14 ENCOUNTER — Ambulatory Visit: Payer: Medicare PPO | Admitting: Psychology

## 2022-02-21 ENCOUNTER — Ambulatory Visit (INDEPENDENT_AMBULATORY_CARE_PROVIDER_SITE_OTHER): Payer: Medicare PPO | Admitting: Psychology

## 2022-02-21 DIAGNOSIS — F411 Generalized anxiety disorder: Secondary | ICD-10-CM

## 2022-02-21 NOTE — Progress Notes (Signed)
Date: 02/21/2022  Diagnosis G47.00 (Insomnia disorder) [n/a]  300.02 (Generalized anxiety disorder) [n/a]  Symptoms Complains of difficulty remaining asleep. (Status: maintained) -- No Description Entered  Excessive and/or unrealistic worry that is difficult to control occurring more days than not for at least 6 months about a number of events or activities. (Status: maintained) -- No Description Entered  Medication Status compliance  Safety none  If Suicidal or Homicidal State Action Taken: unspecified  Current Risk: low Medications unspecified Objectives Related Problem: Resolve the core conflict that is the source of anxiety. Description: Describe situations, thoughts, feelings, and actions associated with anxieties and worries, their impact on functioning, and attempts to resolve them. Target Date: 2022-08-14 Frequency: Daily Modality: individual Progress: 80%  Related Problem: Resolve the core conflict that is the source of anxiety. Description: Learn and implement calming skills to reduce overall anxiety and manage anxiety symptoms. Target Date: 2022-08-14 Frequency: Daily Modality: individual Progress: 75%  Related Problem: Resolve the core conflict that is the source of anxiety. Description: Learn and implement problem-solving strategies for realistically addressing worries. Target Date: 2022-08-14 Frequency: Daily Modality: individual Progress: 75%  Related Problem: Resolve the core conflict that is the source of anxiety. Description: Maintain involvement in work, family, and social activities. Target Date: 2022-02-12 Frequency: Daily Modality: individual Progress: 70%  Related Problem: Restore restful sleep pattern. Description: Describe the history and details of sleep pattern. Target Date: 2022-08-14 Frequency: Daily Modality: individual Progress: 80%    Client Response full compliance  Service Location Location, [address] [place], [place]  [address]  Service Code cpt [id number] P Self-monitoring  Lifestyle change (exercise, nutrition)  Self care activities  Distress tolerance skill  Identify/label emotions  Identify automatic thoughts  Facilitate problem solving  Normalize/Reframe  Behavioral activation plan  Validate/empathize  Comments  Dx.: Generalized Anxiety (F41.1)  Meds: No psychotropics Goals: Miela is seeking to develop a more satisfying life-style that takes into account her current physical condition. Goal date 11-04-20. States that the current situation in the world (politics and Covid 19) is making her very anxious. She is "obsessed" with the news (social media) and remains in a constant state of stress. She wants to learn to be less reactive. Will initiate behaviorally oriented individual therapy to address symptoms. Goal date is 10-04-21. While patient has realized some improvement in level of anxiety, she still expresses considerable stress and anxiety. Continues to need better balance in life. Revised date is 6-24.  Patient agrees to a Engineer, structural session. She is at home and I am at my home office.   Dameshia talked about being brought up kosher, but her family was not religious. She likes the affiliation, but is not interested in being observant. Says that she is trying to get out more and is going to restaurants with a meet up group through [facility]. She is staying busy with cooking and watching her new television. She wants to cut back the TV time, which she admits adds to her anxiety.                 `  [name], PhD  Time: 3:15p-4:00p 45 minutes

## 2022-02-28 ENCOUNTER — Ambulatory Visit: Payer: Medicare PPO | Admitting: Psychology

## 2022-02-28 ENCOUNTER — Ambulatory Visit (INDEPENDENT_AMBULATORY_CARE_PROVIDER_SITE_OTHER): Payer: Medicare PPO | Admitting: Psychology

## 2022-02-28 DIAGNOSIS — F411 Generalized anxiety disorder: Secondary | ICD-10-CM | POA: Diagnosis not present

## 2022-02-28 NOTE — Progress Notes (Addendum)
Date: 02/28/2022  Diagnosis G47.00 (Insomnia disorder) [n/a]  300.02 (Generalized anxiety disorder) [n/a]  Symptoms Complains of difficulty remaining asleep. (Status: maintained) -- No Description Entered  Excessive and/or unrealistic worry that is difficult to control occurring more days than not for at least 6 months about a number of events or activities. (Status: maintained) -- No Description Entered  Medication Status compliance  Safety none  If Suicidal or Homicidal State Action Taken: unspecified  Current Risk: low Medications unspecified Objectives Related Problem: Resolve the core conflict that is the source of anxiety. Description: Describe situations, thoughts, feelings, and actions associated with anxieties and worries, their impact on functioning, and attempts to resolve them. Target Date: 2022-08-14 Frequency: Daily Modality: individual Progress: 80%  Related Problem: Resolve the core conflict that is the source of anxiety. Description: Learn and implement calming skills to reduce overall anxiety and manage anxiety symptoms. Target Date: 2022-08-14 Frequency: Daily Modality: individual Progress: 75%  Related Problem: Resolve the core conflict that is the source of anxiety. Description: Learn and implement problem-solving strategies for realistically addressing worries. Target Date: 2022-08-14 Frequency: Daily Modality: individual Progress: 75%  Related Problem: Resolve the core conflict that is the source of anxiety. Description: Maintain involvement in work, family, and social activities. Target Date: 2023-02-13 Frequency: Daily Modality: individual Progress: 70%  Related Problem: Restore restful sleep pattern. Description: Describe the history and details of sleep pattern. Target Date: 2022-08-14 Frequency: Daily Modality: individual Progress: 80%    Client Response full compliance  Service Location Location, [address] [place], [place]  [address]  Service Code cpt [id number] P Self-monitoring  Lifestyle change (exercise, nutrition)  Self care activities  Distress tolerance skill  Identify/label emotions  Identify automatic thoughts  Facilitate problem solving  Normalize/Reframe  Behavioral activation plan  Validate/empathize  Comments  Dx.: Generalized Anxiety (F41.1)  Meds: No psychotropics Goals: [name] is seeking to develop a more satisfying life-style that takes into account her current physical condition. Goal date 11-04-20. States that the current situation in the world (politics and Covid 19) is making her very anxious. She is "obsessed" with the news (social media) and remains in a constant state of stress. She wants to learn to be less reactive. Will initiate behaviorally oriented individual therapy to address symptoms. Goal date is 10-04-21. While patient has realized some improvement in level of anxiety, she still expresses considerable stress and anxiety. Continues to need better balance in life. Revised date is 6-24.  Patient agrees to a Engineer, structural session. She is at home and I am at my home office.   [name] says that she has had a good week. Sleep has been inconsistent. She has been frustrated with her sister's attitude toward everything recently. She is feeling "worn out" physically and thinks it is related to her poor sleep. She talked about some of her struggles with her Cpap machine. We brainstormed some possible solutions.  She has taken a break from writing for the past couple of weeks. Suggested that it will be halpful for her mind/moods if she gets back to writing asap. She acknowledges that writing poems helps her emotional stability.                     `  Joy Morel, PhD  Time: 3:15p-4:00p 45 minutes

## 2022-03-07 ENCOUNTER — Ambulatory Visit (INDEPENDENT_AMBULATORY_CARE_PROVIDER_SITE_OTHER): Payer: Medicare PPO | Admitting: Psychology

## 2022-03-07 DIAGNOSIS — F411 Generalized anxiety disorder: Secondary | ICD-10-CM | POA: Diagnosis not present

## 2022-03-07 NOTE — Progress Notes (Addendum)
Date: 03/07/2022  Diagnosis G47.00 (Insomnia disorder) [n/a]  300.02 (Generalized anxiety disorder) [n/a]  Symptoms Complains of difficulty remaining asleep. (Status: maintained) -- No Description Entered  Excessive and/or unrealistic worry that is difficult to control occurring more days than not for at least 6 months about a number of events or activities. (Status: maintained) -- No Description Entered  Medication Status compliance  Safety none  If Suicidal or Homicidal State Action Taken: unspecified  Current Risk: low Medications unspecified Objectives Related Problem: Resolve the core conflict that is the source of anxiety. Description: Describe situations, thoughts, feelings, and actions associated with anxieties and worries, their impact on functioning, and attempts to resolve them. Target Date: 2022-08-14 Frequency: Daily Modality: individual Progress: 80%  Related Problem: Resolve the core conflict that is the source of anxiety. Description: Learn and implement calming skills to reduce overall anxiety and manage anxiety symptoms. Target Date: 2022-08-14 Frequency: Daily Modality: individual Progress: 75%  Related Problem: Resolve the core conflict that is the source of anxiety. Description: Learn and implement problem-solving strategies for realistically addressing worries. Target Date: 2022-08-14 Frequency: Daily Modality: individual Progress: 75%  Related Problem: Resolve the core conflict that is the source of anxiety. Description: Maintain involvement in work, family, and social activities. Target Date: 2023-02-13 Frequency: Daily Modality: individual Progress: 70%  Related Problem: Restore restful sleep pattern. Description: Describe the history and details of sleep pattern. Target Date: 2022-08-14 Frequency: Daily Modality: individual Progress: 80%    Client Response full compliance  Service Location Location, [address] [place], [place]  [address]  Service Code cpt [phone] P Self-monitoring  Lifestyle change (exercise, nutrition)  Self care activities  Distress tolerance skill  Identify/label emotions  Identify automatic thoughts  Facilitate problem solving  Normalize/Reframe  Behavioral activation plan  Validate/empathize  Comments  Dx.: Generalized Anxiety (F41.1)  Meds: No psychotropics Goals: [name] is seeking to develop a more satisfying life-style that takes into account her current physical condition. Goal date 11-04-20. States that the current situation in the world (politics and Covid 19) is making her very anxious. She is "obsessed" with the news (social media) and remains in a constant state of stress. She wants to learn to be less reactive. Will initiate behaviorally oriented individual therapy to address symptoms. Goal date is 10-04-21. While patient has realized some improvement in level of anxiety, she still expresses considerable stress and anxiety. Continues to need better balance in life. Revised date is 6-24.  Patient agrees to a Engineer, structural session. She is at home and I am at my home office.   [name] talked about having a quiet holiday. She and her sister have been watching a lot of television. She says she is trying to set up some activities outside of the house. [facility]. She claims that she watches TV in an effort to avoid unpleasant topics. Will try to return to her poetry this week.                        `  Joy Morel, PhD  Time: 3:15p-4:00p 45 minutes

## 2022-03-14 ENCOUNTER — Ambulatory Visit (INDEPENDENT_AMBULATORY_CARE_PROVIDER_SITE_OTHER): Payer: Medicare PPO | Admitting: Psychology

## 2022-03-14 DIAGNOSIS — F411 Generalized anxiety disorder: Secondary | ICD-10-CM

## 2022-03-14 NOTE — Progress Notes (Signed)
Date: 03/14/2022  Diagnosis G47.00 (Insomnia disorder) [n/a]  300.02 (Generalized anxiety disorder) [n/a]  Symptoms Complains of difficulty remaining asleep. (Status: maintained) -- No Description Entered  Excessive and/or unrealistic worry that is difficult to control occurring more days than not for at least 6 months about a number of events or activities. (Status: maintained) -- No Description Entered  Medication Status compliance  Safety none  If Suicidal or Homicidal State Action Taken: unspecified  Current Risk: low Medications unspecified Objectives Related Problem: Resolve the core conflict that is the source of anxiety. Description: Describe situations, thoughts, feelings, and actions associated with anxieties and worries, their impact on functioning, and attempts to resolve them. Target Date: 2022-08-14 Frequency: Daily Modality: individual Progress: 80%  Related Problem: Resolve the core conflict that is the source of anxiety. Description: Learn and implement calming skills to reduce overall anxiety and manage anxiety symptoms. Target Date: 2022-08-14 Frequency: Daily Modality: individual Progress: 75%  Related Problem: Resolve the core conflict that is the source of anxiety. Description: Learn and implement problem-solving strategies for realistically addressing worries. Target Date: 2022-08-14 Frequency: Daily Modality: individual Progress: 75%  Related Problem: Resolve the core conflict that is the source of anxiety. Description: Maintain involvement in work, family, and social activities. Target Date: 2022-02-12 Frequency: Daily Modality: individual Progress: 70%  Related Problem: Restore restful sleep pattern. Description: Describe the history and details of sleep pattern. Target Date: 2022-08-14 Frequency: Daily Modality: individual Progress: 80%    Client Response full compliance  Service  Location Location, [address] [place], [place] [address]  Service Code cpt [phone] P Self-monitoring  Lifestyle change (exercise, nutrition)  Self care activities  Distress tolerance skill  Identify/label emotions  Identify automatic thoughts  Facilitate problem solving  Normalize/Reframe  Behavioral activation plan  Validate/empathize  Comments  Dx.: Generalized Anxiety (F41.1)  Meds: No psychotropics Goals: [name] is seeking to develop a more satisfying life-style that takes into account her current physical condition. Goal date 11-04-20. States that the current situation in the world (politics and Covid 19) is making her very anxious. She is "obsessed" with the news (social media) and remains in a constant state of stress. She wants to learn to be less reactive. Will initiate behaviorally oriented individual therapy to address symptoms. Goal date is 10-04-21. While patient has realized some improvement in level of anxiety, she still expresses considerable stress and anxiety. Continues to need better balance in life. Revised date is 6-24.  Patient agrees to a Engineer, structural session. She is at home and I am at my home office.   [name] talked about her political frustrations. She is starting to recognize the uselessness of obsessing over matters she cannot control. She needs to focus more on those matters that she can control. She is currently concerned about her dying computer. She relies heavily on her computer and needs to purchase a newer version. [name] is 77 years old. She tends to take a long time to make a decision and we talked about addressing this issue in a more efficient manner.                          `  Joy Morel, PhD  Time: 3:10p-4:00p 45 minutes

## 2022-03-21 ENCOUNTER — Ambulatory Visit (INDEPENDENT_AMBULATORY_CARE_PROVIDER_SITE_OTHER): Payer: Medicare PPO | Admitting: Psychology

## 2022-03-21 DIAGNOSIS — F411 Generalized anxiety disorder: Secondary | ICD-10-CM | POA: Diagnosis not present

## 2022-03-21 NOTE — Progress Notes (Addendum)
Date: 03/21/2022  Diagnosis G47.00 (Insomnia disorder) [n/a]  300.02 (Generalized anxiety disorder) [n/a]  Symptoms Complains of difficulty remaining asleep. (Status: maintained) -- No Description Entered  Excessive and/or unrealistic worry that is difficult to control occurring more days than not for at least 6 months about a number of events or activities. (Status: maintained) -- No Description Entered  Medication Status compliance  Safety none  If Suicidal or Homicidal State Action Taken: unspecified  Current Risk: low Medications unspecified Objectives Related Problem: Resolve the core conflict that is the source of anxiety. Description: Describe situations, thoughts, feelings, and actions associated with anxieties and worries, their impact on functioning, and attempts to resolve them. Target Date: 2022-08-14 Frequency: Daily Modality: individual Progress: 80%  Related Problem: Resolve the core conflict that is the source of anxiety. Description: Learn and implement calming skills to reduce overall anxiety and manage anxiety symptoms. Target Date: 2022-08-14 Frequency: Daily Modality: individual Progress: 75%  Related Problem: Resolve the core conflict that is the source of anxiety. Description: Learn and implement problem-solving strategies for realistically addressing worries. Target Date: 2022-08-14 Frequency: Daily Modality: individual Progress: 75%  Related Problem: Resolve the core conflict that is the source of anxiety. Description: Maintain involvement in work, family, and social activities. Target Date: 2023-02-13 Frequency: Daily Modality: individual Progress: 70%  Related Problem: Restore restful sleep pattern. Description: Describe the history and details of sleep pattern. Target Date: 2022-08-14 Frequency: Daily Modality: individual Progress: 80%    Client  Response full compliance  Service Location Location, [address] [place], [place] [address]  Service Code cpt [phone] P Self-monitoring  Lifestyle change (exercise, nutrition)  Self care activities  Distress tolerance skill  Identify/label emotions  Identify automatic thoughts  Facilitate problem solving  Normalize/Reframe  Behavioral activation plan  Validate/empathize  Comments  Dx.: Generalized Anxiety (F41.1)  Meds: No psychotropics Goals: [name] is seeking to develop a more satisfying life-style that takes into account her current physical condition. Goal date 11-04-20. States that the current situation in the world (politics and Covid 19) is making her very anxious. She is "obsessed" with the news (social media) and remains in a constant state of stress. She wants to learn to be less reactive. Will initiate behaviorally oriented individual therapy to address symptoms. Goal date is 10-04-21. While patient has realized some improvement in level of anxiety, she still expresses considerable stress and anxiety. Continues to need better balance in life. Revised date is 6-24.  Patient agrees to a Engineer, structural session. She is at home and I am at my home office.   [name] says that she is going out with a social group over the weekend. This is frightening for her due to the virus, but she plans to attend anyway. Has decided to remain as cautious as possible. She also gets concerned about the potential for accidents when leaving the house. She manages these feelings for the most part and does get out of the house on a weekly basis. Moods are stable.                            `  [name], PhD  Time: 3:15p-4:00p 45 minutes

## 2022-03-28 ENCOUNTER — Ambulatory Visit (INDEPENDENT_AMBULATORY_CARE_PROVIDER_SITE_OTHER): Payer: Medicare PPO | Admitting: Psychology

## 2022-03-28 DIAGNOSIS — F411 Generalized anxiety disorder: Secondary | ICD-10-CM

## 2022-03-28 NOTE — Progress Notes (Signed)
Date: 03/28/2022  Diagnosis G47.00 (Insomnia disorder) [n/a]  300.02 (Generalized anxiety disorder) [n/a]  Symptoms Complains of difficulty remaining asleep. (Status: maintained) -- No Description Entered  Excessive and/or unrealistic worry that is difficult to control occurring more days than not for at least 6 months about a number of events or activities. (Status: maintained) -- No Description Entered  Medication Status compliance  Safety none  If Suicidal or Homicidal State Action Taken: unspecified  Current Risk: low Medications unspecified Objectives Related Problem: Resolve the core conflict that is the source of anxiety. Description: Describe situations, thoughts, feelings, and actions associated with anxieties and worries, their impact on functioning, and attempts to resolve them. Target Date: 2022-08-14 Frequency: Daily Modality: individual Progress: 80%  Related Problem: Resolve the core conflict that is the source of anxiety. Description: Learn and implement calming skills to reduce overall anxiety and manage anxiety symptoms. Target Date: 2022-08-14 Frequency: Daily Modality: individual Progress: 75%  Related Problem: Resolve the core conflict that is the source of anxiety. Description: Learn and implement problem-solving strategies for realistically addressing worries. Target Date: 2022-08-14 Frequency: Daily Modality: individual Progress: 75%  Related Problem: Resolve the core conflict that is the source of anxiety. Description: Maintain involvement in work, family, and social activities. Target Date: 2022-02-12 Frequency: Daily Modality: individual Progress: 70%  Related Problem: Restore restful sleep pattern. Description: Describe the history and details of sleep pattern. Target Date: 2022-08-14 Frequency: Daily Modality: individual Progress: 80%    Client Response full compliance  Service Location Location, [address] [place],   [address]  Service Code cpt [phone] P Self-monitoring  Lifestyle change (exercise, nutrition)  Self care activities  Distress tolerance skill  Identify/label emotions  Identify automatic thoughts  Facilitate problem solving  Normalize/Reframe  Behavioral activation plan  Validate/empathize  Comments  Dx.: Generalized Anxiety (F41.1)  Meds: No psychotropics Goals: [name] is seeking to develop a more satisfying life-style that takes into account her current physical condition. Goal date 11-04-20. States that the current situation in the world (politics and Covid 19) is making her very anxious. She is "obsessed" with the news (social media) and remains in a constant state of stress. She wants to learn to be less reactive. Will initiate behaviorally oriented individual therapy to address symptoms. Goal date is 10-04-21. While patient has realized some improvement in level of anxiety, she still expresses considerable stress and anxiety. Continues to need better balance in life. Revised date is 6-24.  Patient agrees to a Engineer, structural session. She is at home and I am at my home office.   [name] says she isn't sleeping well. Getting up and having a hard time getting back to sleep. She started a memory class today along with her sister. She is feeling it will be a good experience. She talked about volunteering for the [employer] as a way to channel her anxiety about the election. [name] says that she thinks she has a problem with "not throwing away paper. We talked about her irrational feeling that it will be "bad" if she throws away paper, "which she may need". Will work on "managing" all of those thing she "cannot" throw out.                                `  [name], PhD  Time: 3:15p-4:00p 45 minutes

## 2022-04-04 ENCOUNTER — Ambulatory Visit: Payer: Medicare PPO | Admitting: Psychology

## 2022-04-11 ENCOUNTER — Ambulatory Visit (INDEPENDENT_AMBULATORY_CARE_PROVIDER_SITE_OTHER): Payer: Medicare PPO | Admitting: Psychology

## 2022-04-11 DIAGNOSIS — F411 Generalized anxiety disorder: Secondary | ICD-10-CM

## 2022-04-11 NOTE — Progress Notes (Addendum)
Date: 04/11/2022  Diagnosis G47.00 (Insomnia disorder) [n/a]  300.02 (Generalized anxiety disorder) [n/a]  Symptoms Complains of difficulty remaining asleep. (Status: maintained) -- No Description Entered  Excessive and/or unrealistic worry that is difficult to control occurring more days than not for at least 6 months about a number of events or activities. (Status: maintained) -- No Description Entered  Medication Status compliance  Safety none  If Suicidal or Homicidal State Action Taken: unspecified  Current Risk: low Medications unspecified Objectives Related Problem: Resolve the core conflict that is the source of anxiety. Description: Describe situations, thoughts, feelings, and actions associated with anxieties and worries, their impact on functioning, and attempts to resolve them. Target Date: 2022-08-14 Frequency: Daily Modality: individual Progress: 80%  Related Problem: Resolve the core conflict that is the source of anxiety. Description: Learn and implement calming skills to reduce overall anxiety and manage anxiety symptoms. Target Date: 2022-08-14 Frequency: Daily Modality: individual Progress: 75%  Related Problem: Resolve the core conflict that is the source of anxiety. Description: Learn and implement problem-solving strategies for realistically addressing worries. Target Date: 2022-08-14 Frequency: Daily Modality: individual Progress: 75%  Related Problem: Resolve the core conflict that is the source of anxiety. Description: Maintain involvement in work, family, and social activities. Target Date: 2022-08-14 Frequency: Daily Modality: individual Progress: 70%  Related Problem: Restore restful sleep pattern. Description: Describe the history and details of sleep pattern. Target Date: 2022-08-14 Frequency: Daily Modality: individual Progress: 80%    Client Response full compliance  Service Location Location, [address] [place], [place] [address]  Service Code cpt [phone] P Self-monitoring  Lifestyle change (exercise, nutrition)  Self care activities  Distress tolerance skill  Identify/label emotions  Identify automatic thoughts  Facilitate problem solving  Normalize/Reframe  Behavioral activation plan  Validate/empathize  Comments  Dx.: Generalized Anxiety (F41.1)  Meds: No psychotropics Goals: [name] is seeking to develop a more satisfying life-style that takes into account her current physical condition. Goal date 11-04-20. States that the current situation in the world (politics and Covid 19) is making her very anxious. She is "obsessed" with the news (social media) and remains in a constant state of stress. She wants to learn to be less reactive. Will initiate behaviorally oriented individual therapy to address symptoms. Goal date is 10-04-21. While patient has realized some improvement in level of anxiety, she still expresses considerable stress and anxiety. Continues to need better balance in life. Revised date is 6-24.  Patient agrees to a Engineer, structural session. She is at home and I am at my home office.   [name] missed her memory class because she was not feeling well today. She is continuing to try to exercise (walking) 30 minutes/day. She is trying to stay busy by attending a number of "outside activities". She states she still feels she watches too much television, and wants to lessen her TV time.  She is going to see about a new cpap machine to see if it will improve her sleep. We discussed sleep hygiene to facilitate her sleep.                                    `  Joy Morel, PhD  Time: 2:45p-3:30p 45 minutes

## 2022-04-17 DIAGNOSIS — R200 Anesthesia of skin: Secondary | ICD-10-CM | POA: Diagnosis not present

## 2022-04-17 DIAGNOSIS — G5692 Unspecified mononeuropathy of left upper limb: Secondary | ICD-10-CM | POA: Diagnosis not present

## 2022-04-18 ENCOUNTER — Ambulatory Visit (INDEPENDENT_AMBULATORY_CARE_PROVIDER_SITE_OTHER): Payer: Medicare PPO | Admitting: Psychology

## 2022-04-18 DIAGNOSIS — F411 Generalized anxiety disorder: Secondary | ICD-10-CM | POA: Diagnosis not present

## 2022-04-18 NOTE — Progress Notes (Signed)
Date: 04/18/2022  Diagnosis G47.00 (Insomnia disorder) [n/a]  300.02 (Generalized anxiety disorder) [n/a]  Symptoms Complains of difficulty remaining asleep. (Status: maintained) -- No Description Entered  Excessive and/or unrealistic worry that is difficult to control occurring more days than not for at least 6 months about a number of events or activities. (Status: maintained) -- No Description Entered  Medication Status compliance  Safety none  If Suicidal or Homicidal State Action Taken: unspecified  Current Risk: low Medications unspecified Objectives Related Problem: Resolve the core conflict that is the source of anxiety. Description: Describe situations, thoughts, feelings, and actions associated with anxieties and worries, their impact on functioning, and attempts to resolve them. Target Date: 2022-08-14 Frequency: Daily Modality: individual Progress: 80%  Related Problem: Resolve the core conflict that is the source of anxiety. Description: Learn and implement calming skills to reduce overall anxiety and manage anxiety symptoms. Target Date: 2022-08-14 Frequency: Daily Modality: individual Progress: 75%  Related Problem: Resolve the core conflict that is the source of anxiety. Description: Learn and implement problem-solving strategies for realistically addressing worries. Target Date: 2022-08-14 Frequency: Daily Modality: individual Progress: 75%  Related Problem: Resolve the core conflict that is the source of anxiety. Description: Maintain involvement in work, family, and social activities. Target Date: 2022-02-12 Frequency: Daily Modality: individual Progress: 70%  Related Problem: Restore restful sleep pattern. Description: Describe the history and details of sleep pattern. Target Date: 2022-08-14 Frequency: Daily Modality: individual Progress: 80%    Client Response full compliance  Service Location Location, [address] [place],  [place] [address]  Service Code cpt [id number] P Self-monitoring  Lifestyle change (exercise, nutrition)  Self care activities  Distress tolerance skill  Identify/label emotions  Identify automatic thoughts  Facilitate problem solving  Normalize/Reframe  Behavioral activation plan  Validate/empathize  Comments  Dx.: Generalized Anxiety (F41.1)  Meds: No psychotropics Goals: [name] is seeking to develop a more satisfying life-style that takes into account her current physical condition. Goal date 11-04-20. States that the current situation in the world (politics and Covid 19) is making her very anxious. She is "obsessed" with the news (social media) and remains in a constant state of stress. She wants to learn to be less reactive. Will initiate behaviorally oriented individual therapy to address symptoms. Goal date is 10-04-21. While patient has realized some improvement in level of anxiety, she still expresses considerable stress and anxiety. Continues to need better balance in life. Revised date is 6-24.  Patient agrees to a Engineer, structural session. She is at home and I am at my home office.   [name] says she is "not too good" due to some medical symptoms. Feared she was having a stroke, but Dr. said "it might be neuropathy". He started her on Prednisone to see if it helps symptoms. No changes, but she just started the meds.  She feels she is "freaking out" with fear about her own mortality. She does report that she has a busy social calendar, with a number of events and meals with others planned. She is doing well with balancing activities, but seeks to minimize some of her chronic anxiety. Tried to intervene with some cognitive restructuring to reframe her negative perceptions of her environment.                                        `  Joy Morel, PhD  Time: 3:10p-4:00p 50  minutes

## 2022-04-25 ENCOUNTER — Ambulatory Visit (INDEPENDENT_AMBULATORY_CARE_PROVIDER_SITE_OTHER): Payer: Medicare PPO | Admitting: Psychology

## 2022-04-25 DIAGNOSIS — F411 Generalized anxiety disorder: Secondary | ICD-10-CM

## 2022-04-25 DIAGNOSIS — M7712 Lateral epicondylitis, left elbow: Secondary | ICD-10-CM | POA: Diagnosis not present

## 2022-04-25 DIAGNOSIS — R200 Anesthesia of skin: Secondary | ICD-10-CM | POA: Diagnosis not present

## 2022-04-25 DIAGNOSIS — I10 Essential (primary) hypertension: Secondary | ICD-10-CM | POA: Diagnosis not present

## 2022-04-25 DIAGNOSIS — M179 Osteoarthritis of knee, unspecified: Secondary | ICD-10-CM | POA: Diagnosis not present

## 2022-04-25 NOTE — Progress Notes (Addendum)
Date: 04/25/2022  Diagnosis G47.00 (Insomnia disorder) [n/a]  300.02 (Generalized anxiety disorder) [n/a]  Symptoms Complains of difficulty remaining asleep. (Status: maintained) -- No Description Entered  Excessive and/or unrealistic worry that is difficult to control occurring more days than not for at least 6 months about a number of events or activities. (Status: maintained) -- No Description Entered  Medication Status compliance  Safety none  If Suicidal or Homicidal State Action Taken: unspecified  Current Risk: low Medications unspecified Objectives Related Problem: Resolve the core conflict that is the source of anxiety. Description: Describe situations, thoughts, feelings, and actions associated with anxieties and worries, their impact on functioning, and attempts to resolve them. Target Date: 2022-08-14 Frequency: Daily Modality: individual Progress: 80%  Related Problem: Resolve the core conflict that is the source of anxiety. Description: Learn and implement calming skills to reduce overall anxiety and manage anxiety symptoms. Target Date: 2022-08-14 Frequency: Daily Modality: individual Progress: 75%  Related Problem: Resolve the core conflict that is the source of anxiety. Description: Learn and implement problem-solving strategies for realistically addressing worries. Target Date: 2022-08-14 Frequency: Daily Modality: individual Progress: 75%  Related Problem: Resolve the core conflict that is the source of anxiety. Description: Maintain involvement in work, family, and social activities. Target Date: 2022-08-14 Frequency: Daily Modality: individual Progress: 70%  Related Problem: Restore restful sleep pattern. Description: Describe the history and details of sleep pattern. Target Date: 2022-08-14 Frequency: Daily Modality: individual Progress: 80%    Client Response full compliance  Service Location Location, [address] [place], [place]  [address]  Service Code cpt [phone] P Self-monitoring  Lifestyle change (exercise, nutrition)  Self care activities  Distress tolerance skill  Identify/label emotions  Identify automatic thoughts  Facilitate problem solving  Normalize/Reframe  Behavioral activation plan  Validate/empathize  Comments  Dx.: Generalized Anxiety (F41.1)  Meds: No psychotropics Goals: Isobella is seeking to develop a more satisfying life-style that takes into account her current physical condition. Goal date 11-04-20. States that the current situation in the world (politics and Covid 19) is making her very anxious. She is "obsessed" with the news (social media) and remains in a constant state of stress. She wants to learn to be less reactive. Will initiate behaviorally oriented individual therapy to address symptoms. Goal date is 10-04-21. While patient has realized some improvement in level of anxiety, she still expresses considerable stress and anxiety. Continues to need better balance in life. Revised date is 6-24.  Patient agrees to a Engineer, structural session. She is at home and I am at my home office.   Quilla is pleased that the doctor told her that she does not have neuropathy. It is over-use of elbow/arm. We talked about "ways to relax" and she says she will do some "relaxation strategies".  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 This also serves to help mitigate her anxiety.                                           `  [name], PhD  Time: 3:10p-4:00p 50 minutes

## 2022-05-02 ENCOUNTER — Ambulatory Visit (INDEPENDENT_AMBULATORY_CARE_PROVIDER_SITE_OTHER): Payer: Medicare PPO | Admitting: Psychology

## 2022-05-02 DIAGNOSIS — F411 Generalized anxiety disorder: Secondary | ICD-10-CM | POA: Diagnosis not present

## 2022-05-02 NOTE — Progress Notes (Addendum)
Date: 05/02/2022  Diagnosis G47.00 (Insomnia disorder) [n/a]  300.02 (Generalized anxiety disorder) [n/a]  Symptoms Complains of difficulty remaining asleep. (Status: maintained) -- No Description Entered  Excessive and/or unrealistic worry that is difficult to control occurring more days than not for at least 6 months about a number of events or activities. (Status: maintained) -- No Description Entered  Medication Status compliance  Safety none  If Suicidal or Homicidal State Action Taken: unspecified  Current Risk: low Medications unspecified Objectives Related Problem: Resolve the core conflict that is the source of anxiety. Description: Describe situations, thoughts, feelings, and actions associated with anxieties and worries, their impact on functioning, and attempts to resolve them. Target Date: 2022-08-14 Frequency: Daily Modality: individual Progress: 80%  Related Problem: Resolve the core conflict that is the source of anxiety. Description: Learn and implement calming skills to reduce overall anxiety and manage anxiety symptoms. Target Date: 2022-08-14 Frequency: Daily Modality: individual Progress: 75%  Related Problem: Resolve the core conflict that is the source of anxiety. Description: Learn and implement problem-solving strategies for realistically addressing worries. Target Date: 2022-08-14 Frequency: Daily Modality: individual Progress: 75%  Related Problem: Resolve the core conflict that is the source of anxiety. Description: Maintain involvement in work, family, and social activities. Target Date: 2022-08-14 Frequency: Daily Modality: individual Progress: 70%  Related Problem: Restore restful sleep pattern. Description: Describe the history and details of sleep pattern. Target Date: 2022-08-14 Frequency: Daily Modality: individual Progress: 80%    Client Response full compliance  Service Location Location, [address] [place], [place] [address]  Service Code cpt [phone] P Self-monitoring  Lifestyle change (exercise, nutrition)  Self care activities  Distress tolerance skill  Identify/label emotions  Identify automatic thoughts  Facilitate problem solving  Normalize/Reframe  Behavioral activation plan  Validate/empathize  Comments  Dx.: Generalized Anxiety (F41.1)  Meds: No psychotropics Goals: [name] is seeking to develop a more satisfying life-style that takes into account her current physical condition. Goal date 11-04-20. States that the current situation in the world (politics and Covid 19) is making her very anxious. She is "obsessed" with the news (social media) and remains in a constant state of stress. She wants to learn to be less reactive. Will initiate behaviorally oriented individual therapy to address symptoms. Goal date is 10-04-21. While patient has realized some improvement in level of anxiety, she still expresses considerable stress and anxiety. Continues to need better balance in life. Revised date is 6-24.  Patient agrees to a Engineer, structural session. She is at home and I am at my home office.   [name] says she went to memory class today and there is one more. She found the memory strategies very helpful. She hopes to employ some of the suggestions. She is also taking some other self help classes. She talked about her anxieties associated with her financial security. We talked about her conservative approach to finances and that her anxieties are mostly unfounded. She recognizes if she asks herself the "right questions" she will realize that her fears are unrealistic.                                              `  Joy Morel, PhD  Time: 3:10p-4:00p 50 minutes

## 2022-05-03 ENCOUNTER — Other Ambulatory Visit (HOSPITAL_COMMUNITY): Payer: Self-pay

## 2022-05-09 ENCOUNTER — Ambulatory Visit (INDEPENDENT_AMBULATORY_CARE_PROVIDER_SITE_OTHER): Payer: Medicare PPO | Admitting: Psychology

## 2022-05-09 DIAGNOSIS — F411 Generalized anxiety disorder: Secondary | ICD-10-CM

## 2022-05-09 NOTE — Progress Notes (Addendum)
Date: 05/09/2022  Diagnosis G47.00 (Insomnia disorder) [n/a]  300.02 (Generalized anxiety disorder) [n/a]  Symptoms Complains of difficulty remaining asleep. (Status: maintained) -- No Description Entered  Excessive and/or unrealistic worry that is difficult to control occurring more days than not for at least 6 months about a number of events or activities. (Status: maintained) -- No Description Entered  Medication Status compliance  Safety none  If Suicidal or Homicidal State Action Taken: unspecified  Current Risk: low Medications unspecified Objectives Related Problem: Resolve the core conflict that is the source of anxiety. Description: Describe situations, thoughts, feelings, and actions associated with anxieties and worries, their impact on functioning, and attempts to resolve them. Target Date: 2022-08-14 Frequency: Daily Modality: individual Progress: 80%  Related Problem: Resolve the core conflict that is the source of anxiety. Description: Learn and implement calming skills to reduce overall anxiety and manage anxiety symptoms. Target Date: 2022-08-14 Frequency: Daily Modality: individual Progress: 75%  Related Problem: Resolve the core conflict that is the source of anxiety. Description: Learn and implement problem-solving strategies for realistically addressing worries. Target Date: 2022-08-14 Frequency: Daily Modality: individual Progress: 75%  Related Problem: Resolve the core conflict that is the source of anxiety. Description: Maintain involvement in work, family, and social activities. Target Date: 2022-08-14 Frequency: Daily Modality: individual Progress: 70%  Related Problem: Restore restful sleep pattern. Description: Describe the history and details of sleep pattern. Target Date: 2022-08-14 Frequency: Daily Modality: individual Progress: 80%    Client Response full compliance  Service  Location Location, [address] [place], [place] [address]  Service Code cpt [phone] P Self-monitoring  Lifestyle change (exercise, nutrition)  Self care activities  Distress tolerance skill  Identify/label emotions  Identify automatic thoughts  Facilitate problem solving  Normalize/Reframe  Behavioral activation plan  Validate/empathize  Comments  Dx.: Generalized Anxiety (F41.1)  Meds: No psychotropics Goals: Ailyn is seeking to develop a more satisfying life-style that takes into account her current physical condition. Goal date 11-04-20. States that the current situation in the world (politics and Covid 19) is making her very anxious. She is "obsessed" with the news (social media) and remains in a constant state of stress. She wants to learn to be less reactive. Will initiate behaviorally oriented individual therapy to address symptoms. Goal date is 10-04-21. While patient has realized some improvement in level of anxiety, she still expresses considerable stress and anxiety. Continues to need better balance in life. Revised date is 6-24.  Patient agrees to a Engineer, structural session. She is at home and I am at my home office.   Royale has signed up for a number of different classes and is excited to participate. 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 She reports that her moods are stable and that she is very optimistic about her increased community involvement. Her stress is currently manageable and not causing her any associated symptoms. She is starting to make plans for her will. This is a function of her age and need for estate planning.                                     `  [name], PhD  Time: 3:10p-4:00p 50 minutes

## 2022-05-16 ENCOUNTER — Ambulatory Visit (INDEPENDENT_AMBULATORY_CARE_PROVIDER_SITE_OTHER): Payer: Medicare PPO | Admitting: Psychology

## 2022-05-16 DIAGNOSIS — F411 Generalized anxiety disorder: Secondary | ICD-10-CM

## 2022-05-17 DIAGNOSIS — G4733 Obstructive sleep apnea (adult) (pediatric): Secondary | ICD-10-CM | POA: Diagnosis not present

## 2022-05-17 DIAGNOSIS — I10 Essential (primary) hypertension: Secondary | ICD-10-CM | POA: Diagnosis not present

## 2022-05-17 NOTE — Progress Notes (Addendum)
Date: 05/17/2022  Diagnosis G47.00 (Insomnia disorder) [n/a]  300.02 (Generalized anxiety disorder) [n/a]  Symptoms Complains of difficulty remaining asleep. (Status: maintained) -- No Description Entered  Excessive and/or unrealistic worry that is difficult to control occurring more days than not for at least 6 months about a number of events or activities. (Status: maintained) -- No Description Entered  Medication Status compliance  Safety none  If Suicidal or Homicidal State Action Taken: unspecified  Current Risk: low Medications unspecified Objectives Related Problem: Resolve the core conflict that is the source of anxiety. Description: Describe situations, thoughts, feelings, and actions associated with anxieties and worries, their impact on functioning, and attempts to resolve them. Target Date: 2022-08-14 Frequency: Daily Modality: individual Progress: 80%  Related Problem: Resolve the core conflict that is the source of anxiety. Description: Learn and implement calming skills to reduce overall anxiety and manage anxiety symptoms. Target Date: 2022-08-14 Frequency: Daily Modality: individual Progress: 75%  Related Problem: Resolve the core conflict that is the source of anxiety. Description: Learn and implement problem-solving strategies for realistically addressing worries. Target Date: 2022-08-14 Frequency: Daily Modality: individual Progress: 75%  Related Problem: Resolve the core conflict that is the source of anxiety. Description: Maintain involvement in work, family, and social activities. Target Date: 2022-08-14 Frequency: Daily Modality: individual Progress: 70%  Related Problem: Restore restful sleep pattern. Description: Describe the history and details of sleep pattern. Target Date: 2022-08-14 Frequency: Daily Modality: individual Progress: 80%    Client  Response full compliance  Service Location Location, [address] [place], [place] [address]  Service Code cpt [phone] Self-monitoring  Lifestyle change (exercise, nutrition)  Self care activities  Distress tolerance skill  Identify/label emotions  Identify automatic thoughts  Facilitate problem solving  Normalize/Reframe  Behavioral activation plan  Validate/empathize  Comments  Dx.: Generalized Anxiety (F41.1)  Meds: No psychotropics Goals: [name] is seeking to develop a more satisfying life-style that takes into account her current physical condition. Goal date 11-04-20. States that the current situation in the world (politics and Covid 19) is making her very anxious. She is "obsessed" with the news (social media) and remains in a constant state of stress. She wants to learn to be less reactive. Will initiate behaviorally oriented individual therapy to address symptoms. Goal date is 10-04-21. While patient has realized some improvement in level of anxiety, she still expresses considerable stress and anxiety. Continues to need better balance in life. Revised date is 6-24.  Patient agrees to a Engineer, structural session. She is at home and I am at my home office.   [name] states that she has been focused on resolving issues related to her (and her sister's) will. They are working with an attorney to determine executor and other related issues. This has been consuming a lot of her thoughts lately. In addition, she and her sister have been activie in attending a number of community events. She is hoping to also get involved politically. We talked about making sure she cares for herself by not getting consumed with media political reports, as she did during the last election cycle. That was very distressing. 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                                      `  [name], PhD  Time: 3:10p-4:00p 50 minutes

## 2022-05-18 ENCOUNTER — Encounter: Payer: Self-pay | Admitting: *Deleted

## 2022-05-22 ENCOUNTER — Encounter: Payer: Self-pay | Admitting: Neurology

## 2022-05-22 ENCOUNTER — Ambulatory Visit: Payer: Medicare PPO | Admitting: Neurology

## 2022-05-22 VITALS — BP 146/68 | HR 56 | Ht 60.25 in | Wt 184.0 lb

## 2022-05-22 DIAGNOSIS — G4733 Obstructive sleep apnea (adult) (pediatric): Secondary | ICD-10-CM

## 2022-05-22 DIAGNOSIS — E669 Obesity, unspecified: Secondary | ICD-10-CM | POA: Diagnosis not present

## 2022-05-22 DIAGNOSIS — R351 Nocturia: Secondary | ICD-10-CM

## 2022-05-22 DIAGNOSIS — G4700 Insomnia, unspecified: Secondary | ICD-10-CM | POA: Diagnosis not present

## 2022-05-22 NOTE — Progress Notes (Signed)
Subjective:    Patient ID: [name] is a 77 y.o. female.  HPI    [name], MD, PhD [facility] [address], [address]O. [place], [place] [address]  Dear Dr. Nancy Fetter,  I saw your patient, [name], upon your kind request in my sleep clinic today for re-evaluation of her sleep apnea the patient is unaccompanied today.  A all s you know, [name] is a 77 year old female with an underlying complex medical history of hypertension, hypothyroidism, postmenopausal bleeding, vitamin D deficiency, allergies, anemia, anxiety, interstitial cystitis, osteoarthritis, and obesity, who was previously diagnosed with obstructive sleep apnea and has been on PAP therapy.  Her Epworth sleepiness score is 9 out of 24, fatigue severity score is 29 out of 63.  She has trouble maintaining sleep, she has not tried any over-the-counter medication and would like to avoid adding any medication.  She is divorced, she lives with her younger sister.  She has no children.  She is compliant with her CPAP, it is not running properly as the water chamber is jammed into it and does not come out but essentially she can still use the machine, she should be eligible for new equipment.   Her bedtime is generally between 9 and 10 PM and rise time varies, depends on how well she slept.  She has nocturia about 2-3 times per average night.  Her weight has been fluctuating but more or less has kept the same.  She drinks caffeine in the form of coffee, 1 cup in the morning and occasional decaf coffee during the day.  She uses a nasal pillows interface.  I had evaluated her several years ago at the request of her primary care nurse practitioner at the time.  She was originally diagnosed with obstructive sleep apnea several years prior.  Her baseline sleep study from 01/11/2016 showed an AHI of 12.4/h, REM AHI of 41.3/h, supine AHI of 22.3/h, O2 nadir 84%.  She had a CPAP titration study on  02/17/2016.  I was able to review her CPAP compliance data from her machine, she has a ResMed air sense 10 AutoSet machine.  Her set up date was 04/06/2016.  During the last month between 04/22/2022 and 05/21/2022, she used her machine 30 out of 30 days with percent use days greater than 4 hours at 100%, average usage of 7 hours and 22 minutes, average AHI 3.7/h, leak on the low side with the 95th percentile at 0.9 L/min on a pressure of 8 cm with EPR of 1.  Previously:   06/26/16: 77 year old right-handed woman with an underlying medical history of hypertension, osteoarthritis, hypothyroidism, anxiety, and obesity, who presents for follow-up consultation of her obstructive sleep apnea, after recent sleep study testing. The patient is unaccompanied today. I first met her on 11/24/2015 at the request of her primary care provider, at which time she reported a prior diagnosis of OSA. She needed reevaluation. She had a CPAP machine. I invited her for a sleep study. She had a baseline sleep study, followed by a CPAP titration study. Her baseline sleep study from 01/11/2016 showed a sleep efficiency of only 42.8%, sleep latency was 146 minutes which is markedly delayed, REM latency was also delayed at 189.5 minutes. She had an increased percentage of stage I sleep, and a decreased percentage of REM sleep. Total AHI was 12.4 per hour, REM AHI was 41.3 per hour, supine AHI was 22.3 per hour. Average oxygen saturation was 98%, nadir was  84%. She had mild PLMS with an index of 29.5 per hour and minimal arousals. Based on her medical history and test results and sleep related complaints I invited her for a full night CPAP titration study which she had on 02/17/2016. Sleep efficiency was only 22%, sleep latency was 128.5 minutes, REM was absent. She had an increased percentage of slow-wave sleep, an increased percentage of stage I sleep. CPAP was titrated from 5 cm to 6 cm. She was fitted with a small nasal mask. She had no  significant PLMS. I did prescribe CPAP therapy at a pressure of 6 cm based on her test results which were limited.   I reviewed her CPAP compliance data from 05/26/2016 through 06/24/2016 which is a total of 30 days, during which time she used her machine every night with percent used days greater than 4 hours at 100%, indicating superb compliance with an average usage of 7 hours and 31 minutes, residual AHI borderline at 6 per hour, leak low with the 95th percentile at 1.8 L/m on a pressure of 6 cm with EPR of 2. She reports that she has trouble maintaining sleep. She usually falls asleep okay. She tries to keep a schedule. She works from home. She has no trouble using this new machine but did not dislike her old machine. She admits that she has trouble adapting to something new. Sometimes she wakes up after 6 hours of sleep and cannot go back to sleep. It used to be where she woke up 2 or 3 hours after falling asleep and while this is better she would like to be able to sleep at least 7 hours. She does not like to take medication for sleep. She can drink some milk at times and goes back to sleep after that. She reports no significant change in how she feels. She did not notice a big change in the pressure. She reports that at the time of her second sleep study she had a cough. She then had a upper respiratory infection shortly there after. She feels better in that regard, she has some allergy symptoms which flareup from time to time.     11/24/2015: She was previously diagnosed with obstructive sleep apnea years ago and placed on CPAP therapy. Prior sleep study results are not available for my review. She has been on CPAP therapy. She has an older CPAP machine, ResMed S8 Elite, and I reviewed her CPAP compliance data from 05/23/2015 through 11/18/2015 which is a total of 180 days during which time she used her machine every night with percent used days greater than 4 hours at 99%, indicating superb  compliance, with an average usage of 7 hours and 36 minutes, residual AHI borderline at 5.2 per hour, leak acceptable with the 95th percentile at 16.8 L/m on a pressure of 11 cm. She reports daytime somnolence and her Epworth sleepiness score is 14 out of 24 today, her fatigue score is 58 out of 63. She also reports difficulty maintaining sleep especially after she goes to the bathroom at night. I reviewed your office note from 11/16/2015. She reports significant anxiety especially at night and especially in the middle of the night when she cannot go back to sleep. A lot of her anxiety seems to surround her work situation. She says she has always been an anxious person. She is single. She has no children. She generally lives alone in her home but her sister has been staying with her a lot. She has no  other siblings. Sister does not have sleep apnea. Patient has been working through her anxiety with relaxation, meditation, drinking chamomile tea which often is a hit or miss. She would not want to consider medication for anxiety she states. She is a nonsmoker, drinks alcohol infrequently, drinks caffeine in the form of coffee, one cup in the morning and generally speaking noncaffeine tea one or 2 cups per day, not much water, maybe up to 3 cups per day. Her bedtime is around 10 PM and while she does not have difficulty falling asleep she will wake up in the early morning hours and usually goes to bathroom once or twice per night on average, sometimes more, and has difficulty going back to sleep. She does take a nap during the day when she is sedentary, typically no planned naps. She denies restless leg symptoms but has noted twitching in her sleep and leg movements but while it used to be significant enough to disheveled her sheets, does not seem to do that any longer. She denies morning headaches. Wake up time is around 6 AM. 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 When she  cannot go back to sleep in the middle of the night, she starts using her computer or eye pad and goes on twitch her and starts worrying about politics and her job.     Her Past Medical History Is Significant For: Past Medical History:  Diagnosis Date   Abnormal perimenopausal bleeding 03/07/2003   Allergy    Anemia    Anxiety    Arthritis    Dysuria 03/06/2002   Endometrial polyp 03/06/2004   Environmental allergies    Fibroid 02/19/2003   H/O varicella    History of measles, mumps, or rubella    Hx of colonic polyps 12/15/2010   Hypertension 07/19/2010   echo for pre-op EF 55%  normal left wall thickness LA mildly dialated with mitral and tricuspid regurgatation   Hypothyroidism    Interstitial cystitis    Menopausal symptoms 03/06/2002   Obesity    Osteoarthritis    of knees   Osteopenia 02/04/2012   -1.8 T score right femur neck   PMB (postmenopausal bleeding) 06/21/2010   Sleep apnea    Urinary incontinence 03/06/2009   Vitamin D deficiency    history of    Her Past Surgical History Is Significant For: Past Surgical History:  Procedure Laterality Date   COLONOSCOPY  12/15/10   HYSTEROSCOPY  2006 and May 2012   for fibroids, endometrial polyps   MOUTH SURGERY     MYOMECTOMY  1974    Her Family History Is Significant For: Family History  Problem Relation Age of Onset   Kidney disease Father        from uremia   Benign prostatic hyperplasia Father    Hypertension Mother    Colon cancer Paternal Uncle    Clotting disorder Maternal Grandmother        ????    Her Social History Is Significant For: Social History   Socioeconomic History   Marital status: Divorced    Spouse name: n/a   Number of children: 0   Years of education: college   Highest education level: Not on file  Occupational History   Not on file  Tobacco Use   Smoking status: Never   Smokeless tobacco: Never  Substance and Sexual Activity   Alcohol use: Yes    Comment: very rare,  maybe a couple  drinks in a month   Drug use: No   Sexual activity: Not Currently    Birth control/protection: Abstinence  Other Topics Concern   Not on file  Social History Narrative   Lives alone.   Sister stays with her frequently.   Participates in [employer] exercise program.   Social Determinants of Health   Financial Resource Strain: Not on file  Food Insecurity: Not on file  Transportation Needs: Not on file  Physical Activity: Not on file  Stress: Not on file  Social Connections: Not on file    Her Allergies Are:  Allergies  Allergen Reactions   Bactrim [Sulfamethoxazole-Trimethoprim]    Epinephrine     shakey   Erythromycin    Sulfa Antibiotics Rash  :   Her Current Medications Are:  Outpatient Encounter Medications as of 05/22/2022  Medication Sig   Ascorbic Acid (VITAMIN C) 1000 MG tablet Take 1,000 mg by mouth daily. One tablet a day   benazepril-hydrochlorthiazide (LOTENSIN HCT) 20-12.5 MG tablet Take 2 tablets by mouth daily.   cholecalciferol (VITAMIN D3) 25 MCG (1000 UNIT) tablet Take 1,000 Units by mouth daily.   hydrALAZINE (APRESOLINE) 25 MG tablet TAKE 2 TABLETS BY MOUTH EVERY MORNING, THEN 1 TABLET BY MOUTH AT LUNCH AND 2 TABLETS AT DINNER   levothyroxine (SYNTHROID) 112 MCG tablet Take 1 tablet (112 mcg total) by mouth daily before breakfast.   liothyronine (CYTOMEL) 5 MCG tablet Take 1 tablet by mouth before breakfast   metoprolol succinate (TOPROL-XL) 25 MG 24 hr tablet TAKE 1 TABLET(25 MG) BY MOUTH DAILY   acetaminophen (TYLENOL) 325 MG tablet Take 325 mg by mouth every 4 (four) hours as needed. (Patient not taking: Reported on 05/22/2022)   ibuprofen (ADVIL) 400 MG tablet Take 400 mg by mouth 3 (three) times daily. (Patient not taking: Reported on 05/22/2022)   [DISCONTINUED] cholecalciferol (VITAMIN D3) 25 MCG (1000 UNIT) tablet Take 2,000 Units by mouth daily.   No facility-administered encounter medications on file as of 05/22/2022.   : Review of Systems:  Out of a complete 14 point review of systems, all are reviewed and negative with the exception of these symptoms as listed below:  Review of Systems  Neurological:        Patient is here alone for consultation to be re-established for OSA treatment here. She was last seen in 2018. She has been using her CPAP but she states the water chamber no longer detaches from the machine so she needs a new one. She has turned off her humidifier. She only gets about 5-6 hours of sleep at night. She states if she has to have another sleep study, she needs to do it at home due to difficulties traveling. She states she did not know she was supposed to return yearly for prescriptions. ESS 9 FSS 29.     Objective:  Neurological Exam  Physical Exam Physical Examination:   Vitals:   05/22/22 1040  BP: (!) 146/68  Pulse: (!) 56    General Examination: The patient is a very pleasant 77 y.o. female in no acute distress. She appears well-developed and well-nourished and well groomed.   HEENT: Normocephalic, atraumatic, pupils are equal, round and reactive to light, extraocular tracking is good without limitation to gaze excursion or nystagmus noted. Hearing is grossly intact. Face is symmetric with normal facial animation. Speech is clear with no dysarthria noted. There is no hypophonia. There is no lip, neck/head, jaw or voice tremor. Neck is supple with  full range of passive and active motion. There are no carotid bruits on auscultation. Oropharynx exam reveals: Mild to moderate mouth dryness, adequate dental hygiene and moderate airway crowding, due to redundant soft palate and prominent uvula, tonsils on the smaller side, neck circumference 15-1/4 inches.  Mild overbite noted.  Tongue protrudes centrally and palate elevates symmetrically.  Chest: Clear to auscultation without wheezing, rhonchi or crackles noted.  Heart: S1+S2+0, regular and normal without murmurs, rubs or gallops  noted.   Abdomen: Soft, non-tender and non-distended.  Extremities: There is trace pitting edema in the distal lower extremities bilaterally.   Skin: Warm and dry without trophic changes noted.   Musculoskeletal: exam reveals no obvious joint deformities.   Neurologically:  Mental status: The patient is awake, alert and oriented in all 4 spheres. Her immediate and remote memory, attention, language skills and fund of knowledge are appropriate. There is no evidence of aphasia, agnosia, apraxia or anomia. Speech is clear with normal prosody and enunciation. Thought process is linear. Mood is normal and affect is normal.  Cranial nerves II - XII are as described above under HEENT exam.  Motor exam: Normal bulk, strength and tone is noted. There is no obvious action or resting tremor.  Fine motor skills and coordination: grossly intact.  Cerebellar testing: No dysmetria or intention tremor. There is no truncal or gait ataxia.  Sensory exam: intact to light touch in the upper and lower extremities.  Gait, station and balance: She stands easily. No veering to one side is noted. No leaning to one side is noted. Posture is age-appropriate and stance is narrow based. Gait shows normal stride length and normal pace. No problems turning are noted.   Assessment and Plan:  In summary, [name] is a very pleasant 77 y.o.-year old female with an underlying complex medical history of hypertension, hypothyroidism, postmenopausal bleeding, vitamin D deficiency, allergies, anemia, anxiety, interstitial cystitis, osteoarthritis, and obesity, who presents for evaluation of her obstructive sleep apnea.  She carries a long standing diagnosis of obstructive sleep apnea and had reevaluation through our office in 2017.  She has had this particular CPAP machine since the beginning of 2018.  She is eligible for new equipment.  She is compliant with treatment and apnea scores look good, she is commended for  treatment adherence.  She has trouble maintaining sleep and is encouraged to try low-dose melatonin.  She is encouraged to continue to work on weight loss.  She is advised to proceed at this point with a home sleep test for reevaluation of her sleep apnea and to qualify for new equipment.  I explained the home sleep test procedure to the patient.  She is agreeable to proceeding, she is reminded not to use her CPAP at night.  We will plan a follow-up after testing.  We briefly talked about alternative treatment options including weight loss, using a dental device and surgical options.  We mutually agreed that she would most likely do best with continuing PAP therapy after testing.  I answered all her questions today and she was in agreement with our plan.   Thank you very much for allowing me to participate in the care of this nice patient. If I can be of any further assistance to you please do not hesitate to call me at [phone].  Sincerely,   [name], MD, PhD

## 2022-05-22 NOTE — Patient Instructions (Addendum)
It was nice to see you again today!   Here is what we discussed today:    Based on your symptoms and your exam I believe you are still at risk for obstructive sleep apnea (aka OSA). As discussed, we will proceed with a home sleep test to re-assess your sleep apnea and how severe it is. Even, if you have mild OSA, I may want you to consider ongoing treatment with CPAP, as treatment of even borderline or mild sleep apnea can result and improvement of symptoms such as sleep disruption, daytime sleepiness, nighttime bathroom breaks, restless leg symptoms, improvement of headache syndromes, even improved mood disorder.   As explained, an attended sleep study (meaning you get to stay overnight in the sleep lab), lets [place] monitor sleep-related behaviors such as sleep talking and leg movements in sleep, in addition to monitoring for sleep apnea.  A home sleep test is a screening tool for sleep apnea diagnosis only, but unfortunately, does not help with any other sleep-related diagnoses.  Please remember, the long-term risks and ramifications of untreated moderate to severe obstructive sleep apnea may include (but are not limited to): increased risk for cardiovascular disease, including congestive heart failure, stroke, difficult to control hypertension, treatment resistant obesity, arrhythmias, especially irregular heartbeat commonly known as A. Fib. (atrial fibrillation); even type 2 diabetes has been linked to untreated OSA.   Other correlations that untreated obstructive sleep apnea include macular edema which is swelling of the retina in the eyes, droopy eyelid syndrome, and elevated hemoglobin and hematocrit levels (often referred to as polycythemia).  Sleep apnea can cause disruption of sleep and sleep deprivation in most cases, which, in turn, can cause recurrent headaches, problems with memory, mood, concentration, focus, and vigilance. Most people with untreated sleep apnea report excessive daytime  sleepiness, which can affect their ability to drive. Please do not drive or use heavy equipment or machinery, if you feel sleepy! Patients with sleep apnea can also develop difficulty initiating and maintaining sleep (aka insomnia).   Having sleep apnea may increase your risk for other sleep disorders, including involuntary behaviors sleep such as sleep terrors, sleep talking, sleepwalking.    Having sleep apnea can also increase your risk for restless leg syndrome and leg movements at night.   Please note that untreated obstructive sleep apnea may carry additional perioperative morbidity. Patients with significant obstructive sleep apnea (typically, in the moderate to severe degree) should receive, if possible, perioperative PAP (positive airway pressure) therapy and the surgeons and particularly the anesthesiologists should be informed of the diagnosis and the severity of the sleep disordered breathing.   We will call you or email you through [place] with regards to your test results and plan a follow-up in sleep clinic accordingly. Most likely, you will hear from one of our nurses.   Our sleep lab administrative assistant will call you to schedule your sleep study and give you further instructions, regarding the check in process for the sleep study, arrival time, what to bring, when you can expect to leave after the study, etc., and to answer any other logistical questions you may have. If you don't hear back from her by about 2 weeks from now, please feel free to call her direct line at [phone] or you can call our general clinic number, or email [place] through My Chart.   As discussed, please do not use your CPAP machine at home during the night of testing with a home sleep test equipment as we need diagnostic  data, not treatment data.  You can resume using her CPAP regularly after that.    You can try Melatonin at night for sleep: take 1 mg to 3 mg, one to 2 hours before your bedtime. It is  over the counter and comes in pill form, chewable form and spray, if you prefer.

## 2022-05-23 ENCOUNTER — Ambulatory Visit (INDEPENDENT_AMBULATORY_CARE_PROVIDER_SITE_OTHER): Payer: Medicare PPO | Admitting: Psychology

## 2022-05-23 DIAGNOSIS — F411 Generalized anxiety disorder: Secondary | ICD-10-CM | POA: Diagnosis not present

## 2022-05-23 NOTE — Progress Notes (Addendum)
Date: 05/23/2022  Diagnosis G47.00 (Insomnia disorder) [n/a]  300.02 (Generalized anxiety disorder) [n/a]  Symptoms Complains of difficulty remaining asleep. (Status: maintained) -- No Description Entered  Excessive and/or unrealistic worry that is difficult to control occurring more days than not for at least 6 months about a number of events or activities. (Status: maintained) -- No Description Entered  Medication Status compliance  Safety none  If Suicidal or Homicidal State Action Taken: unspecified  Current Risk: low Medications unspecified Objectives Related Problem: Resolve the core conflict that is the source of anxiety. Description: Describe situations, thoughts, feelings, and actions associated with anxieties and worries, their impact on functioning, and attempts to resolve them. Target Date: 2022-08-14 Frequency: Daily Modality: individual Progress: 80%  Related Problem: Resolve the core conflict that is the source of anxiety. Description: Learn and implement calming skills to reduce overall anxiety and manage anxiety symptoms. Target Date: 2022-08-14 Frequency: Daily Modality: individual Progress: 75%  Related Problem: Resolve the core conflict that is the source of anxiety. Description: Learn and implement problem-solving strategies for realistically addressing worries. Target Date: 2022-08-14 Frequency: Daily Modality: individual Progress: 75%  Related Problem: Resolve the core conflict that is the source of anxiety. Description: Maintain involvement in work, family, and social activities. Target Date: 2022-08-14 Frequency: Daily Modality: individual Progress: 70%  Related Problem: Restore restful sleep pattern. Description: Describe the history and details of sleep pattern. Target Date: 2022-08-14 Frequency: Daily Modality: individual Progress: 80%    Client Response full compliance  Service Location Location, [address] [place],  [place] [address]  Service Code cpt [phone] P Self-monitoring  Lifestyle change (exercise, nutrition)  Self care activities  Distress tolerance skill  Identify/label emotions  Identify automatic thoughts  Facilitate problem solving  Normalize/Reframe  Behavioral activation plan  Validate/empathize  Comments  Dx.: Generalized Anxiety (F41.1)  Meds: No psychotropics Goals: Shuntae is seeking to develop a more satisfying life-style that takes into account her current physical condition. Goal date 11-04-20. States that the current situation in the world (politics and Covid 19) is making her very anxious. She is "obsessed" with the news (social media) and remains in a constant state of stress. She wants to learn to be less reactive. Will initiate behaviorally oriented individual therapy to address symptoms. Goal date is 10-04-21. While patient has realized some improvement in level of anxiety, she still expresses considerable stress and anxiety. Continues to need better balance in life. Revised date is 6-24.  Patient agrees to a Engineer, structural session. She is at home and I am at my home office.   Jahzaria had trouble signing in to [employer] today She described the process of writing and has been active in writing her poetry recently. She has been focused on her will lately and is trying to determine what she wants written in her living will. She tends to "over think" and have considerable unnecessary worry. We talked about the best ways for [facility] to relax. She finds that writing poetry and cooking as a way too relax. She also finds that eating helps calm her. She would like to get better control over her eating to help control her weight. Talked about potential options to gain better control of her food intake and rely on other methods to soothe her anxiety.                                             `  [name], PhD  Time: 3:10p-4:00p 50 minutes

## 2022-05-30 ENCOUNTER — Ambulatory Visit (INDEPENDENT_AMBULATORY_CARE_PROVIDER_SITE_OTHER): Payer: Medicare PPO | Admitting: Psychology

## 2022-05-30 DIAGNOSIS — F411 Generalized anxiety disorder: Secondary | ICD-10-CM | POA: Diagnosis not present

## 2022-05-30 NOTE — Progress Notes (Addendum)
Date: 05/30/2022  Diagnosis G47.00 (Insomnia disorder) [n/a]  300.02 (Generalized anxiety disorder) [n/a]  Symptoms Complains of difficulty remaining asleep. (Status: maintained) -- No Description Entered  Excessive and/or unrealistic worry that is difficult to control occurring more days than not for at least 6 months about a number of events or activities. (Status: maintained) -- No Description Entered  Medication Status compliance  Safety none  If Suicidal or Homicidal State Action Taken: unspecified  Current Risk: low Medications unspecified Objectives Related Problem: Resolve the core conflict that is the source of anxiety. Description: Describe situations, thoughts, feelings, and actions associated with anxieties and worries, their impact on functioning, and attempts to resolve them. Target Date: 2022-08-14 Frequency: Daily Modality: individual Progress: 80%  Related Problem: Resolve the core conflict that is the source of anxiety. Description: Learn and implement calming skills to reduce overall anxiety and manage anxiety symptoms. Target Date: 2022-08-14 Frequency: Daily Modality: individual Progress: 75%  Related Problem: Resolve the core conflict that is the source of anxiety. Description: Learn and implement problem-solving strategies for realistically addressing worries. Target Date: 2022-08-14 Frequency: Daily Modality: individual Progress: 75%  Related Problem: Resolve the core conflict that is the source of anxiety. Description: Maintain involvement in work, family, and social activities. Target Date: 2022-08-14 Frequency: Daily Modality: individual Progress: 70%  Related Problem: Restore restful sleep pattern. Description: Describe the history and details of sleep pattern. Target Date: 2022-08-14 Frequency:  Daily Modality: individual Progress: 80%    Client Response full compliance  Service Location Location, [address] [place], [place] [address]  Service Code cpt [phone] Self-monitoring  Lifestyle change (exercise, nutrition)  Self care activities  Distress tolerance skill  Identify/label emotions  Identify automatic thoughts  Facilitate problem solving  Normalize/Reframe  Behavioral activation plan  Validate/empathize  Comments  Dx.: Generalized Anxiety (F41.1)  Meds: No psychotropics Goals: [name] is seeking to develop a more satisfying life-style that takes into account her current physical condition. Goal date 11-04-20. States that the current situation in the world (politics and Covid 19) is making her very anxious. She is "obsessed" with the news (social media) and remains in a constant state of stress. She wants to learn to be less reactive. Will initiate behaviorally oriented individual therapy to address symptoms. Goal date is 10-04-21. While patient has realized some improvement in level of anxiety, she still expresses considerable stress and anxiety. Continues to need better balance in life. Revised date is 6-24.  Patient agrees to a Engineer, structural session. She is at home and I am at my home office.   [name] states that her sister has been in the hospital with blood clots, but is being discharged today. It has been difficult and scary for [name], but she feels better that her sister is coming home. She is managing relatively well given the circumstances. We talked about her role in her sister's care post-discharge. Reports that she need to have another sleep study so she can get another c-pap machine.                                                  `  Joy Morel, PhD  Time: 3:10p-4:00p 50 minutes

## 2022-05-31 ENCOUNTER — Telehealth: Payer: Self-pay | Admitting: Neurology

## 2022-05-31 NOTE — Telephone Encounter (Signed)
HST- [employer] no auth req   Patient is scheduled at [facility] For 06/27/22 at 9:30 AM.  Emailed packet to the patient.

## 2022-06-06 ENCOUNTER — Ambulatory Visit (INDEPENDENT_AMBULATORY_CARE_PROVIDER_SITE_OTHER): Payer: Medicare PPO | Admitting: Psychology

## 2022-06-06 DIAGNOSIS — F411 Generalized anxiety disorder: Secondary | ICD-10-CM | POA: Diagnosis not present

## 2022-06-06 NOTE — Progress Notes (Signed)
Date: 06/06/2022  Diagnosis G47.00 (Insomnia disorder) [n/a]  300.02 (Generalized anxiety disorder) [n/a]  Symptoms Complains of difficulty remaining asleep. (Status: maintained) -- No Description Entered  Excessive and/or unrealistic worry that is difficult to control occurring more days than not for at least 6 months about a number of events or activities. (Status: maintained) -- No Description Entered  Medication Status compliance  Safety none  If Suicidal or Homicidal State Action Taken: unspecified  Current Risk: low Medications unspecified Objectives Related Problem: Resolve the core conflict that is the source of anxiety. Description: Describe situations, thoughts, feelings, and actions associated with anxieties and worries, their impact on functioning, and attempts to resolve them. Target Date: 2022-08-14 Frequency: Daily Modality: individual Progress: 80%  Related Problem: Resolve the core conflict that is the source of anxiety. Description: Learn and implement calming skills to reduce overall anxiety and manage anxiety symptoms. Target Date: 2022-08-14 Frequency: Daily Modality: individual Progress: 75%  Related Problem: Resolve the core conflict that is the source of anxiety. Description: Learn and implement problem-solving strategies for realistically addressing worries. Target Date: 2022-08-14 Frequency: Daily Modality: individual Progress: 75%  Related Problem: Resolve the core conflict that is the source of anxiety. Description: Maintain involvement in work, family, and social activities. Target Date: 2022-08-14 Frequency: Daily Modality: individual Progress: 70%  Related Problem: Restore restful sleep pattern. Description: Describe the history and details of sleep pattern. Target  Date: 2022-08-14 Frequency: Daily Modality: individual Progress: 80%    Client Response full compliance  Service Location Location, [address] [place], [place] [address]  Service Code cpt [phone] P Self-monitoring  Lifestyle change (exercise, nutrition)  Self care activities  Distress tolerance skill  Identify/label emotions  Identify automatic thoughts  Facilitate problem solving  Normalize/Reframe  Behavioral activation plan  Validate/empathize  Comments  Dx.: Generalized Anxiety (F41.1)  Meds: No psychotropics Goals: Kyna is seeking to develop a more satisfying life-style that takes into account her current physical condition. Goal date 11-04-20. States that the current situation in the world (politics and Covid 19) is making her very anxious. She is "obsessed" with the news (social media) and remains in a constant state of stress. She wants to learn to be less reactive. Will initiate behaviorally oriented individual therapy to address symptoms. Goal date is 10-04-21. While patient has realized some improvement in level of anxiety, she still expresses considerable stress and anxiety. Continues to need better balance in life. Revised date is 6-24.  Patient agrees to a Engineer, structural session. She is at home and I am at my home office.   Santoya states she is very upset and worried about her sister who had an embolism. Her doctor suggested she get a colonoscopy and mammogram. He sister does not want to do either of the tests. We talked about ways for her to try to keep anxiety low and to remain focused. Has a lot of anticipatory anxiety, fearing what life will be like without her  sister. She agrees to take a meditation class to help her relax. Will continue to make plans to be active at [facility] and community.                                                      `                                                                                                                   [name], PhD  Time: 3:10p-4:00p 50 minutes

## 2022-06-13 ENCOUNTER — Ambulatory Visit: Payer: Medicare PPO | Admitting: Psychology

## 2022-06-13 ENCOUNTER — Ambulatory Visit (INDEPENDENT_AMBULATORY_CARE_PROVIDER_SITE_OTHER): Payer: Medicare PPO | Admitting: Psychology

## 2022-06-13 DIAGNOSIS — F411 Generalized anxiety disorder: Secondary | ICD-10-CM | POA: Diagnosis not present

## 2022-06-13 NOTE — Progress Notes (Signed)
Date: 06/13/2022  Diagnosis G47.00 (Insomnia disorder) [n/a]  300.02 (Generalized anxiety disorder) [n/a]  Symptoms Complains of difficulty remaining asleep. (Status: maintained) -- No Description Entered  Excessive and/or unrealistic worry that is difficult to control occurring more days than not for at least 6 months about a number of events or activities. (Status: maintained) -- No Description Entered  Medication Status compliance  Safety none  If Suicidal or Homicidal State Action Taken: unspecified  Current Risk: low Medications unspecified Objectives Related Problem: Resolve the core conflict that is the source of anxiety. Description: Describe situations, thoughts, feelings, and actions associated with anxieties and worries, their impact on functioning, and attempts to resolve them. Target Date: 2022-08-14 Frequency: Daily Modality: individual Progress: 80%  Related Problem: Resolve the core conflict that is the source of anxiety. Description: Learn and implement calming skills to reduce overall anxiety and manage anxiety symptoms. Target Date: 2022-08-14 Frequency: Daily Modality: individual Progress: 75%  Related Problem: Resolve the core conflict that is the source of anxiety. Description: Learn and implement problem-solving strategies for realistically addressing worries. Target Date: 2022-08-14 Frequency: Daily Modality: individual Progress: 75%  Related Problem: Resolve the core conflict that is the source of anxiety. Description: Maintain involvement in work, family, and social activities. Target Date: 2022-08-14 Frequency: Daily Modality: individual Progress: 70%  Related Problem: Restore restful sleep pattern. Description: Describe the history and  details of sleep pattern. Target Date: 2022-08-14 Frequency: Daily Modality: individual Progress: 80%    Client Response full compliance  Service Location Location, [address] [place], [place] [address]  Service Code cpt [phone] P Self-monitoring  Lifestyle change (exercise, nutrition)  Self care activities  Distress tolerance skill  Identify/label emotions  Identify automatic thoughts  Facilitate problem solving  Normalize/Reframe  Behavioral activation plan  Validate/empathize  Comments  Dx.: Generalized Anxiety (F41.1)  Meds: No psychotropics Goals: Cytlali is seeking to develop a more satisfying life-style that takes into account her current physical condition. Goal date 11-04-20. States that the current situation in the world (politics and Covid 19) is making her very anxious. She is "obsessed" with the news (social media) and remains in a constant state of stress. She wants to learn to be less reactive. Will initiate behaviorally oriented individual therapy to address symptoms. Goal date is 10-04-21. While patient has realized some improvement in level of anxiety, she still expresses considerable stress and anxiety. Continues to need better balance in life. Revised date is 6-24.  Patient agrees to a Film/video editor session. She is at home and I am at my home office.   Malyia says that her sister is "doing a lot better". This is a great relief for Myrakle. She has been active, going out to events through the week. She and her sister will be attending a meditation group over the next few weeks. Feels it will help with her anxiety.  She talked about her sister's mortality and fears that her sister will die before she does. Continues to write her poetry and that has been comforted by the process.                                                        `                                                                                                                   Garrel Ridgel, PhD  Time: 11:40a-12:30p 50 minutes

## 2022-06-20 ENCOUNTER — Ambulatory Visit (INDEPENDENT_AMBULATORY_CARE_PROVIDER_SITE_OTHER): Payer: Medicare PPO | Admitting: Psychology

## 2022-06-20 ENCOUNTER — Ambulatory Visit: Payer: Medicare PPO | Admitting: Psychology

## 2022-06-20 DIAGNOSIS — F411 Generalized anxiety disorder: Secondary | ICD-10-CM

## 2022-06-20 NOTE — Progress Notes (Signed)
Date: 06/20/2022  Diagnosis G47.00 (Insomnia disorder) [n/a]  300.02 (Generalized anxiety disorder) [n/a]  Symptoms Complains of difficulty remaining asleep. (Status: maintained) -- No Description Entered  Excessive and/or unrealistic worry that is difficult to control occurring more days than not for at least 6 months about a number of events or activities. (Status: maintained) -- No Description Entered  Medication Status compliance  Safety none  If Suicidal or Homicidal State Action Taken: unspecified  Current Risk: low Medications unspecified Objectives Related Problem: Resolve the core conflict that is the source of anxiety. Description: Describe situations, thoughts, feelings, and actions associated with anxieties and worries, their impact on functioning, and attempts to resolve them. Target Date: 2022-08-14 Frequency: Daily Modality: individual Progress: 80%  Related Problem: Resolve the core conflict that is the source of anxiety. Description: Learn and implement calming skills to reduce overall anxiety and manage anxiety symptoms. Target Date: 2022-08-14 Frequency: Daily Modality: individual Progress: 75%  Related Problem: Resolve the core conflict that is the source of anxiety. Description: Learn and implement problem-solving strategies for realistically addressing worries. Target Date: 2022-08-14 Frequency: Daily Modality: individual Progress: 75%  Related Problem: Resolve the core conflict that is the source of anxiety. Description: Maintain involvement in work, family, and social activities. Target Date: 2022-08-14 Frequency: Daily Modality: individual Progress: 70%  Related Problem: Restore restful sleep pattern. Description:  Describe the history and details of sleep pattern. Target Date: 2022-08-14 Frequency: Daily Modality: individual Progress: 80%    Client Response full compliance  Service Location Location, [address] [place], [place] [address]  Service Code cpt [phone] P Self-monitoring  Lifestyle change (exercise, nutrition)  Self care activities  Distress tolerance skill  Identify/label emotions  Identify automatic thoughts  Facilitate problem solving  Normalize/Reframe  Behavioral activation plan  Validate/empathize  Comments  Dx.: Generalized Anxiety (F41.1)  Meds: No psychotropics Goals: Zelpha is seeking to develop a more satisfying life-style that takes into account her current physical condition. Goal date 11-04-20. States that the current situation in the world (politics and Covid 19) is making her very anxious. She is "obsessed" with the news (social media) and remains in a constant state of stress. She wants to learn to be less reactive. Will initiate behaviorally oriented individual therapy to address symptoms. Goal date is 10-04-21. While patient has realized some improvement in level of anxiety, she still expresses considerable stress and anxiety. Continues to need better balance in life. Revised date is 6-24.  Patient agrees to a Film/video editor session. She is at home and I am at my home office.   [name] has been going to a medication program. She is enjoying the relaxation. She is frustrated that she is having to do some significant repairs to her bathroom. She is stressed and says that part  of the result is that she wakes up in the middle of the night and she cannot get back to sleep. We discussed her ongoing delima of trying to determine who can be her healthcare power of attorney and executor of will. She has very few family members and all of her friends are older. She wants to be prepared, but is stuck on finding the right  (and available) individuals. We talked about ways to  try relaxation to facilitate her sleep. She says meditation "sometimes" works, but not consistently. Will continue to use these techniques with the hopes that practice will improve results.                                                          `                                                                                                                  Garrel Ridgel, PhD  Time: 8:40a-9:30a 50 minutes

## 2022-06-27 ENCOUNTER — Ambulatory Visit (INDEPENDENT_AMBULATORY_CARE_PROVIDER_SITE_OTHER): Payer: Medicare PPO | Admitting: Neurology

## 2022-06-27 ENCOUNTER — Ambulatory Visit (INDEPENDENT_AMBULATORY_CARE_PROVIDER_SITE_OTHER): Payer: Medicare PPO | Admitting: Psychology

## 2022-06-27 ENCOUNTER — Ambulatory Visit: Payer: Medicare PPO | Admitting: Psychology

## 2022-06-27 DIAGNOSIS — R351 Nocturia: Secondary | ICD-10-CM

## 2022-06-27 DIAGNOSIS — G4733 Obstructive sleep apnea (adult) (pediatric): Secondary | ICD-10-CM

## 2022-06-27 DIAGNOSIS — G4700 Insomnia, unspecified: Secondary | ICD-10-CM

## 2022-06-27 DIAGNOSIS — F411 Generalized anxiety disorder: Secondary | ICD-10-CM

## 2022-06-27 DIAGNOSIS — E669 Obesity, unspecified: Secondary | ICD-10-CM

## 2022-06-27 NOTE — Progress Notes (Signed)
Date: 06/27/2022  Diagnosis G47.00 (Insomnia disorder) [n/a]  300.02 (Generalized anxiety disorder) [n/a]  Symptoms Complains of difficulty remaining asleep. (Status: maintained) -- No Description Entered  Excessive and/or unrealistic worry that is difficult to control occurring more days than not for at least 6 months about a number of events or activities. (Status: maintained) -- No Description Entered  Medication Status compliance  Safety none  If Suicidal or Homicidal State Action Taken: unspecified  Current Risk: low Medications unspecified Objectives Related Problem: Resolve the core conflict that is the source of anxiety. Description: Describe situations, thoughts, feelings, and actions associated with anxieties and worries, their impact on functioning, and attempts to resolve them. Target Date: 2022-08-14 Frequency: Daily Modality: individual Progress: 80%  Related Problem: Resolve the core conflict that is the source of anxiety. Description: Learn and implement calming skills to reduce overall anxiety and manage anxiety symptoms. Target Date: 2022-08-14 Frequency: Daily Modality: individual Progress: 75%  Related Problem: Resolve the core conflict that is the source of anxiety. Description: Learn and implement problem-solving strategies for realistically addressing worries. Target Date: 2022-08-14 Frequency: Daily Modality: individual Progress: 75%  Related Problem: Resolve the core conflict that is the source of anxiety. Description: Maintain involvement in work, family, and social activities. Target Date: 2022-08-14 Frequency: Daily Modality: individual Progress: 70%  Related Problem: Restore restful  sleep pattern. Description: Describe the history and details of sleep pattern. Target Date: 2022-08-14 Frequency: Daily Modality: individual Progress: 80%    Client Response full compliance  Service Location Location, [address] [place], [place] [address]  Service Code cpt [phone] P Self-monitoring  Lifestyle change (exercise, nutrition)  Self care activities  Distress tolerance skill  Identify/label emotions  Identify automatic thoughts  Facilitate problem solving  Normalize/Reframe  Behavioral activation plan  Validate/empathize  Comments  Dx.: Generalized Anxiety (F41.1)  Meds: No psychotropics Goals: [name] is seeking to develop a more satisfying life-style that takes into account her current physical condition. Goal date 11-04-20. States that the current situation in the world (politics and Covid 19) is making her very anxious. She is "obsessed" with the news (social media) and remains in a constant state of stress. She wants to learn to be less reactive. Will initiate behaviorally oriented individual therapy to address symptoms. Goal date is 10-04-21. While patient has realized some improvement in level of anxiety, she still expresses considerable stress and anxiety. Continues to need better balance in life. Revised date is 6-24.  Patient agrees to a Film/video editor session. She is at home and I am at my home office.   [name] says she has an at home sleep study tonight. She and her sister went to [place] for a holiday  lunch. Has been busy and getting out of the house more recently. She talked about her ongoing frustrations with the current political climate. She is harboring very negative feelings that keeps her on edge. Her mediatation is starting to help as she gets more practice. 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                                                                 `                                                                                                                   Garrel Ridgel, PhD  Time: 11:35a-12:30p 55 minutes

## 2022-06-29 NOTE — Progress Notes (Signed)
See procedure note.

## 2022-06-29 NOTE — Addendum Note (Signed)
Addended by: Huston Foley on: 06/29/2022 04:29 PM   Modules accepted: Orders

## 2022-06-29 NOTE — Procedures (Signed)
[facility]  HOME SLEEP TEST (Watch PAT) REPORT  STUDY DATE: 06/27/22  DOB: 1945/08/09  MRN: [id number]  ORDERING CLINICIAN: Huston Foley, MD, PhD   REFERRING CLINICIAN: [name], MD   CLINICAL INFORMATION/HISTORY: 77 year old female with an underlying complex medical history of hypertension, hypothyroidism, postmenopausal bleeding, vitamin D deficiency, allergies, anemia, anxiety, interstitial cystitis, osteoarthritis, and obesity, who has been on CPAP of 8 cm with EPR of 1. She is compliant with treatment and qualifies for new equipment.  Epworth sleepiness score: 9/24.  BMI: 36.4 kg/m  FINDINGS:   Sleep Summary:   Total Recording Time (hours, min): 7 hours, 9 min  Total Sleep Time (hours, min):  4 hours, 53 min  Percent REM (%):    3.9%   Respiratory Indices:   Calculated pAHI (per hour):  29.7/hour  - utilizing the 4% desaturation criteria for hypopneas, per medicare guidelines.   REM pAHI:    N/A       NREM pAHI: 29.7/hour  Central pAHI: 6.8/hour  Oxygen Saturation Statistics:    Oxygen Saturation (%) Mean: 93%   Minimum oxygen saturation (%):                 83%   O2 Saturation Range (%): 83 - 98%    O2 Saturation (minutes) <=88%: 2.1 min  Pulse Rate Statistics:   Pulse Mean (bpm):    53/min    Pulse Range (42 - 77/min)   IMPRESSION: OSA (obstructive sleep apnea)   RECOMMENDATION:  This home sleep test demonstrates moderate obstructive sleep apnea with a total AHI of 29.7/hour and O2 nadir of 83%. Moderate to loud snoring was detected. Ongoing treatment with a positive airway pressure (PAP) device is recommended. The patient should be eligible for a new CPAP machine, which I will prescribe, pressure of 8 cm with EPR of 1. There was a mild central sleep apnea component, probably not of clinical significance. A full night titration study may be considered to optimize treatment settings, monitor proper oxygen saturations and aid  with improvement of tolerance and adherence, if needed down the road. Alternative treatment options may include a dental device through dentistry or orthodontics in selected patients or Inspire (hypoglossal nerve stimulator) in carefully selected patients (meeting inclusion criteria).  Concomitant weight loss is recommended (where clinically appropriate). Please note that untreated obstructive sleep apnea may carry additional perioperative morbidity. Patients with significant obstructive sleep apnea should receive perioperative PAP therapy and the surgeons and particularly the anesthesiologist should be informed of the diagnosis and the severity of the sleep disordered breathing. The patient should be cautioned not to drive, work at heights, or operate dangerous or heavy equipment when tired or sleepy. Review and reiteration of good sleep hygiene measures should be pursued with any patient. Other causes of the patient's symptoms, including circadian rhythm disturbances, an underlying mood disorder, medication effect and/or an underlying medical problem cannot be ruled out based on this test. Clinical correlation is recommended.  The patient and her referring provider will be notified of the test results. The patient will be seen in follow up in sleep clinic at [facility].  I certify that I have reviewed the raw data recording prior to the issuance of this report in accordance with the standards of the American Academy of Sleep Medicine (AASM).  INTERPRETING PHYSICIAN:   Huston Foley, MD, PhD Medical Director, [facility]) Diplomat, ABPN (Neurology and Sleep)   [address] [facility] [address] [place], [place] [address] [phone]

## 2022-07-04 ENCOUNTER — Ambulatory Visit: Payer: Medicare PPO | Admitting: Psychology

## 2022-07-04 ENCOUNTER — Ambulatory Visit (INDEPENDENT_AMBULATORY_CARE_PROVIDER_SITE_OTHER): Payer: Medicare PPO | Admitting: Psychology

## 2022-07-04 ENCOUNTER — Telehealth: Payer: Self-pay | Admitting: Neurology

## 2022-07-04 DIAGNOSIS — F411 Generalized anxiety disorder: Secondary | ICD-10-CM

## 2022-07-04 NOTE — Telephone Encounter (Signed)
I spoke with the patient and discussed her sleep study results. The patient understands her sleep study showed sleep apnea in the moderate range and that Dr Frances Furbish has ordered a new CPAP machine. Patient understands insurance compliance requirements which includes following up here in our office between 30 and 90 days after setup and also using the machine at least 4 hours at night. Patient would like to keep using [facility]. She will watch for a call from them within 1 week and will follow-up if she hasn't heard by then. Her questions were answered. She scheduled her appt for Wed 7/31 at 1:15 pm arrival 12:45 pm.   Order sent to [facility].

## 2022-07-04 NOTE — Telephone Encounter (Signed)
Huston Foley, MD 06/29/2022  4:29 PM EDT Back to Top    Patient referred by PCP for re-eval of her OSA. She should be eligible for a new machine. She was seen by me on 05/22/22, patient had a HST on 06/27/22.     Please call and notify the patient that the recent home sleep test showed obstructive sleep apnea in the moderate range. I will write for a new CPAP machine. She can stay with her DME if she likes or may be able to switch to another DME, if she prefers. I have placed an order in the chart. Please send the order. We will need a FU in sleep clinic for 10 weeks post-PAP set up, please arrange that with me or one of our NPs. Also reinforce the need for compliance with treatment. Thanks,   Huston Foley, MD, PhD [facility])

## 2022-07-04 NOTE — Telephone Encounter (Signed)
Pt states she was responding to a vm from 4-29 re: her sleep study, please call pt.

## 2022-07-04 NOTE — Progress Notes (Signed)
Date: 07/04/2022  Diagnosis G47.00 (Insomnia disorder) [n/a]  300.02 (Generalized anxiety disorder) [n/a]  Symptoms Complains of difficulty remaining asleep. (Status: maintained) -- No Description Entered  Excessive and/or unrealistic worry that is difficult to control occurring more days than not for at least 6 months about a number of events or activities. (Status: maintained) -- No Description Entered  Medication Status compliance  Safety none  If Suicidal or Homicidal State Action Taken: unspecified  Current Risk: low Medications unspecified Objectives Related Problem: Resolve the core conflict that is the source of anxiety. Description: Describe situations, thoughts, feelings, and actions associated with anxieties and worries, their impact on functioning, and attempts to resolve them. Target Date: 2022-08-14 Frequency: Daily Modality: individual Progress: 80%  Related Problem: Resolve the core conflict that is the source of anxiety. Description: Learn and implement calming skills to reduce overall anxiety and manage anxiety symptoms. Target Date: 2022-08-14 Frequency: Daily Modality: individual Progress: 75%  Related Problem: Resolve the core conflict that is the source of anxiety. Description: Learn and implement problem-solving strategies for realistically addressing worries. Target Date: 2022-08-14 Frequency: Daily Modality: individual Progress: 75%  Related Problem: Resolve the core conflict that is the source of anxiety. Description: Maintain involvement in work, family, and social activities. Target Date: 2022-08-14 Frequency: Daily Modality: individual Progress: 70%   Related Problem: Restore restful sleep pattern. Description: Describe the history and details of sleep pattern. Target Date: 2022-08-14 Frequency: Daily Modality: individual Progress: 80%    Client Response full compliance  Service Location Location, [address] [place], [place] [address]  Service Code cpt [phone] P Self-monitoring  Lifestyle change (exercise, nutrition)  Self care activities  Distress tolerance skill  Identify/label emotions  Identify automatic thoughts  Facilitate problem solving  Normalize/Reframe  Behavioral activation plan  Validate/empathize  Comments  Dx.: Generalized Anxiety (F41.1)  Meds: No psychotropics Goals: [name] is seeking to develop a more satisfying life-style that takes into account her current physical condition. Goal date 11-04-20. States that the current situation in the world (politics and Covid 19) is making her very anxious. She is "obsessed" with the news (social media) and remains in a constant state of stress. She wants to learn to be less reactive. Will initiate behaviorally oriented individual therapy to address symptoms. Goal date is 10-04-21. While patient has realized some improvement in level of anxiety, she still expresses considerable stress and anxiety. Continues to need better balance in life. Revised date is 6-24.  Patient agrees to a Film/video editor session. She is at home and I am at my home office.   [name] talked about her sister's  frustrations that inhibit their ability to participate in variou social activities. She feels her sister's stubbornness is a barrier to having fun and a more enjoyable life. [name] said she is going to [facility] [facility] because her sister is offended about being left out and now refuses to attend. She hopes they will apologize so her sister will agree to return. She doesn't know how to address her sister's ongoing health anxieties. Discussed appropriate boundaries and  limitations. Has enmeshed relationship with her sister and needs to accept limitations of her influence. Sleep remains a problem for her but she doesn't want to take any medication for sleep. Suggested on-line apps for sleep.                                                                        `                                                                                                                  Garrel Ridgel, PhD  Time: 11:35a-12:30p 55 minutes

## 2022-07-11 ENCOUNTER — Ambulatory Visit (INDEPENDENT_AMBULATORY_CARE_PROVIDER_SITE_OTHER): Payer: Medicare PPO | Admitting: Psychology

## 2022-07-11 DIAGNOSIS — F411 Generalized anxiety disorder: Secondary | ICD-10-CM

## 2022-07-11 NOTE — Telephone Encounter (Signed)
[facility] confirmed receipt of order. 

## 2022-07-11 NOTE — Progress Notes (Signed)
Date: 07/11/2022  Diagnosis G47.00 (Insomnia disorder) [n/a]  300.02 (Generalized anxiety disorder) [n/a]  Symptoms Complains of difficulty remaining asleep. (Status: maintained) -- No Description Entered  Excessive and/or unrealistic worry that is difficult to control occurring more days than not for at least 6 months about a number of events or activities. (Status: maintained) -- No Description Entered  Medication Status compliance  Safety none  If Suicidal or Homicidal State Action Taken: unspecified  Current Risk: low Medications unspecified Objectives Related Problem: Resolve the core conflict that is the source of anxiety. Description: Describe situations, thoughts, feelings, and actions associated with anxieties and worries, their impact on functioning, and attempts to resolve them. Target Date: 2022-08-14 Frequency: Daily Modality: individual Progress: 80%  Related Problem: Resolve the core conflict that is the source of anxiety. Description: Learn and implement calming skills to reduce overall anxiety and manage anxiety symptoms. Target Date: 2022-08-14 Frequency: Daily Modality: individual Progress: 75%  Related Problem: Resolve the core conflict that is the source of anxiety. Description: Learn and implement problem-solving strategies for realistically addressing worries. Target Date: 2022-08-14 Frequency: Daily Modality: individual Progress: 75%  Related Problem: Resolve the core conflict that is the source of anxiety. Description: Maintain involvement in work, family, and social activities. Target Date: 2022-08-14 Frequency: Daily Modality:  individual Progress: 70%  Related Problem: Restore restful sleep pattern. Description: Describe the history and details of sleep pattern. Target Date: 2022-08-14 Frequency: Daily Modality: individual Progress: 80%    Client Response full compliance  Service Location Location, [address] [place], [place] [address]  Service Code cpt [id number] P Self-monitoring  Lifestyle change (exercise, nutrition)  Self care activities  Distress tolerance skill  Identify/label emotions  Identify automatic thoughts  Facilitate problem solving  Normalize/Reframe  Behavioral activation plan  Validate/empathize  Comments  Dx.: Generalized Anxiety (F41.1)  Meds: No psychotropics Goals: [name] is seeking to develop a more satisfying life-style that takes into account her current physical condition. Goal date 11-04-20. States that the current situation in the world (politics and Covid 19) is making her very anxious. She is "obsessed" with the news (social media) and remains in a constant state of stress. She wants to learn to be less reactive. Will initiate behaviorally oriented individual therapy to address symptoms. Goal date is 10-04-21. While patient has realized some improvement in level of anxiety, she still expresses considerable stress and anxiety. Continues to need better balance in life. Revised date is 6-24.  Patient agrees to a Film/video editor session. She is at  home and I am at my home office.   [name] was upset that the person at [employer] refused to apologize to her sister. [name] says her sister will not go back. She continues to be worried about her sister's physical and emotional health. She is frustrated and not sure how to deal with her sister. [name] is now trying to determine what activities she can do with her sister. Her sister's situation has made her feel more depressed. We talked about her need to take care of herself and not feel complete responsibility for her  sister. She does say she has very little energy. Told her to consult with her doctor.                                                                            `                                                                                                                  Garrel Ridgel, PhD  Time: 11:35a-12:30p 55 minutes

## 2022-07-18 ENCOUNTER — Ambulatory Visit (INDEPENDENT_AMBULATORY_CARE_PROVIDER_SITE_OTHER): Payer: Medicare PPO | Admitting: Psychology

## 2022-07-18 DIAGNOSIS — F411 Generalized anxiety disorder: Secondary | ICD-10-CM

## 2022-07-18 NOTE — Progress Notes (Signed)
Date: 07/18/2022  Diagnosis G47.00 (Insomnia disorder) [n/a]  300.02 (Generalized anxiety disorder) [n/a]  Symptoms Complains of difficulty remaining asleep. (Status: maintained) -- No Description Entered  Excessive and/or unrealistic worry that is difficult to control occurring more days than not for at least 6 months about a number of events or activities. (Status: maintained) -- No Description Entered  Medication Status compliance  Safety none  If Suicidal or Homicidal State Action Taken: unspecified  Current Risk: low Medications unspecified Objectives Related Problem: Resolve the core conflict that is the source of anxiety. Description: Describe situations, thoughts, feelings, and actions associated with anxieties and worries, their impact on functioning, and attempts to resolve them. Target Date: 2022-08-14 Frequency: Daily Modality: individual Progress: 80%  Related Problem: Resolve the core conflict that is the source of anxiety. Description: Learn and implement calming skills to reduce overall anxiety and manage anxiety symptoms. Target Date: 2022-08-14 Frequency: Daily Modality: individual Progress: 75%  Related Problem: Resolve the core conflict that is the source of anxiety. Description: Learn and implement problem-solving strategies for realistically addressing worries. Target Date: 2022-08-14 Frequency: Daily Modality: individual Progress: 75%  Related Problem: Resolve the core conflict that is the source of anxiety. Description: Maintain involvement in work, family, and social activities. Target Date: 2022-08-14 Frequency: Daily Modality: individual Progress: 70%  Related Problem: Restore restful sleep pattern. Description: Describe the history and details of sleep pattern. Target Date: 2022-08-14 Frequency: Daily Modality: individual Progress: 80%    Client Response full compliance  Service Location Location, [address] [place], [place]  [address]  Service Code cpt [phone] P Self-monitoring  Lifestyle change (exercise, nutrition)  Self care activities  Distress tolerance skill  Identify/label emotions  Identify automatic thoughts  Facilitate problem solving  Normalize/Reframe  Behavioral activation plan  Validate/empathize  Comments  Dx.: Generalized Anxiety (F41.1)  Meds: No psychotropics Goals: [name] is seeking to develop a more satisfying life-style that takes into account her current physical condition. Goal date 11-04-20. States that the current situation in the world (politics and Covid 19) is making her very anxious. She is "obsessed" with the news (social media) and remains in a constant state of stress. She wants to learn to be less reactive. Will initiate behaviorally oriented individual therapy to address symptoms. Goal date is 10-04-21. While patient has realized some improvement in level of anxiety, she still expresses considerable stress and anxiety. Continues to need better balance in life. Revised date is 6-24.  Patient agrees to a Film/video editor session. She is at home and I am at my home office.   [name] is finalizing her legal documents for power of attorney. She has been stressing over how to appoint various roles to family members re healthcare. She is not anticipating her own or sister's death any time soon, but is stressed that she does not have all of her plans made. We talked about putting her various worries/anxieties into perspective. She tends to perseverate on items that linger for long periods. Needs to employ her activities that serve as good distractions.                                                                              `  Garrel Ridgel, PhD  Time: 3:15p-4:00p 45 minutes

## 2022-07-21 ENCOUNTER — Emergency Department (HOSPITAL_COMMUNITY): Payer: Medicare PPO

## 2022-07-21 ENCOUNTER — Other Ambulatory Visit: Payer: Self-pay

## 2022-07-21 ENCOUNTER — Observation Stay (HOSPITAL_COMMUNITY)
Admission: EM | Admit: 2022-07-21 | Discharge: 2022-07-23 | Disposition: A | Discharge status: Home / Self Care | Payer: Medicare PPO | Attending: Internal Medicine | Admitting: Internal Medicine

## 2022-07-21 ENCOUNTER — Encounter (HOSPITAL_COMMUNITY): Payer: Self-pay | Admitting: Emergency Medicine

## 2022-07-21 DIAGNOSIS — E039 Hypothyroidism, unspecified: Secondary | ICD-10-CM | POA: Insufficient documentation

## 2022-07-21 DIAGNOSIS — F419 Anxiety disorder, unspecified: Secondary | ICD-10-CM | POA: Diagnosis present

## 2022-07-21 DIAGNOSIS — G4733 Obstructive sleep apnea (adult) (pediatric): Secondary | ICD-10-CM

## 2022-07-21 DIAGNOSIS — I10 Essential (primary) hypertension: Secondary | ICD-10-CM

## 2022-07-21 DIAGNOSIS — I169 Hypertensive crisis, unspecified: Secondary | ICD-10-CM | POA: Diagnosis not present

## 2022-07-21 DIAGNOSIS — R479 Unspecified speech disturbances: Secondary | ICD-10-CM | POA: Diagnosis not present

## 2022-07-21 DIAGNOSIS — I161 Hypertensive emergency: Secondary | ICD-10-CM | POA: Diagnosis present

## 2022-07-21 DIAGNOSIS — Z79899 Other long term (current) drug therapy: Secondary | ICD-10-CM | POA: Diagnosis not present

## 2022-07-21 DIAGNOSIS — E669 Obesity, unspecified: Secondary | ICD-10-CM | POA: Diagnosis present

## 2022-07-21 DIAGNOSIS — E876 Hypokalemia: Secondary | ICD-10-CM | POA: Diagnosis not present

## 2022-07-21 DIAGNOSIS — G9389 Other specified disorders of brain: Secondary | ICD-10-CM | POA: Diagnosis not present

## 2022-07-21 LAB — COMPREHENSIVE METABOLIC PANEL
ALT: 14 U/L (ref 0–44)
AST: 14 U/L — ABNORMAL LOW (ref 15–41)
Albumin: 3.8 g/dL (ref 3.5–5.0)
Alkaline Phosphatase: 48 U/L (ref 38–126)
Anion gap: 9 (ref 5–15)
BUN: 17 mg/dL (ref 8–23)
CO2: 27 mmol/L (ref 22–32)
Calcium: 9.1 mg/dL (ref 8.9–10.3)
Chloride: 102 mmol/L (ref 98–111)
Creatinine, Ser: 0.76 mg/dL (ref 0.44–1.00)
GFR, Estimated: >60 mL/min (ref >60)
Glucose, Bld: 130 mg/dL — ABNORMAL HIGH (ref 70–99)
Potassium: 3.4 mmol/L — ABNORMAL LOW (ref 3.5–5.1)
Sodium: 138 mmol/L (ref 135–145)
Total Bilirubin: 0.5 mg/dL (ref 0.3–1.2)
Total Protein: 6.3 g/dL — ABNORMAL LOW (ref 6.5–8.1)

## 2022-07-21 LAB — CBC
HCT: 40.3 % (ref 36.0–46.0)
Hemoglobin: 13 g/dL (ref 12.0–15.0)
MCH: 26.6 pg (ref 26.0–34.0)
MCHC: 32.3 g/dL (ref 30.0–36.0)
MCV: 82.6 fL (ref 80.0–100.0)
Platelets: 245 10*3/uL (ref 150–400)
RBC: 4.88 MIL/uL (ref 3.87–5.11)
RDW: 13.4 % (ref 11.5–15.5)
WBC: 7.7 10*3/uL (ref 4.0–10.5)
nRBC: 0 % (ref 0.0–0.2)

## 2022-07-21 LAB — RAPID URINE DRUG SCREEN, HOSP PERFORMED
Amphetamines: NOT DETECTED
Barbiturates: NOT DETECTED
Benzodiazepines: NOT DETECTED
Cocaine: NOT DETECTED
Opiates: NOT DETECTED
Tetrahydrocannabinol: NOT DETECTED

## 2022-07-21 LAB — ETHANOL: Alcohol, Ethyl (B): <10 mg/dL (ref <10)

## 2022-07-21 LAB — APTT: aPTT: 29 seconds (ref 24–36)

## 2022-07-21 LAB — PROTIME-INR
INR: 1.1 (ref 0.8–1.2)
Prothrombin Time: 14.3 seconds (ref 11.4–15.2)

## 2022-07-21 LAB — CBG MONITORING, ED: Glucose-Capillary: 104 mg/dL — ABNORMAL HIGH (ref 70–99)

## 2022-07-21 MED ORDER — CLEVIDIPINE BUTYRATE 0.5 MG/ML IV EMUL
0.0000 mg/h | INTRAVENOUS | Status: DC
  Filled 2022-07-21: qty 100

## 2022-07-21 MED ORDER — GADOBUTROL 1 MMOL/ML IV SOLN
8.0000 mL | Freq: Once | INTRAVENOUS | Status: AC | PRN
  Administered 2022-07-22: 8 mL via INTRAVENOUS

## 2022-07-21 MED ORDER — HYDRALAZINE HCL 20 MG/ML IJ SOLN
20.0000 mg | INTRAMUSCULAR | Status: AC
  Administered 2022-07-21: 20 mg via INTRAVENOUS
  Filled 2022-07-21: qty 1

## 2022-07-21 MED ORDER — HYDRALAZINE HCL 20 MG/ML IJ SOLN
10.0000 mg | INTRAMUSCULAR | Status: AC
  Administered 2022-07-21: 10 mg via INTRAVENOUS
  Filled 2022-07-21: qty 1

## 2022-07-21 NOTE — ED Provider Triage Note (Signed)
Emergency Medicine Provider Triage Evaluation Note  [name] , a 77 y.o. female  was evaluated in triage.  Pt complains of elevated blood pressure, balance issues that occurred earlier.  She states she had a similar episode last night.  This particular episode started about 1 hour ago.  She states she was sitting on the sofa when she tilted over.  No vision change.  No difficulty making out words but states that she had a hard time speaking loudly.  Denies any weakness.  Symptoms have resolved..  Review of Systems  Positive: As above Negative: As above  Physical Exam  BP (!) 263/94   Pulse (!) 59   Temp 97.6 F (36.4 C) (Oral)   Resp (!) 22   SpO2 100%  Gen:   Awake, no distress   Resp:  Normal effort  MSK:   Moves extremities without difficulty  Other:    Medical Decision Making  Medically screening exam initiated at 8:16 PM.  Appropriate orders placed.  [name] was informed that the remainder of the evaluation will be completed by another provider, this initial triage assessment does not replace that evaluation, and the importance of remaining in the ED until their evaluation is complete.    Marita Kansas, PA-C 07/21/22 2017

## 2022-07-21 NOTE — ED Triage Notes (Signed)
Pt in from home with hypertension and weakness. Pt states last night she noticed some balance issues, went to bed and woke up normal at 8am, had a regular day. About an hour ago (1915), pt states she started to notice it was hard for her to "speak loudly" - states she looked in a mirror to make sure her face was symmetrical and it was. HTN in triage - 263/94, speech clear, does report anxiety. No unilateral weakness or HA in triage. States hx of HTN and has taken all meds today

## 2022-07-21 NOTE — ED Provider Notes (Signed)
[place] EMERGENCY DEPARTMENT AT [facility] Provider Note   CSN: [id number] Arrival date & time: 07/21/22  2004     History {Add pertinent medical, surgical, social history, OB history to HPI:1} Chief Complaint  Patient presents with   Hypertension   Weakness    [name] is a 77 y.o. female.   Hypertension  Weakness  This patient is a 77 year old female, she has a history of hypertension on an ACE inhibitor with hydrochlorothiazide as well as hydralazine and metoprolol.  She presents to the hospital tonight with a couple of different complaints, first she states that her speech was abnormal and she was having difficulty sitting up straight in a chair, she then noticed that it slightly improved and now feels like she is back to normal.  She did not have a headache and does not have a headache at this time.  Similar symptoms occurred the previous night last night, it seemed to resolve spontaneously as well.  She denied any visual changes, she has no chest pain or shortness of breath, she reports that her blood pressure was severely elevated and on arrival it was 263/94.    Home Medications Prior to Admission medications   Medication Sig Start Date End Date Taking? Authorizing Provider  acetaminophen (TYLENOL) 325 MG tablet Take 325 mg by mouth every 4 (four) hours as needed. Patient not taking: Reported on 05/22/2022    [provider]  Ascorbic Acid (VITAMIN C) 1000 MG tablet Take 1,000 mg by mouth daily. One tablet a day    [provider]  benazepril-hydrochlorthiazide (LOTENSIN HCT) 20-12.5 MG tablet Take 2 tablets by mouth daily. 05/10/20   Just, Azalee Course, FNP  cholecalciferol (VITAMIN D3) 25 MCG (1000 UNIT) tablet Take 1,000 Units by mouth daily.    [provider]  hydrALAZINE (APRESOLINE) 25 MG tablet TAKE 2 TABLETS BY MOUTH EVERY MORNING, THEN 1 TABLET BY MOUTH AT LUNCH AND 2 TABLETS AT [facility] 03/15/20   Just, Azalee Course, FNP   ibuprofen (ADVIL) 400 MG tablet Take 400 mg by mouth 3 (three) times daily. Patient not taking: Reported on 05/22/2022    [provider]  levothyroxine (SYNTHROID) 112 MCG tablet Take 1 tablet (112 mcg total) by mouth daily before breakfast. 07/15/21   Carlus Pavlov, MD  liothyronine (CYTOMEL) 5 MCG tablet Take 1 tablet by mouth before breakfast 07/15/21   Carlus Pavlov, MD  metoprolol succinate (TOPROL-XL) 25 MG 24 hr tablet TAKE 1 TABLET(25 MG) BY MOUTH DAILY 03/15/20   Just, Azalee Course, FNP      Allergies    Bactrim [sulfamethoxazole-trimethoprim], Epinephrine, Erythromycin, and Sulfa antibiotics    Review of Systems   Review of Systems  Neurological:  Positive for weakness.  All other systems reviewed and are negative.   Physical Exam Updated Vital Signs BP (!) 263/94   Pulse (!) 59   Temp 97.6 F (36.4 C) (Oral)   Resp (!) 22   Wt 80.3 kg   SpO2 100%   BMI 34.28 kg/m  Physical Exam Vitals and nursing note reviewed.  Constitutional:      General: She is not in acute distress.    Appearance: She is well-developed.  HENT:     Head: Normocephalic and atraumatic.     Mouth/Throat:     Pharynx: No oropharyngeal exudate.  Eyes:     General: No scleral icterus.       Right eye: No discharge.  Left eye: No discharge.     Conjunctiva/sclera: Conjunctivae normal.     Pupils: Pupils are equal, round, and reactive to light.  Neck:     Thyroid: No thyromegaly.     Vascular: No JVD.  Cardiovascular:     Rate and Rhythm: Normal rate and regular rhythm.     Heart sounds: Normal heart sounds. No murmur heard.    No friction rub. No gallop.  Pulmonary:     Effort: Pulmonary effort is normal. No respiratory distress.     Breath sounds: Normal breath sounds. No wheezing or rales.  Abdominal:     General: Bowel sounds are normal. There is no distension.     Palpations: Abdomen is soft. There is no mass.     Tenderness: There is no abdominal tenderness.   Musculoskeletal:        General: No tenderness. Normal range of motion.     Cervical back: Normal range of motion and neck supple.     Right lower leg: Edema present.     Left lower leg: Edema present.     Comments: Symmetrical 1+ pitting edema of the legs  Lymphadenopathy:     Cervical: No cervical adenopathy.  Skin:    General: Skin is warm and dry.     Findings: No erythema or rash.  Neurological:     Mental Status: She is alert.     Coordination: Coordination normal.     Comments: Speech is clear, cranial nerves III through XII are intact, memory is intact, strength is normal in all 4 extremities including grips, sensation is intact to light touch and pinprick in all 4 extremities. Coordination as tested by finger-nose-finger is normal, no limb ataxia. Normal gait, normal reflexes at the patellar tendons bilaterally  Psychiatric:        Behavior: Behavior normal.     ED Results / Procedures / Treatments   Labs (all labs ordered are listed, but only abnormal results are displayed) Labs Reviewed  COMPREHENSIVE METABOLIC PANEL - Abnormal; Notable for the following components:      Result Value   Potassium 3.4 (*)    Glucose, Bld 130 (*)    Total Protein 6.3 (*)    AST 14 (*)    All other components within normal limits  CBC  PROTIME-INR  APTT  RAPID URINE DRUG SCREEN, HOSP PERFORMED  CBG MONITORING, ED    EKG EKG Interpretation  Date/Time:  Friday Jul 21 2022 19:58:16 EDT Ventricular Rate:  59 PR Interval:  164 QRS Duration: 78 QT Interval:  440 QTC Calculation: 435 R Axis:   58 Text Interpretation: Sinus bradycardia Otherwise normal ECG When compared with ECG of 31-Jul-2017 20:30, PREVIOUS ECG IS PRESENT Confirmed by Eber Hong ([id number]) on 07/21/2022 9:14:50 PM  Radiology CT HEAD WO CONTRAST  Result Date: 07/21/2022 CLINICAL DATA:  Insert hip history EXAM: CT HEAD WITHOUT CONTRAST TECHNIQUE: Contiguous axial images were obtained from the base of the skull  through the vertex without intravenous contrast. RADIATION DOSE REDUCTION: This exam was performed according to the departmental dose-optimization program which includes automated exposure control, adjustment of the mA and/or kV according to patient size and/or use of iterative reconstruction technique. COMPARISON:  03/16/2016 FINDINGS: Brain: Normal anatomic configuration. Parenchymal volume loss is commensurate with the patient's age with stable slightly asymmetric right hemispheric cortical atrophy. Mild, stable periventricular white matter changes are present likely reflecting the sequela of small vessel ischemia. 20 mm partially calcified slightly hyperdense extra-axial mass is  seen within the right middle cranial fossa abutting the anterior pole of right temporal lobe at axial image # 11/3 increased in size since prior examination, possibly representing a enlarging meningioma. No abnormal intra or extra-axial fluid collection. No abnormal mass effect or midline shift. No evidence of acute intracranial hemorrhage or infarct. Ventricular size is normal. Cerebellum unremarkable. Vascular: No asymmetric hyperdense vasculature at the skull base. Skull: Intact Sinuses/Orbits: Paranasal sinuses are clear. Orbits are unremarkable. Other: Mastoid air cells and middle ear cavities are clear. IMPRESSION: 1. No acute intracranial abnormality. No calvarial fracture. 2. Interval increase in size of a partially calcified slightly hyperdense extra-axial mass within the right middle cranial fossa abutting the anterior pole of the right temporal lobe, possibly representing a enlarging meningioma. This could be confirmed with contrast enhanced MRI examination. 3. Stable senescent changes. Electronically Signed   By: Helyn Numbers M.D.   On: 07/21/2022 21:02    Procedures Procedures  {Document cardiac monitor, telemetry assessment procedure when appropriate:1}  Medications Ordered in ED Medications - No data to  display  ED Course/ Medical Decision Making/ A&P   {   Click here for ABCD2, HEART and other calculatorsREFRESH Note before signing :1}                          Medical Decision Making   This patient presents to the ED for concern of abnormal neurologic symptoms with associated severe hypertension, this involves an extensive number of treatment options, and is a complaint that carries with it a high risk of complications and morbidity.  The differential diagnosis includes medication noncompliance, severe hypertension, press syndrome, encephalopathy, TIA, stroke, hemorrhage   Co morbidities that complicate the patient evaluation  Severe hypertension   Additional history obtained:  Additional history obtained from medical record External records from outside source obtained and reviewed including multiple visits to her counselor for anxiety disorder, she has been seen by cardiology for a heart murmur as recently as February of last year, no recent admissions to the hospital   Lab Tests:  I Ordered, and personally interpreted labs.  The pertinent results include:  ***   Imaging Studies ordered:  I ordered imaging studies including ***  I independently visualized and interpreted imaging which showed *** I agree with the radiologist interpretation   Cardiac Monitoring: / EKG:  The patient was maintained on a cardiac monitor.  I personally viewed and interpreted the cardiac monitored which showed an underlying rhythm of: ***   Consultations Obtained:  I requested consultation with the ***,  and discussed lab and imaging findings as well as pertinent plan - they recommend: ***   Problem List / ED Course / Critical interventions / Medication management  *** I ordered medication including ***  for ***  Reevaluation of the patient after these medicines showed that the patient {resolved/improved/worsened:23923::"improved"} I have reviewed the patients home medicines and have  made adjustments as needed   Social Determinants of Health:  ***   Test / Admission - Considered:  ***   {Document critical care time when appropriate:1} {Document review of labs and clinical decision tools ie heart score, Chads2Vasc2 etc:1}  {Document your independent review of radiology images, and any outside records:1} {Document your discussion with family members, caretakers, and with consultants:1} {Document social determinants of health affecting pt's care:1} {Document your decision making why or why not admission, treatments were needed:1} Final Clinical Impression(s) / ED Diagnoses Final diagnoses:  None  Rx / DC Orders ED Discharge Orders     None

## 2022-07-22 ENCOUNTER — Encounter (HOSPITAL_COMMUNITY): Payer: Self-pay | Admitting: Family Medicine

## 2022-07-22 DIAGNOSIS — I169 Hypertensive crisis, unspecified: Secondary | ICD-10-CM | POA: Diagnosis not present

## 2022-07-22 DIAGNOSIS — F419 Anxiety disorder, unspecified: Secondary | ICD-10-CM | POA: Diagnosis not present

## 2022-07-22 DIAGNOSIS — I161 Hypertensive emergency: Secondary | ICD-10-CM | POA: Diagnosis present

## 2022-07-22 DIAGNOSIS — I10 Essential (primary) hypertension: Secondary | ICD-10-CM | POA: Diagnosis not present

## 2022-07-22 DIAGNOSIS — D320 Benign neoplasm of cerebral meninges: Secondary | ICD-10-CM | POA: Diagnosis not present

## 2022-07-22 DIAGNOSIS — E669 Obesity, unspecified: Secondary | ICD-10-CM | POA: Diagnosis present

## 2022-07-22 DIAGNOSIS — E876 Hypokalemia: Secondary | ICD-10-CM | POA: Diagnosis present

## 2022-07-22 DIAGNOSIS — E039 Hypothyroidism, unspecified: Secondary | ICD-10-CM | POA: Diagnosis not present

## 2022-07-22 LAB — BRAIN NATRIURETIC PEPTIDE: B Natriuretic Peptide: 200.9 pg/mL — ABNORMAL HIGH (ref 0.0–100.0)

## 2022-07-22 LAB — BASIC METABOLIC PANEL
Anion gap: 10 (ref 5–15)
BUN: 11 mg/dL (ref 8–23)
CO2: 26 mmol/L (ref 22–32)
Calcium: 8.9 mg/dL (ref 8.9–10.3)
Chloride: 104 mmol/L (ref 98–111)
Creatinine, Ser: 0.64 mg/dL (ref 0.44–1.00)
GFR, Estimated: >60 mL/min (ref >60)
Glucose, Bld: 105 mg/dL — ABNORMAL HIGH (ref 70–99)
Potassium: 3.2 mmol/L — ABNORMAL LOW (ref 3.5–5.1)
Sodium: 140 mmol/L (ref 135–145)

## 2022-07-22 LAB — CBC
HCT: 40.1 % (ref 36.0–46.0)
Hemoglobin: 13.2 g/dL (ref 12.0–15.0)
MCH: 27.3 pg (ref 26.0–34.0)
MCHC: 32.9 g/dL (ref 30.0–36.0)
MCV: 82.9 fL (ref 80.0–100.0)
Platelets: 239 10*3/uL (ref 150–400)
RBC: 4.84 MIL/uL (ref 3.87–5.11)
RDW: 13.4 % (ref 11.5–15.5)
WBC: 8.7 10*3/uL (ref 4.0–10.5)
nRBC: 0 % (ref 0.0–0.2)

## 2022-07-22 LAB — MAGNESIUM: Magnesium: 2.1 mg/dL (ref 1.7–2.4)

## 2022-07-22 MED ORDER — HYDRALAZINE HCL 25 MG PO TABS: 50.0000 mg | ORAL_TABLET | Freq: Two times a day (BID) | ORAL | Status: DC

## 2022-07-22 MED ORDER — FUROSEMIDE 10 MG/ML IJ SOLN
20.0000 mg | Freq: Once | INTRAMUSCULAR | Status: AC
  Administered 2022-07-22: 20 mg via INTRAVENOUS
  Filled 2022-07-22: qty 2

## 2022-07-22 MED ORDER — BENAZEPRIL-HYDROCHLOROTHIAZIDE 20-12.5 MG PO TABS: 2.0000 | ORAL_TABLET | Freq: Every day | ORAL | Status: DC

## 2022-07-22 MED ORDER — LIOTHYRONINE SODIUM 5 MCG PO TABS
5.0000 ug | ORAL_TABLET | Freq: Every day | ORAL | Status: DC
  Administered 2022-07-22 – 2022-07-23 (×2): 5 ug via ORAL
  Filled 2022-07-22 (×2): qty 1

## 2022-07-22 MED ORDER — ACETAMINOPHEN 650 MG RE SUPP: 650.0000 mg | Freq: Four times a day (QID) | RECTAL | Status: DC | PRN

## 2022-07-22 MED ORDER — HYDRALAZINE HCL 20 MG/ML IJ SOLN
20.0000 mg | INTRAMUSCULAR | Status: DC | PRN
  Administered 2022-07-22: 20 mg via INTRAVENOUS
  Filled 2022-07-22: qty 1

## 2022-07-22 MED ORDER — HYDRALAZINE HCL 20 MG/ML IJ SOLN
10.0000 mg | Freq: Four times a day (QID) | INTRAMUSCULAR | Status: DC | PRN
  Filled 2022-07-22: qty 1

## 2022-07-22 MED ORDER — METOPROLOL SUCCINATE ER 25 MG PO TB24
25.0000 mg | ORAL_TABLET | Freq: Every day | ORAL | Status: DC
  Administered 2022-07-22 – 2022-07-23 (×2): 25 mg via ORAL
  Filled 2022-07-22 (×2): qty 1

## 2022-07-22 MED ORDER — BENAZEPRIL HCL 20 MG PO TABS
20.0000 mg | ORAL_TABLET | Freq: Every day | ORAL | Status: DC
  Administered 2022-07-22 – 2022-07-23 (×2): 20 mg via ORAL
  Filled 2022-07-22 (×2): qty 1

## 2022-07-22 MED ORDER — ONDANSETRON HCL 4 MG/2ML IJ SOLN: 4.0000 mg | Freq: Four times a day (QID) | INTRAMUSCULAR | Status: DC | PRN

## 2022-07-22 MED ORDER — ACETAMINOPHEN 325 MG PO TABS: 650.0000 mg | ORAL_TABLET | Freq: Four times a day (QID) | ORAL | Status: DC | PRN

## 2022-07-22 MED ORDER — POTASSIUM CHLORIDE CRYS ER 20 MEQ PO TBCR
40.0000 meq | EXTENDED_RELEASE_TABLET | Freq: Once | ORAL | Status: AC
  Administered 2022-07-22: 40 meq via ORAL
  Filled 2022-07-22: qty 2

## 2022-07-22 MED ORDER — LEVOTHYROXINE SODIUM 112 MCG PO TABS
112.0000 ug | ORAL_TABLET | Freq: Every day | ORAL | Status: DC
  Administered 2022-07-22 – 2022-07-23 (×2): 112 ug via ORAL
  Filled 2022-07-22 (×2): qty 1

## 2022-07-22 MED ORDER — HYDROCODONE-ACETAMINOPHEN 5-325 MG PO TABS: 1.0000 | ORAL_TABLET | ORAL | Status: DC | PRN

## 2022-07-22 MED ORDER — ENOXAPARIN SODIUM 40 MG/0.4ML IJ SOSY
40.0000 mg | PREFILLED_SYRINGE | INTRAMUSCULAR | Status: DC
  Filled 2022-07-22 (×2): qty 0.4

## 2022-07-22 MED ORDER — HYDRALAZINE HCL 25 MG PO TABS: 25.0000 mg | ORAL_TABLET | ORAL | Status: DC

## 2022-07-22 MED ORDER — HYDROCHLOROTHIAZIDE 12.5 MG PO TABS
12.5000 mg | ORAL_TABLET | Freq: Every day | ORAL | Status: DC
  Administered 2022-07-22 – 2022-07-23 (×2): 12.5 mg via ORAL
  Filled 2022-07-22 (×2): qty 1

## 2022-07-22 MED ORDER — POLYETHYLENE GLYCOL 3350 17 G PO PACK: 17.0000 g | PACK | Freq: Every day | ORAL | Status: DC | PRN

## 2022-07-22 MED ORDER — ONDANSETRON HCL 4 MG PO TABS: 4.0000 mg | ORAL_TABLET | Freq: Four times a day (QID) | ORAL | Status: DC | PRN

## 2022-07-22 MED ORDER — HYDRALAZINE HCL 50 MG PO TABS
50.0000 mg | ORAL_TABLET | Freq: Three times a day (TID) | ORAL | Status: DC
  Administered 2022-07-22 – 2022-07-23 (×5): 50 mg via ORAL
  Filled 2022-07-22 (×2): qty 2
  Filled 2022-07-22 (×3): qty 1

## 2022-07-22 NOTE — ED Notes (Signed)
Walked patient to the bathroom patient did well 

## 2022-07-22 NOTE — Progress Notes (Signed)
Set patient up with CPAP and nasal mask. Patient trie it for a few minutes and said she did not like how the mask fit and how noisy the machine was. She wished to go without the CPAP tonight. I told her she is free to bring her machine and/or mask for tomorrow night and she said she hopes to go home tomorrow so she will see how things go.  [name]

## 2022-07-22 NOTE — Progress Notes (Signed)
[facility]                                                                              [name], is a 77 y.o. female, DOB - 1945/08/31, QIO:[id number] Admit date - 07/21/2022    Outpatient Primary MD for the patient is [name], MD  LOS - 0  days  Chief Complaint  Patient presents with   Hypertension   Weakness       Brief summary   Patient is a 77 year old female with HTN, hypothyroidism, anxiety, OSA on CPAP, presented to ED with difficulty with balance and speech.  Patient reported that she was having difficulty with her balance a day before the admission, noted that she kept falling to the side.  Also had trouble speaking to her sister, resolved after several minutes.  Denied any headache, focal weakness or numbness.  No vision changes.  Reported adherence with her blood pressure medications.  She also noticed increased bilateral lower extremity edema recently In ED, temp 97.6, pulse 59, RR 22, BP 263/94 in triage, O2 sats 97 to 100% on room air MRI brain showed no acute intracranial abnormality, 2.3 cm meningioma in the right middle cranial fossa, 2 additional 1 cm meningiomas overlying the right frontal convexity, no associated edema or mass effect, otherwise normal brain MRI for age   Assessment & Plan    Principal Problem:   Hypertensive emergency -Presented with BP of 263/94 with transient focal neurological deficits including gait ataxia and slurred speech.  Symptoms now resolved. -MRI brain negative for acute stroke. -BP improved with several doses of IV hydralazine, also received Lasix 20 mg IV x 1 -Resumed on her oral hypertensives benazepril HCTZ, Toprol-XL 25 mg daily -Increased hydralazine to 50 mg 3 times daily -Obtain 2D echo given elevated BNP and lower extremity edema  Active Problems:   Anxiety -Per patient, her symptoms could have precipitated due to acute anxiety -However at this time, declined any as needed anxiety  medications.      Hypothyroidism -Continue Synthroid -TSH 1.1 on 07/15/2021    Obstructive sleep apnea treated with (CPAP) -Continue CPAP    Hypokalemia -Replaced    Obesity (BMI 30-39.9) Estimated body mass index is 34.28 kg/m as calculated from the following:   Height as of 05/22/22: 5' 0.25" (1.53 m).   Weight as of this encounter: 80.3 kg.  Code Status: Full code DVT Prophylaxis:  enoxaparin (LOVENOX) injection 40 mg Start: 07/22/22 1000   Level of Care: Level of care: Telemetry Cardiac Family Communication: Updated patient' and sister at the bedside Disposition Plan:      Remains inpatient appropriate: Pending 2D echo BP improving however has received several doses of IV hydralazine overnight and Lasix this morning.  Will monitor with next 24 hours and hopefully DC home tomorrow   Procedures:  MRI brain  Consultants:   Neurology was consulted in ED  Antimicrobials: None    Medications  benazepril  20 mg Oral Daily   And   hydrochlorothiazide  12.5 mg Oral Daily   enoxaparin (LOVENOX) injection  40 mg Subcutaneous Q24H   hydrALAZINE  50 mg Oral  TID WC   levothyroxine  112 mcg Oral QAC breakfast   liothyronine  5 mcg Oral Daily   metoprolol succinate  25 mg Oral Daily      Subjective:   Ragad Rothrock was seen and examined today.  Feeling better, no headache, blurry vision, numbness or tingling.  Appropriately talking, no slurred speech noted.  Noted her ambulating in the hallway to go to the restroom this morning before my encounter.  No gait instability noted.  No chest pain, shortness of breath. Objective:   Vitals:   07/22/22 0800 07/22/22 0830 07/22/22 0900 07/22/22 0930  BP: (!) 166/79 (!) 120/56 (!) 139/54 (!) 139/58  Pulse: 69 64 (!) 59 (!) 59  Resp:  17 17   Temp:      TempSrc:      SpO2: 98% 99% 98% 96%  Weight:       No intake or output data in the 24 hours ending 07/22/22 1031   Wt Readings from Last 3 Encounters:  07/21/22 80.3 kg   05/22/22 83.5 kg  07/15/21 84.2 kg     Exam General: Alert and oriented x 3, NAD Cardiovascular: S1 S2 auscultated,  RRR Respiratory: Clear to auscultation bilaterally Gastrointestinal: Soft, nontender, nondistended, + bowel sounds Ext: no pedal edema bilaterally Neuro: Strength 5/5 upper and lower extremities bilaterally Psych: Normal affect     Data Reviewed:  I have personally reviewed following labs    CBC Lab Results  Component Value Date   WBC 8.7 07/22/2022   RBC 4.84 07/22/2022   HGB 13.2 07/22/2022   HCT 40.1 07/22/2022   MCV 82.9 07/22/2022   MCH 27.3 07/22/2022   PLT 239 07/22/2022   MCHC 32.9 07/22/2022   RDW 13.4 07/22/2022   LYMPHSABS 2.4 09/01/2016   MONOABS 0.4 03/16/2016   EOSABS 0.2 09/01/2016   BASOSABS 0.0 09/01/2016     Last metabolic panel Lab Results  Component Value Date   NA 140 07/22/2022   K 3.2 (L) 07/22/2022   CL 104 07/22/2022   CO2 26 07/22/2022   BUN 11 07/22/2022   CREATININE 0.64 07/22/2022   GLUCOSE 105 (H) 07/22/2022   GFRNONAA >60 07/22/2022   GFRAA 99 06/23/2019   CALCIUM 8.9 07/22/2022   PROT 6.3 (L) 07/21/2022   ALBUMIN 3.8 07/21/2022   LABGLOB 2.6 06/23/2019   AGRATIO 1.7 06/23/2019   BILITOT 0.5 07/21/2022   ALKPHOS 48 07/21/2022   AST 14 (L) 07/21/2022   ALT 14 07/21/2022   ANIONGAP 10 07/22/2022    CBG (last 3)  Recent Labs    07/21/22 2211  GLUCAP 104*      Coagulation Profile: Recent Labs  Lab 07/21/22 2023  INR 1.1     Radiology Studies: I have personally reviewed the imaging studies  MR BRAIN W WO CONTRAST  Result Date: 07/22/2022 CLINICAL DATA:  Initial evaluation for neuro deficit, stroke. EXAM: MRI HEAD WITHOUT AND WITH CONTRAST TECHNIQUE: Multiplanar, multiecho pulse sequences of the brain and surrounding structures were obtained without and with intravenous contrast. CONTRAST:  8mL GADAVIST GADOBUTROL 1 MMOL/ML IV SOLN COMPARISON:  CT from 07/21/2022. FINDINGS: Brain: Cerebral  volume within normal limits. No significant cerebral white matter disease for age. No evidence for acute or subacute ischemia. No areas of chronic cortical infarction. No acute or chronic intracranial blood products. 2.3 cm meningioma seen at the right middle cranial fossa, overlying the anterior right temporal pole (series 18, image 25). Additional extra-axial foci of enhancement seen superiorly overlying  the right frontal convexity measure up to 1 cm (series 18, images 43, 45, also likely small meningiomas. No associated edema or mass effect. No other mass lesion or abnormal enhancement. No midline shift or hydrocephalus. No extra-axial fluid collection. Pituitary gland suprasellar region within normal limits. Vascular: Major intracranial vascular flow voids are maintained. Skull and upper cervical spine: Craniocervical junction within normal limits. Bone marrow signal intensity normal. No concerning focal marrow replacing lesion. No scalp soft tissue abnormality. Sinuses/Orbits: Globes orbital soft tissues within normal limits. Mild right sphenoid sinus disease. Paranasal sinuses are otherwise largely clear. No significant mastoid effusion. Other: None. IMPRESSION: 1. No acute intracranial abnormality. 2. 2.3 cm meningioma at the right middle cranial fossa. Two additional 1 cm meningiomas overlying the right frontal convexity as above. No associated edema or mass effect. 3. Otherwise normal brain MRI for age. Electronically Signed   By: Rise Mu M.D.   On: 07/22/2022 01:04   CT HEAD WO CONTRAST  Result Date: 07/21/2022 CLINICAL DATA:  Insert hip history EXAM: CT HEAD WITHOUT CONTRAST TECHNIQUE: Contiguous axial images were obtained from the base of the skull through the vertex without intravenous contrast. RADIATION DOSE REDUCTION: This exam was performed according to the departmental dose-optimization program which includes automated exposure control, adjustment of the mA and/or kV according to  patient size and/or use of iterative reconstruction technique. COMPARISON:  03/16/2016 FINDINGS: Brain: Normal anatomic configuration. Parenchymal volume loss is commensurate with the patient's age with stable slightly asymmetric right hemispheric cortical atrophy. Mild, stable periventricular white matter changes are present likely reflecting the sequela of small vessel ischemia. 20 mm partially calcified slightly hyperdense extra-axial mass is seen within the right middle cranial fossa abutting the anterior pole of right temporal lobe at axial image # 11/3 increased in size since prior examination, possibly representing a enlarging meningioma. No abnormal intra or extra-axial fluid collection. No abnormal mass effect or midline shift. No evidence of acute intracranial hemorrhage or infarct. Ventricular size is normal. Cerebellum unremarkable. Vascular: No asymmetric hyperdense vasculature at the skull base. Skull: Intact Sinuses/Orbits: Paranasal sinuses are clear. Orbits are unremarkable. Other: Mastoid air cells and middle ear cavities are clear. IMPRESSION: 1. No acute intracranial abnormality. No calvarial fracture. 2. Interval increase in size of a partially calcified slightly hyperdense extra-axial mass within the right middle cranial fossa abutting the anterior pole of the right temporal lobe, possibly representing a enlarging meningioma. This could be confirmed with contrast enhanced MRI examination. 3. Stable senescent changes. Electronically Signed   By: Helyn Numbers M.D.   On: 07/21/2022 21:02       Dastan Krider M.D. [facility] 07/22/2022, 10:31 AM  Available via Epic secure chat 7am-7pm After 7 pm, please refer to night coverage provider listed on amion.

## 2022-07-22 NOTE — ED Notes (Addendum)
Provided pt's family member at bedside the number to call the kitchen to order her some food. Family voicing concerns about where the chair is at in the room near the door, not having a menu to order food, etc. Explained that she can call the kitchen and tell them what she would like and that she is in purple [address]. Also explained that she is welcome to move the chair wherever she would feel more comfortable in the room. Pt then asked if people are going to swing open the door really quickly and I explained that staff try to not open the doors quickly.

## 2022-07-22 NOTE — ED Notes (Signed)
Called 6E to notify of pt departure from ED.

## 2022-07-22 NOTE — H&P (Signed)
History and Physical    [name] JWJ:[id number] DOB: 1945-08-10 DOA: 07/21/2022  PCP: [name], MD   Patient coming from: Home   Chief Complaint: Balance difficulty, speech difficulty   HPI: [name] is a 77 y.o. female with medical history significant for hypertension, hypothyroidism, anxiety, OSA on CPAP, and BMI 34 who presents to the emergency department with balance and speech difficulty.  Patient reports that she was having difficulty with her balance yesterday evening, noting that she kept falling to the side.  She also was having trouble speaking to her sister, stating that she felt as though she cannot open her mouth to speak.  This resolved after several minutes.  She denies headache or focal numbness or weakness.  She denies vision change.  She reports adherence with her blood pressure medications, noting that she took her nighttime dose of hydralazine just prior to coming into the hospital.  She has noticed increased bilateral lower extremity edema recently.  ED Course: Upon arrival to the ED, patient is found to be afebrile and saturating well on room air with low heart rate and severely elevated blood pressure.  EKG demonstrates sinus bradycardia.  MRI brain was negative for acute intracranial abnormality.  Chemistry panel is notable for potassium 3.4.  ED physician discussed the case with on-call neurologist to suspects that the symptoms are due to the severely elevated blood pressure and recommended SBP goal of 160.  Patient was given 2 doses of IV hydralazine in the ED.  Review of Systems:  All other systems reviewed and apart from HPI, are negative.  Past Medical History:  Diagnosis Date   Abnormal perimenopausal bleeding 03/07/2003   Allergy    Anemia    Anxiety    Arthritis    Dysuria 03/06/2002   Endometrial polyp 03/06/2004   Environmental allergies    Fibroid 02/19/2003   H/O varicella    History of measles, mumps, or rubella    Hx of  colonic polyps 12/15/2010   Hypertension 07/19/2010   echo for pre-op EF 55%  normal left wall thickness LA mildly dialated with mitral and tricuspid regurgatation   Hypothyroidism    Interstitial cystitis    Menopausal symptoms 03/06/2002   Obesity    Osteoarthritis    of knees   Osteopenia 02/04/2012   -1.8 T score right femur neck   PMB (postmenopausal bleeding) 06/21/2010   Sleep apnea    Urinary incontinence 03/06/2009   Vitamin D deficiency    history of    Past Surgical History:  Procedure Laterality Date   COLONOSCOPY  12/15/10   HYSTEROSCOPY  2006 and May 2012   for fibroids, endometrial polyps   MOUTH SURGERY     MYOMECTOMY  1974    Social History:   reports that she has never smoked. She has never used smokeless tobacco. She reports current alcohol use. She reports that she does not use drugs.  Allergies  Allergen Reactions   Bactrim [Sulfamethoxazole-Trimethoprim]    Epinephrine     shakey   Erythromycin    Sulfa Antibiotics Rash    Family History  Problem Relation Age of Onset   Kidney disease Father        from uremia   Benign prostatic hyperplasia Father    Hypertension Mother    Colon cancer Paternal Uncle    Clotting disorder Maternal Grandmother        ????     Prior to Admission medications   Medication Sig  Start Date End Date Taking? Authorizing Provider  acetaminophen (TYLENOL) 325 MG tablet Take 325 mg by mouth every 4 (four) hours as needed. Patient not taking: Reported on 05/22/2022    [provider]  Ascorbic Acid (VITAMIN C) 1000 MG tablet Take 1,000 mg by mouth daily. One tablet a day    [provider]  benazepril-hydrochlorthiazide (LOTENSIN HCT) 20-12.5 MG tablet Take 2 tablets by mouth daily. 05/10/20   Just, Azalee Course, FNP  cholecalciferol (VITAMIN D3) 25 MCG (1000 UNIT) tablet Take 1,000 Units by mouth daily.    [provider]  hydrALAZINE (APRESOLINE) 25 MG tablet TAKE 2 TABLETS BY MOUTH EVERY  MORNING, THEN 1 TABLET BY MOUTH AT LUNCH AND 2 TABLETS AT [facility] 03/15/20   Just, Azalee Course, FNP  ibuprofen (ADVIL) 400 MG tablet Take 400 mg by mouth 3 (three) times daily. Patient not taking: Reported on 05/22/2022    [provider]  levothyroxine (SYNTHROID) 112 MCG tablet Take 1 tablet (112 mcg total) by mouth daily before breakfast. 07/15/21   Carlus Pavlov, MD  liothyronine (CYTOMEL) 5 MCG tablet Take 1 tablet by mouth before breakfast 07/15/21   Carlus Pavlov, MD  metoprolol succinate (TOPROL-XL) 25 MG 24 hr tablet TAKE 1 TABLET(25 MG) BY MOUTH DAILY 03/15/20   Just, Azalee Course, FNP    Physical Exam: Vitals:   07/21/22 2020 07/21/22 2222 07/21/22 2231 07/21/22 2310  BP:  (!) 218/69 (!) 199/70 (!) 167/74  Pulse:   (!) 51 (!) 55  Resp:   13 12  Temp:   (!) 97.5 F (36.4 C)   TempSrc:   Oral   SpO2:   100% 100%  Weight: 80.3 kg       Constitutional: NAD, no pallor or diaphoresis   Eyes: PERTLA, lids and conjunctivae normal ENMT: Mucous membranes are moist. Posterior pharynx clear of any exudate or lesions.   Neck: supple, no masses  Respiratory: no wheezing, no crackles. No accessory muscle use.  Cardiovascular: S1 & S2 heard, regular rate and rhythm. Pretibial pitting edema bilaterally.  Abdomen: No distension, no tenderness, soft. Bowel sounds active.  Musculoskeletal: no clubbing / cyanosis. No joint deformity upper and lower extremities.   Skin: no significant rashes, lesions, ulcers. Warm, dry, well-perfused. Neurologic: CN 2-12 grossly intact. Sensation intact. Strength 5/5 in all 4 limbs. Alert and oriented.  Psychiatric: Calm. Cooperative.    Labs and Imaging on Admission: I have personally reviewed following labs and imaging studies  CBC: Recent Labs  Lab 07/21/22 2023  WBC 7.7  HGB 13.0  HCT 40.3  MCV 82.6  PLT 245   Basic Metabolic Panel: Recent Labs  Lab 07/21/22 2023  NA 138  K 3.4*  CL 102  CO2 27  GLUCOSE 130*  BUN 17  CREATININE  0.76  CALCIUM 9.1   GFR: Estimated Creatinine Clearance: 55.6 mL/min (by C-G formula based on SCr of 0.76 mg/dL). Liver Function Tests: Recent Labs  Lab 07/21/22 2023  AST 14*  ALT 14  ALKPHOS 48  BILITOT 0.5  PROT 6.3*  ALBUMIN 3.8   No results for input(s): "LIPASE", "AMYLASE" in the last 168 hours. No results for input(s): "AMMONIA" in the last 168 hours. Coagulation Profile: Recent Labs  Lab 07/21/22 2023  INR 1.1   Cardiac Enzymes: No results for input(s): "CKTOTAL", "CKMB", "CKMBINDEX", "TROPONINI" in the last 168 hours. BNP (last 3 results) No results for input(s): "PROBNP" in the last 8760 hours. HbA1C: No results for input(s): "HGBA1C" in  the last 72 hours. CBG: Recent Labs  Lab 07/21/22 2211  GLUCAP 104*   Lipid Profile: No results for input(s): "CHOL", "HDL", "LDLCALC", "TRIG", "CHOLHDL", "LDLDIRECT" in the last 72 hours. Thyroid Function Tests: No results for input(s): "TSH", "T4TOTAL", "FREET4", "T3FREE", "THYROIDAB" in the last 72 hours. Anemia Panel: No results for input(s): "VITAMINB12", "FOLATE", "FERRITIN", "TIBC", "IRON", "RETICCTPCT" in the last 72 hours. Urine analysis:    Component Value Date/Time   COLORURINE YELLOW 07/31/2017 2032   APPEARANCEUR Clear 06/23/2019 1529   LABSPEC 1.009 07/31/2017 2032   PHURINE 7.0 07/31/2017 2032   GLUCOSEU Negative 06/23/2019 1529   HGBUR NEGATIVE 07/31/2017 2032   BILIRUBINUR Negative 06/23/2019 1529   KETONESUR NEGATIVE 07/31/2017 2032   PROTEINUR Negative 06/23/2019 1529   PROTEINUR NEGATIVE 07/31/2017 2032   UROBILINOGEN 0.2 09/01/2016 0945   UROBILINOGEN 0.2 08/01/2010 1545   NITRITE Negative 06/23/2019 1529   NITRITE NEGATIVE 07/31/2017 2032   LEUKOCYTESUR Negative 06/23/2019 1529   Sepsis Labs: @LABRCNTIP (procalcitonin:4,lacticidven:4) )No results found for this or any previous visit (from the past 240 hour(s)).   Radiological Exams on Admission: MR BRAIN W WO CONTRAST  Result Date:  07/22/2022 CLINICAL DATA:  Initial evaluation for neuro deficit, stroke. EXAM: MRI HEAD WITHOUT AND WITH CONTRAST TECHNIQUE: Multiplanar, multiecho pulse sequences of the brain and surrounding structures were obtained without and with intravenous contrast. CONTRAST:  8mL GADAVIST GADOBUTROL 1 MMOL/ML IV SOLN COMPARISON:  CT from 07/21/2022. FINDINGS: Brain: Cerebral volume within normal limits. No significant cerebral white matter disease for age. No evidence for acute or subacute ischemia. No areas of chronic cortical infarction. No acute or chronic intracranial blood products. 2.3 cm meningioma seen at the right middle cranial fossa, overlying the anterior right temporal pole (series 18, image 25). Additional extra-axial foci of enhancement seen superiorly overlying the right frontal convexity measure up to 1 cm (series 18, images 43, 45, also likely small meningiomas. No associated edema or mass effect. No other mass lesion or abnormal enhancement. No midline shift or hydrocephalus. No extra-axial fluid collection. Pituitary gland suprasellar region within normal limits. Vascular: Major intracranial vascular flow voids are maintained. Skull and upper cervical spine: Craniocervical junction within normal limits. Bone marrow signal intensity normal. No concerning focal marrow replacing lesion. No scalp soft tissue abnormality. Sinuses/Orbits: Globes orbital soft tissues within normal limits. Mild right sphenoid sinus disease. Paranasal sinuses are otherwise largely clear. No significant mastoid effusion. Other: None. IMPRESSION: 1. No acute intracranial abnormality. 2. 2.3 cm meningioma at the right middle cranial fossa. Two additional 1 cm meningiomas overlying the right frontal convexity as above. No associated edema or mass effect. 3. Otherwise normal brain MRI for age. Electronically Signed   By: Rise Mu M.D.   On: 07/22/2022 01:04   CT HEAD WO CONTRAST  Result Date: 07/21/2022 CLINICAL DATA:   Insert hip history EXAM: CT HEAD WITHOUT CONTRAST TECHNIQUE: Contiguous axial images were obtained from the base of the skull through the vertex without intravenous contrast. RADIATION DOSE REDUCTION: This exam was performed according to the departmental dose-optimization program which includes automated exposure control, adjustment of the mA and/or kV according to patient size and/or use of iterative reconstruction technique. COMPARISON:  03/16/2016 FINDINGS: Brain: Normal anatomic configuration. Parenchymal volume loss is commensurate with the patient's age with stable slightly asymmetric right hemispheric cortical atrophy. Mild, stable periventricular white matter changes are present likely reflecting the sequela of small vessel ischemia. 20 mm partially calcified slightly hyperdense extra-axial mass is seen within the right  middle cranial fossa abutting the anterior pole of right temporal lobe at axial image # 11/3 increased in size since prior examination, possibly representing a enlarging meningioma. No abnormal intra or extra-axial fluid collection. No abnormal mass effect or midline shift. No evidence of acute intracranial hemorrhage or infarct. Ventricular size is normal. Cerebellum unremarkable. Vascular: No asymmetric hyperdense vasculature at the skull base. Skull: Intact Sinuses/Orbits: Paranasal sinuses are clear. Orbits are unremarkable. Other: Mastoid air cells and middle ear cavities are clear. IMPRESSION: 1. No acute intracranial abnormality. No calvarial fracture. 2. Interval increase in size of a partially calcified slightly hyperdense extra-axial mass within the right middle cranial fossa abutting the anterior pole of the right temporal lobe, possibly representing a enlarging meningioma. This could be confirmed with contrast enhanced MRI examination. 3. Stable senescent changes. Electronically Signed   By: Helyn Numbers M.D.   On: 07/21/2022 21:02    EKG: Independently reviewed. Sinus  bradycardia, rate 59.   Assessment/Plan   1. Hypertensive crisis  - Presents after transient balance and speech difficulty and found to have SBP 263 - No acute findings on MRI brain   - BP improved with IV hydralazine  - She appears hypervolemic, plan to give one dose of IV Lasix now, continue benazepril-HCTZ, hydralazine, and Toprol, and use IV hydralazine as needed for SBP >160   2. Hypokalemia  - Replace potassium, repeat chem panel in am    3. Hypothyroidism  - Continue Cytomel and Synthroid   4. OSA  - CPAP qHS     DVT prophylaxis: Lovenox  Code Status: Full  Level of Care: Level of care: Progressive Family Communication: Sister at bedside  Disposition Plan:  Patient is from: home  Anticipated d/c is to: Home  Anticipated d/c date is: 07/23/22  Patient currently: Pending BP-control with oral medications  Consults called: None  Admission status: Inpatient     Briscoe Deutscher, MD [facility]  07/22/2022, 2:21 AM

## 2022-07-22 NOTE — ED Notes (Signed)
Got patient locked up to the monitor patient is resting with family at bedside

## 2022-07-22 NOTE — ED Notes (Signed)
Notified transport of need to take pt upstairs to bed

## 2022-07-22 NOTE — ED Notes (Signed)
ED TO INPATIENT HANDOFF REPORT  ED Nurse Name and Phone #: [phone]  S Name/Age/Gender [name] 77 y.o. female Room/Bed: 049C/049C  Code Status   Code Status: Full Code  Home/SNF/Other Home Patient oriented to: self, place, time, and situation Is this baseline? Yes   Triage Complete: Triage complete  Chief Complaint Hypertensive crisis [I16.9] Hypertensive emergency [I16.1]  Triage Note Pt in from home with hypertension and weakness. Pt states last night she noticed some balance issues, went to bed and woke up normal at 8am, had a regular day. About an hour ago (1915), pt states she started to notice it was hard for her to "speak loudly" - states she looked in a mirror to make sure her face was symmetrical and it was. HTN in triage - 263/94, speech clear, does report anxiety. No unilateral weakness or HA in triage. States hx of HTN and has taken all meds today   Allergies Allergies  Allergen Reactions   Bactrim [Sulfamethoxazole-Trimethoprim]    Epinephrine     shakey   Erythromycin    Sulfa Antibiotics Rash    Level of Care/Admitting Diagnosis ED Disposition     ED Disposition  Admit   Condition  --   Comment  Hospital Area: [facility] [[id number]]  Level of Care: Telemetry Cardiac [[id number]]  May admit patient to [facility] or [facility] if equivalent level of care is available:: No  Covid Evaluation: Asymptomatic - no recent exposure (last 10 days) testing not required  Diagnosis: Hypertensive emergency [phone]  Admitting Physician: RAI, RIPUDEEP K [[id number]]  Attending Physician: RAI, RIPUDEEP K [[id number]]  Certification:: I certify this patient will need inpatient services for at least 2 midnights          B Medical/Surgery History Past Medical History:  Diagnosis Date   Abnormal perimenopausal bleeding 03/07/2003   Allergy    Anemia    Anxiety    Arthritis    Dysuria 03/06/2002   Endometrial polyp 03/06/2004   Environmental  allergies    Fibroid 02/19/2003   H/O varicella    History of measles, mumps, or rubella    Hx of colonic polyps 12/15/2010   Hypertension 07/19/2010   echo for pre-op EF 55%  normal left wall thickness LA mildly dialated with mitral and tricuspid regurgatation   Hypothyroidism    Interstitial cystitis    Menopausal symptoms 03/06/2002   Obesity    Osteoarthritis    of knees   Osteopenia 02/04/2012   -1.8 T score right femur neck   PMB (postmenopausal bleeding) 06/21/2010   Sleep apnea    Urinary incontinence 03/06/2009   Vitamin D deficiency    history of   Past Surgical History:  Procedure Laterality Date   COLONOSCOPY  12/15/10   HYSTEROSCOPY  2006 and May 2012   for fibroids, endometrial polyps   MOUTH SURGERY     MYOMECTOMY  1974     A IV Location/Drains/Wounds Patient Lines/Drains/Airways Status     Active Line/Drains/Airways     Name Placement date Placement time Site Days   Peripheral IV 07/21/22 20 G Anterior;Left Forearm 07/21/22  2217  Forearm  1            Intake/Output Last 24 hours No intake or output data in the 24 hours ending 07/22/22 1157  Labs/Imaging Results for orders placed or performed during the hospital encounter of 07/21/22 (from the past 48 hour(s))  CBC     Status: None  Collection Time: 07/21/22  8:23 PM  Result Value Ref Range   WBC 7.7 4.0 - 10.5 K/uL   RBC 4.88 3.87 - 5.11 MIL/uL   Hemoglobin 13.0 12.0 - 15.0 g/dL   HCT 16.1 09.6 - 04.5 %   MCV 82.6 80.0 - 100.0 fL   MCH 26.6 26.0 - 34.0 pg   MCHC 32.3 30.0 - 36.0 g/dL   RDW 40.9 81.1 - 91.4 %   Platelets 245 150 - 400 K/uL   nRBC 0.0 0.0 - 0.2 %    Comment: Performed at [facility], [address] [place], [place] [address]  Comprehensive metabolic panel     Status: Abnormal   Collection Time: 07/21/22  8:23 PM  Result Value Ref Range   Sodium 138 135 - 145 mmol/L   Potassium 3.4 (L) 3.5 - 5.1 mmol/L   Chloride 102 98 - 111 mmol/L   CO2 27 22 - 32 mmol/L    Glucose, Bld 130 (H) 70 - 99 mg/dL    Comment: Glucose reference range applies only to samples taken after fasting for at least 8 hours.   BUN 17 8 - 23 mg/dL   Creatinine, Ser 6.21 0.44 - 1.00 mg/dL   Calcium 9.1 8.9 - 30.8 mg/dL   Total Protein 6.3 (L) 6.5 - 8.1 g/dL   Albumin 3.8 3.5 - 5.0 g/dL   AST 14 (L) 15 - 41 U/L   ALT 14 0 - 44 U/L   Alkaline Phosphatase 48 38 - 126 U/L   Total Bilirubin 0.5 0.3 - 1.2 mg/dL   GFR, Estimated >65 >78 mL/min    Comment: (NOTE) Calculated using the CKD-EPI Creatinine Equation (2021)    Anion gap 9 5 - 15    Comment: Performed at [facility], [address] [place], [place] [address]  Protime-INR     Status: None   Collection Time: 07/21/22  8:23 PM  Result Value Ref Range   Prothrombin Time 14.3 11.4 - 15.2 seconds   INR 1.1 0.8 - 1.2    Comment: (NOTE) INR goal varies based on device and disease states. Performed at [facility], [address], [place], [place] [address]   APTT     Status: None   Collection Time: 07/21/22  8:23 PM  Result Value Ref Range   aPTT 29 24 - 36 seconds    Comment: Performed at [facility], [address], [place], [place] [address]  Urine rapid drug screen (hosp performed)not at [facility]     Status: None   Collection Time: 07/21/22  9:25 PM  Result Value Ref Range   Opiates NONE DETECTED NONE DETECTED   Cocaine NONE DETECTED NONE DETECTED   Benzodiazepines NONE DETECTED NONE DETECTED   Amphetamines NONE DETECTED NONE DETECTED   Tetrahydrocannabinol NONE DETECTED NONE DETECTED   Barbiturates NONE DETECTED NONE DETECTED    Comment: (NOTE) DRUG SCREEN FOR MEDICAL PURPOSES ONLY.  IF CONFIRMATION IS NEEDED FOR ANY PURPOSE, NOTIFY LAB WITHIN 5 DAYS.  LOWEST DETECTABLE LIMITS FOR URINE DRUG SCREEN Drug Class                     Cutoff (ng/mL) Amphetamine and metabolites    1000 Barbiturate and metabolites    200 Benzodiazepine                 200 Opiates and metabolites         300 Cocaine and  metabolites        300 THC                            50 Performed at [facility], [address], [place], [place] [address]   Ethanol     Status: None   Collection Time: 07/21/22  9:54 PM  Result Value Ref Range   Alcohol, Ethyl (B) <10 <10 mg/dL    Comment: (NOTE) Lowest detectable limit for serum alcohol is 10 mg/dL.  For medical purposes only. Performed at [facility], [address] [place], [place] [address]   POC CBG, ED     Status: Abnormal   Collection Time: 07/21/22 10:11 PM  Result Value Ref Range   Glucose-Capillary 104 (H) 70 - 99 mg/dL    Comment: Glucose reference range applies only to samples taken after fasting for at least 8 hours.  Basic metabolic panel     Status: Abnormal   Collection Time: 07/22/22  3:59 AM  Result Value Ref Range   Sodium 140 135 - 145 mmol/L   Potassium 3.2 (L) 3.5 - 5.1 mmol/L   Chloride 104 98 - 111 mmol/L   CO2 26 22 - 32 mmol/L   Glucose, Bld 105 (H) 70 - 99 mg/dL    Comment: Glucose reference range applies only to samples taken after fasting for at least 8 hours.   BUN 11 8 - 23 mg/dL   Creatinine, Ser 0.98 0.44 - 1.00 mg/dL   Calcium 8.9 8.9 - 11.9 mg/dL   GFR, Estimated >14 >78 mL/min    Comment: (NOTE) Calculated using the CKD-EPI Creatinine Equation (2021)    Anion gap 10 5 - 15    Comment: Performed at [facility], [address], [place], [place] [address]  Magnesium     Status: None   Collection Time: 07/22/22  3:59 AM  Result Value Ref Range   Magnesium 2.1 1.7 - 2.4 mg/dL    Comment: Performed at [facility], [address] [place], [place] [address]  CBC     Status: None   Collection Time: 07/22/22  3:59 AM  Result Value Ref Range   WBC 8.7 4.0 - 10.5 K/uL   RBC 4.84 3.87 - 5.11 MIL/uL   Hemoglobin 13.2 12.0 - 15.0 g/dL   HCT 57.8 46.9 - 62.9 %   MCV 82.9 80.0 - 100.0 fL   MCH 27.3 26.0 - 34.0 pg   MCHC 32.9 30.0 - 36.0 g/dL   RDW 52.8 41.3 - 24.4 %    Platelets 239 150 - 400 K/uL   nRBC 0.0 0.0 - 0.2 %    Comment: Performed at [facility], [address], [place], [place] [address]  Brain natriuretic peptide     Status: Abnormal   Collection Time: 07/22/22  3:59 AM  Result Value Ref Range   B Natriuretic Peptide 200.9 (H) 0.0 - 100.0 pg/mL    Comment: Performed at [facility], [address], [place], [place] [address]   MR BRAIN W WO CONTRAST  Result Date: 07/22/2022 CLINICAL DATA:  Initial evaluation for neuro deficit, stroke. EXAM: MRI HEAD WITHOUT AND WITH CONTRAST TECHNIQUE: Multiplanar, multiecho pulse sequences of the brain and surrounding structures were obtained without and with intravenous contrast. CONTRAST:  8mL GADAVIST GADOBUTROL 1 MMOL/ML IV SOLN COMPARISON:  CT from 07/21/2022. FINDINGS: Brain: Cerebral volume within normal  limits. No significant cerebral white matter disease for age. No evidence for acute or subacute ischemia. No areas of chronic cortical infarction. No acute or chronic intracranial blood products. 2.3 cm meningioma seen at the right middle cranial fossa, overlying the anterior right temporal pole (series 18, image 25). Additional extra-axial foci of enhancement seen superiorly overlying the right frontal convexity measure up to 1 cm (series 18, images 43, 45, also likely small meningiomas. No associated edema or mass effect. No other mass lesion or abnormal enhancement. No midline shift or hydrocephalus. No extra-axial fluid collection. Pituitary gland suprasellar region within normal limits. Vascular: Major intracranial vascular flow voids are maintained. Skull and upper cervical spine: Craniocervical junction within normal limits. Bone marrow signal intensity normal. No concerning focal marrow replacing lesion. No scalp soft tissue abnormality. Sinuses/Orbits: Globes orbital soft tissues within normal limits. Mild right sphenoid sinus disease. Paranasal sinuses are otherwise largely clear. No  significant mastoid effusion. Other: None. IMPRESSION: 1. No acute intracranial abnormality. 2. 2.3 cm meningioma at the right middle cranial fossa. Two additional 1 cm meningiomas overlying the right frontal convexity as above. No associated edema or mass effect. 3. Otherwise normal brain MRI for age. Electronically Signed   By: Rise Mu M.D.   On: 07/22/2022 01:04   CT HEAD WO CONTRAST  Result Date: 07/21/2022 CLINICAL DATA:  Insert hip history EXAM: CT HEAD WITHOUT CONTRAST TECHNIQUE: Contiguous axial images were obtained from the base of the skull through the vertex without intravenous contrast. RADIATION DOSE REDUCTION: This exam was performed according to the departmental dose-optimization program which includes automated exposure control, adjustment of the mA and/or kV according to patient size and/or use of iterative reconstruction technique. COMPARISON:  03/16/2016 FINDINGS: Brain: Normal anatomic configuration. Parenchymal volume loss is commensurate with the patient's age with stable slightly asymmetric right hemispheric cortical atrophy. Mild, stable periventricular white matter changes are present likely reflecting the sequela of small vessel ischemia. 20 mm partially calcified slightly hyperdense extra-axial mass is seen within the right middle cranial fossa abutting the anterior pole of right temporal lobe at axial image # 11/3 increased in size since prior examination, possibly representing a enlarging meningioma. No abnormal intra or extra-axial fluid collection. No abnormal mass effect or midline shift. No evidence of acute intracranial hemorrhage or infarct. Ventricular size is normal. Cerebellum unremarkable. Vascular: No asymmetric hyperdense vasculature at the skull base. Skull: Intact Sinuses/Orbits: Paranasal sinuses are clear. Orbits are unremarkable. Other: Mastoid air cells and middle ear cavities are clear. IMPRESSION: 1. No acute intracranial abnormality. No calvarial  fracture. 2. Interval increase in size of a partially calcified slightly hyperdense extra-axial mass within the right middle cranial fossa abutting the anterior pole of the right temporal lobe, possibly representing a enlarging meningioma. This could be confirmed with contrast enhanced MRI examination. 3. Stable senescent changes. Electronically Signed   By: Helyn Numbers M.D.   On: 07/21/2022 21:02    Pending Labs Unresulted Labs (From admission, onward)     Start     Ordered   07/29/22 0500  Creatinine, serum  (enoxaparin (LOVENOX)    CrCl >/= 30 ml/min)  Weekly,   R     Comments: while on enoxaparin therapy    07/22/22 0159   07/22/22 0500  Basic metabolic panel  Daily,   R      07/22/22 0159   07/22/22 0500  CBC  Daily,   R      07/22/22 0159  Vitals/Pain Today's Vitals   07/22/22 0900 07/22/22 0930 07/22/22 1030 07/22/22 1157  BP: (!) 139/54 (!) 139/58 138/74   Pulse: (!) 59 (!) 59    Resp: 17     Temp:    98.3 F (36.8 C)  TempSrc:      SpO2: 98% 96%    Weight:      PainSc:        Isolation Precautions No active isolations  Medications Medications  metoprolol succinate (TOPROL-XL) 24 hr tablet 25 mg (25 mg Oral Given 07/22/22 0950)  levothyroxine (SYNTHROID) tablet 112 mcg (112 mcg Oral Given 07/22/22 0609)  liothyronine (CYTOMEL) tablet 5 mcg (5 mcg Oral Given 07/22/22 0711)  enoxaparin (LOVENOX) injection 40 mg (40 mg Subcutaneous Patient Refused/Not Given 07/22/22 0950)  acetaminophen (TYLENOL) tablet 650 mg (has no administration in time range)    Or  acetaminophen (TYLENOL) suppository 650 mg (has no administration in time range)  HYDROcodone-acetaminophen (NORCO/VICODIN) 5-325 MG per tablet 1 tablet (has no administration in time range)  polyethylene glycol (MIRALAX / GLYCOLAX) packet 17 g (has no administration in time range)  ondansetron (ZOFRAN) tablet 4 mg (has no administration in time range)    Or  ondansetron (ZOFRAN) injection 4 mg (has no  administration in time range)  benazepril (LOTENSIN) tablet 20 mg (20 mg Oral Given 07/22/22 0950)    And  hydrochlorothiazide (HYDRODIURIL) tablet 12.5 mg (12.5 mg Oral Given 07/22/22 0950)  hydrALAZINE (APRESOLINE) tablet 50 mg (50 mg Oral Given 07/22/22 0710)  hydrALAZINE (APRESOLINE) injection 10 mg (has no administration in time range)  hydrALAZINE (APRESOLINE) injection 10 mg (10 mg Intravenous Given 07/21/22 2222)  hydrALAZINE (APRESOLINE) injection 20 mg (20 mg Intravenous Given 07/21/22 2258)  gadobutrol (GADAVIST) 1 MMOL/ML injection 8 mL (8 mLs Intravenous Contrast Given 07/22/22 0001)  potassium chloride SA (KLOR-CON M) CR tablet 40 mEq (40 mEq Oral Given 07/22/22 0339)  furosemide (LASIX) injection 20 mg (20 mg Intravenous Given 07/22/22 0339)    Mobility walks     Focused Assessments Cardiac Assessment Handoff:    No results found for: "CKTOTAL", "CKMB", "CKMBINDEX", "TROPONINI" No results found for: "DDIMER" Does the Patient currently have chest pain? No    R Recommendations: See Admitting Provider Note  Report given to:   Additional Notes: Pt A&Ox4, ambulatory, in NAD at this time.

## 2022-07-22 NOTE — ED Notes (Signed)
Assist patient back to the bed hooked patient back up to the monitor patient is resting with family at bedside

## 2022-07-23 ENCOUNTER — Observation Stay (HOSPITAL_BASED_OUTPATIENT_CLINIC_OR_DEPARTMENT_OTHER): Payer: Medicare PPO

## 2022-07-23 DIAGNOSIS — I169 Hypertensive crisis, unspecified: Secondary | ICD-10-CM | POA: Diagnosis not present

## 2022-07-23 DIAGNOSIS — E039 Hypothyroidism, unspecified: Secondary | ICD-10-CM | POA: Diagnosis not present

## 2022-07-23 DIAGNOSIS — F419 Anxiety disorder, unspecified: Secondary | ICD-10-CM | POA: Diagnosis not present

## 2022-07-23 DIAGNOSIS — I161 Hypertensive emergency: Secondary | ICD-10-CM | POA: Diagnosis not present

## 2022-07-23 DIAGNOSIS — I10 Essential (primary) hypertension: Secondary | ICD-10-CM | POA: Diagnosis present

## 2022-07-23 DIAGNOSIS — E876 Hypokalemia: Secondary | ICD-10-CM | POA: Diagnosis not present

## 2022-07-23 LAB — CBC
HCT: 42.3 % (ref 36.0–46.0)
Hemoglobin: 13.7 g/dL (ref 12.0–15.0)
MCH: 26.9 pg (ref 26.0–34.0)
MCHC: 32.4 g/dL (ref 30.0–36.0)
MCV: 83.1 fL (ref 80.0–100.0)
Platelets: 242 10*3/uL (ref 150–400)
RBC: 5.09 MIL/uL (ref 3.87–5.11)
RDW: 13.6 % (ref 11.5–15.5)
WBC: 8.2 10*3/uL (ref 4.0–10.5)
nRBC: 0 % (ref 0.0–0.2)

## 2022-07-23 LAB — BASIC METABOLIC PANEL
Anion gap: 10 (ref 5–15)
BUN: 17 mg/dL (ref 8–23)
CO2: 27 mmol/L (ref 22–32)
Calcium: 8.9 mg/dL (ref 8.9–10.3)
Chloride: 103 mmol/L (ref 98–111)
Creatinine, Ser: 0.79 mg/dL (ref 0.44–1.00)
GFR, Estimated: >60 mL/min (ref >60)
Glucose, Bld: 104 mg/dL — ABNORMAL HIGH (ref 70–99)
Potassium: 3.6 mmol/L (ref 3.5–5.1)
Sodium: 140 mmol/L (ref 135–145)

## 2022-07-23 LAB — ECHOCARDIOGRAM COMPLETE
Area-P 1/2: 3.89 cm2
S' Lateral: 2.6 cm
Weight: 2832 oz

## 2022-07-23 MED ORDER — HYDRALAZINE HCL 50 MG PO TABS: 50.0000 mg | ORAL_TABLET | Freq: Three times a day (TID) | ORAL | 3 refills | Status: AC

## 2022-07-23 MED ORDER — ONDANSETRON HCL 4 MG PO TABS: 4.0000 mg | ORAL_TABLET | Freq: Four times a day (QID) | ORAL | 0 refills | Status: DC | PRN

## 2022-07-23 NOTE — Care Management Obs Status (Signed)
MEDICARE OBSERVATION STATUS NOTIFICATION   Patient Details  Name: [name] MRN: [id number] Date of Birth: May 20, 1945   Medicare Observation Status Notification Given:  Yes    Zailyn Rowser G., RN 07/23/2022, 9:27 AM

## 2022-07-23 NOTE — Care Management Obs Status (Signed)
MEDICARE OBSERVATION STATUS NOTIFICATION   Patient Details  Name: [name] MRN: [id number] Date of Birth: Jan 11, 1946   Medicare Observation Status Notification Given:  Yes    Rohen Kimes G., RN 07/23/2022, 9:33 AM

## 2022-07-23 NOTE — Care Management CC44 (Signed)
Condition Code 44 Documentation Completed  Patient Details  Name: [name] MRN: [id number] Date of Birth: Jan 13, 1946   Condition Code 44 given:    Patient signature on Condition Code 44 notice:    Documentation of 2 MD's agreement:    Code 44 added to claim:       Vihana Kydd G., RN 07/23/2022, 9:33 AM

## 2022-07-23 NOTE — Progress Notes (Signed)
  Echocardiogram 2D Echocardiogram has been performed.  [name] 07/23/2022, 1:06 PM

## 2022-07-23 NOTE — Discharge Summary (Signed)
Physician Discharge Summary   Patient: [name] MRN: [id number] DOB: Mar 13, 1945  Admit date:     07/21/2022  Discharge date: 07/23/22  Discharge Physician: Thad Ranger, MD    PCP: [name], MD   Recommendations at discharge:   Hydralazine increased to 50mg  3 times daily  Discharge Diagnoses:    Hypertensive emergency   Anxiety   Hypothyroidism   Obstructive sleep apnea treated with continuous positive airway pressure (CPAP)   Hypokalemia   Obesity (BMI 30-39.9)   Hypertension   Hospital Course: Patient is a 77 year old female with HTN, hypothyroidism, anxiety, OSA on CPAP, presented to ED with difficulty with balance and speech.  Patient reported that she was having difficulty with her balance a day before the admission, noted that she kept falling to the side.  Also had trouble speaking to her sister, resolved after several minutes.  Denied any headache, focal weakness or numbness.  No vision changes.  Reported adherence with her blood pressure medications.  She also noticed increased bilateral lower extremity edema recently In ED, temp 97.6, pulse 59, RR 22, BP 263/94 in triage, O2 sats 97 to 100% on room air MRI brain showed no acute intracranial abnormality, 2.3 cm meningioma in the right middle cranial fossa, 2 additional 1 cm meningiomas overlying the right frontal convexity, no associated edema or mass effect, otherwise normal brain MRI for ag   Assessment and Plan: Hypertensive emergency -Presented with BP of 263/94 with transient focal neurological deficits including gait ataxia and slurred speech.  Symptoms now resolved. -MRI brain negative for acute stroke. -BP improved with several doses of IV hydralazine, also received Lasix 20 mg IV x 1 -Resumed on her oral hypertensives benazepril HCTZ, Toprol-XL 25 mg daily -Increased hydralazine to 50 mg 3 times daily -2D echo showed EF of 65 to 70%, normal LV function, mild concentric EF, indeterminate left  ventricular diastolic parameters.      Anxiety -Per patient, her symptoms could have precipitated due to acute anxiety -However at this time, declined any as needed anxiety medications.        Hypothyroidism -Continue Synthroid -TSH 1.1 on 07/15/2021     Obstructive sleep apnea treated with (CPAP) -Continue CPAP     Hypokalemia -Resolved     Obesity (BMI 30-39.9) Estimated body mass index is 34.28 kg/m as calculated from the following:   Height as of 05/22/22: 5' 0.25" (1.53 m).   Weight as of this encounter: 80.3 kg.      Pain control - [employer] Controlled Substance Reporting System database was reviewed. and patient was instructed, not to drive, operate heavy machinery, perform activities at heights, swimming or participation in water activities or provide baby-sitting services while on Pain, Sleep and Anxiety Medications; until their outpatient Physician has advised to do so again. Also recommended to not to take more than prescribed Pain, Sleep and Anxiety Medications.  Consultants: None Procedures performed: 2D echo Disposition: Home Diet recommendation:  Discharge Diet Orders (From admission, onward)     Start     Ordered   07/23/22 0000  Diet - low sodium heart healthy        07/23/22 1320            DISCHARGE MEDICATION: Allergies as of 07/23/2022       Reactions   Bactrim [sulfamethoxazole-trimethoprim]    Epinephrine    shakey   Erythromycin    Sulfa Antibiotics Rash        Medication List  TAKE these medications    benazepril-hydrochlorthiazide 20-12.5 MG tablet Commonly known as: LOTENSIN HCT Take 2 tablets by mouth daily.   cholecalciferol 25 MCG (1000 UNIT) tablet Commonly known as: VITAMIN D3 Take 1,000 Units by mouth daily.   hydrALAZINE 50 MG tablet Commonly known as: APRESOLINE Take 1 tablet (50 mg total) by mouth 3 (three) times daily with meals. What changed:  medication strength how much to take how to take  this when to take this additional instructions   levothyroxine 112 MCG tablet Commonly known as: SYNTHROID Take 1 tablet (112 mcg total) by mouth daily before breakfast.   liothyronine 5 MCG tablet Commonly known as: CYTOMEL Take 1 tablet by mouth before breakfast   metoprolol succinate 25 MG 24 hr tablet Commonly known as: TOPROL-XL TAKE 1 TABLET(25 MG) BY MOUTH DAILY   ondansetron 4 MG tablet Commonly known as: ZOFRAN Take 1 tablet (4 mg total) by mouth every 6 (six) hours as needed for nausea or vomiting.   vitamin C 1000 MG tablet Take 1,000 mg by mouth daily. One tablet a day        Follow-up Information     [name], MD. Schedule an appointment as soon as possible for a visit in 2 week(s).   Specialty: Family Medicine Why: for hospital follow-up Contact information: [address] [place] [address] [phone]                Discharge Exam: Ceasar Mons Weights   07/21/22 2020  Weight: 80.3 kg   S: No acute complaints, no chest pain, shortness of breath, headache or slurred speech.  BP has been controlled.  Sister at the bedside.  Wants to go home  BP (!) 140/80 (BP Location: Right Arm)   Pulse (!) 59   Temp 98.1 F (36.7 C) (Oral)   Resp 16   Wt 80.3 kg   SpO2 100%   BMI 34.28 kg/m   Physical Exam General: Alert and oriented x 3, NAD Cardiovascular: S1 S2 clear, RRR.  Respiratory: CTAB, no wheezing, rales or rhonchi Gastrointestinal: Soft, nontender, nondistended, NBS Ext: no pedal edema bilaterally Psych: Normal affect    Condition at discharge: fair  The results of significant diagnostics from this hospitalization (including imaging, microbiology, ancillary and laboratory) are listed below for reference.   Imaging Studies: ECHOCARDIOGRAM COMPLETE  Result Date: 07/23/2022    ECHOCARDIOGRAM REPORT   Patient Name:   [name] Date of Exam: 07/23/2022 Medical Rec #:  [id number]          Height:       60.2 in  Accession #:    [id number]         Weight:       177.0 lb Date of Birth:  05/31/1945          BSA:          1.777 m Patient Age:    77 years           BP:           140/80 mmHg Patient Gender: F                  HR:           60 bpm. Exam Location:  Inpatient Procedure: 2D Echo Indications:    TIA  History:        Patient has prior history of Echocardiogram examinations. Risk  Factors:Sleep Apnea and Hypertension.  Sonographer:    Delcie Roch RDCS Referring Phys: [id number] Tiarna Koppen K Sherion Dooly IMPRESSIONS  1. Left ventricular ejection fraction, by estimation, is 65 to 70%. The left ventricle has normal function. The left ventricle has no regional wall motion abnormalities. There is mild concentric left ventricular hypertrophy. Left ventricular diastolic parameters are indeterminate.  2. Right ventricular systolic function is normal. The right ventricular size is normal. Tricuspid regurgitation signal is inadequate for assessing PA pressure.  3. Left atrial size was mildly dilated.  4. The mitral valve is grossly normal. Mild mitral valve regurgitation. No evidence of mitral stenosis.  5. The aortic valve is tricuspid. Aortic valve regurgitation is not visualized. No aortic stenosis is present.  6. The inferior vena cava is normal in size with greater than 50% respiratory variability, suggesting right atrial pressure of 3 mmHg. Conclusion(s)/Recommendation(s): No intracardiac source of embolism detected on this transthoracic study. Consider a transesophageal echocardiogram to exclude cardiac source of embolism if clinically indicated. FINDINGS  Left Ventricle: Left ventricular ejection fraction, by estimation, is 65 to 70%. The left ventricle has normal function. The left ventricle has no regional wall motion abnormalities. The left ventricular internal cavity size was normal in size. There is  mild concentric left ventricular hypertrophy. Left ventricular diastolic parameters are indeterminate. Right  Ventricle: The right ventricular size is normal. No increase in right ventricular wall thickness. Right ventricular systolic function is normal. Tricuspid regurgitation signal is inadequate for assessing PA pressure. Left Atrium: Left atrial size was mildly dilated. Right Atrium: Right atrial size was normal in size. Pericardium: Trivial pericardial effusion is present. Presence of epicardial fat layer. Mitral Valve: The mitral valve is grossly normal. Mild mitral valve regurgitation. No evidence of mitral valve stenosis. Tricuspid Valve: The tricuspid valve is grossly normal. Tricuspid valve regurgitation is trivial. No evidence of tricuspid stenosis. Aortic Valve: The aortic valve is tricuspid. Aortic valve regurgitation is not visualized. No aortic stenosis is present. Pulmonic Valve: The pulmonic valve was grossly normal. Pulmonic valve regurgitation is not visualized. No evidence of pulmonic stenosis. Aorta: The aortic root and ascending aorta are structurally normal, with no evidence of dilitation. Venous: The inferior vena cava is normal in size with greater than 50% respiratory variability, suggesting right atrial pressure of 3 mmHg. IAS/Shunts: The atrial septum is grossly normal.  LEFT VENTRICLE PLAX 2D LVIDd:         4.80 cm   Diastology LVIDs:         2.60 cm   LV e' medial:    5.55 cm/s LV PW:         1.20 cm   LV E/e' medial:  19.8 LV IVS:        1.20 cm   LV e' lateral:   6.64 cm/s LVOT diam:     1.70 cm   LV E/e' lateral: 16.6 LV SV:         77 LV SV Index:   43 LVOT Area:     2.27 cm  RIGHT VENTRICLE             IVC RV Basal diam:  2.70 cm     IVC diam: 1.80 cm RV S prime:     12.40 cm/s TAPSE (M-mode): 2.6 cm LEFT ATRIUM             Index LA diam:        3.70 cm 2.08 cm/m LA Vol (A2C):   51.6 ml 29.03 ml/m LA  Vol (A4C):   86.2 ml 48.50 ml/m LA Biplane Vol: 67.3 ml 37.87 ml/m  AORTIC VALVE LVOT Vmax:   146.00 cm/s LVOT Vmean:  92.200 cm/s LVOT VTI:    0.338 m  AORTA Ao Root diam: 3.00 cm Ao Asc  diam:  3.10 cm MITRAL VALVE MV Area (PHT): 3.89 cm     SHUNTS MV Decel Time: 195 msec     Systemic VTI:  0.34 m MV E velocity: 110.00 cm/s  Systemic Diam: 1.70 cm MV A velocity: 106.00 cm/s MV E/A ratio:  1.04 Lennie Odor MD Electronically signed by Lennie Odor MD Signature Date/Time: 07/23/2022/1:10:15 PM    Final    MR BRAIN W WO CONTRAST  Result Date: 07/22/2022 CLINICAL DATA:  Initial evaluation for neuro deficit, stroke. EXAM: MRI HEAD WITHOUT AND WITH CONTRAST TECHNIQUE: Multiplanar, multiecho pulse sequences of the brain and surrounding structures were obtained without and with intravenous contrast. CONTRAST:  8mL GADAVIST GADOBUTROL 1 MMOL/ML IV SOLN COMPARISON:  CT from 07/21/2022. FINDINGS: Brain: Cerebral volume within normal limits. No significant cerebral white matter disease for age. No evidence for acute or subacute ischemia. No areas of chronic cortical infarction. No acute or chronic intracranial blood products. 2.3 cm meningioma seen at the right middle cranial fossa, overlying the anterior right temporal pole (series 18, image 25). Additional extra-axial foci of enhancement seen superiorly overlying the right frontal convexity measure up to 1 cm (series 18, images 43, 45, also likely small meningiomas. No associated edema or mass effect. No other mass lesion or abnormal enhancement. No midline shift or hydrocephalus. No extra-axial fluid collection. Pituitary gland suprasellar region within normal limits. Vascular: Major intracranial vascular flow voids are maintained. Skull and upper cervical spine: Craniocervical junction within normal limits. Bone marrow signal intensity normal. No concerning focal marrow replacing lesion. No scalp soft tissue abnormality. Sinuses/Orbits: Globes orbital soft tissues within normal limits. Mild right sphenoid sinus disease. Paranasal sinuses are otherwise largely clear. No significant mastoid effusion. Other: None. IMPRESSION: 1. No acute intracranial  abnormality. 2. 2.3 cm meningioma at the right middle cranial fossa. Two additional 1 cm meningiomas overlying the right frontal convexity as above. No associated edema or mass effect. 3. Otherwise normal brain MRI for age. Electronically Signed   By: Rise Mu M.D.   On: 07/22/2022 01:04   CT HEAD WO CONTRAST  Result Date: 07/21/2022 CLINICAL DATA:  Insert hip history EXAM: CT HEAD WITHOUT CONTRAST TECHNIQUE: Contiguous axial images were obtained from the base of the skull through the vertex without intravenous contrast. RADIATION DOSE REDUCTION: This exam was performed according to the departmental dose-optimization program which includes automated exposure control, adjustment of the mA and/or kV according to patient size and/or use of iterative reconstruction technique. COMPARISON:  03/16/2016 FINDINGS: Brain: Normal anatomic configuration. Parenchymal volume loss is commensurate with the patient's age with stable slightly asymmetric right hemispheric cortical atrophy. Mild, stable periventricular white matter changes are present likely reflecting the sequela of small vessel ischemia. 20 mm partially calcified slightly hyperdense extra-axial mass is seen within the right middle cranial fossa abutting the anterior pole of right temporal lobe at axial image # 11/3 increased in size since prior examination, possibly representing a enlarging meningioma. No abnormal intra or extra-axial fluid collection. No abnormal mass effect or midline shift. No evidence of acute intracranial hemorrhage or infarct. Ventricular size is normal. Cerebellum unremarkable. Vascular: No asymmetric hyperdense vasculature at the skull base. Skull: Intact Sinuses/Orbits: Paranasal sinuses are clear. Orbits are unremarkable. Other:  Mastoid air cells and middle ear cavities are clear. IMPRESSION: 1. No acute intracranial abnormality. No calvarial fracture. 2. Interval increase in size of a partially calcified slightly hyperdense  extra-axial mass within the right middle cranial fossa abutting the anterior pole of the right temporal lobe, possibly representing a enlarging meningioma. This could be confirmed with contrast enhanced MRI examination. 3. Stable senescent changes. Electronically Signed   By: Helyn Numbers M.D.   On: 07/21/2022 21:02   Home sleep test  Result Date: 06/27/2022 Huston Foley, MD     06/29/2022  4:25 PM  [facility] HOME SLEEP TEST (Watch PAT) REPORT STUDY DATE: 06/27/22 DOB: Jul 25, 1945 MRN: [id number] ORDERING CLINICIAN: Huston Foley, MD, PhD  REFERRING CLINICIANDeatra James, MD CLINICAL INFORMATION/HISTORY: 77 year old female with an underlying complex medical history of hypertension, hypothyroidism, postmenopausal bleeding, vitamin D deficiency, allergies, anemia, anxiety, interstitial cystitis, osteoarthritis, and obesity, who has been on CPAP of 8 cm with EPR of 1. She is compliant with treatment and qualifies for new equipment. Epworth sleepiness score: 9/24. BMI: 36.4 kg/m FINDINGS: Sleep Summary: Total Recording Time (hours, min): 7 hours, 9 min Total Sleep Time (hours, min):  4 hours, 53 min Percent REM (%):    3.9% Respiratory Indices: Calculated pAHI (per hour):  29.7/hour  - utilizing the 4% desaturation criteria for hypopneas, per medicare guidelines. REM pAHI:    N/A     NREM pAHI: 29.7/hour Central pAHI: 6.8/hour Oxygen Saturation Statistics:  Oxygen Saturation (%) Mean: 93% Minimum oxygen saturation (%):                 83% O2 Saturation Range (%): 83 - 98%  O2 Saturation (minutes) <=88%: 2.1 min Pulse Rate Statistics: Pulse Mean (bpm):    53/min  Pulse Range (42 - 77/min) IMPRESSION: OSA (obstructive sleep apnea) RECOMMENDATION: This home sleep test demonstrates moderate obstructive sleep apnea with a total AHI of 29.7/hour and O2 nadir of 83%. Moderate to loud snoring was detected. Ongoing treatment with a positive airway pressure (PAP) device is recommended. The patient should be  eligible for a new CPAP machine, which I will prescribe, pressure of 8 cm with EPR of 1. There was a mild central sleep apnea component, probably not of clinical significance. A full night titration study may be considered to optimize treatment settings, monitor proper oxygen saturations and aid with improvement of tolerance and adherence, if needed down the road. Alternative treatment options may include a dental device through dentistry or orthodontics in selected patients or Inspire (hypoglossal nerve stimulator) in carefully selected patients (meeting inclusion criteria).  Concomitant weight loss is recommended (where clinically appropriate). Please note that untreated obstructive sleep apnea may carry additional perioperative morbidity. Patients with significant obstructive sleep apnea should receive perioperative PAP therapy and the surgeons and particularly the anesthesiologist should be informed of the diagnosis and the severity of the sleep disordered breathing. The patient should be cautioned not to drive, work at heights, or operate dangerous or heavy equipment when tired or sleepy. Review and reiteration of good sleep hygiene measures should be pursued with any patient. Other causes of the patient's symptoms, including circadian rhythm disturbances, an underlying mood disorder, medication effect and/or an underlying medical problem cannot be ruled out based on this test. Clinical correlation is recommended. The patient and her referring provider will be notified of the test results. The patient will be seen in follow up in sleep clinic at [facility]. I certify that I have reviewed the raw data recording  prior to the issuance of this report in accordance with the standards of the American Academy of Sleep Medicine (AASM). INTERPRETING PHYSICIAN: Huston Foley, MD, PhD Medical Director, [facility]) Diplomat, ABPN (Neurology and Sleep) [facility] [address], [address] [place], [place] [address] [phone]    Microbiology: Results for orders placed or performed in visit on 09/01/16  Urine Culture     Status: None   Collection Time: 09/01/16  9:53 AM   Specimen: Blood   UC  Result Value Ref Range Status   Urine Culture, Routine Final report  Final   Organism ID, Bacteria No growth  Final    Labs: CBC: Recent Labs  Lab 07/21/22 2023 07/22/22 0359 07/23/22 0726  WBC 7.7 8.7 8.2  HGB 13.0 13.2 13.7  HCT 40.3 40.1 42.3  MCV 82.6 82.9 83.1  PLT 245 239 242   Basic Metabolic Panel: Recent Labs  Lab 07/21/22 2023 07/22/22 0359 07/23/22 0726  NA 138 140 140  K 3.4* 3.2* 3.6  CL 102 104 103  CO2 27 26 27   GLUCOSE 130* 105* 104*  BUN 17 11 17   CREATININE 0.76 0.64 0.79  CALCIUM 9.1 8.9 8.9  MG  --  2.1  --    Liver Function Tests: Recent Labs  Lab 07/21/22 2023  AST 14*  ALT 14  ALKPHOS 48  BILITOT 0.5  PROT 6.3*  ALBUMIN 3.8   CBG: Recent Labs  Lab 07/21/22 2211  GLUCAP 104*    Discharge time spent: greater than 30 minutes.  Signed: Thad Ranger, MD [facility] 07/23/2022

## 2022-07-25 ENCOUNTER — Ambulatory Visit (INDEPENDENT_AMBULATORY_CARE_PROVIDER_SITE_OTHER): Payer: Medicare PPO | Admitting: Psychology

## 2022-07-25 DIAGNOSIS — F411 Generalized anxiety disorder: Secondary | ICD-10-CM | POA: Diagnosis not present

## 2022-07-25 NOTE — Progress Notes (Signed)
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 (Status: maintained) -- No Description Entered  Excessive and/or unrealistic worry that is difficult to control occurring more days than not for at least 6 months about a number of events or activities. (Status: maintained) -- No Description Entered  Medication Status compliance  Safety none  If Suicidal or Homicidal State Action Taken: unspecified  Current Risk: low Medications unspecified Objectives Related Problem: Resolve the core conflict that is the source of anxiety. Description: Describe situations, thoughts, feelings, and actions associated with anxieties and worries, their impact on functioning, and attempts to resolve them. Target Date: 2022-08-14 Frequency: Daily Modality: individual Progress: 80%  Related Problem: Resolve the core conflict that is the source of anxiety. Description: Learn and implement calming skills to reduce overall anxiety and manage anxiety symptoms. Target Date: 2022-08-14 Frequency: Daily Modality: individual Progress: 75%  Related Problem: Resolve the core conflict that is the source of anxiety. Description: Learn and implement problem-solving strategies for realistically addressing worries. Target Date: 2022-08-14 Frequency: Daily Modality: individual Progress: 75%  Related Problem: Resolve the core conflict that is the source of anxiety. Description: Maintain involvement in work, family, and social activities. Target Date: 2022-08-14 Frequency: Daily Modality: individual Progress: 70%  Related Problem: Restore restful sleep pattern. Description: Describe the history and details of sleep pattern. Target Date: 2022-08-14 Frequency: Daily Modality: individual Progress: 80%    Client Response full compliance  Service Location Location, [address] [place], [place] [address]  Service Code cpt [phone] P Self-monitoring  Lifestyle change (exercise, nutrition)  Self care activities  Distress tolerance skill  Identify/label emotions  Identify automatic thoughts  Facilitate problem solving  Normalize/Reframe  Behavioral activation plan  Validate/empathize  Comments  Dx.: Generalized Anxiety (F41.1)  Meds: No psychotropics Goals: Ahavah is seeking to develop a more satisfying life-style that takes into account her current physical condition. Goal date 11-04-20. States that the current situation in the world (politics and Covid 19) is making her very anxious. She is "obsessed" with the news (social media) and remains in a constant state of stress. She wants to learn to be less reactive. Will initiate behaviorally oriented individual therapy to address symptoms. Goal date is 10-04-21. While patient has realized some improvement in level of anxiety, she still expresses considerable stress and anxiety. Continues to need better balance in life. Revised date is 6-24.  Patient agrees to a Film/video editor session. She is at home and I am at my home office.   Barrett was in the hospital for 3 days after her blood pressure went up high. She thinks it was related to stress, particularly regarding her end of life plans she is trying to finalize. She will mneet with attorney tomorrow to complete documents. Sister stayed with her every night in the hospital. Her former in laws (ex-husband's brother and his wife) also came to visit her in the hospital. Nelani talks to them almost weekly. She has chosen former sister in Social worker as the executor of her will. We talked about reducing her stress to help with her blood pressure. She will work to address this issue moving forward.                                                                                 `  Garrel Ridgel, PhD  Time: 3:15p-4:00p 45 minutes

## 2022-07-27 ENCOUNTER — Encounter: Payer: Self-pay | Admitting: Internal Medicine

## 2022-07-27 ENCOUNTER — Ambulatory Visit: Payer: Medicare PPO | Admitting: Internal Medicine

## 2022-07-27 VITALS — BP 120/80 | HR 67 | Ht 60.25 in | Wt 180.8 lb

## 2022-07-27 DIAGNOSIS — E039 Hypothyroidism, unspecified: Secondary | ICD-10-CM

## 2022-07-27 NOTE — Progress Notes (Signed)
Patient ID: [name], female   DOB: [birth year], 77 y.o.   MRN: [id number]  HPI  Mr. Linne is a 77 y.o.-year-old female, presenting for f/u for acquired hypothyroidism. Last visit 1 year ago.  Interim hx: She continues to have fatigue, imbalance, but only rarely palpitations. She has balance problems - was in PT at last OV >> finished. No falls since last OV Since last visit, she was admitted with hypertensive crisis 07/21/2022.  At that time, blood pressure was 263/94.  An MRI did not show a stroke but several meningiomas. Her hydralazine dose was increased. She is stressed - her sister in sick.  Reviewed hx: She has been diagnosed with hypothyroidism "many years" ago.  She continues on both liothyronine and levothyroxine.  She takes LT4 112 mcg and LT3 5 mcg daily (equivalent of 132 mcg of LT4): - in am - fasting - at least 1 hour from b'fast - no Fe, MVI, PPIs - no Calcium  - not on Biotin On vit C and D at night.  Reviewed latest TFTs: Lab Results  Component Value Date   TSH 1.12 07/15/2021   TSH 1.210 07/16/2020   TSH 1.880 06/23/2019   TSH 0.963 03/28/2018   TSH 1.240 08/14/2017   TSH 1.230 09/01/2016   TSH 1.75 11/13/2015   TSH 1.34 04/14/2015   TSH 3.549 02/12/2015   TSH 5.529 (H) 12/02/2014   FREET4 1.16 07/15/2021   FREET4 1.71 07/16/2020   FREET4 1.46 06/23/2019   FREET4 1.67 03/28/2018   FREET4 1.82 (H) 08/14/2017   FREET4 1.75 09/01/2016   FREET4 1.6 11/13/2015   FREET4 1.37 04/14/2015   FREET4 1.71 04/30/2013   FREET4 1.45 10/05/2012   She complains of poor memory when the TSH is close to the upper limit of normal.  She started to feel much better after the last increase in dose in 02/2015) remains normal after.   She continues to complain of insomnia, which is chronic for her.  Pt denies: - feeling nodules in neck - hoarseness - dysphagia - choking  She has no FH of thyroid disorders. No FH of thyroid cancer. No h/o radiation tx to head  or neck. No herbal supplements. No Biotin use. No recent steroids use.   She has anxiety >> seeing a therapist. She uses a CPAP machine >> cough in am>> now not using the water tank >> cough resolved. She has osteopenia.  ROS: + See HPI  I reviewed pt's medications, allergies, PMH, social hx, family hx, and changes were documented in the history of present illness. Otherwise, unchanged from my initial visit note.  Past Medical History:  Diagnosis Date   Abnormal perimenopausal bleeding 03/07/2003   Allergy    Anemia    Anxiety    Arthritis    Dysuria 03/06/2002   Endometrial polyp 03/06/2004   Environmental allergies    Fibroid 02/19/2003   H/O varicella    History of measles, mumps, or rubella    Hx of colonic polyps 12/15/2010   Hypertension 07/19/2010   echo for pre-op EF 55%  normal left wall thickness LA mildly dialated with mitral and tricuspid regurgatation   Hypothyroidism    Interstitial cystitis    Menopausal symptoms 03/06/2002   Obesity    Osteoarthritis    of knees   Osteopenia 02/04/2012   -1.8 T score right femur neck   PMB (postmenopausal bleeding) 06/21/2010   Sleep apnea    Urinary incontinence 03/06/2009   Vitamin D deficiency  history of   Past Surgical History:  Procedure Laterality Date   COLONOSCOPY  12/15/10   HYSTEROSCOPY  2006 and May 2012   for fibroids, endometrial polyps   MOUTH SURGERY     MYOMECTOMY  1974   Social History   Social History   Marital Status: Divorced    Spouse Name: N/A   Number of Children: 0   Occupational History   Retired, 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 Education: [employer]. Exercise: Yoga 2 times a week.   Current Outpatient Medications on File  Prior to Visit  Medication Sig Dispense Refill   Ascorbic Acid (VITAMIN C) 1000 MG tablet Take 1,000 mg by mouth daily. One tablet a day     benazepril-hydrochlorthiazide (LOTENSIN HCT) 20-12.5 MG tablet Take 2 tablets by mouth daily. 180 tablet 1   cholecalciferol (VITAMIN D3) 25 MCG (1000 UNIT) tablet Take 1,000 Units by mouth daily.     hydrALAZINE (APRESOLINE) 50 MG tablet Take 1 tablet (50 mg total) by mouth 3 (three) times daily with meals. 90 tablet 3   levothyroxine (SYNTHROID) 112 MCG tablet Take 1 tablet (112 mcg total) by mouth daily before breakfast. 90 tablet 3   liothyronine (CYTOMEL) 5 MCG tablet Take 1 tablet by mouth before breakfast 90 tablet 3   metoprolol succinate (TOPROL-XL) 25 MG 24 hr tablet TAKE 1 TABLET(25 MG) BY MOUTH DAILY 90 tablet 3   ondansetron (ZOFRAN) 4 MG tablet Take 1 tablet (4 mg total) by mouth every 6 (six) hours as needed for nausea or vomiting. 20 tablet 0   No current facility-administered medications on file prior to visit.   Allergies  Allergen Reactions   Bactrim [Sulfamethoxazole-Trimethoprim]    Epinephrine     shakey   Erythromycin    Sulfa Antibiotics Rash   Family History  Problem Relation Age of Onset   Kidney disease Father        from uremia   Benign prostatic hyperplasia Father    Hypertension Mother    Colon cancer Paternal Uncle    Clotting disorder Maternal Grandmother        ????   PE: BP 120/80 (BP Location: Right Arm, Patient Position: Sitting, Cuff Size: Normal)   Pulse 67   Ht 5' 0.25" (1.53 m)   Wt 180 lb 12.8 oz (82 kg)   SpO2 99%   BMI 35.02 kg/m  Wt Readings from Last 3 Encounters:  07/27/22 180 lb 12.8 oz (82 kg)  07/21/22 177 lb (80.3 kg)  05/22/22 184 lb (83.5 kg)   Constitutional: overweight, in NAD Eyes:  EOMI, no exophthalmos ENT: no neck masses, no cervical lymphadenopathy Cardiovascular: RRR, No RG, + 1/6 SEM Respiratory: CTA B Musculoskeletal: no deformities Skin:no rashes Neurological: no  tremor with outstretched hands  ASSESSMENT: 1. Hypothyroidism  PLAN:  1. Patient with longstanding hypothyroidism, on combination levothyroxine + liothyronine regimen.  She mentions memory loss when the TSH was in the upper range of the normal interval. - latest thyroid labs reviewed with pt. >> normal: Lab Results  Component Value Date   TSH 1.12 07/15/2021  - she continues on LT4 112 mcg + LT3 mcg daily (  equivalent dose of 132 mcg LT4 daily) - pt feels good on this dose, with persistent disequilibrium, especially when changing position and turning - we discussed about taking the thyroid hormone every day, with water, >30 minutes before breakfast, separated by >4 hours from acid reflux medications, calcium, iron, multivitamins. Pt. is taking it correctly. - will check thyroid tests today: TSH and fT4 - If labs are abnormal, she will need to return for repeat TFTs in 1.5 months - OTW, I will see her back in a year  Needs refills.  Component     Latest Ref Rng 07/27/2022  TSH     0.35 - 5.50 uIU/mL 1.19   T4,Free(Direct)     0.60 - 1.60 ng/dL 4.09   Thyroid tests are normal.  Carlus Pavlov, MD PhD [facility]

## 2022-07-27 NOTE — Patient Instructions (Signed)
Please continue  Levothyroxine 112 mcg and Liothyronine 5 mcg daily. ? ?Take the thyroid hormone every day, with water, at least 30 minutes before breakfast, separated by at least 4 hours from: ?- acid reflux medications ?- calcium ?- iron ?- multivitamins ? ?Please stop at the lab. ? ?Please return in 1 year. ? ?

## 2022-07-28 LAB — T4, FREE: Free T4: 1.39 ng/dL (ref 0.60–1.60)

## 2022-07-28 LAB — TSH: TSH: 1.19 u[IU]/mL (ref 0.35–5.50)

## 2022-07-28 MED ORDER — LEVOTHYROXINE SODIUM 112 MCG PO TABS: 112.0000 ug | ORAL_TABLET | Freq: Every day | ORAL | 3 refills | Status: DC

## 2022-07-28 MED ORDER — LIOTHYRONINE SODIUM 5 MCG PO TABS: ORAL_TABLET | ORAL | 3 refills | Status: DC

## 2022-08-01 ENCOUNTER — Ambulatory Visit: Payer: Medicare PPO | Admitting: Psychology

## 2022-08-01 DIAGNOSIS — E039 Hypothyroidism, unspecified: Secondary | ICD-10-CM | POA: Diagnosis not present

## 2022-08-01 DIAGNOSIS — I10 Essential (primary) hypertension: Secondary | ICD-10-CM | POA: Diagnosis not present

## 2022-08-01 DIAGNOSIS — G4733 Obstructive sleep apnea (adult) (pediatric): Secondary | ICD-10-CM | POA: Diagnosis not present

## 2022-08-03 ENCOUNTER — Ambulatory Visit: Payer: Medicare PPO | Admitting: Internal Medicine

## 2022-08-08 ENCOUNTER — Ambulatory Visit (INDEPENDENT_AMBULATORY_CARE_PROVIDER_SITE_OTHER): Payer: Medicare PPO | Admitting: Psychology

## 2022-08-08 DIAGNOSIS — F411 Generalized anxiety disorder: Secondary | ICD-10-CM

## 2022-08-08 NOTE — Progress Notes (Signed)
Date: 08/08/2022  Diagnosis G47.00 (Insomnia disorder) [n/a]  300.02 (Generalized anxiety disorder) [n/a]  Symptoms Complains of difficulty remaining asleep. (Status: maintained) -- No Description Entered  Excessive and/or unrealistic worry that is difficult to control occurring more days than not for at least 6 months about a number of events or activities. (Status: maintained) -- No Description Entered  Medication Status compliance  Safety none  If Suicidal or Homicidal State Action Taken: unspecified  Current Risk: low Medications unspecified Objectives Related Problem: Resolve the core conflict that is the source of anxiety. Description: Describe situations, thoughts, feelings, and actions associated with anxieties and worries, their impact on functioning, and attempts to resolve them. Target Date: 2023-02-13 Frequency: Daily Modality: individual Progress: 80%  Related Problem: Resolve the core conflict that is the source of anxiety. Description: Learn and implement calming skills to reduce overall anxiety and manage anxiety symptoms. Target Date: 2023-02-13 Frequency: Daily Modality: individual Progress: 80%  Related Problem: Resolve the core conflict that is the source of anxiety. Description: Learn and implement problem-solving strategies for realistically addressing worries. Target Date: 2023-02-13 Frequency: Daily Modality: individual Progress: 80%  Related Problem: Resolve the core conflict that is the source of anxiety. Description: Maintain involvement in work, family, and social activities. Target Date: 2023-02-13 Frequency: Daily Modality: individual Progress: 80%  Related Problem: Restore restful sleep pattern. Description: Describe the history and details of sleep pattern. Target Date: 2023-02-13 Frequency: Daily Modality: individual Progress: 85%    Client Response full compliance   Service Location Location, [address] [place], [place] [address]  Service Code cpt [id number] P Self-monitoring  Lifestyle change (exercise, nutrition)  Self care activities  Distress tolerance skill  Identify/label emotions  Identify automatic thoughts  Facilitate problem solving  Normalize/Reframe  Behavioral activation plan  Validate/empathize  Comments  Dx.: Generalized Anxiety (F41.1)  Meds: No psychotropics Goals: [name] is seeking to develop a more satisfying life-style that takes into account her current physical condition. Goal date 11-04-20. States that the current situation in the world (politics and Covid 19) is making her very anxious. She is "obsessed" with the news (social media) and remains in a constant state of stress. She wants to learn to be less reactive. Will initiate behaviorally oriented individual therapy to address symptoms. Goal date is 10-04-21. While patient has realized some improvement in level of anxiety, she still expresses considerable stress and anxiety. Continues to need better balance in life. Revised date is 12-24.  Patient agrees to a Film/video editor session. She is at home and I am at my home office.   [name] says she bought her computer from [employer] and had a terrible time because it was missing a part. She got extremely agitated and said she "felt like blowing up the store". She was asked to leave the store, but her sister went back and got the part. She admits that she got out of control. We talked about "letting go" and not holding on to her rage. Also, talked about trying to control herself when she gets agitated.                                                                                     `  Garrel Ridgel, PhD  Time: 3:15p-4:00p 45 minutes

## 2022-08-11 DIAGNOSIS — G4733 Obstructive sleep apnea (adult) (pediatric): Secondary | ICD-10-CM | POA: Diagnosis not present

## 2022-08-15 ENCOUNTER — Ambulatory Visit: Payer: Medicare PPO | Admitting: Psychology

## 2022-08-15 DIAGNOSIS — G4733 Obstructive sleep apnea (adult) (pediatric): Secondary | ICD-10-CM | POA: Diagnosis not present

## 2022-08-17 DIAGNOSIS — G4730 Sleep apnea, unspecified: Secondary | ICD-10-CM | POA: Diagnosis not present

## 2022-08-17 DIAGNOSIS — M8580 Other specified disorders of bone density and structure, unspecified site: Secondary | ICD-10-CM | POA: Diagnosis not present

## 2022-08-17 DIAGNOSIS — M1990 Unspecified osteoarthritis, unspecified site: Secondary | ICD-10-CM | POA: Diagnosis not present

## 2022-08-17 DIAGNOSIS — F419 Anxiety disorder, unspecified: Secondary | ICD-10-CM | POA: Diagnosis not present

## 2022-08-17 DIAGNOSIS — E039 Hypothyroidism, unspecified: Secondary | ICD-10-CM | POA: Diagnosis not present

## 2022-08-17 DIAGNOSIS — I10 Essential (primary) hypertension: Secondary | ICD-10-CM | POA: Diagnosis not present

## 2022-08-17 DIAGNOSIS — R011 Cardiac murmur, unspecified: Secondary | ICD-10-CM | POA: Diagnosis not present

## 2022-08-17 DIAGNOSIS — E669 Obesity, unspecified: Secondary | ICD-10-CM | POA: Diagnosis not present

## 2022-08-17 DIAGNOSIS — R32 Unspecified urinary incontinence: Secondary | ICD-10-CM | POA: Diagnosis not present

## 2022-08-21 ENCOUNTER — Encounter: Payer: Self-pay | Admitting: Neurology

## 2022-08-22 ENCOUNTER — Ambulatory Visit (INDEPENDENT_AMBULATORY_CARE_PROVIDER_SITE_OTHER): Payer: Medicare PPO | Admitting: Psychology

## 2022-08-22 DIAGNOSIS — F411 Generalized anxiety disorder: Secondary | ICD-10-CM

## 2022-08-22 NOTE — Progress Notes (Signed)
Date: 08/22/2022  Diagnosis G47.00 (Insomnia disorder) [n/a]  300.02 (Generalized anxiety disorder) [n/a]  Symptoms Complains of difficulty remaining asleep. (Status: maintained) -- No Description Entered  Excessive and/or unrealistic worry that is difficult to control occurring more days than not for at least 6 months about a number of events or activities. (Status: maintained) -- No Description Entered  Medication Status compliance  Safety none  If Suicidal or Homicidal State Action Taken: unspecified  Current Risk: low Medications unspecified Objectives Related Problem: Resolve the core conflict that is the source of anxiety. Description: Describe situations, thoughts, feelings, and actions associated with anxieties and worries, their impact on functioning, and attempts to resolve them. Target Date: 2023-02-13 Frequency: Daily Modality: individual Progress: 80%  Related Problem: Resolve the core conflict that is the source of anxiety. Description: Learn and implement calming skills to reduce overall anxiety and manage anxiety symptoms. Target Date: 2023-02-13 Frequency: Daily Modality: individual Progress: 80%  Related Problem: Resolve the core conflict that is the source of anxiety. Description: Learn and implement problem-solving strategies for realistically addressing worries. Target Date: 2023-02-13 Frequency: Daily Modality: individual Progress: 80%  Related Problem: Resolve the core conflict that is the source of anxiety. Description: Maintain involvement in work, family, and social activities. Target Date: 2023-02-13 Frequency: Daily Modality: individual Progress: 80%  Related Problem: Restore restful sleep pattern. Description: Describe the history and details of sleep pattern. Target Date: 2023-02-13 Frequency: Daily Modality: individual Progress: 85%    Client  Response full compliance  Service Location Location, [address] [place], [place] [address]  Service Code cpt [phone] P Self-monitoring  Lifestyle change (exercise, nutrition)  Self care activities  Distress tolerance skill  Identify/label emotions  Identify automatic thoughts  Facilitate problem solving  Normalize/Reframe  Behavioral activation plan  Validate/empathize  Comments  Dx.: Generalized Anxiety (F41.1)  Meds: No psychotropics Goals: [name] is seeking to develop a more satisfying life-style that takes into account her current physical condition. Goal date 11-04-20. States that the current situation in the world (politics and Covid 19) is making her very anxious. She is "obsessed" with the news (social media) and remains in a constant state of stress. She wants to learn to be less reactive. Will initiate behaviorally oriented individual therapy to address symptoms. Goal date is 10-04-21. While patient has realized some improvement in level of anxiety, she still expresses considerable stress and anxiety. Continues to need better balance in life. Revised date is 12-24.  Patient agrees to a Engineering geologist video session and understands the limitations of this platform. She is at home and I am at my home office.   [name] says she is having trouble with her C-Pap and is therefore very tired. She has been using C-Pap for 20 plus years and never had a problem until now. She is frustrated that she is not getting any response from the company, but will keep trying. She talked about many of her social/politcal frustrations. As in the past, we talked about balance and the best way to channel her frustrations.                                                                                        `  [name], PhD  Time: 3:10p-4:00p 50 minutes

## 2022-08-29 ENCOUNTER — Ambulatory Visit (INDEPENDENT_AMBULATORY_CARE_PROVIDER_SITE_OTHER): Payer: Medicare PPO | Admitting: Psychology

## 2022-08-29 DIAGNOSIS — F411 Generalized anxiety disorder: Secondary | ICD-10-CM

## 2022-08-29 NOTE — Progress Notes (Signed)
Date: 08/29/2022  Diagnosis G47.00 (Insomnia disorder) [n/a]  300.02 (Generalized anxiety disorder) [n/a]  Symptoms Complains of difficulty remaining asleep. (Status: maintained) -- No Description Entered  Excessive and/or unrealistic worry that is difficult to control occurring more days than not for at least 6 months about a number of events or activities. (Status: maintained) -- No Description Entered  Medication Status compliance  Safety none  If Suicidal or Homicidal State Action Taken: unspecified  Current Risk: low Medications unspecified Objectives Related Problem: Resolve the core conflict that is the source of anxiety. Description: Describe situations, thoughts, feelings, and actions associated with anxieties and worries, their impact on functioning, and attempts to resolve them. Target Date: 2023-02-13 Frequency: Daily Modality: individual Progress: 80%  Related Problem: Resolve the core conflict that is the source of anxiety. Description: Learn and implement calming skills to reduce overall anxiety and manage anxiety symptoms. Target Date: 2023-02-13 Frequency: Daily Modality: individual Progress: 80%  Related Problem: Resolve the core conflict that is the source of anxiety. Description: Learn and implement problem-solving strategies for realistically addressing worries. Target Date: 2023-02-13 Frequency: Daily Modality: individual Progress: 80%  Related Problem: Resolve the core conflict that is the source of anxiety. Description: Maintain involvement in work, family, and social activities. Target Date: 2023-02-13 Frequency: Daily Modality: individual Progress: 80%  Related Problem: Restore restful sleep pattern. Description: Describe the history and details of sleep pattern. Target Date: 2023-02-13 Frequency: Daily Modality:  individual Progress: 85%    Client Response full compliance  Service Location Location, [address] [place], [place] [address]  Service Code cpt [phone] P Self-monitoring  Lifestyle change (exercise, nutrition)  Self care activities  Distress tolerance skill  Identify/label emotions  Identify automatic thoughts  Facilitate problem solving  Normalize/Reframe  Behavioral activation plan  Validate/empathize  Comments  Dx.: Generalized Anxiety (F41.1)  Meds: No psychotropics Goals: [name] is seeking to develop a more satisfying life-style that takes into account her current physical condition. Goal date 11-04-20. States that the current situation in the world (politics and Covid 19) is making her very anxious. She is "obsessed" with the news (social media) and remains in a constant state of stress. She wants to learn to be less reactive. Will initiate behaviorally oriented individual therapy to address symptoms. Goal date is 10-04-21. While patient has realized some improvement in level of anxiety, she still expresses considerable stress and anxiety. Continues to need better balance in life. Revised date is 12-24.  Patient agrees to a Engineering geologist video session and understands the limitations of this platform. She is at home and I am at my home office.   [name] says she has not been sleeping well. She did resolve the Cpap issue and hope that it helps her sleep. She talked about her ongoing political fears. She is now going to consider volunteering. She is still involved with her poetry group. She is not as active, but still participating on a somewhat regular cadence. She reports that she and her sister are getting along better than before. Moods are relatively stable and she states depressive symptoms are minimal. She does have a number of "irritations".                                                                                            `  Joy Morel, PhD  Time: 3:10p-4:00p 50 minutes

## 2022-09-05 ENCOUNTER — Ambulatory Visit: Payer: Medicare PPO | Admitting: Psychology

## 2022-09-06 ENCOUNTER — Ambulatory Visit (INDEPENDENT_AMBULATORY_CARE_PROVIDER_SITE_OTHER): Payer: Medicare PPO | Admitting: Psychology

## 2022-09-06 DIAGNOSIS — F411 Generalized anxiety disorder: Secondary | ICD-10-CM | POA: Diagnosis not present

## 2022-09-06 NOTE — Progress Notes (Signed)
Date: 09/06/2022  Diagnosis G47.00 (Insomnia disorder) [n/a]  300.02 (Generalized anxiety disorder) [n/a]  Symptoms Complains of difficulty remaining asleep. (Status: maintained) -- No Description Entered  Excessive and/or unrealistic worry that is difficult to control occurring more days than not for at least 6 months about a number of events or activities. (Status: maintained) -- No Description Entered  Medication Status compliance  Safety none  If Suicidal or Homicidal State Action Taken: unspecified  Current Risk: low Medications unspecified Objectives Related Problem: Resolve the core conflict that is the source of anxiety. Description: Describe situations, thoughts, feelings, and actions associated with anxieties and worries, their impact on functioning, and attempts to resolve them. Target Date: 2023-02-13 Frequency: Daily Modality: individual Progress: 80%  Related Problem: Resolve the core conflict that is the source of anxiety. Description: Learn and implement calming skills to reduce overall anxiety and manage anxiety symptoms. Target Date: 2023-02-13 Frequency: Daily Modality: individual Progress: 80%  Related Problem: Resolve the core conflict that is the source of anxiety. Description: Learn and implement problem-solving strategies for realistically addressing worries. Target Date: 2023-02-13 Frequency: Daily Modality: individual Progress: 80%  Related Problem: Resolve the core conflict that is the source of anxiety. Description: Maintain involvement in work, family, and social activities. Target Date: 2023-02-13 Frequency: Daily Modality: individual Progress: 80%  Related Problem: Restore restful sleep pattern. Description: Describe the history and details of sleep pattern. Target Date: 2023-02-13 Frequency:  Daily Modality: individual Progress: 85%    Client Response full compliance  Service Location Location, [address] [place], [place] [address]  Service Code cpt [id number] P Self-monitoring  Lifestyle change (exercise, nutrition)  Self care activities  Distress tolerance skill  Identify/label emotions  Identify automatic thoughts  Facilitate problem solving  Normalize/Reframe  Behavioral activation plan  Validate/empathize  Comments  Dx.: Generalized Anxiety (F41.1)  Meds: No psychotropics Goals: Bennye is seeking to develop a more satisfying life-style that takes into account her current physical condition. Goal date 11-04-20. States that the current situation in the world (politics and Covid 19) is making her very anxious. She is "obsessed" with the news (social media) and remains in a constant state of stress. She wants to learn to be less reactive. Will initiate behaviorally oriented individual therapy to address symptoms. Goal date is 10-04-21. While patient has realized some improvement in level of anxiety, she still expresses considerable stress and anxiety. Continues to need better balance in life. Revised date is 12-24.  Patient agrees to a Engineering geologist video session and understands the limitations of this platform. She is at home and I am at my home office.   Shandiin has been very distressed over the current political situation and supreme court decision. This tends to create both rage and anxiety. She is feeling a little better today, but expresses her fear. She felt compelled in session to share her political beliefs. She is abloe to say that she will channel her passion into volunteering during this election cycle. She says she is energized overall and is able to get more things accomplished.  She is getting out more and is planning to go to her friend's birthday party.                                                                                                `                                                                                                                   Garrel Ridgel, PhD  Time: 3:10p-4:00p 50 minutes

## 2022-09-10 DIAGNOSIS — G4733 Obstructive sleep apnea (adult) (pediatric): Secondary | ICD-10-CM | POA: Diagnosis not present

## 2022-09-11 DIAGNOSIS — G4733 Obstructive sleep apnea (adult) (pediatric): Secondary | ICD-10-CM | POA: Diagnosis not present

## 2022-09-12 ENCOUNTER — Ambulatory Visit (INDEPENDENT_AMBULATORY_CARE_PROVIDER_SITE_OTHER): Payer: Medicare PPO | Admitting: Psychology

## 2022-09-12 DIAGNOSIS — F411 Generalized anxiety disorder: Secondary | ICD-10-CM | POA: Diagnosis not present

## 2022-09-12 DIAGNOSIS — G4700 Insomnia, unspecified: Secondary | ICD-10-CM

## 2022-09-12 NOTE — Progress Notes (Signed)
Date: 09/12/2022  Diagnosis G47.00 (Insomnia disorder) [n/a]  300.02 (Generalized anxiety disorder) [n/a]  Symptoms Complains of difficulty remaining asleep. (Status: maintained) -- No Description Entered  Excessive and/or unrealistic worry that is difficult to control occurring more days than not for at least 6 months about a number of events or activities. (Status: maintained) -- No Description Entered  Medication Status compliance  Safety none  If Suicidal or Homicidal State Action Taken: unspecified  Current Risk: low Medications unspecified Objectives Related Problem: Resolve the core conflict that is the source of anxiety. Description: Describe situations, thoughts, feelings, and actions associated with anxieties and worries, their impact on functioning, and attempts to resolve them. Target Date: 2023-02-13 Frequency: Daily Modality: individual Progress: 80%  Related Problem: Resolve the core conflict that is the source of anxiety. Description: Learn and implement calming skills to reduce overall anxiety and manage anxiety symptoms. Target Date: 2023-02-13 Frequency: Daily Modality: individual Progress: 80%  Related Problem: Resolve the core conflict that is the source of anxiety. Description: Learn and implement problem-solving strategies for realistically addressing worries. Target Date: 2023-02-13 Frequency: Daily Modality: individual Progress: 80%  Related Problem: Resolve the core conflict that is the source of anxiety. Description: Maintain involvement in work, family, and social activities. Target Date: 2023-02-13 Frequency: Daily Modality: individual Progress: 80%  Related Problem: Restore restful sleep pattern. Description: Describe the history and details of sleep pattern. Target  Date: 2023-02-13 Frequency: Daily Modality: individual Progress: 85%    Client Response full compliance  Service Location Location, [address] [place], [place] [address]  Service Code cpt [phone] P Self-monitoring  Lifestyle change (exercise, nutrition)  Self care activities  Distress tolerance skill  Identify/label emotions  Identify automatic thoughts  Facilitate problem solving  Normalize/Reframe  Behavioral activation plan  Validate/empathize  Comments  Dx.: Generalized Anxiety (F41.1)  Meds: No psychotropics Goals: [name] is seeking to develop a more satisfying life-style that takes into account her current physical condition. Goal date 11-04-20. States that the current situation in the world (politics and Covid 19) is making her very anxious. She is "obsessed" with the news (social media) and remains in a constant state of stress. She wants to learn to be less reactive. Will initiate behaviorally oriented individual therapy to address symptoms. Goal date is 10-04-21. While patient has realized some improvement in level of anxiety, she still expresses considerable stress and anxiety. Continues to need better balance in life. Revised date is 12-24.  Patient agrees to a Engineering geologist video session and understands the limitations of this platform. She is at home and I am at my home office.   [name] says that she is very involved with presidential politics. She remains very upset by the current political climate. Supported her getting involved to help channel some of her frustration and anger. She says that she and her sister have a number of plans to get out of the house and go to events. Reports that, other than above  mentioned frustration, her moods have been stable.                                                                                                     `                                                                                                                   Garrel Ridgel, PhD  Time: 3:13p-4:00p 47 minutes

## 2022-09-19 ENCOUNTER — Ambulatory Visit: Payer: Medicare PPO | Admitting: Psychology

## 2022-09-19 DIAGNOSIS — F411 Generalized anxiety disorder: Secondary | ICD-10-CM

## 2022-09-19 NOTE — Progress Notes (Signed)
Date: 09/19/2022  Diagnosis G47.00 (Insomnia disorder) [n/a]  300.02 (Generalized anxiety disorder) [n/a]  Symptoms Complains of difficulty remaining asleep. (Status: maintained) -- No Description Entered  Excessive and/or unrealistic worry that is difficult to control occurring more days than not for at least 6 months about a number of events or activities. (Status: maintained) -- No Description Entered  Medication Status compliance  Safety none  If Suicidal or Homicidal State Action Taken: unspecified  Current Risk: low Medications unspecified Objectives Related Problem: Resolve the core conflict that is the source of anxiety. Description: Describe situations, thoughts, feelings, and actions associated with anxieties and worries, their impact on functioning, and attempts to resolve them. Target Date: 2023-02-13 Frequency: Daily Modality: individual Progress: 80%  Related Problem: Resolve the core conflict that is the source of anxiety. Description: Learn and implement calming skills to reduce overall anxiety and manage anxiety symptoms. Target Date: 2023-02-13 Frequency: Daily Modality: individual Progress: 80%  Related Problem: Resolve the core conflict that is the source of anxiety. Description: Learn and implement problem-solving strategies for realistically addressing worries. Target Date: 2023-02-13 Frequency: Daily Modality: individual Progress: 80%  Related Problem: Resolve the core conflict that is the source of anxiety. Description: Maintain involvement in work, family, and social activities. Target Date: 2023-02-13 Frequency: Daily Modality: individual Progress: 80%  Related Problem: Restore restful sleep pattern. Description: Describe the history and details of sleep pattern. Target Date: 2023-02-13 Frequency: Daily Modality: individual Progress: 85%    Client  Response full compliance  Service Location Location, [address] [place], [place] [address]  Service Code cpt [phone] P Self-monitoring  Lifestyle change (exercise, nutrition)  Self care activities  Distress tolerance skill  Identify/label emotions  Identify automatic thoughts  Facilitate problem solving  Normalize/Reframe  Behavioral activation plan  Validate/empathize  Comments  Dx.: Generalized Anxiety (F41.1)  Meds: No psychotropics Goals: [name] is seeking to develop a more satisfying life-style that takes into account her current physical condition. Goal date 11-04-20. States that the current situation in the world (politics and Covid 19) is making her very anxious. She is "obsessed" with the news (social media) and remains in a constant state of stress. She wants to learn to be less reactive. Will initiate behaviorally oriented individual therapy to address symptoms. Goal date is 10-04-21. While patient has realized some improvement in level of anxiety, she still expresses considerable stress and anxiety. Continues to need better balance in life. Revised date is 12-24.  Patient agrees to a Engineering geologist video session and understands the limitations of this platform. She is at home and I am at my home office.   [name] is trying to be optimistic about the political situation in this country. She is, however, trying to minimize her exposure to television. She does think about leaving country if her candidate for president isn't elected.Trying to be positive, but it is very difficult. She feels it is too hot and dangerous to go out, so she and sister are staying mostly inside and limiting social engagements.                                                                                                        `  Garrel Ridgel, PhD  Time: 3:12p-4:00p 48  minutes

## 2022-09-22 DIAGNOSIS — M179 Osteoarthritis of knee, unspecified: Secondary | ICD-10-CM | POA: Diagnosis not present

## 2022-09-26 ENCOUNTER — Ambulatory Visit (INDEPENDENT_AMBULATORY_CARE_PROVIDER_SITE_OTHER): Payer: Medicare PPO | Admitting: Psychology

## 2022-09-26 DIAGNOSIS — F411 Generalized anxiety disorder: Secondary | ICD-10-CM

## 2022-09-26 NOTE — Progress Notes (Signed)
Date: 09/26/2022  Diagnosis G47.00 (Insomnia disorder) [n/a]  300.02 (Generalized anxiety disorder) [n/a]  Symptoms Complains of difficulty remaining asleep. (Status: maintained) -- No Description Entered  Excessive and/or unrealistic worry that is difficult to control occurring more days than not for at least 6 months about a number of events or activities. (Status: maintained) -- No Description Entered  Medication Status compliance  Safety none  If Suicidal or Homicidal State Action Taken: unspecified  Current Risk: low Medications unspecified Objectives Related Problem: Resolve the core conflict that is the source of anxiety. Description: Describe situations, thoughts, feelings, and actions associated with anxieties and worries, their impact on functioning, and attempts to resolve them. Target Date: 2023-02-13 Frequency: Daily Modality: individual Progress: 80%  Related Problem: Resolve the core conflict that is the source of anxiety. Description: Learn and implement calming skills to reduce overall anxiety and manage anxiety symptoms. Target Date: 2023-02-13 Frequency: Daily Modality: individual Progress: 80%  Related Problem: Resolve the core conflict that is the source of anxiety. Description: Learn and implement problem-solving strategies for realistically addressing worries. Target Date: 2023-02-13 Frequency: Daily Modality: individual Progress: 80%  Related Problem: Resolve the core conflict that is the source of anxiety. Description: Maintain involvement in work, family, and social activities. Target Date: 2023-02-13 Frequency: Daily Modality: individual Progress: 80%  Related Problem: Restore restful sleep pattern. Description: Describe the history and details of sleep pattern. Target Date: 2023-02-13 Frequency: Daily Modality: individual Progress: 85%    Client  Response full compliance  Service Location Location, [address] [place], [place] [address]  Service Code cpt [phone] Self-monitoring  Lifestyle change (exercise, nutrition)  Self care activities  Distress tolerance skill  Identify/label emotions  Identify automatic thoughts  Facilitate problem solving  Normalize/Reframe  Behavioral activation plan  Validate/empathize  Comments  Dx.: Generalized Anxiety (F41.1)  Meds: No psychotropics Goals: [name] is seeking to develop a more satisfying life-style that takes into account her current physical condition. Goal date 11-04-20. States that the current situation in the world (politics and Covid 19) is making her very anxious. She is "obsessed" with the news (social media) and remains in a constant state of stress. She wants to learn to be less reactive. Will initiate behaviorally oriented individual therapy to address symptoms. Goal date is 10-04-21. While patient has realized some improvement in level of anxiety, she still expresses considerable stress and anxiety. Continues to need better balance in life. Revised date is 12-24.  Patient agrees to a Engineering geologist video session and understands the limitations of this platform. She is at home and I am at my home office.   [name] states she has been aggravated by the news and is upset that Jackquline Bosch was treated poorly. She is trying, however, to be optimistic and not get too anxious. We talked about using this opportunity to get involved to channel her emotional energy. [name] says that she and her sister are continuing to be involved in [place] activities. This has been very positive for her in that it is a great distraction and keeps her engaged with other people. Similarly, she is still trying to keep up with her poetry, but politics have been a distraction away from her writing and she says she has  a hard time focusing. Will make an effort this week to write.                                                                                                           `                                                                                                                  Garrel Ridgel, PhD  Time: 3:15p-4:00p 45 minutes                  Garrel Ridgel, PhD

## 2022-10-03 ENCOUNTER — Ambulatory Visit (INDEPENDENT_AMBULATORY_CARE_PROVIDER_SITE_OTHER): Payer: Medicare PPO | Admitting: Psychology

## 2022-10-03 DIAGNOSIS — F411 Generalized anxiety disorder: Secondary | ICD-10-CM | POA: Diagnosis not present

## 2022-10-03 NOTE — Progress Notes (Signed)
Date: 10/03/2022  Diagnosis G47.00 (Insomnia disorder) [n/a]  300.02 (Generalized anxiety disorder) [n/a]  Symptoms Complains of difficulty remaining asleep. (Status: maintained) -- No Description Entered  Excessive and/or unrealistic worry that is difficult to control occurring more days than not for at least 6 months about a number of events or activities. (Status: maintained) -- No Description Entered  Medication Status compliance  Safety none  If Suicidal or Homicidal State Action Taken: unspecified  Current Risk: low Medications unspecified Objectives Related Problem: Resolve the core conflict that is the source of anxiety. Description: Describe situations, thoughts, feelings, and actions associated with anxieties and worries, their impact on functioning, and attempts to resolve them. Target Date: 2023-02-13 Frequency: Daily Modality: individual Progress: 80%  Related Problem: Resolve the core conflict that is the source of anxiety. Description: Learn and implement calming skills to reduce overall anxiety and manage anxiety symptoms. Target Date: 2023-02-13 Frequency: Daily Modality: individual Progress: 80%  Related Problem: Resolve the core conflict that is the source of anxiety. Description: Learn and implement problem-solving strategies for realistically addressing worries. Target Date: 2023-02-13 Frequency: Daily Modality: individual Progress: 80%  Related Problem: Resolve the core conflict that is the source of anxiety. Description: Maintain involvement in work, family, and social activities. Target Date: 2023-02-13 Frequency: Daily Modality: individual Progress: 80%  Related Problem: Restore restful sleep pattern. Description: Describe the history and details of sleep pattern. Target Date: 2023-02-13 Frequency: Daily Modality:  individual Progress: 85%    Client Response full compliance  Service Location Location, [address] [place], [place] [address]  Service Code cpt [phone] Self-monitoring  Lifestyle change (exercise, nutrition)  Self care activities  Distress tolerance skill  Identify/label emotions  Identify automatic thoughts  Facilitate problem solving  Normalize/Reframe  Behavioral activation plan  Validate/empathize  Comments  Dx.: Generalized Anxiety (F41.1)  Meds: No psychotropics Goals: [name] is seeking to develop a more satisfying life-style that takes into account her current physical condition. Goal date 11-04-20. States that the current situation in the world (politics and Covid 19) is making her very anxious. She is "obsessed" with the news (social media) and remains in a constant state of stress. She wants to learn to be less reactive. Will initiate behaviorally oriented individual therapy to address symptoms. Goal date is 10-04-21. While patient has realized some improvement in level of anxiety, she still expresses considerable stress and anxiety. Continues to need better balance in life. Revised date is 12-24.  Patient agrees to a Engineering geologist video session and understands the limitations of this platform. She is at home and I am at my home office.   [name] states she and her sister are trying to make some financial decision regarding their retirement. She is getting energized by the current politics and is volunteering to help her candidates. She is also joining a new poetry group that has a number of available activities. She is trying to remain healthy and eat better. Doesn't feel she is successful with food intake because she is a stress eater. Says that has been a life-long challenge. We discussed plans for how to manage during my vacation. She feels confident that  she will be fine.                                                                                                              `                                                                                                                  Garrel Ridgel, PhD  Time: 3:15p-4:00p 45 minutes                  Garrel Ridgel, PhD

## 2022-10-04 ENCOUNTER — Ambulatory Visit: Payer: Medicare PPO | Admitting: Neurology

## 2022-10-10 ENCOUNTER — Ambulatory Visit: Payer: Medicare PPO | Admitting: Psychology

## 2022-10-11 DIAGNOSIS — G4733 Obstructive sleep apnea (adult) (pediatric): Secondary | ICD-10-CM | POA: Diagnosis not present

## 2022-10-24 ENCOUNTER — Ambulatory Visit (INDEPENDENT_AMBULATORY_CARE_PROVIDER_SITE_OTHER): Payer: Medicare PPO | Admitting: Psychology

## 2022-10-24 DIAGNOSIS — F411 Generalized anxiety disorder: Secondary | ICD-10-CM | POA: Diagnosis not present

## 2022-10-24 NOTE — Progress Notes (Signed)
Date: 10/24/2022  Diagnosis G47.00 (Insomnia disorder) [n/a]  300.02 (Generalized anxiety disorder) [n/a]  Symptoms Complains of difficulty remaining asleep. (Status: maintained) -- No Description Entered  Excessive and/or unrealistic worry that is difficult to control occurring more days than not for at least 6 months about a number of events or activities. (Status: maintained) -- No Description Entered  Medication Status compliance  Safety none  If Suicidal or Homicidal State Action Taken: unspecified  Current Risk: low Medications unspecified Objectives Related Problem: Resolve the core conflict that is the source of anxiety. Description: Describe situations, thoughts, feelings, and actions associated with anxieties and worries, their impact on functioning, and attempts to resolve them. Target Date: 2023-02-13 Frequency: Daily Modality: individual Progress: 80%  Related Problem: Resolve the core conflict that is the source of anxiety. Description: Learn and implement calming skills to reduce overall anxiety and manage anxiety symptoms. Target Date: 2023-02-13 Frequency: Daily Modality: individual Progress: 80%  Related Problem: Resolve the core conflict that is the source of anxiety. Description: Learn and implement problem-solving strategies for realistically addressing worries. Target Date: 2023-02-13 Frequency: Daily Modality: individual Progress: 80%  Related Problem: Resolve the core conflict that is the source of anxiety. Description: Maintain involvement in work, family, and social activities. Target Date: 2023-02-13 Frequency: Daily Modality: individual Progress: 80%  Related Problem: Restore restful sleep pattern. Description: Describe the history and details of sleep pattern. Target Date: 2023-02-13 Frequency:  Daily Modality: individual Progress: 85%    Client Response full compliance  Service Location Location, [address] [place], [place] [address]  Service Code cpt [phone] P Self-monitoring  Lifestyle change (exercise, nutrition)  Self care activities  Distress tolerance skill  Identify/label emotions  Identify automatic thoughts  Facilitate problem solving  Normalize/Reframe  Behavioral activation plan  Validate/empathize  Comments  Dx.: Generalized Anxiety (F41.1)  Meds: No psychotropics Goals: [name] is seeking to develop a more satisfying life-style that takes into account her current physical condition. Goal date 11-04-20. States that the current situation in the world (politics and Covid 19) is making her very anxious. She is "obsessed" with the news (social media) and remains in a constant state of stress. She wants to learn to be less reactive. Will initiate behaviorally oriented individual therapy to address symptoms. Goal date is 10-04-21. While patient has realized some improvement in level of anxiety, she still expresses considerable stress and anxiety. Continues to need better balance in life. Revised date is 12-24.  Patient agrees to a Engineering geologist video session and understands the limitations of this platform. She is at home and I am at my home office.   [name] states she and her sister have both been planning their funeral. This is causing her excessive stress and she says she has been anxious and felt panic. Her anxiety is, in part, related to her financial security. She also has anxiety about death itself and "what it means to be dead". [name] talked about her friend from her previous work (prior to [employer]) and her significant concerns about this person's well-being. They communicate via  Facebook.                                                                                                               `                                                                                                                   Garrel Ridgel, PhD  Time: 3:15p-4:00p 45 minutes                  Garrel Ridgel, PhD

## 2022-10-25 ENCOUNTER — Telehealth: Payer: Self-pay | Admitting: *Deleted

## 2022-10-25 NOTE — Telephone Encounter (Signed)
Pt is on schedule for initial cpap f/u tomorrow. I cannot find her new machine listed in [facility]. I tried to the patient at both numbers but couldn't reach her and there was no VM. I tried to call [facility] but the estimated wait time was 20 minutes. I sent a high priority message to [facility] to see if patient received her new machine. If she hasn't or if she hasn't had it at least 30 days then we need to reschedule her appointment.

## 2022-10-26 ENCOUNTER — Encounter: Payer: Self-pay | Admitting: Neurology

## 2022-10-26 ENCOUNTER — Ambulatory Visit: Payer: Medicare PPO | Admitting: Neurology

## 2022-10-26 VITALS — BP 132/62 | HR 53 | Ht 60.25 in | Wt 179.0 lb

## 2022-10-26 DIAGNOSIS — G4733 Obstructive sleep apnea (adult) (pediatric): Secondary | ICD-10-CM

## 2022-10-26 NOTE — Patient Instructions (Signed)
It was nice to see you again today. I am glad to hear, things are going well with your new CPAP machine. You are compliant with it. You have also fulfilled the insurance-mandated compliance percentage, which is reassuring, so you can get ongoing supplies through your insurance. Please talk to your DME provider about getting replacement supplies on a regular basis. Please be sure to change your filter every month, your mask about every 3 months, hose about every 6 months, humidifier chamber about yearly. Some restrictions are imposed by your insurance carrier with regard to how frequently you can get certain supplies.  Your DME company can provide further details if necessary.   Please continue using your CPAP regularly. While your insurance requires that you use PAP at least 4 hours each night on 70% of the nights, I recommend, that you not skip any nights and use it throughout the night if you can. Getting used to PAP and staying with the treatment long term does take time and patience and discipline. Untreated obstructive sleep apnea when it is moderate to severe can have an adverse impact on cardiovascular health and raise her risk for heart disease, arrhythmias, hypertension, congestive heart failure, stroke and diabetes. Untreated obstructive sleep apnea causes sleep disruption, nonrestorative sleep, and sleep deprivation. This can have an impact on your day to day functioning and cause daytime sleepiness and impairment of cognitive function, memory loss, mood disturbance, and problems focussing. Using PAP regularly can improve these symptoms.  We can see you in 1 year, you can see one of our nurse practitioners as you are stable.

## 2022-10-26 NOTE — Progress Notes (Signed)
Subjective:    Patient ID: [name] is a 77 y.o. female.  HPI    Interim history:   [name] is a 77 year old female with an underlying complex medical history of hypertension, hypothyroidism, postmenopausal bleeding, vitamin D deficiency, allergies, anemia, anxiety, interstitial cystitis, osteoarthritis, and obesity, who presents for follow-up consultation of her obstructive sleep apnea after interim testing and starting treatment with a new CPAP machine.  The patient is unaccompanied today.  I saw her on 05/22/2022 for reevaluation of her sleep apnea.  She had been on CPAP of 8 cm with good compliance and good apnea control.  Her machine was not working properly and she needed new equipment.  She was advised to proceed with a home sleep test for reevaluation.  She had a home sleep test through our office on 06/27/2022 which showed moderate obstructive sleep apnea with an AHI of 29.7/h, O2 nadir 83% with moderate to loud snoring detected.  I prescribed a new CPAP machine for her.  Her set up date was 08/11/2022, she has a ResMed air sense 11 AutoSet machine and DME provider is [facility].  Today, 10/26/2022: I reviewed her CPAP compliance data from 09/25/2022 through 10/24/2022, total of 30 days, during which time she used her machine every night with percent use days greater than 4 hours at 100%, indicating superb compliance, average usage of 7 hours and 8 minutes, residual AHI at goal at 2.9/h, leak acceptable with the 95th percentile at 3.3 L/min on a pressure of 8 cm with EPR of 1.  She reports that she had in the first months of difficulty with the tubing but she received a new tube and it is working well currently.  Sometimes her apnea index goes up to 5 but generally she is doing well and she is using nasal pillows for her mask without problems.  Sometimes her nose gets sore from the interface but other than that things are going well.  She was recently hospitalized some 3 months ago for  hypertensive crisis in May 2024.  She did get an interim home sleep test She had a brain MRI with and without contrast on 07/21/2022 and I reviewed the results:   IMPRESSION: 1. No acute intracranial abnormality. 2. 2.3 cm meningioma at the right middle cranial fossa. Two additional 1 cm meningiomas overlying the right frontal convexity as above. No associated edema or mass effect. 3. Otherwise normal brain MRI for age.  She has not had any sudden onset of one-sided weakness or numbness or droopy face, has had facial tingling at times.  Previously:   05/22/22: (She) was previously diagnosed with obstructive sleep apnea and has been on PAP therapy.  Her Epworth sleepiness score is 9 out of 24, fatigue severity score is 29 out of 63.  She has trouble maintaining sleep, she has not tried any over-the-counter medication and would like to avoid adding any medication.  She is divorced, she lives with her younger sister.  She has no children.  She is compliant with her CPAP, it is not running properly as the water chamber is jammed into it and does not come out but essentially she can still use the machine, she should be eligible for new equipment.   Her bedtime is generally between 9 and 10 PM and rise time varies, depends on how well she slept.  She has nocturia about 2-3 times per average night.  Her weight has been fluctuating but more or less has kept the same.  She drinks caffeine in the form of coffee, 1 cup in the morning and occasional decaf coffee during the day.  She uses a nasal pillows interface.   I had evaluated her several years ago at the request of her primary care nurse practitioner at the time.  She was originally diagnosed with obstructive sleep apnea several years prior.  Her baseline sleep study from 01/11/2016 showed an AHI of 12.4/h, REM AHI of 41.3/h, supine AHI of 22.3/h, O2 nadir 84%.  She had a CPAP titration study on 02/17/2016.  I was able to review her CPAP compliance data from  her machine, she has a ResMed air sense 10 AutoSet machine.  Her set up date was 04/06/2016.   During the last month between 04/22/2022 and 05/21/2022, she used her machine 30 out of 30 days with percent use days greater than 4 hours at 100%, average usage of 7 hours and 22 minutes, average AHI 3.7/h, leak on the low side with the 95th percentile at 0.9 L/min on a pressure of 8 cm with EPR of 1.     06/26/16: 77 year old right-handed woman with an underlying medical history of hypertension, osteoarthritis, hypothyroidism, anxiety, and obesity, who presents for follow-up consultation of her obstructive sleep apnea, after recent sleep study testing. The patient is unaccompanied today. I first met her on 11/24/2015 at the request of her primary care provider, at which time she reported a prior diagnosis of OSA. She needed reevaluation. She had a CPAP machine. I invited her for a sleep study. She had a baseline sleep study, followed by a CPAP titration study. Her baseline sleep study from 01/11/2016 showed a sleep efficiency of only 42.8%, sleep latency was 146 minutes which is markedly delayed, REM latency was also delayed at 189.5 minutes. She had an increased percentage of stage I sleep, and a decreased percentage of REM sleep. Total AHI was 12.4 per hour, REM AHI was 41.3 per hour, supine AHI was 22.3 per hour. Average oxygen saturation was 98%, nadir was 84%. She had mild PLMS with an index of 29.5 per hour and minimal arousals. Based on her medical history and test results and sleep related complaints I invited her for a full night CPAP titration study which she had on 02/17/2016. Sleep efficiency was only 22%, sleep latency was 128.5 minutes, REM was absent. She had an increased percentage of slow-wave sleep, an increased percentage of stage I sleep. CPAP was titrated from 5 cm to 6 cm. She was fitted with a small nasal mask. She had no significant PLMS. I did prescribe CPAP therapy at a pressure of 6 cm based on  her test results which were limited.   I reviewed her CPAP compliance data from 05/26/2016 through 06/24/2016 which is a total of 30 days, during which time she used her machine every night with percent used days greater than 4 hours at 100%, indicating superb compliance with an average usage of 7 hours and 31 minutes, residual AHI borderline at 6 per hour, leak low with the 95th percentile at 1.8 L/m on a pressure of 6 cm with EPR of 2. She reports that she has trouble maintaining sleep. She usually falls asleep okay. She tries to keep a schedule. She works from home. She has no trouble using this new machine but did not dislike her old machine. She admits that she has trouble adapting to something new. Sometimes she wakes up after 6 hours of sleep and cannot go back to sleep. It used  to be where she woke up 2 or 3 hours after falling asleep and while this is better she would like to be able to sleep at least 7 hours. She does not like to take medication for sleep. She can drink some milk at times and goes back to sleep after that. She reports no significant change in how she feels. She did not notice a big change in the pressure. She reports that at the time of her second sleep study she had a cough. She then had a upper respiratory infection shortly there after. She feels better in that regard, she has some allergy symptoms which flareup from time to time.     11/24/2015: She was previously diagnosed with obstructive sleep apnea years ago and placed on CPAP therapy. Prior sleep study results are not available for my review. She has been on CPAP therapy. She has an older CPAP machine, ResMed S8 Elite, and I reviewed her CPAP compliance data from 05/23/2015 through 11/18/2015 which is a total of 180 days during which time she used her machine every night with percent used days greater than 4 hours at 99%, indicating superb compliance, with an average usage of 7 hours and 36 minutes, residual AHI borderline at  5.2 per hour, leak acceptable with the 95th percentile at 16.8 L/m on a pressure of 11 cm. She reports daytime somnolence and her Epworth sleepiness score is 14 out of 24 today, her fatigue score is 58 out of 63. She also reports difficulty maintaining sleep especially after she goes to the bathroom at night. I reviewed your office note from 11/16/2015. She reports significant anxiety especially at night and especially in the middle of the night when she cannot go back to sleep. A lot of her anxiety seems to surround her work situation. She says she has always been an anxious person. She is single. She has no children. She generally lives alone in her home but her sister has been staying with her a lot. She has no other siblings. Sister does not have sleep apnea. Patient has been working through her anxiety with relaxation, meditation, drinking chamomile tea which often is a hit or miss. She would not want to consider medication for anxiety she states. She is a nonsmoker, drinks alcohol infrequently, drinks caffeine in the form of coffee, one cup in the morning and generally speaking noncaffeine tea one or 2 cups per day, not much water, maybe up to 3 cups per day. Her bedtime is around 10 PM and while she does not have difficulty falling asleep she will wake up in the early morning hours and usually goes to bathroom once or twice per night on average, sometimes more, and has difficulty going back to sleep. She does take a nap during the day when she is sedentary, typically no planned naps. She denies restless leg symptoms but has noted twitching in her sleep and leg movements but while it used to be significant enough to disheveled her sheets, does not seem to do that any longer. She denies morning headaches. Wake up time is around 6 AM. 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 When she cannot go back to sleep in the middle of the night, she starts using her computer or eye pad  and goes on twitch her and starts worrying about politics and her job.      Her Past Medical History Is Significant For: Past Medical History:  Diagnosis Date  Abnormal perimenopausal bleeding 03/07/2003   Allergy    Anemia    Anxiety    Arthritis    Dysuria 03/06/2002   Endometrial polyp 03/06/2004   Environmental allergies    Fibroid 02/19/2003   H/O varicella    History of measles, mumps, or rubella    Hx of colonic polyps 12/15/2010   Hypertension 07/19/2010   echo for pre-op EF 55%  normal left wall thickness LA mildly dialated with mitral and tricuspid regurgatation   Hypothyroidism    Interstitial cystitis    Menopausal symptoms 03/06/2002   Obesity    Osteoarthritis    of knees   Osteopenia 02/04/2012   -1.8 T score right femur neck   PMB (postmenopausal bleeding) 06/21/2010   Sleep apnea    Urinary incontinence 03/06/2009   Vitamin D deficiency    history of    Her Past Surgical History Is Significant For: Past Surgical History:  Procedure Laterality Date   COLONOSCOPY  12/15/10   HYSTEROSCOPY  2006 and May 2012   for fibroids, endometrial polyps   MOUTH SURGERY     MYOMECTOMY  1974    Her Family History Is Significant For: Family History  Problem Relation Age of Onset   Kidney disease Father        from uremia   Benign prostatic hyperplasia Father    Hypertension Mother    Colon cancer Paternal Uncle    Clotting disorder Maternal Grandmother        ????    Her Social History Is Significant For: Social History   Socioeconomic History   Marital status: Divorced    Spouse name: n/a   Number of children: 0   Years of education: college   Highest education level: Not on file  Occupational History   Not on file  Tobacco Use   Smoking status: Never   Smokeless tobacco: Never  Substance and Sexual Activity   Alcohol use: Yes    Comment: very rare, once in awhile a glass of wine   Drug use: No   Sexual activity: Not Currently    Birth  control/protection: Abstinence  Other Topics Concern   Not on file  Social History Narrative   Lives alone.   Sister stays with her frequently.   Participates in [employer] exercise program. - update 10/26/2022 no longer attends    Social Determinants of Health   Financial Resource Strain: Not on file  Food Insecurity: Not on file  Transportation Needs: Not on file  Physical Activity: Not on file  Stress: Not on file  Social Connections: Not on file    Her Allergies Are:  Allergies  Allergen Reactions   Bactrim [Sulfamethoxazole-Trimethoprim]    Epinephrine     shakey   Erythromycin    Sulfa Antibiotics Rash  :   Her Current Medications Are:  Outpatient Encounter Medications as of 10/26/2022  Medication Sig   Ascorbic Acid (VITAMIN C) 1000 MG tablet Take 1,000 mg by mouth daily. One tablet a day   benazepril-hydrochlorthiazide (LOTENSIN HCT) 20-12.5 MG tablet Take 2 tablets by mouth daily.   cholecalciferol (VITAMIN D3) 25 MCG (1000 UNIT) tablet Take 1,000 Units by mouth daily.   hydrALAZINE (APRESOLINE) 50 MG tablet Take 1 tablet (50 mg total) by mouth 3 (three) times daily with meals.   levothyroxine (SYNTHROID) 112 MCG tablet Take 1 tablet (112 mcg total) by mouth daily before breakfast.   liothyronine (CYTOMEL) 5 MCG tablet Take 1 tablet by mouth before  breakfast   metoprolol succinate (TOPROL-XL) 25 MG 24 hr tablet TAKE 1 TABLET(25 MG) BY MOUTH DAILY   [DISCONTINUED] ondansetron (ZOFRAN) 4 MG tablet Take 1 tablet (4 mg total) by mouth every 6 (six) hours as needed for nausea or vomiting. (Patient not taking: Reported on 10/26/2022)   No facility-administered encounter medications on file as of 10/26/2022.  :  Review of Systems:  Out of a complete 14 point review of systems, all are reviewed and negative with the exception of these symptoms as listed below:  Review of Systems  Neurological:        Patient is here alone for initial follow-up of new cpap machine.  She states she had some trouble with initially, for about a month, until [facility] fixed it. She she states she has been using it for about the last 2 months and as long as she uses the extra "tube" they gave her, it works out. Patient wants to mention that sometimes she gets tingling in the left side of her face, arm, or leg. It is sporadic, lasts maybe a minute or so. She states she has been to the hospital recently for HBP and she was checked for stroke but states it was negative.     Objective:  Neurological Exam  Physical Exam Physical Examination:   Vitals:   10/26/22 1056  BP: 132/62  Pulse: (!) 53  SpO2: 99%    General Examination: The patient is a very pleasant 77 y.o. female in no acute distress. She appears well-developed and well-nourished and well groomed.   HEENT: Normocephalic, atraumatic, pupils are equal, round and reactive to light, extraocular tracking is good without limitation to gaze excursion or nystagmus noted. Hearing is grossly intact. Face is symmetric with normal facial animation. Speech is clear with no dysarthria noted. There is no hypophonia. There is no lip, neck/head, jaw or voice tremor. Neck is supple with full range of passive and active motion. There are no carotid bruits on auscultation. Oropharynx exam reveals: Mild to moderate mouth dryness, adequate dental hygiene and moderate airway crowding. Tongue protrudes centrally and palate elevates symmetrically.   Chest: Clear to auscultation without wheezing, rhonchi or crackles noted.   Heart: S1+S2+0, regular and normal without murmurs, rubs or gallops noted.    Abdomen: Soft, non-tender and non-distended.   Extremities: There is trace swelling noted in the distal lower extremities bilaterally.    Skin: Warm and dry without trophic changes noted.    Musculoskeletal: exam reveals no obvious joint deformities.    Neurologically:  Mental status: The patient is awake, alert and oriented in all 4 spheres.  Her immediate and remote memory, attention, language skills and fund of knowledge are appropriate. There is no evidence of aphasia, agnosia, apraxia or anomia. Speech is clear with normal prosody and enunciation. Thought process is linear. Mood is normal and affect is normal.  Cranial nerves II - XII are as described above under HEENT exam.  Motor exam: Normal bulk, strength and tone is noted. There is no obvious action or resting tremor.  Fine motor skills and coordination: grossly intact.  Cerebellar testing: No dysmetria or intention tremor. There is no truncal or gait ataxia.  Sensory exam: intact to light touch in the upper and lower extremities.  Gait, station and balance: She stands easily. No veering to one side is noted. No leaning to one side is noted. Posture is age-appropriate and stance is narrow based. Gait shows normal stride length and normal pace. No problems  turning are noted.    Assessment and Plan:  In summary, [name] is a very pleasant 77 year old female with an underlying complex medical history of hypertension, hypothyroidism, postmenopausal bleeding, vitamin D deficiency, allergies, anemia, anxiety, interstitial cystitis, osteoarthritis, and obesity, who presents for follow-up consultation of her obstructive sleep apnea after interim testing and starting treatment with a new CPAP machine.   She had a home sleep test through our office on 06/27/2022 which showed moderate obstructive sleep apnea with an AHI of 29.7/h, O2 nadir 83% with moderate to loud snoring detected.  I prescribed a new CPAP machine for her.  Her set up date was 08/11/2022, she has a ResMed air sense 11 AutoSet machine and DME provider is [facility]. She is compliant with treatment with good apnea control and good tolerance of the interface, low leak from the mask. She carries a long standing diagnosis of obstructive sleep apnea and had evaluation through our office in 2017.  At this juncture, she is advised  to continue with her current CPAP and with ongoing full compliance.  She is advised to follow-up in this office to see one of our nurse practitioners routinely in 1 year.  She is advised to follow-up closely and regularly with her other providers including PCP.  We can offer her a video visit for the next appointment as well, she would like to consider this.  I answered all her questions today and she was in agreement with our plan.

## 2022-10-31 ENCOUNTER — Ambulatory Visit (INDEPENDENT_AMBULATORY_CARE_PROVIDER_SITE_OTHER): Payer: Medicare PPO | Admitting: Psychology

## 2022-10-31 DIAGNOSIS — F411 Generalized anxiety disorder: Secondary | ICD-10-CM

## 2022-10-31 NOTE — Progress Notes (Signed)
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 (Status: maintained) -- No Description Entered  Excessive and/or unrealistic worry that is difficult to control occurring more days than not for at least 6 months about a number of events or activities. (Status: maintained) -- No Description Entered  Medication Status compliance  Safety none  If Suicidal or Homicidal State Action Taken: unspecified  Current Risk: low Medications unspecified Objectives Related Problem: Resolve the core conflict that is the source of anxiety. Description: Describe situations, thoughts, feelings, and actions associated with anxieties and worries, their impact on functioning, and attempts to resolve them. Target Date: 2023-02-13 Frequency: Daily Modality: individual Progress: 80%  Related Problem: Resolve the core conflict that is the source of anxiety. Description: Learn and implement calming skills to reduce overall anxiety and manage anxiety symptoms. Target Date: 2023-02-13 Frequency: Daily Modality: individual Progress: 80%  Related Problem: Resolve the core conflict that is the source of anxiety. Description: Learn and implement problem-solving strategies for realistically addressing worries. Target Date: 2023-02-13 Frequency: Daily Modality: individual Progress: 80%  Related Problem: Resolve the core conflict that is the source of anxiety. Description: Maintain involvement in work, family, and social activities. Target Date: 2023-02-13 Frequency: Daily Modality: individual Progress: 80%  Related Problem: Restore restful sleep pattern. Description: Describe the history and details of sleep pattern. Target  Date: 2023-02-13 Frequency: Daily Modality: individual Progress: 85%    Client Response full compliance  Service Location Location, [address] [place], [place] [address]  Service Code cpt 2720091460 P Self-monitoring  Lifestyle change (exercise, nutrition)  Self care activities  Distress tolerance skill  Identify/label emotions  Identify automatic thoughts  Facilitate problem solving  Normalize/Reframe  Behavioral activation plan  Validate/empathize  Comments  Dx.: Generalized Anxiety (F41.1)  Meds: No psychotropics Goals: Eesha is seeking to develop a more satisfying life-style that takes into account her current physical condition. Goal date 11-04-20. States that the current situation in the world (politics and Covid 19) is making her very anxious. She is "obsessed" with the news (social media) and remains in a constant state of stress. She wants to learn to be less reactive. Will initiate behaviorally oriented individual therapy to address symptoms. Goal date is 10-04-21. While patient has realized some improvement in level of anxiety, she still expresses considerable stress and anxiety. Continues to need better balance in life. Revised date is 12-24.  Patient agrees to a Engineering geologist video session and understands the limitations of this platform. She is at home and I am at my home office.   [place] states that she is feeling better, in part due to the shift in the political climate. She discussed some of her frustration regarding her lack of focus which makes it difficult to read. This has been a slow process over the past few years and concerns her that she may be experiencing cognitive deterioration. This does not seen likely,  given her other abilities. She is starting her volunteer activities, which helps her feel relevant and optimistic.                                                                                                                 `                                                                                                                   Garrel Ridgel, PhD  Time: 3:15p-4:00p 45 minutes

## 2022-11-07 ENCOUNTER — Ambulatory Visit (INDEPENDENT_AMBULATORY_CARE_PROVIDER_SITE_OTHER): Payer: Medicare PPO | Admitting: Psychology

## 2022-11-07 DIAGNOSIS — F411 Generalized anxiety disorder: Secondary | ICD-10-CM | POA: Diagnosis not present

## 2022-11-07 NOTE — Progress Notes (Signed)
Date: 11/07/2022  Diagnosis G47.00 (Insomnia disorder) [n/a]  300.02 (Generalized anxiety disorder) [n/a]  Symptoms Complains of difficulty remaining asleep. (Status: maintained) -- No Description Entered  Excessive and/or unrealistic worry that is difficult to control occurring more days than not for at least 6 months about a number of events or activities. (Status: maintained) -- No Description Entered  Medication Status compliance  Safety none  If Suicidal or Homicidal State Action Taken: unspecified  Current Risk: low Medications unspecified Objectives Related Problem: Resolve the core conflict that is the source of anxiety. Description: Describe situations, thoughts, feelings, and actions associated with anxieties and worries, their impact on functioning, and attempts to resolve them. Target Date: 2023-02-13 Frequency: Daily Modality: individual Progress: 80%  Related Problem: Resolve the core conflict that is the source of anxiety. Description: Learn and implement calming skills to reduce overall anxiety and manage anxiety symptoms. Target Date: 2023-02-13 Frequency: Daily Modality: individual Progress: 80%  Related Problem: Resolve the core conflict that is the source of anxiety. Description: Learn and implement problem-solving strategies for realistically addressing worries. Target Date: 2023-02-13 Frequency: Daily Modality: individual Progress: 80%  Related Problem: Resolve the core conflict that is the source of anxiety. Description: Maintain involvement in work, family, and social activities. Target Date: 2023-02-13 Frequency: Daily Modality: individual Progress: 80%  Related Problem: Restore restful sleep pattern. Description: Describe the history and  details of sleep pattern. Target Date: 2023-02-13 Frequency: Daily Modality: individual Progress: 85%    Client Response full compliance  Service Location Location, [address] [place], [place] [address]  Service Code cpt [phone] P Self-monitoring  Lifestyle change (exercise, nutrition)  Self care activities  Distress tolerance skill  Identify/label emotions  Identify automatic thoughts  Facilitate problem solving  Normalize/Reframe  Behavioral activation plan  Validate/empathize  Comments  Dx.: Generalized Anxiety (F41.1)  Meds: No psychotropics Goals: [name] is seeking to develop a more satisfying life-style that takes into account her current physical condition. Goal date 11-04-20. States that the current situation in the world (politics and Covid 19) is making her very anxious. She is "obsessed" with the news (social media) and remains in a constant state of stress. She wants to learn to be less reactive. Will initiate behaviorally oriented individual therapy to address symptoms. Goal date is 10-04-21. While patient has realized some improvement in level of anxiety, she still expresses considerable stress and anxiety. Continues to need better balance in life. Revised date is 12-24.  Patient agrees to a Engineering geologist video session and understands the limitations of this platform. She is at home and I am at my home office.   [name] states that she has had some physical symptoms that has interfered with some social activity. Is being followed by her doctor. She is trying to improve her sleep which she says is "horrible". Is trying to make some changes to increase sleeping time. She  has made a number of plans for some social gathers and is continuing to work with her poetry. She is going to start with another poetry group that has workshops. She is still writing, which helps her feel more stable and is a good creative outlet.                                                                                                                      `                                                                                                                  Garrel Ridgel, PhD  Time: 3:15p-4:00p 45 minutes

## 2022-11-11 DIAGNOSIS — G4733 Obstructive sleep apnea (adult) (pediatric): Secondary | ICD-10-CM | POA: Diagnosis not present

## 2022-11-14 ENCOUNTER — Ambulatory Visit (INDEPENDENT_AMBULATORY_CARE_PROVIDER_SITE_OTHER): Payer: Medicare PPO | Admitting: Psychology

## 2022-11-14 DIAGNOSIS — F411 Generalized anxiety disorder: Secondary | ICD-10-CM

## 2022-11-14 NOTE — Progress Notes (Signed)
Date: 11/14/2022  Diagnosis G47.00 (Insomnia disorder) [n/a]  300.02 (Generalized anxiety disorder) [n/a]  Symptoms Complains of difficulty remaining asleep. (Status: maintained) -- No Description Entered  Excessive and/or unrealistic worry that is difficult to control occurring more days than not for at least 6 months about a number of events or activities. (Status: maintained) -- No Description Entered  Medication Status compliance  Safety none  If Suicidal or Homicidal State Action Taken: unspecified  Current Risk: low Medications unspecified Objectives Related Problem: Resolve the core conflict that is the source of anxiety. Description: Describe situations, thoughts, feelings, and actions associated with anxieties and worries, their impact on functioning, and attempts to resolve them. Target Date: 2023-02-13 Frequency: Daily Modality: individual Progress: 80%  Related Problem: Resolve the core conflict that is the source of anxiety. Description: Learn and implement calming skills to reduce overall anxiety and manage anxiety symptoms. Target Date: 2023-02-13 Frequency: Daily Modality: individual Progress: 80%  Related Problem: Resolve the core conflict that is the source of anxiety. Description: Learn and implement problem-solving strategies for realistically addressing worries. Target Date: 2023-02-13 Frequency: Daily Modality: individual Progress: 80%  Related Problem: Resolve the core conflict that is the source of anxiety. Description: Maintain involvement in work, family, and social activities. Target Date: 2023-02-13 Frequency: Daily Modality: individual Progress: 80%  Related Problem: Restore restful sleep pattern. Description:  Describe the history and details of sleep pattern. Target Date: 2023-02-13 Frequency: Daily Modality: individual Progress: 85%    Client Response full compliance  Service Location Location, [address] [place], [place] [address]  Service Code cpt [phone] P Self-monitoring  Lifestyle change (exercise, nutrition)  Self care activities  Distress tolerance skill  Identify/label emotions  Identify automatic thoughts  Facilitate problem solving  Normalize/Reframe  Behavioral activation plan  Validate/empathize  Comments  Dx.: Generalized Anxiety (F41.1)  Meds: No psychotropics Goals: Jesseka is seeking to develop a more satisfying life-style that takes into account her current physical condition. Goal date 11-04-20. States that the current situation in the world (politics and Covid 19) is making her very anxious. She is "obsessed" with the news (social media) and remains in a constant state of stress. She wants to learn to be less reactive. Will initiate behaviorally oriented individual therapy to address symptoms. Goal date is 10-04-21. While patient has realized some improvement in level of anxiety, she still expresses considerable stress and anxiety. Continues to need better balance in life. Revised date is 12-24.  Patient agrees to a Engineering geologist video session and understands the limitations of this platform. She is at home and I am at my home office.   Marcheta states that she is nervous about the debate [place] and expressed her ongoing frustation with the state of politics. She is spending some of her time volunteering for the  campaign, which has been a great activity to distract from her day to day stressors. She is also volunteering at [facility] around the high holidays. She claims that she is trying to take better of herself, and is cooking more and watching her diets.                                                                                                                         `                                                                                                                  Garrel Ridgel, PhD  Time: 3:10p-4:00p 50 minutes

## 2022-11-21 ENCOUNTER — Ambulatory Visit (INDEPENDENT_AMBULATORY_CARE_PROVIDER_SITE_OTHER): Payer: Medicare PPO | Admitting: Psychology

## 2022-11-21 DIAGNOSIS — G4700 Insomnia, unspecified: Secondary | ICD-10-CM | POA: Diagnosis not present

## 2022-11-21 DIAGNOSIS — F411 Generalized anxiety disorder: Secondary | ICD-10-CM

## 2022-11-21 NOTE — Progress Notes (Signed)
Date: 11/21/2022  Diagnosis G47.00 (Insomnia disorder) [n/a]  300.02 (Generalized anxiety disorder) [n/a]  Symptoms Complains of difficulty remaining asleep. (Status: maintained) -- No Description Entered  Excessive and/or unrealistic worry that is difficult to control occurring more days than not for at least 6 months about a number of events or activities. (Status: maintained) -- No Description Entered  Medication Status compliance  Safety none  If Suicidal or Homicidal State Action Taken: unspecified  Current Risk: low Medications unspecified Objectives Related Problem: Resolve the core conflict that is the source of anxiety. Description: Describe situations, thoughts, feelings, and actions associated with anxieties and worries, their impact on functioning, and attempts to resolve them. Target Date: 2023-02-13 Frequency: Daily Modality: individual Progress: 80%  Related Problem: Resolve the core conflict that is the source of anxiety. Description: Learn and implement calming skills to reduce overall anxiety and manage anxiety symptoms. Target Date: 2023-02-13 Frequency: Daily Modality: individual Progress: 80%  Related Problem: Resolve the core conflict that is the source of anxiety. Description: Learn and implement problem-solving strategies for realistically addressing worries. Target Date: 2023-02-13 Frequency: Daily Modality: individual Progress: 80%  Related Problem: Resolve the core conflict that is the source of anxiety. Description: Maintain involvement in work, family, and social activities. Target Date: 2023-02-13 Frequency: Daily Modality: individual Progress: 80%  Related Problem: Restore restful sleep pattern. Description: Describe the history and details of sleep pattern. Target Date: 2023-02-13 Frequency: Daily Modality: individual Progress: 85%    Client Response full compliance  Service  Location Location, [address] [place], [place] [address]  Service Code cpt [phone] P Self-monitoring  Lifestyle change (exercise, nutrition)  Self care activities  Distress tolerance skill  Identify/label emotions  Identify automatic thoughts  Facilitate problem solving  Normalize/Reframe  Behavioral activation plan  Validate/empathize  Comments  Dx.: Generalized Anxiety (F41.1)  Meds: No psychotropics Goals: [name] is seeking to develop a more satisfying life-style that takes into account her current physical condition. Goal date 11-04-20. States that the current situation in the world (politics and Covid 19) is making her very anxious. She is "obsessed" with the news (social media) and remains in a constant state of stress. She wants to learn to be less reactive. Will initiate behaviorally oriented individual therapy to address symptoms. Goal date is 10-04-21. While patient has realized some improvement in level of anxiety, she still expresses considerable stress and anxiety. Continues to need better balance in life. Revised date is 12-24.  Patient agrees to a Engineering geologist video session and understands the limitations of this platform. She is at home and I am at my home office.   [name] says that she had a "scare" today with a lot of things at home going wrong. Her blood pressure got very high and she called EMS. It was over 200, but went down to 147 and EMS did not take her to hospital. She feels it was her anxiety because of all the things she was trying to do at home. We talked about relaxation strategies and ways to calm her tension. Will use deep breathing techniques we discussed. She will also employ some of the meditation apps she has.                                                                                                                           `  Garrel Ridgel, PhD  Time: 3:10p-4:00p 50 minutes

## 2022-11-22 DIAGNOSIS — G4733 Obstructive sleep apnea (adult) (pediatric): Secondary | ICD-10-CM | POA: Diagnosis not present

## 2022-11-28 ENCOUNTER — Ambulatory Visit (INDEPENDENT_AMBULATORY_CARE_PROVIDER_SITE_OTHER): Payer: Medicare PPO | Admitting: Psychology

## 2022-11-28 DIAGNOSIS — F411 Generalized anxiety disorder: Secondary | ICD-10-CM

## 2022-11-28 NOTE — Progress Notes (Signed)
Date: 11/28/2022  Diagnosis G47.00 (Insomnia disorder) [n/a]  300.02 (Generalized anxiety disorder) [n/a]  Symptoms Complains of difficulty remaining asleep. (Status: maintained) -- No Description Entered  Excessive and/or unrealistic worry that is difficult to control occurring more days than not for at least 6 months about a number of events or activities. (Status: maintained) -- No Description Entered  Medication Status compliance  Safety none  If Suicidal or Homicidal State Action Taken: unspecified  Current Risk: low Medications unspecified Objectives Related Problem: Resolve the core conflict that is the source of anxiety. Description: Describe situations, thoughts, feelings, and actions associated with anxieties and worries, their impact on functioning, and attempts to resolve them. Target Date: 2023-02-13 Frequency: Daily Modality: individual Progress: 80%  Related Problem: Resolve the core conflict that is the source of anxiety. Description: Learn and implement calming skills to reduce overall anxiety and manage anxiety symptoms. Target Date: 2023-02-13 Frequency: Daily Modality: individual Progress: 80%  Related Problem: Resolve the core conflict that is the source of anxiety. Description: Learn and implement problem-solving strategies for realistically addressing worries. Target Date: 2023-02-13 Frequency: Daily Modality: individual Progress: 80%  Related Problem: Resolve the core conflict that is the source of anxiety. Description: Maintain involvement in work, family, and social activities. Target Date: 2023-02-13 Frequency: Daily Modality: individual Progress: 80%  Related Problem: Restore restful sleep pattern. Description: Describe the history and details of sleep pattern. Target Date: 2023-02-13 Frequency: Daily Modality: individual Progress: 85%    Client  Response full compliance  Service Location Location, [address] [place], [place] [address]  Service Code cpt [phone] P Self-monitoring  Lifestyle change (exercise, nutrition)  Self care activities  Distress tolerance skill  Identify/label emotions  Identify automatic thoughts  Facilitate problem solving  Normalize/Reframe  Behavioral activation plan  Validate/empathize  Comments  Dx.: Generalized Anxiety (F41.1)  Meds: No psychotropics Goals: [name] is seeking to develop a more satisfying life-style that takes into account her current physical condition. Goal date 11-04-20. States that the current situation in the world (politics and Covid 19) is making her very anxious. She is "obsessed" with the news (social media) and remains in a constant state of stress. She wants to learn to be less reactive. Will initiate behaviorally oriented individual therapy to address symptoms. Goal date is 10-04-21. While patient has realized some improvement in level of anxiety, she still expresses considerable stress and anxiety. Continues to need better balance in life. Revised date is 12-24.  Patient agrees to a Engineering geologist video session and understands the limitations of this platform. She is at home and I am at my home office.   [name] says she is being very cautious about her blood pressure problems. She is reporting that her anxiety is high about her health and she is trying to manage. She is hyper focused on her salt intake. She is trying deep breathing exercises to help manage her anxiety. She also expresses many concerns about her own mortality.                                                                                                                             `  Garrel Ridgel, PhD  Time: 3:10p-4:00p 50 minutes

## 2022-12-05 ENCOUNTER — Ambulatory Visit: Payer: Medicare PPO | Admitting: Psychology

## 2022-12-05 DIAGNOSIS — F411 Generalized anxiety disorder: Secondary | ICD-10-CM | POA: Diagnosis not present

## 2022-12-05 NOTE — Progress Notes (Signed)
Date: 12/05/2022  Diagnosis G47.00 (Insomnia disorder) [n/a]  300.02 (Generalized anxiety disorder) [n/a]  Symptoms Complains of difficulty remaining asleep. (Status: maintained) -- No Description Entered  Excessive and/or unrealistic worry that is difficult to control occurring more days than not for at least 6 months about a number of events or activities. (Status: maintained) -- No Description Entered  Medication Status compliance  Safety none  If Suicidal or Homicidal State Action Taken: unspecified  Current Risk: low Medications unspecified Objectives Related Problem: Resolve the core conflict that is the source of anxiety. Description: Describe situations, thoughts, feelings, and actions associated with anxieties and worries, their impact on functioning, and attempts to resolve them. Target Date: 2023-02-13 Frequency: Daily Modality: individual Progress: 80%  Related Problem: Resolve the core conflict that is the source of anxiety. Description: Learn and implement calming skills to reduce overall anxiety and manage anxiety symptoms. Target Date: 2023-02-13 Frequency: Daily Modality: individual Progress: 80%  Related Problem: Resolve the core conflict that is the source of anxiety. Description: Learn and implement problem-solving strategies for realistically addressing worries. Target Date: 2023-02-13 Frequency: Daily Modality: individual Progress: 80%  Related Problem: Resolve the core conflict that is the source of anxiety. Description: Maintain involvement in work, family, and social activities. Target Date: 2023-02-13 Frequency: Daily Modality: individual Progress: 80%  Related Problem: Restore restful sleep pattern. Description: Describe the history and details of sleep pattern. Target Date: 2023-02-13 Frequency: Daily Modality: individual Progress:  85%    Client Response full compliance  Service Location Location, [address] [place], [place] [address]  Service Code cpt [phone] P Self-monitoring  Lifestyle change (exercise, nutrition)  Self care activities  Distress tolerance skill  Identify/label emotions  Identify automatic thoughts  Facilitate problem solving  Normalize/Reframe  Behavioral activation plan  Validate/empathize  Comments  Dx.: Generalized Anxiety (F41.1)  Meds: No psychotropics Goals: Jordi is seeking to develop a more satisfying life-style that takes into account her current physical condition. Goal date 11-04-20. States that the current situation in the world (politics and Covid 19) is making her very anxious. She is "obsessed" with the news (social media) and remains in a constant state of stress. She wants to learn to be less reactive. Will initiate behaviorally oriented individual therapy to address symptoms. Goal date is 10-04-21. While patient has realized some improvement in level of anxiety, she still expresses considerable stress and anxiety. Continues to need better balance in life. Revised date is 12-24.  Patient agrees to a Engineering geologist video session and understands the limitations of this platform. She is at home and I am at my home office.   Deneane says that she has been struggling with vertigo this past week. She says that she has been acutely aware of people (famous) from her generation that are dying. Makes her more in touch with her own mortality. Not especially worried, but  she says it keeps her more focused on her health. She does not plan to celebrate the high holidays, unless she decides to connect by zoom. She is in good spirits and reports stable moods. She says her biggest issue is not letting herself get too angry when things are "  going wrong". Talked about self talk to manage upset feelings.                                                                                                                                `                                                                                                                  Garrel Ridgel, PhD  Time: 3:10p-4:00p 50 minutes

## 2022-12-11 DIAGNOSIS — F419 Anxiety disorder, unspecified: Secondary | ICD-10-CM | POA: Diagnosis not present

## 2022-12-11 DIAGNOSIS — F330 Major depressive disorder, recurrent, mild: Secondary | ICD-10-CM | POA: Diagnosis not present

## 2022-12-11 DIAGNOSIS — Z0000 Encounter for general adult medical examination without abnormal findings: Secondary | ICD-10-CM | POA: Diagnosis not present

## 2022-12-11 DIAGNOSIS — G4733 Obstructive sleep apnea (adult) (pediatric): Secondary | ICD-10-CM | POA: Diagnosis not present

## 2022-12-11 DIAGNOSIS — E039 Hypothyroidism, unspecified: Secondary | ICD-10-CM | POA: Diagnosis not present

## 2022-12-11 DIAGNOSIS — N3946 Mixed incontinence: Secondary | ICD-10-CM | POA: Diagnosis not present

## 2022-12-11 DIAGNOSIS — I10 Essential (primary) hypertension: Secondary | ICD-10-CM | POA: Diagnosis not present

## 2022-12-11 DIAGNOSIS — M85861 Other specified disorders of bone density and structure, right lower leg: Secondary | ICD-10-CM | POA: Diagnosis not present

## 2022-12-11 DIAGNOSIS — R609 Edema, unspecified: Secondary | ICD-10-CM | POA: Diagnosis not present

## 2022-12-12 ENCOUNTER — Ambulatory Visit (INDEPENDENT_AMBULATORY_CARE_PROVIDER_SITE_OTHER): Payer: Medicare PPO | Admitting: Psychology

## 2022-12-12 DIAGNOSIS — F411 Generalized anxiety disorder: Secondary | ICD-10-CM

## 2022-12-12 NOTE — Progress Notes (Signed)
Date: 12/12/2022  Diagnosis G47.00 (Insomnia disorder) [n/a]  300.02 (Generalized anxiety disorder) [n/a]  Symptoms Complains of difficulty remaining asleep. (Status: maintained) -- No Description Entered  Excessive and/or unrealistic worry that is difficult to control occurring more days than not for at least 6 months about a number of events or activities. (Status: maintained) -- No Description Entered  Medication Status compliance  Safety none  If Suicidal or Homicidal State Action Taken: unspecified  Current Risk: low Medications unspecified Objectives Related Problem: Resolve the core conflict that is the source of anxiety. Description: Describe situations, thoughts, feelings, and actions associated with anxieties and worries, their impact on functioning, and attempts to resolve them. Target Date: 2023-02-13 Frequency: Daily Modality: individual Progress: 80%  Related Problem: Resolve the core conflict that is the source of anxiety. Description: Learn and implement calming skills to reduce overall anxiety and manage anxiety symptoms. Target Date: 2023-02-13 Frequency: Daily Modality: individual Progress: 80%  Related Problem: Resolve the core conflict that is the source of anxiety. Description: Learn and implement problem-solving strategies for realistically addressing worries. Target Date: 2023-02-13 Frequency: Daily Modality: individual Progress: 80%  Related Problem: Resolve the core conflict that is the source of anxiety. Description: Maintain involvement in work, family, and social activities. Target Date: 2023-02-13 Frequency: Daily Modality: individual Progress: 80%  Related Problem: Restore restful sleep pattern. Description: Describe the history and details of sleep pattern. Target Date: 2023-02-13 Frequency:  Daily Modality: individual Progress: 85%    Client Response full compliance  Service Location Location, [address] [place], [place] [address]  Service Code cpt [phone] P Self-monitoring  Lifestyle change (exercise, nutrition)  Self care activities  Distress tolerance skill  Identify/label emotions  Identify automatic thoughts  Facilitate problem solving  Normalize/Reframe  Behavioral activation plan  Validate/empathize  Comments  Dx.: Generalized Anxiety (F41.1)  Meds: No psychotropics Goals: Skyra is seeking to develop a more satisfying life-style that takes into account her current physical condition. Goal date 11-04-20. States that the current situation in the world (politics and Covid 19) is making her very anxious. She is "obsessed" with the news (social media) and remains in a constant state of stress. She wants to learn to be less reactive. Will initiate behaviorally oriented individual therapy to address symptoms. Goal date is 10-04-21. While patient has realized some improvement in level of anxiety, she still expresses considerable stress and anxiety. Continues to need better balance in life. Revised date is 12-24.  Patient agrees to a Engineering geologist video session and understands the limitations of this platform. She is at home and I am at my home office.   Kymani says she has been getting stressed and reports that the meditation/breathing exercises do not help. She thinks it is the current political situation that is precipitating her stress/anxiety episodes. She is staying busy with a variety of community activities. We discussed practicing more of her breathing exercises so that it is more familiar and therefore more effective. She is also very stressed about her sleep difficulties. She awakens after 5 hours and struggles to get back to  sleep. Will utilize some of the non medicine strategies to get back to sleep.                                                                                                                                     `                                                                             Garrel Ridgel, PhD  Time: 3:10p-4:00p 50 minutes

## 2022-12-19 ENCOUNTER — Ambulatory Visit (INDEPENDENT_AMBULATORY_CARE_PROVIDER_SITE_OTHER): Payer: Medicare PPO | Admitting: Psychology

## 2022-12-19 DIAGNOSIS — F411 Generalized anxiety disorder: Secondary | ICD-10-CM | POA: Diagnosis not present

## 2022-12-19 NOTE — Progress Notes (Signed)
Date: 12/19/2022  Diagnosis G47.00 (Insomnia disorder) [n/a]  300.02 (Generalized anxiety disorder) [n/a]  Symptoms Complains of difficulty remaining asleep. (Status: maintained) -- No Description Entered  Excessive and/or unrealistic worry that is difficult to control occurring more days than not for at least 6 months about a number of events or activities. (Status: maintained) -- No Description Entered  Medication Status compliance  Safety none  If Suicidal or Homicidal State Action Taken: unspecified  Current Risk: low Medications unspecified Objectives Related Problem: Resolve the core conflict that is the source of anxiety. Description: Describe situations, thoughts, feelings, and actions associated with anxieties and worries, their impact on functioning, and attempts to resolve them. Target Date: 2023-02-13 Frequency: Daily Modality: individual Progress: 80%  Related Problem: Resolve the core conflict that is the source of anxiety. Description: Learn and implement calming skills to reduce overall anxiety and manage anxiety symptoms. Target Date: 2023-02-13 Frequency: Daily Modality: individual Progress: 80%  Related Problem: Resolve the core conflict that is the source of anxiety. Description: Learn and implement problem-solving strategies for realistically addressing worries. Target Date: 2023-02-13 Frequency: Daily Modality: individual Progress: 80%  Related Problem: Resolve the core conflict that is the source of anxiety. Description: Maintain involvement in work, family, and social activities. Target Date: 2023-02-13 Frequency: Daily Modality: individual Progress: 80%  Related Problem: Restore restful sleep pattern. Description: Describe the history and details of sleep pattern. Target Date:  2023-02-13 Frequency: Daily Modality: individual Progress: 85%    Client Response full compliance  Service Location Location, [address] [place], [place] [address]  Service Code cpt [phone] P Self-monitoring  Lifestyle change (exercise, nutrition)  Self care activities  Distress tolerance skill  Identify/label emotions  Identify automatic thoughts  Facilitate problem solving  Normalize/Reframe  Behavioral activation plan  Validate/empathize  Comments  Dx.: Generalized Anxiety (F41.1)  Meds: No psychotropics Goals: Shaquasha is seeking to develop a more satisfying life-style that takes into account her current physical condition. Goal date 11-04-20. States that the current situation in the world (politics and Covid 19) is making her very anxious. She is "obsessed" with the news (social media) and remains in a constant state of stress. She wants to learn to be less reactive. Will initiate behaviorally oriented individual therapy to address symptoms. Goal date is 10-04-21. While patient has realized some improvement in level of anxiety, she still expresses considerable stress and anxiety. Continues to need better balance in life. Revised date is 12-24.  Patient agrees to a Engineering geologist video session and understands the limitations of this platform. She is at home and I am at my home office.   Rain says she found a way to sleep better. She uses a you tube deep breathing exercise. It helps her, but had a couple of situations in which it didn't help. She continues to fear the election outcome and speculates about all of the terrible things that could possibly happen. Reports that she feels "trapped" in that if her candidate loses, she will  not have any place to go to live. We discussed needing to modulate her anxiety and think through her worst case scenarios. Encouraged more relaxation techniques.                                                                                                                                          `                                                                              Garrel Ridgel, PhD  Time: 3:10p-4:00p 50 minutes

## 2022-12-26 ENCOUNTER — Ambulatory Visit (INDEPENDENT_AMBULATORY_CARE_PROVIDER_SITE_OTHER): Payer: Medicare PPO | Admitting: Psychology

## 2022-12-26 DIAGNOSIS — F411 Generalized anxiety disorder: Secondary | ICD-10-CM | POA: Diagnosis not present

## 2022-12-26 DIAGNOSIS — G4700 Insomnia, unspecified: Secondary | ICD-10-CM

## 2022-12-26 NOTE — Progress Notes (Signed)
Date: 12/26/2022  Diagnosis G47.00 (Insomnia disorder) [n/a]  300.02 (Generalized anxiety disorder) [n/a]  Symptoms Complains of difficulty remaining asleep. (Status: maintained) -- No Description Entered  Excessive and/or unrealistic worry that is difficult to control occurring more days than not for at least 6 months about a number of events or activities. (Status: maintained) -- No Description Entered  Medication Status compliance  Safety none  If Suicidal or Homicidal State Action Taken: unspecified  Current Risk: low Medications unspecified Objectives Related Problem: Resolve the core conflict that is the source of anxiety. Description: Describe situations, thoughts, feelings, and actions associated with anxieties and worries, their impact on functioning, and attempts to resolve them. Target Date: 2023-02-13 Frequency: Daily Modality: individual Progress: 80%  Related Problem: Resolve the core conflict that is the source of anxiety. Description: Learn and implement calming skills to reduce overall anxiety and manage anxiety symptoms. Target Date: 2023-02-13 Frequency: Daily Modality: individual Progress: 80%  Related Problem: Resolve the core conflict that is the source of anxiety. Description: Learn and implement problem-solving strategies for realistically addressing worries. Target Date: 2023-02-13 Frequency: Daily Modality: individual Progress: 80%  Related Problem: Resolve the core conflict that is the source of anxiety. Description: Maintain involvement in work, family, and social activities. Target Date: 2023-02-13 Frequency: Daily Modality: individual Progress: 80%  Related Problem: Restore restful sleep pattern. Description: Describe the history and details of  sleep pattern. Target Date: 2023-02-13 Frequency: Daily Modality: individual Progress: 85%    Client Response full compliance  Service Location Location, [address] [place], [place] [address]  Service Code cpt [phone] P Self-monitoring  Lifestyle change (exercise, nutrition)  Self care activities  Distress tolerance skill  Identify/label emotions  Identify automatic thoughts  Facilitate problem solving  Normalize/Reframe  Behavioral activation plan  Validate/empathize  Comments  Dx.: Generalized Anxiety (F41.1)  Meds: No psychotropics Goals: [name] is seeking to develop a more satisfying life-style that takes into account her current physical condition. Goal date 11-04-20. States that the current situation in the world (politics and Covid 19) is making her very anxious. She is "obsessed" with the news (social media) and remains in a constant state of stress. She wants to learn to be less reactive. Will initiate behaviorally oriented individual therapy to address symptoms. Goal date is 10-04-21. While patient has realized some improvement in level of anxiety, she still expresses considerable stress and anxiety. Continues to need better balance in life. Revised date is 12-24.  Patient agrees to a Engineering geologist video session and understands the limitations of this platform. She is at home and I am at my home office.   [name] says she hasn't been sleeping well. She is having trouble with her CPAP machine which is now resolved. She also says that she is "so worked up" about the election, it is interfering with her sleep. She doesn't feel she can do much to resolve her anxiety  until after the election. She agrees that keeping busy is her best option. [name] says her moods are "okay" under the circumstances, but she still gets overwhelmed at times. Will try to provide more structure for herself and her days.   `                                                                                                                                          `                                                                              Garrel Ridgel, PhD  Time: 3:15p-4:00p 45 minutes

## 2023-01-02 ENCOUNTER — Ambulatory Visit (INDEPENDENT_AMBULATORY_CARE_PROVIDER_SITE_OTHER): Payer: Medicare PPO | Admitting: Psychology

## 2023-01-02 DIAGNOSIS — F411 Generalized anxiety disorder: Secondary | ICD-10-CM | POA: Diagnosis not present

## 2023-01-02 DIAGNOSIS — G4700 Insomnia, unspecified: Secondary | ICD-10-CM

## 2023-01-02 NOTE — Progress Notes (Signed)
Date: 01/02/2023  Diagnosis G47.00 (Insomnia disorder) [n/a]  300.02 (Generalized anxiety disorder) [n/a]  Symptoms Complains of difficulty remaining asleep. (Status: maintained) -- No Description Entered  Excessive and/or unrealistic worry that is difficult to control occurring more days than not for at least 6 months about a number of events or activities. (Status: maintained) -- No Description Entered  Medication Status compliance  Safety none  If Suicidal or Homicidal State Action Taken: unspecified  Current Risk: low Medications unspecified Objectives Related Problem: Resolve the core conflict that is the source of anxiety. Description: Describe situations, thoughts, feelings, and actions associated with anxieties and worries, their impact on functioning, and attempts to resolve them. Target Date: 2023-02-13 Frequency: Daily Modality: individual Progress: 80%  Related Problem: Resolve the core conflict that is the source of anxiety. Description: Learn and implement calming skills to reduce overall anxiety and manage anxiety symptoms. Target Date: 2023-02-13 Frequency: Daily Modality: individual Progress: 80%  Related Problem: Resolve the core conflict that is the source of anxiety. Description: Learn and implement problem-solving strategies for realistically addressing worries. Target Date: 2023-02-13 Frequency: Daily Modality: individual Progress: 80%  Related Problem: Resolve the core conflict that is the source of anxiety. Description: Maintain involvement in work, family, and social activities. Target Date: 2023-02-13 Frequency: Daily Modality: individual Progress: 80%  Related Problem: Restore restful sleep pattern. Description:  Describe the history and details of sleep pattern. Target Date: 2023-02-13 Frequency: Daily Modality: individual Progress: 85%    Client Response full compliance  Service Location Location, [address] [place], [place] [address]  Service Code cpt [phone] P Self-monitoring  Lifestyle change (exercise, nutrition)  Self care activities  Distress tolerance skill  Identify/label emotions  Identify automatic thoughts  Facilitate problem solving  Normalize/Reframe  Behavioral activation plan  Validate/empathize  Comments  Dx.: Generalized Anxiety (F41.1)  Meds: No psychotropics Goals: [name] is seeking to develop a more satisfying life-style that takes into account her current physical condition. Goal date 11-04-20. States that the current situation in the world (politics and Covid 19) is making her very anxious. She is "obsessed" with the news (social media) and remains in a constant state of stress. She wants to learn to be less reactive. Will initiate behaviorally oriented individual therapy to address symptoms. Goal date is 10-04-21. While patient has realized some improvement in level of anxiety, she still expresses considerable stress and anxiety. Continues to need better balance in life. Revised date is 12-24.  Patient agrees to a Engineering geologist video session and understands the limitations of this platform. She is at home and I am at my home office.   [name] says she is terribly distraught over the election. She expressed a lot of fears if the election "goes in the wrong direction". She fears for her own safety and of the safety  of the people she cares about. She tends to catastrophize the political situation. This keeps her awake and anxious. She admits that she is watching a lot to television. Suggested that she pull back from her exposure. Will work this week to engage in distractions.      `                                                                                                                                          `                                                                              Garrel Ridgel, PhD  Time: 3:15p-4:00p 45 minutes

## 2023-01-09 ENCOUNTER — Ambulatory Visit (INDEPENDENT_AMBULATORY_CARE_PROVIDER_SITE_OTHER): Payer: Medicare PPO | Admitting: Psychology

## 2023-01-09 DIAGNOSIS — F411 Generalized anxiety disorder: Secondary | ICD-10-CM

## 2023-01-09 NOTE — Progress Notes (Signed)
Date: 01/09/2023  Diagnosis G47.00 (Insomnia disorder) [n/a]  300.02 (Generalized anxiety disorder) [n/a]  Symptoms Complains of difficulty remaining asleep. (Status: maintained) -- No Description Entered  Excessive and/or unrealistic worry that is difficult to control occurring more days than not for at least 6 months about a number of events or activities. (Status: maintained) -- No Description Entered  Medication Status compliance  Safety none  If Suicidal or Homicidal State Action Taken: unspecified  Current Risk: low Medications unspecified Objectives Related Problem: Resolve the core conflict that is the source of anxiety. Description: Describe situations, thoughts, feelings, and actions associated with anxieties and worries, their impact on functioning, and attempts to resolve them. Target Date: 2023-02-13 Frequency: Daily Modality: individual Progress: 80%  Related Problem: Resolve the core conflict that is the source of anxiety. Description: Learn and implement calming skills to reduce overall anxiety and manage anxiety symptoms. Target Date: 2023-02-13 Frequency: Daily Modality: individual Progress: 80%  Related Problem: Resolve the core conflict that is the source of anxiety. Description: Learn and implement problem-solving strategies for realistically addressing worries. Target Date: 2023-02-13 Frequency: Daily Modality: individual Progress: 80%  Related Problem: Resolve the core conflict that is the source of anxiety. Description: Maintain involvement in work, family, and social activities. Target Date: 2023-02-13 Frequency: Daily Modality: individual Progress: 80%  Related Problem: Restore restful sleep  pattern. Description: Describe the history and details of sleep pattern. Target Date: 2023-02-13 Frequency: Daily Modality: individual Progress: 85%    Client Response full compliance  Service Location Location, [address] [place], [place] [address]  Service Code cpt [phone] P Self-monitoring  Lifestyle change (exercise, nutrition)  Self care activities  Distress tolerance skill  Identify/label emotions  Identify automatic thoughts  Facilitate problem solving  Normalize/Reframe  Behavioral activation plan  Validate/empathize  Comments  Dx.: Generalized Anxiety (F41.1)  Meds: No psychotropics Goals: [name] is seeking to develop a more satisfying life-style that takes into account her current physical condition. Goal date 11-04-20. States that the current situation in the world (politics and Covid 19) is making her very anxious. She is "obsessed" with the news (social media) and remains in a constant state of stress. She wants to learn to be less reactive. Will initiate behaviorally oriented individual therapy to address symptoms. Goal date is 10-04-21. While patient has realized some improvement in level of anxiety, she still expresses considerable stress and anxiety. Continues to need better balance in life. Revised date is 12-24.  Patient agrees to a Engineering geologist video session and understands the limitations of this platform. She is at home and I am at my home office.   [name] says that she is very nervous about today's election. Very agitated, but is working to stay calm.  Claims she is losing sleep. [name] says that she and her sister are getting out a lot and attending some community events. This has been very good for her anxiety as it serves as a good distraction. We talked about preparing herself in case the election goes against her candidate. She does not feel she will be able to keep from being terrified. Discussed the need to take a broader view of history and look to  the more optimistic side of humanity. She will try, but says "that will be hard. In addition to activity distraction, we talked of writing as a means to reduce stress.          `                                                                                                                                         `                                                                              Garrel Ridgel, PhD  Time: 3:15p-4:00p 45 minutes

## 2023-01-11 DIAGNOSIS — G4733 Obstructive sleep apnea (adult) (pediatric): Secondary | ICD-10-CM | POA: Diagnosis not present

## 2023-01-16 ENCOUNTER — Other Ambulatory Visit: Payer: Self-pay

## 2023-01-16 ENCOUNTER — Ambulatory Visit: Payer: Medicare PPO | Admitting: Psychology

## 2023-01-16 ENCOUNTER — Emergency Department (HOSPITAL_COMMUNITY)
Admission: EM | Admit: 2023-01-16 | Discharge: 2023-01-17 | Disposition: A | Discharge status: Home / Self Care | Payer: Medicare PPO | Attending: Emergency Medicine | Admitting: Emergency Medicine

## 2023-01-16 DIAGNOSIS — R42 Dizziness and giddiness: Secondary | ICD-10-CM | POA: Diagnosis present

## 2023-01-16 DIAGNOSIS — E039 Hypothyroidism, unspecified: Secondary | ICD-10-CM | POA: Diagnosis not present

## 2023-01-16 DIAGNOSIS — I10 Essential (primary) hypertension: Secondary | ICD-10-CM | POA: Insufficient documentation

## 2023-01-16 LAB — BASIC METABOLIC PANEL
Anion gap: 10 (ref 5–15)
BUN: 18 mg/dL (ref 8–23)
CO2: 26 mmol/L (ref 22–32)
Calcium: 9 mg/dL (ref 8.9–10.3)
Chloride: 101 mmol/L (ref 98–111)
Creatinine, Ser: 0.74 mg/dL (ref 0.44–1.00)
GFR, Estimated: >60 mL/min (ref >60)
Glucose, Bld: 155 mg/dL — ABNORMAL HIGH (ref 70–99)
Potassium: 3.4 mmol/L — ABNORMAL LOW (ref 3.5–5.1)
Sodium: 137 mmol/L (ref 135–145)

## 2023-01-16 LAB — URINALYSIS, ROUTINE W REFLEX MICROSCOPIC
Bilirubin Urine: NEGATIVE
Glucose, UA: NEGATIVE mg/dL
Hgb urine dipstick: NEGATIVE
Ketones, ur: NEGATIVE mg/dL
Leukocytes,Ua: NEGATIVE
Nitrite: NEGATIVE
Protein, ur: NEGATIVE mg/dL
Specific Gravity, Urine: 1.003 — ABNORMAL LOW (ref 1.005–1.030)
pH: 7 (ref 5.0–8.0)

## 2023-01-16 LAB — CBC
HCT: 41.4 % (ref 36.0–46.0)
Hemoglobin: 13.4 g/dL (ref 12.0–15.0)
MCH: 27.6 pg (ref 26.0–34.0)
MCHC: 32.4 g/dL (ref 30.0–36.0)
MCV: 85.4 fL (ref 80.0–100.0)
Platelets: 282 10*3/uL (ref 150–400)
RBC: 4.85 MIL/uL (ref 3.87–5.11)
RDW: 14 % (ref 11.5–15.5)
WBC: 9.3 10*3/uL (ref 4.0–10.5)
nRBC: 0 % (ref 0.0–0.2)

## 2023-01-16 MED ORDER — HYDRALAZINE HCL 25 MG PO TABS
50.0000 mg | ORAL_TABLET | Freq: Once | ORAL | Status: AC
  Administered 2023-01-16: 50 mg via ORAL
  Filled 2023-01-16: qty 2

## 2023-01-16 MED ORDER — METOPROLOL SUCCINATE ER 25 MG PO TB24
25.0000 mg | ORAL_TABLET | Freq: Every day | ORAL | Status: DC
  Administered 2023-01-16: 25 mg via ORAL
  Filled 2023-01-16: qty 1

## 2023-01-16 NOTE — ED Provider Notes (Signed)
[place] EMERGENCY DEPARTMENT AT [facility] Provider Note   CSN: [id number] Arrival date & time: 01/16/23  1757     History {Add pertinent medical, surgical, social history, OB history to HPI:1} Chief Complaint  Patient presents with   Hypertension    [name] is a 77 y.o. female.  It is hurting 77 year old female with a history of generalized anxiety disorder, hypothyroidism and hypertension that presents the ER today with some dizziness.  Patient states that she gets anxious very easily.  She is also been very stressed recently as her sister has been in the hospital.  She states that she has been trying to find a reputable nursing facility to send her to [facility] finally found 1.  They were going to go home today but apparently there is a long delay and thus patient did not get chance take her nighttime medications.  Started feeling dizzy related to the stress, anxiety and high blood pressure.  They checked her blood pressure and it was elevated I directed her here to be evaluated.  Patient with no other neurologic changes.  She denies any chest pain, shortness of breath, vision changes, urination changes, abdominal pain or other symptoms.  No alcohol, drugs or tobacco.  No recent trauma.  Normally takes metoprolol 25 long-acting at night along with a dose of hydralazine.  Thyroid medications are in the morning and hydrochlorothiazide is in the afternoon and she did take those medications today.   Hypertension       Home Medications Prior to Admission medications   Medication Sig Start Date End Date Taking? Authorizing Provider  Ascorbic Acid (VITAMIN C) 1000 MG tablet Take 1,000 mg by mouth daily. One tablet a day    [provider]  benazepril-hydrochlorthiazide (LOTENSIN HCT) 20-12.5 MG tablet Take 2 tablets by mouth daily. 05/10/20   Just, Azalee Course, FNP  cholecalciferol (VITAMIN D3) 25 MCG (1000 UNIT) tablet Take 1,000 Units by mouth daily.     [provider]  hydrALAZINE (APRESOLINE) 50 MG tablet Take 1 tablet (50 mg total) by mouth 3 (three) times daily with meals. 07/23/22   Rai, Delene Ruffini, MD  levothyroxine (SYNTHROID) 112 MCG tablet Take 1 tablet (112 mcg total) by mouth daily before breakfast. 07/28/22   Carlus Pavlov, MD  liothyronine (CYTOMEL) 5 MCG tablet Take 1 tablet by mouth before breakfast 07/28/22   Carlus Pavlov, MD  metoprolol succinate (TOPROL-XL) 25 MG 24 hr tablet TAKE 1 TABLET(25 MG) BY MOUTH DAILY 03/15/20   Just, Azalee Course, FNP      Allergies    Bactrim [sulfamethoxazole-trimethoprim], Epinephrine, Erythromycin, and Sulfa antibiotics    Review of Systems   Review of Systems  Physical Exam Updated Vital Signs BP (!) 240/99   Pulse (!) 58   Temp 98.5 F (36.9 C)   Resp 17   SpO2 100%  Physical Exam Vitals and nursing note reviewed.  Constitutional:      Appearance: She is well-developed.  HENT:     Head: Normocephalic and atraumatic.  Cardiovascular:     Rate and Rhythm: Normal rate and regular rhythm.  Pulmonary:     Effort: No respiratory distress.     Breath sounds: No stridor. No wheezing or rales.  Abdominal:     General: There is no distension.  Musculoskeletal:     Cervical back: Normal range of motion.  Skin:    General: Skin is warm and dry.  Neurological:     General: No focal  deficit present.     Mental Status: She is alert.  Psychiatric:        Mood and Affect: Mood is anxious.     ED Results / Procedures / Treatments   Labs (all labs ordered are listed, but only abnormal results are displayed) Labs Reviewed  BASIC METABOLIC PANEL - Abnormal; Notable for the following components:      Result Value   Potassium 3.4 (*)    Glucose, Bld 155 (*)    All other components within normal limits  URINALYSIS, ROUTINE W REFLEX MICROSCOPIC - Abnormal; Notable for the following components:   Color, Urine STRAW (*)    Specific Gravity, Urine 1.003 (*)    All other  components within normal limits  CBC  CBG MONITORING, ED    EKG None  Radiology No results found.  Procedures Procedures  {Document cardiac monitor, telemetry assessment procedure when appropriate:1}  Medications Ordered in ED Medications  metoprolol succinate (TOPROL-XL) 24 hr tablet 25 mg (25 mg Oral Given 01/16/23 2323)  hydrALAZINE (APRESOLINE) tablet 50 mg (50 mg Oral Given 01/16/23 2323)    ED Course/ Medical Decision Making/ A&P   {   Click here for ABCD2, HEART and other calculatorsREFRESH Note before signing :1}                              Medical Decision Making Amount and/or Complexity of Data Reviewed Labs: ordered.  Risk Prescription drug management.   I suspect her hypertension is multifactorial related to stress, anxiety and not taking medication.  This is probably related to her dizziness.  Is no evidence of stroke on neurologic or physical exam otherwise.  No other evidence of endorgan damage related to blood pressure.  Will treat for her blood pressure.  Allow her to relax a little bit and should improve.  She does not want anxiety medicine at this time she states that she knows she has anxiety however she gets anxious about taking anxiety medicine and just makes it worse she prefers to see her therapist.  Unfortunately her therapy appointment supposed be today and she will be able to see him again for another week or 2 but she feels comfortable going home as long as her blood pressure improves a little bit.  Does not want IV medicines if possible as IVs make her anxious.  ***  {Document critical care time when appropriate:1} {Document review of labs and clinical decision tools ie heart score, Chads2Vasc2 etc:1}  {Document your independent review of radiology images, and any outside records:1} {Document your discussion with family members, caretakers, and with consultants:1} {Document social determinants of health affecting pt's care:1} {Document your  decision making why or why not admission, treatments were needed:1} Final Clinical Impression(s) / ED Diagnoses Final diagnoses:  None    Rx / DC Orders ED Discharge Orders     None

## 2023-01-16 NOTE — ED Triage Notes (Signed)
Patient arrives from visiting her sister on an inpatient floor. She states she had an episode of dizziness and they checked her BP and found her to be hypertensive. Dizziness has resolved. Has not taken her daily meds for hypertension yet. Endorses increased stress d/t election and her sister's hospitalization.

## 2023-01-16 NOTE — ED Notes (Signed)
Patient alert, oriented, ambulatory. Patient has not taken her blood pressure medication tonight because she was helping her sister go to a nursing home. Patient is very anxious about all this and has a lot of stressors she also attributes to it.

## 2023-01-23 ENCOUNTER — Ambulatory Visit: Payer: Medicare PPO | Admitting: Psychology

## 2023-01-26 DIAGNOSIS — I10 Essential (primary) hypertension: Secondary | ICD-10-CM | POA: Diagnosis not present

## 2023-01-26 DIAGNOSIS — D329 Benign neoplasm of meninges, unspecified: Secondary | ICD-10-CM | POA: Diagnosis not present

## 2023-01-26 DIAGNOSIS — R2689 Other abnormalities of gait and mobility: Secondary | ICD-10-CM | POA: Diagnosis not present

## 2023-01-26 DIAGNOSIS — F419 Anxiety disorder, unspecified: Secondary | ICD-10-CM | POA: Diagnosis not present

## 2023-01-29 ENCOUNTER — Ambulatory Visit: Payer: Medicare PPO | Admitting: Psychology

## 2023-01-29 DIAGNOSIS — F411 Generalized anxiety disorder: Secondary | ICD-10-CM

## 2023-01-29 NOTE — Progress Notes (Signed)
Date: 01/29/2023  Diagnosis G47.00 (Insomnia disorder) [n/a]  300.02 (Generalized anxiety disorder) [n/a]  Symptoms Complains of difficulty remaining asleep. (Status: maintained) -- No Description Entered  Excessive and/or unrealistic worry that is difficult to control occurring more days than not for at least 6 months about a number of events or activities. (Status: maintained) -- No Description Entered  Medication Status compliance  Safety none  If Suicidal or Homicidal State Action Taken: unspecified  Current Risk: low Medications unspecified Objectives Related Problem: Resolve the core conflict that is the source of anxiety. Description: Describe situations, thoughts, feelings, and actions associated with anxieties and worries, their impact on functioning, and attempts to resolve them. Target Date: 2023-02-13 Frequency: Daily Modality: individual Progress: 80%  Related Problem: Resolve the core conflict that is the source of anxiety. Description: Learn and implement calming skills to reduce overall anxiety and manage anxiety symptoms. Target Date: 2023-02-13 Frequency: Daily Modality: individual Progress: 80%  Related Problem: Resolve the core conflict that is the source of anxiety. Description: Learn and implement problem-solving strategies for realistically addressing worries. Target Date: 2023-02-13 Frequency: Daily Modality: individual Progress: 80%  Related Problem: Resolve the core conflict that is the source of anxiety. Description: Maintain involvement in work, family, and social activities. Target Date: 2023-02-13 Frequency: Daily Modality: individual Progress: 80%  Related  Problem: Restore restful sleep pattern. Description: Describe the history and details of sleep pattern. Target Date: 2023-02-13 Frequency: Daily Modality: individual Progress: 85%    Client Response full compliance  Service Location Location, [address] [place], [place] [address]  Service Code cpt [phone] P Self-monitoring  Lifestyle change (exercise, nutrition)  Self care activities  Distress tolerance skill  Identify/label emotions  Identify automatic thoughts  Facilitate problem solving  Normalize/Reframe  Behavioral activation plan  Validate/empathize  Comments  Dx.: Generalized Anxiety (F41.1)  Meds: No psychotropics Goals: [name] is seeking to develop a more satisfying life-style that takes into account her current physical condition. Goal date 11-04-20. States that the current situation in the world (politics and Covid 19) is making her very anxious. She is "obsessed" with the news (social media) and remains in a constant state of stress. She wants to learn to be less reactive. Will initiate behaviorally oriented individual therapy to address symptoms. Goal date is 10-04-21. While patient has realized some improvement in level of anxiety, she still expresses considerable stress and anxiety. Continues to need better balance in life. Revised date is 12-24.  Patient agrees to a Engineering geologist video session and understands the limitations of this platform. She is at home and I am at my home office.   [name] says that  her sister fell and broke a bone in her leg. She is now in a nursing facility for rehab. [name] finds it upsetting to be alone, especially during the holiday. We talked about ways to overcome her loneliness. [name] reflected on her feelings about the election and her ongoing distress. She is feeling "worn out" and feels she needs to start on a self-care journey. We talked on her dependence on her sister (emotionally) and the need to establish more autonomy.              `                                                                                                                                         `                                                                              Garrel Ridgel, PhD  Time: 3:15p-4:00p 45 minutes

## 2023-01-30 ENCOUNTER — Ambulatory Visit: Payer: Medicare PPO | Admitting: Psychology

## 2023-02-06 ENCOUNTER — Ambulatory Visit (INDEPENDENT_AMBULATORY_CARE_PROVIDER_SITE_OTHER): Payer: Medicare PPO | Admitting: Psychology

## 2023-02-06 DIAGNOSIS — G4700 Insomnia, unspecified: Secondary | ICD-10-CM

## 2023-02-06 DIAGNOSIS — F411 Generalized anxiety disorder: Secondary | ICD-10-CM

## 2023-02-06 NOTE — Progress Notes (Signed)
Date: 02/06/2023  Diagnosis G47.00 (Insomnia disorder) [n/a]  300.02 (Generalized anxiety disorder) [n/a]  Symptoms Complains of difficulty remaining asleep. (Status: maintained) -- No Description Entered  Excessive and/or unrealistic worry that is difficult to control occurring more days than not for at least 6 months about a number of events or activities. (Status: maintained) -- No Description Entered  Medication Status compliance  Safety none  If Suicidal or Homicidal State Action Taken: unspecified  Current Risk: low Medications unspecified Objectives Related Problem: Resolve the core conflict that is the source of anxiety. Description: Describe situations, thoughts, feelings, and actions associated with anxieties and worries, their impact on functioning, and attempts to resolve them. Target Date: 2024-02-13 Frequency: Daily Modality: individual Progress: 85%  Related Problem: Resolve the core conflict that is the source of anxiety. Description: Learn and implement calming skills to reduce overall anxiety and manage anxiety symptoms. Target Date: 2024-02-13 Frequency: Daily Modality: individual Progress: 80%  Related Problem: Resolve the core conflict that is the source of anxiety. Description: Learn and implement problem-solving strategies for realistically addressing worries. Target Date: 2024-02-13 Frequency: Daily Modality: individual Progress: 85%  Related Problem: Resolve the core conflict that is the source of anxiety. Description: Maintain involvement in work, family, and social activities. Target Date: 2024-02-13 Frequency: Daily Modality: individual Progress: 85%  Related Problem: Restore restful sleep pattern. Description: Describe the history and details of sleep pattern. Target Date: 2024-02-13 Frequency: Daily Modality: individual Progress: 90%    Client Response full compliance  Service Location Location, [address]  [place], [place] [address]  Service Code cpt [phone] P Self-monitoring  Lifestyle change (exercise, nutrition)  Self care activities  Distress tolerance skill  Identify/label emotions  Identify automatic thoughts  Facilitate problem solving  Normalize/Reframe  Behavioral activation plan  Validate/empathize  Comments  Dx.: Generalized Anxiety (F41.1)  Meds: No psychotropics Goals: [name] is seeking to develop a more satisfying life-style that takes into account her current physical condition. Goal date 11-04-20. States that the current situation in the world (politics and Covid 19) is making her very anxious. She is "obsessed" with the news (social media) and remains in a constant state of stress. She wants to learn to be less reactive. Will initiate behaviorally oriented individual therapy to address symptoms. Goal date is 10-04-21. While patient has realized some improvement in level of anxiety, she still expresses considerable stress and anxiety. Continues to need better balance in life. Revised date is 12-25.  Patient agrees to a Engineering geologist video session and understands the limitations of this platform. She is at home and I am at my home office.   [name] says that her sister is doing better. She is in rehab. at [facility]. She has been visiting sister every other day. She is stressed because she feels she needs to prepare house for sister's return. [name] says that she has been exhausted lately and having a hard time getting things done. She does say her sleep is slightly better, but she remains tired much of the time. She has shifted much of her anxiety away from politics and is now mostly focussed on sister.                  `                                                                                                                                         `  Garrel Ridgel, PhD  Time: 3:15p-4:00p 45  minutes

## 2023-02-07 ENCOUNTER — Telehealth: Payer: Self-pay

## 2023-02-07 NOTE — Telephone Encounter (Signed)
Transition Care Management Unsuccessful Follow-up Telephone Call  Date of discharge and from where:  01/17/2023 [facility]  Attempts:  1st Attempt  Reason for unsuccessful TCM follow-up call:  No answer/busy voicemail did not pickup.  Jonte Wollam [facility]  [facility], [facility] Guide Direct Dial: [phone]  Website: [email or web address]

## 2023-02-07 NOTE — Telephone Encounter (Signed)
Transition Care Management Unsuccessful Follow-up Telephone Call  Date of discharge and from where:  01/17/2023 [facility]  Attempts:  2nd Attempt  Reason for unsuccessful TCM follow-up call:  No answer/busy  Adjoa Althouse [facility]  [facility], [facility] Resource Care Guide Direct Dial: [phone]  Website: [email or web address]

## 2023-02-10 DIAGNOSIS — G4733 Obstructive sleep apnea (adult) (pediatric): Secondary | ICD-10-CM | POA: Diagnosis not present

## 2023-02-13 ENCOUNTER — Ambulatory Visit: Payer: Medicare PPO | Admitting: Psychology

## 2023-02-13 DIAGNOSIS — F411 Generalized anxiety disorder: Secondary | ICD-10-CM | POA: Diagnosis not present

## 2023-02-13 NOTE — Progress Notes (Signed)
Date: 02/13/2023  Diagnosis G47.00 (Insomnia disorder) [n/a]  300.02 (Generalized anxiety disorder) [n/a]  Symptoms Complains of difficulty remaining asleep. (Status: maintained) -- No Description Entered  Excessive and/or unrealistic worry that is difficult to control occurring more days than not for at least 6 months about a number of events or activities. (Status: maintained) -- No Description Entered  Medication Status compliance  Safety none  If Suicidal or Homicidal State Action Taken: unspecified  Current Risk: low Medications unspecified Objectives Related Problem: Resolve the core conflict that is the source of anxiety. Description: Describe situations, thoughts, feelings, and actions associated with anxieties and worries, their impact on functioning, and attempts to resolve them. Target Date: 2024-02-13 Frequency: Daily Modality: individual Progress: 85%  Related Problem: Resolve the core conflict that is the source of anxiety. Description: Learn and implement calming skills to reduce overall anxiety and manage anxiety symptoms. Target Date: 2024-02-13 Frequency: Daily Modality: individual Progress: 80%  Related Problem: Resolve the core conflict that is the source of anxiety. Description: Learn and implement problem-solving strategies for realistically addressing worries. Target Date: 2024-02-13 Frequency: Daily Modality: individual Progress: 85%  Related Problem: Resolve the core conflict that is the source of anxiety. Description: Maintain involvement in work, family, and social activities. Target Date: 2024-02-13 Frequency: Daily Modality: individual Progress: 85%  Related Problem: Restore restful sleep pattern. Description: Describe the history and details of sleep pattern. Target Date: 2024-02-13 Frequency: Daily Modality: individual Progress: 90%    Client Response full compliance  Service Location Location, [address] [place], [place] [address]  Service Code cpt [phone] P Self-monitoring  Lifestyle change (exercise, nutrition)  Self care activities  Distress tolerance skill  Identify/label emotions  Identify automatic thoughts  Facilitate problem solving  Normalize/Reframe  Behavioral activation plan  Validate/empathize  Comments  Dx.: Generalized Anxiety (F41.1)  Meds: No psychotropics Goals: [name] is seeking to develop a more satisfying life-style that takes into account her current physical condition. Goal date 11-04-20. States that the current situation in the world (politics and Covid 19) is making her very anxious. She is "obsessed" with the news (social media) and remains in a constant state of stress. She wants to learn to be less reactive. Will initiate behaviorally oriented individual therapy to address symptoms. Goal date is 10-04-21. While patient has realized some improvement in level of anxiety, she still expresses considerable stress and anxiety. Continues to need better balance in life. Revised date is 12-25.  Patient agrees to a Engineering geologist video session and understands the limitations of this platform. She is at home and I am at my home office.   [name] says that her sister is still at rehab. [name] is worried about her and fears that one day her sister will pre-decease her. She states she would struggle to live without her sister. Her fears are not currently an issue in that her sister is in basically good health other than her recent orthopedic issue. She expresses her overall concern about not only her sister, but for everyone who is living in the current political climate. Is accepting that she is limited in what she can do.                      `                                                                                                                                         `  Garrel Ridgel,  PhD  Time: 3:15p-4:00p 45 minutes                            Garrel Ridgel, PhD

## 2023-02-20 ENCOUNTER — Ambulatory Visit: Payer: Medicare PPO | Admitting: Psychology

## 2023-02-20 DIAGNOSIS — G4733 Obstructive sleep apnea (adult) (pediatric): Secondary | ICD-10-CM | POA: Diagnosis not present

## 2023-02-20 DIAGNOSIS — F411 Generalized anxiety disorder: Secondary | ICD-10-CM

## 2023-02-20 NOTE — Progress Notes (Signed)
Date: 02/20/2023  Diagnosis G47.00 (Insomnia disorder) [n/a]  300.02 (Generalized anxiety disorder) [n/a]  Symptoms Complains of difficulty remaining asleep. (Status: maintained) -- No Description Entered  Excessive and/or unrealistic worry that is difficult to control occurring more days than not for at least 6 months about a number of events or activities. (Status: maintained) -- No Description Entered  Medication Status compliance  Safety none  If Suicidal or Homicidal State Action Taken: unspecified  Current Risk: low Medications unspecified Objectives Related Problem: Resolve the core conflict that is the source of anxiety. Description: Describe situations, thoughts, feelings, and actions associated with anxieties and worries, their impact on functioning, and attempts to resolve them. Target Date: 2024-02-13 Frequency: Daily Modality: individual Progress: 85%  Related Problem: Resolve the core conflict that is the source of anxiety. Description: Learn and implement calming skills to reduce overall anxiety and manage anxiety symptoms. Target Date: 2024-02-13 Frequency: Daily Modality: individual Progress: 80%  Related Problem: Resolve the core conflict that is the source of anxiety. Description: Learn and implement problem-solving strategies for realistically addressing worries. Target Date: 2024-02-13 Frequency: Daily Modality: individual Progress: 85%  Related Problem: Resolve the core conflict that is the source of anxiety. Description: Maintain involvement in work, family, and social activities. Target Date: 2024-02-13 Frequency: Daily Modality: individual Progress: 85%  Related Problem: Restore restful sleep pattern. Description: Describe the history and details of sleep pattern. Target Date: 2024-02-13 Frequency: Daily Modality: individual Progress: 90%    Client Response full compliance  Service  Location Location, [address] [place], [place] [address]  Service Code cpt [phone] P Self-monitoring  Lifestyle change (exercise, nutrition)  Self care activities  Distress tolerance skill  Identify/label emotions  Identify automatic thoughts  Facilitate problem solving  Normalize/Reframe  Behavioral activation plan  Validate/empathize  Comments  Dx.: Generalized Anxiety (F41.1)  Meds: No psychotropics Goals: Levenia is seeking to develop a more satisfying life-style that takes into account her current physical condition. Goal date 11-04-20. States that the current situation in the world (politics and Covid 19) is making her very anxious. She is "obsessed" with the news (social media) and remains in a constant state of stress. She wants to learn to be less reactive. Will initiate behaviorally oriented individual therapy to address symptoms. Goal date is 10-04-21. While patient has realized some improvement in level of anxiety, she still expresses considerable stress and anxiety. Continues to need better balance in life. Revised date is 12-25.  Patient agrees to a Engineering geologist video session and understands the limitations of this platform. She is at home and I am at my home office.   Talyssa says her sister is scheduled to come home soon. She is very anxious and concerned whether she will be able to adequately care for her. Her anxiety is high, as is her blood pressure. Discussed whether she should consult with doctor about anxiety medication. She is "scared" about her sister coming home and I told her she needs to find out exactly what her sister will need upon her return home. That will help her determine what she can and cannot provide. We practiced diaphragmatic breathing.                         `                                                                                                                                         `  Garrel Ridgel, PhD  Time: 3:15p-4:00p 45 minutes

## 2023-02-27 ENCOUNTER — Ambulatory Visit: Payer: Medicare PPO | Admitting: Psychology

## 2023-03-06 ENCOUNTER — Ambulatory Visit: Payer: Medicare PPO | Admitting: Psychology

## 2023-03-06 DIAGNOSIS — F411 Generalized anxiety disorder: Secondary | ICD-10-CM

## 2023-03-06 NOTE — Progress Notes (Signed)
 Date: 03/06/2023  Diagnosis G47.00 (Insomnia disorder) [n/a]  300.02 (Generalized anxiety disorder) [n/a]  Symptoms Complains of difficulty remaining asleep. (Status: maintained) -- No Description Entered  Excessive and/or unrealistic worry that is difficult to control occurring more days than not for at least 6 months about a number of events or activities. (Status: maintained) -- No Description Entered  Medication Status compliance  Safety none  If Suicidal or Homicidal State Action Taken: unspecified  Current Risk: low Medications unspecified Objectives Related Problem: Resolve the core conflict that is the source of anxiety. Description: Describe situations, thoughts, feelings, and actions associated with anxieties and worries, their impact on functioning, and attempts to resolve them. Target Date: 2024-02-13 Frequency: Daily Modality: individual Progress: 85%  Related Problem: Resolve the core conflict that is the source of anxiety. Description: Learn and implement calming skills to reduce overall anxiety and manage anxiety symptoms. Target Date: 2024-02-13 Frequency: Daily Modality: individual Progress: 80%  Related Problem: Resolve the core conflict that is the source of anxiety. Description: Learn and implement problem-solving strategies for realistically addressing worries. Target Date: 2024-02-13 Frequency: Daily Modality: individual Progress: 85%  Related Problem: Resolve the core conflict that is the source of anxiety. Description: Maintain involvement in work, family, and social activities. Target Date: 2024-02-13 Frequency: Daily Modality: individual Progress: 85%  Related Problem: Restore restful sleep pattern. Description: Describe the history and details of sleep pattern. Target Date: 2024-02-13 Frequency: Daily Modality: individual Progress: 90%    Client Response full  compliance  Service Location Location, [address] [place], [place] [address]  Service Code cpt [phone] P Self-monitoring  Lifestyle change (exercise, nutrition)  Self care activities  Distress tolerance skill  Identify/label emotions  Identify automatic thoughts  Facilitate problem solving  Normalize/Reframe  Behavioral activation plan  Validate/empathize  Comments  Dx.: Generalized Anxiety (F41.1)  Meds: No psychotropics Goals: [name] is seeking to develop a more satisfying life-style that takes into account her current physical condition. Goal date 11-04-20. States that the current situation in the world (politics and Covid 19) is making her very anxious. She is obsessed with the news (social media) and remains in a constant state of stress. She wants to learn to be less reactive. Will initiate behaviorally oriented individual therapy to address symptoms. Goal date is 10-04-21. While patient has realized some improvement in level of anxiety, she still expresses considerable stress and anxiety. Continues to need better balance in life. Revised date is 12-25.  Patient agrees to a Engineering Geologist video session and understands the limitations of this platform. She is at home and I am at my home office.   [name] says that her sister is doing much better and should be out of nursing home next week. She is off walker and on a cane now. [name] is relieved and looking forward to her coming home. She is worried that her sister will have needs beyond what she can help with. She bought her sister an new recliner because that is where she sleeps. [name] talked about her own health concerns and fears of falling. Talked about keeping her anxiety at bay and making good, well thought-out decisions.                             `                                                                                                                                         `  CONI ALM KERNS, PhD  Time: 3:10p-4:00p 50 minutes

## 2023-03-13 ENCOUNTER — Ambulatory Visit: Payer: Medicare PPO | Admitting: Psychology

## 2023-03-13 DIAGNOSIS — F411 Generalized anxiety disorder: Secondary | ICD-10-CM

## 2023-03-13 DIAGNOSIS — G4733 Obstructive sleep apnea (adult) (pediatric): Secondary | ICD-10-CM | POA: Diagnosis not present

## 2023-03-13 NOTE — Progress Notes (Signed)
 Date: 03/13/2023  Diagnosis G47.00 (Insomnia disorder) [n/a]  300.02 (Generalized anxiety disorder) [n/a]  Symptoms Complains of difficulty remaining asleep. (Status: maintained) -- No Description Entered  Excessive and/or unrealistic worry that is difficult to control occurring more days than not for at least 6 months about a number of events or activities. (Status: maintained) -- No Description Entered  Medication Status compliance  Safety none  If Suicidal or Homicidal State Action Taken: unspecified  Current Risk: low Medications unspecified Objectives Related Problem: Resolve the core conflict that is the source of anxiety. Description: Describe situations, thoughts, feelings, and actions associated with anxieties and worries, their impact on functioning, and attempts to resolve them. Target Date: 2024-02-13 Frequency: Daily Modality: individual Progress: 85%  Related Problem: Resolve the core conflict that is the source of anxiety. Description: Learn and implement calming skills to reduce overall anxiety and manage anxiety symptoms. Target Date: 2024-02-13 Frequency: Daily Modality: individual Progress: 80%  Related Problem: Resolve the core conflict that is the source of anxiety. Description: Learn and implement problem-solving strategies for realistically addressing worries. Target Date: 2024-02-13 Frequency: Daily Modality: individual Progress: 85%  Related Problem: Resolve the core conflict that is the source of anxiety. Description: Maintain involvement in work, family, and social activities. Target Date: 2024-02-13 Frequency: Daily Modality: individual Progress: 85%  Related Problem: Restore restful sleep pattern. Description: Describe the history and details of sleep pattern. Target Date: 2024-02-13 Frequency: Daily Modality: individual Progress:  90%    Client Response full compliance  Service Location Location, [address] [place], [place] [address]  Service Code cpt [phone] P Self-monitoring  Lifestyle change (exercise, nutrition)  Self care activities  Distress tolerance skill  Identify/label emotions  Identify automatic thoughts  Facilitate problem solving  Normalize/Reframe  Behavioral activation plan  Validate/empathize  Comments  Dx.: Generalized Anxiety (F41.1)  Meds: No psychotropics Goals: [name] is seeking to develop a more satisfying life-style that takes into account her current physical condition. Goal date 11-04-20. States that the current situation in the world (politics and Covid 19) is making her very anxious. She is obsessed with the news (social media) and remains in a constant state of stress. She wants to learn to be less reactive. Will initiate behaviorally oriented individual therapy to address symptoms. Goal date is 10-04-21. While patient has realized some improvement in level of anxiety, she still expresses considerable stress and anxiety. Continues to need better balance in life. Revised date is 12-25.  Patient agrees to a Engineering Geologist video session and understands the limitations of this platform. She is at home and I am at my home office.   [name] says that [name] is going to assisted living for a couple of months. She is unable to come home because she needs too much assistance. She is orchestrating the move with the help of her niece visiting from out of town. We talked about how she can best manage without having her sister at home. She does plan to continue going to some activities in the community. She talked about her concerns for the future with regard to her healthcare and living circumstances. She is, for the first time, questioning aging in place and whether that  will be an option for her.                               `                                                                                                                                          `                                                                              CONI ALM KERNS, PhD  Time: 3:15p-4:00p 45 minutes

## 2023-03-19 ENCOUNTER — Telehealth: Payer: Self-pay | Admitting: Family Medicine

## 2023-03-19 NOTE — Telephone Encounter (Signed)
 Copied from CRM [id number]. Topic: Clinical - Prescription Issue >> Mar 16, 2023  4:22 PM Prudencio Pair wrote: Reason for CRM: Tiffany, from the pharmacy, called stating that they are needing a prescription for Accu-Check Guide test strips & lancets for pt.

## 2023-03-19 NOTE — Telephone Encounter (Signed)
 Patient is not established with our office. Her PCP is not a part of [facility].

## 2023-03-20 ENCOUNTER — Ambulatory Visit: Payer: Medicare PPO | Admitting: Psychology

## 2023-03-27 ENCOUNTER — Ambulatory Visit: Payer: Medicare PPO | Admitting: Psychology

## 2023-03-27 DIAGNOSIS — F411 Generalized anxiety disorder: Secondary | ICD-10-CM | POA: Diagnosis not present

## 2023-03-27 DIAGNOSIS — G4700 Insomnia, unspecified: Secondary | ICD-10-CM | POA: Diagnosis not present

## 2023-03-27 NOTE — Progress Notes (Signed)
Date: 03/27/2023  Diagnosis G47.00 (Insomnia disorder) [n/a]  300.02 (Generalized anxiety disorder) [n/a]  Symptoms Complains of difficulty remaining asleep. (Status: maintained) -- No Description Entered  Excessive and/or unrealistic worry that is difficult to control occurring more days than not for at least 6 months about a number of events or activities. (Status: maintained) -- No Description Entered  Medication Status compliance  Safety none  If Suicidal or Homicidal State Action Taken: unspecified  Current Risk: low Medications unspecified Objectives Related Problem: Resolve the core conflict that is the source of anxiety. Description: Describe situations, thoughts, feelings, and actions associated with anxieties and worries, their impact on functioning, and attempts to resolve them. Target Date: 2024-02-13 Frequency: Daily Modality: individual Progress: 85%  Related Problem: Resolve the core conflict that is the source of anxiety. Description: Learn and implement calming skills to reduce overall anxiety and manage anxiety symptoms. Target Date: 2024-02-13 Frequency: Daily Modality: individual Progress: 80%  Related Problem: Resolve the core conflict that is the source of anxiety. Description: Learn and implement problem-solving strategies for realistically addressing worries. Target Date: 2024-02-13 Frequency: Daily Modality: individual Progress: 85%  Related Problem: Resolve the core conflict that is the source of anxiety. Description: Maintain involvement in work, family, and social activities. Target Date: 2024-02-13 Frequency: Daily Modality: individual Progress: 85%  Related Problem: Restore restful sleep pattern. Description: Describe the history and details of sleep pattern. Target Date: 2024-02-13 Frequency: Daily Modality:  individual Progress: 90%    Client Response full compliance  Service Location Location, [address] [place], [place] [address]  Service Code cpt [phone] P Self-monitoring  Lifestyle change (exercise, nutrition)  Self care activities  Distress tolerance skill  Identify/label emotions  Identify automatic thoughts  Facilitate problem solving  Normalize/Reframe  Behavioral activation plan  Validate/empathize  Comments  Dx.: Generalized Anxiety (F41.1)  Meds: No psychotropics Goals: [name] is seeking to develop a more satisfying life-style that takes into account her current physical condition. Goal date 11-04-20. States that the current situation in the world (politics and Covid 19) is making her very anxious. She is "obsessed" with the news (social media) and remains in a constant state of stress. She wants to learn to be less reactive. Will initiate behaviorally oriented individual therapy to address symptoms. Goal date is 10-04-21. While patient has realized some improvement in level of anxiety, she still expresses considerable stress and anxiety. Continues to need better balance in life. Revised date is 12-25.  Patient agrees to a Engineering geologist video session and understands the limitations of this platform. She is at home and I am at my home office.   [name] says that her sister left skilled nursing and is in rehab facility (assisted living) for the past week. She is exercising daily with the hopes of a discharge. [name] is visiting her every other day. She continues to prepare her home for her sister's return. [name] says that she is working to stay calm even though she is very distressed by the current political environment. We talked about trying to focus on positives in her life and not spend time distressed  over the things she cannot change.                                 `                                                                                                                                          `                                                                              Garrel Ridgel, PhD  Time: 3:15p-4:00p 45 minutes

## 2023-04-03 ENCOUNTER — Ambulatory Visit (INDEPENDENT_AMBULATORY_CARE_PROVIDER_SITE_OTHER): Payer: Medicare PPO | Admitting: Psychology

## 2023-04-03 DIAGNOSIS — F411 Generalized anxiety disorder: Secondary | ICD-10-CM | POA: Diagnosis not present

## 2023-04-03 NOTE — Progress Notes (Signed)
Date: 04/03/2023  Diagnosis G47.00 (Insomnia disorder) [n/a]  300.02 (Generalized anxiety disorder) [n/a]  Symptoms Complains of difficulty remaining asleep. (Status: maintained) -- No Description Entered  Excessive and/or unrealistic worry that is difficult to control occurring more days than not for at least 6 months about a number of events or activities. (Status: maintained) -- No Description Entered  Medication Status compliance  Safety none  If Suicidal or Homicidal State Action Taken: unspecified  Current Risk: low Medications unspecified Objectives Related Problem: Resolve the core conflict that is the source of anxiety. Description: Describe situations, thoughts, feelings, and actions associated with anxieties and worries, their impact on functioning, and attempts to resolve them. Target Date: 2024-02-13 Frequency: Daily Modality: individual Progress: 85%  Related Problem: Resolve the core conflict that is the source of anxiety. Description: Learn and implement calming skills to reduce overall anxiety and manage anxiety symptoms. Target Date: 2024-02-13 Frequency: Daily Modality: individual Progress: 80%  Related Problem: Resolve the core conflict that is the source of anxiety. Description: Learn and implement problem-solving strategies for realistically addressing worries. Target Date: 2024-02-13 Frequency: Daily Modality: individual Progress: 85%  Related Problem: Resolve the core conflict that is the source of anxiety. Description: Maintain involvement in work, family, and social activities. Target Date: 2024-02-13 Frequency: Daily Modality: individual Progress: 85%  Related Problem: Restore restful sleep pattern. Description: Describe the history and details of sleep pattern. Target Date:  2024-02-13 Frequency: Daily Modality: individual Progress: 90%    Client Response full compliance  Service Location Location, [address] [place], [place] [address]  Service Code cpt [phone] P Self-monitoring  Lifestyle change (exercise, nutrition)  Self care activities  Distress tolerance skill  Identify/label emotions  Identify automatic thoughts  Facilitate problem solving  Normalize/Reframe  Behavioral activation plan  Validate/empathize  Comments  Dx.: Generalized Anxiety (F41.1)  Meds: No psychotropics Goals: [name] is seeking to develop a more satisfying life-style that takes into account her current physical condition. Goal date 11-04-20. States that the current situation in the world (politics and Covid 19) is making her very anxious. She is "obsessed" with the news (social media) and remains in a constant state of stress. She wants to learn to be less reactive. Will initiate behaviorally oriented individual therapy to address symptoms. Goal date is 10-04-21. While patient has realized some improvement in level of anxiety, she still expresses considerable stress and anxiety. Continues to need better balance in life. Revised date is 12-25.  Patient agrees to a Engineering geologist video session and understands the limitations of this platform. She is at home and I am at my home office.   [name] she has been sick last few days. States she is worn down. She is encouraged that her sister is doing better in rehab and she is walking much better. She is hoping to prepare her house for when her sister comes back from rehab. She hasn't seen sister as much due to her illness. Is regularly texting with each other.She talked about her ongoing upset  about the current political landscape in [place].                                      `                                                                                                                                         `                                                                               Garrel Ridgel, PhD  Time: 3:15p-4:00p 45 minutes

## 2023-04-10 ENCOUNTER — Ambulatory Visit: Payer: Medicare PPO | Admitting: Psychology

## 2023-04-10 DIAGNOSIS — F411 Generalized anxiety disorder: Secondary | ICD-10-CM | POA: Diagnosis not present

## 2023-04-10 NOTE — Progress Notes (Signed)
 Date: 04/10/2023  Diagnosis G47.00 (Insomnia disorder) [n/a]  300.02 (Generalized anxiety disorder) [n/a]  Symptoms Complains of difficulty remaining asleep. (Status: maintained) -- No Description Entered  Excessive and/or unrealistic worry that is difficult to control occurring more days than not for at least 6 months about a number of events or activities. (Status: maintained) -- No Description Entered  Medication Status compliance  Safety none  If Suicidal or Homicidal State Action Taken: unspecified  Current Risk: low Medications unspecified Objectives Related Problem: Resolve the core conflict that is the source of anxiety. Description: Describe situations, thoughts, feelings, and actions associated with anxieties and worries, their impact on functioning, and attempts to resolve them. Target Date: 2024-02-13 Frequency: Daily Modality: individual Progress: 85%  Related Problem: Resolve the core conflict that is the source of anxiety. Description: Learn and implement calming skills to reduce overall anxiety and manage anxiety symptoms. Target Date: 2024-02-13 Frequency: Daily Modality: individual Progress: 80%  Related Problem: Resolve the core conflict that is the source of anxiety. Description: Learn and implement problem-solving strategies for realistically addressing worries. Target Date: 2024-02-13 Frequency: Daily Modality: individual Progress: 85%  Related Problem: Resolve the core conflict that is the source of anxiety. Description: Maintain involvement in work, family, and social activities. Target Date: 2024-02-13 Frequency: Daily Modality: individual Progress: 85%  Related Problem: Restore restful sleep pattern. Description: Describe the history and details of sleep pattern. Target Date:  2024-02-13 Frequency: Daily Modality: individual Progress: 90%    Client Response full compliance  Service Location Location, [address] [place], [place] [address]  Service Code cpt [phone] P Self-monitoring  Lifestyle change (exercise, nutrition)  Self care activities  Distress tolerance skill  Identify/label emotions  Identify automatic thoughts  Facilitate problem solving  Normalize/Reframe  Behavioral activation plan  Validate/empathize  Comments  Dx.: Generalized Anxiety (F41.1)  Meds: No psychotropics Goals: [name] is seeking to develop a more satisfying life-style that takes into account her current physical condition. Goal date 11-04-20. States that the current situation in the world (politics and Covid 19) is making her very anxious. She is obsessed with the news (social media) and remains in a constant state of stress. She wants to learn to be less reactive. Will initiate behaviorally oriented individual therapy to address symptoms. Goal date is 10-04-21. While patient has realized some improvement in level of anxiety, she still expresses considerable stress and anxiety. Continues to need better balance in life. Revised date is 12-25.  Patient agrees to a Engineering Geologist video session and understands the limitations of this platform. She is at home and I am at my home office.   Irelyn says she is getting more upset with the political climate. She recognizes that she needs to let go and move on. We talked about ways to aoid getting obsessed and consumed with political news. Balinda is hoping her sister will be coming home by March. She is considering preparations. She wants her home but has a number of fears about her sister's transition and safety. With  the exception of her political obsessions, she is relatively emotionally stable.                                         `                                                                                                                                          `                                                                              CONI ALM KERNS, PhD  Time: 3:15p-4:00p 45 minutes                                                                                                                              Date: 04/10/2023  Diagnosis G47.00 (Insomnia disorder) [n/a]  300.02 (Generalized anxiety disorder) [n/a]  Symptoms Complains of difficulty remaining asleep. (Status: maintained) -- No Description Entered  Excessive and/or unrealistic worry that is difficult to control occurring more days than not for at least 6 months about a number of events or activities. (Status: maintained) -- No Description Entered  Medication Status compliance  Safety none  If Suicidal or Homicidal State Action Taken: unspecified  Current Risk: low Medications unspecified Objectives Related Problem: Resolve the core conflict that is the source of anxiety. Description: Describe situations, thoughts, feelings, and actions associated with anxieties and worries, their impact on functioning, and attempts to resolve them. Target Date: 2024-02-13 Frequency: Daily Modality: individual Progress: 85%  Related Problem: Resolve the core conflict that is the source of anxiety. Description: Learn and implement calming skills to reduce overall anxiety and manage anxiety symptoms. Target Date: 2024-02-13 Frequency: Daily Modality: individual Progress: 80%  Related Problem: Resolve the core conflict that is the source of anxiety. Description: Learn and implement problem-solving strategies for realistically addressing worries. Target Date: 2024-02-13 Frequency: Daily Modality: individual Progress: 85%  Related Problem: Resolve the core conflict  that is the source of anxiety. Description: Maintain  involvement in work, family, and social activities. Target Date: 2024-02-13 Frequency: Daily Modality: individual Progress: 85%  Related Problem: Restore restful sleep pattern. Description: Describe the history and details of sleep pattern. Target Date: 2024-02-13 Frequency: Daily Modality: individual Progress: 90%    Client Response full compliance  Service Location Location, [address] [place], [place] [address]  Service Code cpt [phone] P Self-monitoring  Lifestyle change (exercise, nutrition)  Self care activities  Distress tolerance skill  Identify/label emotions  Identify automatic thoughts  Facilitate problem solving  Normalize/Reframe  Behavioral activation plan  Validate/empathize  Comments  Dx.: Generalized Anxiety (F41.1)  Meds: No psychotropics Goals: [name] is seeking to develop a more satisfying life-style that takes into account her current physical condition. Goal date 11-04-20. States that the current situation in the world (politics and Covid 19) is making her very anxious. She is obsessed with the news (social media) and remains in a constant state of stress. She wants to learn to be less reactive. Will initiate behaviorally oriented individual therapy to address symptoms. Goal date is 10-04-21. While patient has realized some improvement in level of anxiety, she still expresses considerable stress and anxiety. Continues to need better balance in life. Revised date is 12-25.  Patient agrees to a Engineering Geologist video session and understands the limitations of this platform. She is at home and I am at my home office.   Shuntae                                       `                                                                                                                                         `                                                                              CONI ALM KERNS, PhD  Time: 3:05p-4:00p 55 minutes

## 2023-04-13 DIAGNOSIS — G4733 Obstructive sleep apnea (adult) (pediatric): Secondary | ICD-10-CM | POA: Diagnosis not present

## 2023-04-17 ENCOUNTER — Ambulatory Visit: Payer: Medicare PPO | Admitting: Psychology

## 2023-04-17 DIAGNOSIS — F411 Generalized anxiety disorder: Secondary | ICD-10-CM

## 2023-04-17 DIAGNOSIS — G4700 Insomnia, unspecified: Secondary | ICD-10-CM | POA: Diagnosis not present

## 2023-04-17 NOTE — Progress Notes (Signed)
Date: 04/17/2023  Diagnosis G47.00 (Insomnia disorder) [n/a]  300.02 (Generalized anxiety disorder) [n/a]  Symptoms Complains of difficulty remaining asleep. (Status: maintained) -- No Description Entered  Excessive and/or unrealistic worry that is difficult to control occurring more days than not for at least 6 months about a number of events or activities. (Status: maintained) -- No Description Entered  Medication Status compliance  Safety none  If Suicidal or Homicidal State Action Taken: unspecified  Current Risk: low Medications unspecified Objectives Related Problem: Resolve the core conflict that is the source of anxiety. Description: Describe situations, thoughts, feelings, and actions associated with anxieties and worries, their impact on functioning, and attempts to resolve them. Target Date: 2024-02-13 Frequency: Daily Modality: individual Progress: 85%  Related Problem: Resolve the core conflict that is the source of anxiety. Description: Learn and implement calming skills to reduce overall anxiety and manage anxiety symptoms. Target Date: 2024-02-13 Frequency: Daily Modality: individual Progress: 80%  Related Problem: Resolve the core conflict that is the source of anxiety. Description: Learn and implement problem-solving strategies for realistically addressing worries. Target Date: 2024-02-13 Frequency: Daily Modality: individual Progress: 85%  Related Problem: Resolve the core conflict that is the source of anxiety. Description: Maintain involvement in work, family, and social activities. Target Date: 2024-02-13 Frequency: Daily Modality: individual Progress: 85%  Related Problem: Restore restful sleep pattern. Description: Describe the history and details of sleep  pattern. Target Date: 2024-02-13 Frequency: Daily Modality: individual Progress: 90%    Client Response full compliance  Service Location Location, [address] [place], [place] [address]  Service Code cpt [phone] P Self-monitoring  Lifestyle change (exercise, nutrition)  Self care activities  Distress tolerance skill  Identify/label emotions  Identify automatic thoughts  Facilitate problem solving  Normalize/Reframe  Behavioral activation plan  Validate/empathize  Comments  Dx.: Generalized Anxiety (F41.1)  Meds: No psychotropics Goals: Bayli is seeking to develop a more satisfying life-style that takes into account her current physical condition. Goal date 11-04-20. States that the current situation in the world (politics and Covid 19) is making her very anxious. She is "obsessed" with the news (social media) and remains in a constant state of stress. She wants to learn to be less reactive. Will initiate behaviorally oriented individual therapy to address symptoms. Goal date is 10-04-21. While patient has realized some improvement in level of anxiety, she still expresses considerable stress and anxiety. Continues to need better balance in life. Revised date is 12-25.  Patient agrees to a Engineering geologist video session and understands the limitations of this platform. She is at home and I am at my home office.   Clella says she is getting more upset with the political climate. She recognizes that she needs to "let go" and move on. We talked about ways to aoid getting obsessed and consumed with political news. Sarabella is hoping her sister will be coming home by March. She is considering preparations. She wants  her home but has a number of fears about her sister's transition and safety. With the exception of her political obsessions, she is relatively emotionally stable. She went to a luncheon at [employer] today. She is managing to get out of the house, which is very positive.                                           `                                                                                                                                         `                                                                              Garrel Ridgel, PhD  Time: 3:15p-4:00p 45 minutes                                                                                                                              Date: 04/17/2023  Diagnosis G47.00 (Insomnia disorder) [n/a]  300.02 (Generalized anxiety disorder) [n/a]  Symptoms Complains of difficulty remaining asleep. (Status: maintained) -- No Description Entered  Excessive and/or unrealistic worry that is difficult to control occurring more days than not for at least 6 months about a number of events or activities. (Status: maintained) -- No Description Entered  Medication Status compliance  Safety none  If Suicidal or Homicidal State Action Taken: unspecified  Current Risk: low Medications unspecified Objectives Related Problem: Resolve the core conflict that is the source of anxiety. Description: Describe situations, thoughts, feelings, and actions associated with anxieties and worries, their impact on functioning, and attempts to resolve them. Target Date: 2024-02-13 Frequency: Daily Modality: individual Progress: 85%  Related Problem: Resolve the core conflict that is the source of anxiety. Description: Learn and implement calming skills to reduce overall anxiety and manage anxiety symptoms. Target Date: 2024-02-13 Frequency: Daily Modality: individual Progress: 80%  Related Problem: Resolve the core conflict that is the source of anxiety. Description: Learn and implement problem-solving strategies for realistically addressing worries. Target Date: 2024-02-13 Frequency:  Daily Modality: individual Progress: 85%  Related Problem: Resolve the core conflict that is the source of anxiety. Description: Maintain involvement in work, family, and social activities. Target Date: 2024-02-13 Frequency: Daily Modality: individual Progress: 85%  Related Problem: Restore restful sleep pattern. Description: Describe the history and details of sleep pattern. Target Date: 2024-02-13 Frequency: Daily Modality: individual Progress: 90%    Client Response full compliance  Service Location Location, [address] [place], [place] [address]  Service Code cpt [phone] P Self-monitoring  Lifestyle change (exercise, nutrition)  Self care activities  Distress tolerance skill  Identify/label emotions  Identify automatic thoughts  Facilitate problem solving  Normalize/Reframe  Behavioral activation plan  Validate/empathize  Comments  Dx.: Generalized Anxiety (F41.1)  Meds: No psychotropics Goals: Shekita is seeking to develop a more satisfying life-style that takes into account her current physical condition. Goal date 11-04-20. States that the current situation in the world (politics and Covid 19) is making her very anxious. She is "obsessed" with the news (social media) and remains in a constant state of stress. She wants to learn to be less reactive. Will initiate behaviorally oriented individual therapy to address symptoms. Goal date is 10-04-21. While patient has realized some improvement in level of anxiety, she still expresses considerable stress and anxiety. Continues to need better balance in life. Revised date is 12-25.   Patient agrees to a Engineering geologist video session and understands the limitations of this platform. She is at home and I am at my home office.   Aracely states she is very distressed about the state of the country.                                         `                                                                                                                                          `                                                                              Garrel Ridgel, PhD  Time: 3:10p-4:00p 50 minutes

## 2023-04-24 ENCOUNTER — Ambulatory Visit: Payer: Medicare PPO | Admitting: Psychology

## 2023-04-24 DIAGNOSIS — F411 Generalized anxiety disorder: Secondary | ICD-10-CM | POA: Diagnosis not present
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
## 2023-05-01 ENCOUNTER — Ambulatory Visit: Payer: Medicare PPO | Admitting: Psychology

## 2023-05-01 DIAGNOSIS — G4700 Insomnia, unspecified: Secondary | ICD-10-CM

## 2023-05-01 DIAGNOSIS — F411 Generalized anxiety disorder: Secondary | ICD-10-CM | POA: Diagnosis not present

## 2023-05-01 NOTE — Progress Notes (Addendum)
 Date: 05/01/2023  Diagnosis G47.00 (Insomnia disorder) [n/a]  300.02 (Generalized anxiety disorder) [n/a]  Symptoms Complains of difficulty remaining asleep. (Status: maintained) -- No Description Entered  Excessive and/or unrealistic worry that is difficult to control occurring more days than not for at least 6 months about a number of events or activities. (Status: maintained) -- No Description Entered  Medication Status compliance  Safety none  If Suicidal or Homicidal State Action Taken: unspecified  Current Risk: low Medications unspecified Objectives Related Problem: Resolve the core conflict that is the source of anxiety. Description: Describe situations, thoughts, feelings, and actions associated with anxieties and worries, their impact on functioning, and attempts to resolve them. Target Date: 2024-02-13 Frequency: Daily Modality: individual Progress: 85%  Related Problem: Resolve the core conflict that is the source of anxiety. Description: Learn and implement calming skills to reduce overall anxiety and manage anxiety symptoms. Target Date: 2024-02-13 Frequency: Daily Modality: individual Progress: 80%  Related Problem: Resolve the core conflict that is the source of anxiety. Description: Learn and implement problem-solving strategies for realistically addressing worries. Target Date: 2024-02-13 Frequency: Daily Modality: individual Progress: 85%  Related Problem: Resolve the core conflict that is the source of anxiety. Description: Maintain involvement in work, family, and social activities. Target Date: 2024-02-13 Frequency: Daily Modality: individual Progress: 85%  Related Problem: Restore restful sleep pattern. Description: Describe the history and details of sleep pattern. Target Date: 2024-02-13 Frequency: Daily Modality: individual Progress: 90%    Client Response full  compliance  Service Location Location, [address] [place], [place] [address]  Service Code cpt [phone] P Self-monitoring  Lifestyle change (exercise, nutrition)  Self care activities  Distress tolerance skill  Identify/label emotions  Identify automatic thoughts  Facilitate problem solving  Normalize/Reframe  Behavioral activation plan  Validate/empathize  Comments  Dx.: Generalized Anxiety (F41.1)  Meds: No psychotropics  Goals: [name] is seeking to develop a more satisfying life-style that takes into account her current physical condition. Goal date 11-04-20. States that the current situation in the world (politics and Covid 19) is making her very anxious. She is "obsessed" with the news (social media) and remains in a constant state of stress. She wants to learn to be less reactive. Will initiate behaviorally oriented individual therapy to address symptoms. Goal date is 10-04-21. While patient has realized some improvement in level of anxiety, she still expresses considerable stress and anxiety. Continues to need better balance in life. Revised date is 12-25. In addition, she has periodic, unexplained sleep issues that compromise her ability to get sufficient sleep. Seeking to resove these problems with improved behavioral strategies. Goal date 12-25. Patient agrees to a Engineering geologist video session and understands the limitations of this platform. She is at home and I am at my home office.   [name] continues to get more upset with the political climate. She is starting to get consumed as she did in the past, which is not healthy for her. Sister is still in rehab and not sure when she is coming home. [name] is getting lonely and looking forward to having her back. [name] is upset with the facility because she says they run out of food that her sister requests. Will be meeting with the facility director. We discussed what her concerns  are about facility and how to best message that to  the director. No change in sleep behaviors, but will further address in subsequent sessions.                                           `                                                                                                                                         `                                                                              Garrel Ridgel, PhD  Time: 3:15p-4:00p 45 minutes

## 2023-05-08 ENCOUNTER — Ambulatory Visit: Payer: Medicare PPO | Admitting: Psychology

## 2023-05-08 DIAGNOSIS — G4700 Insomnia, unspecified: Secondary | ICD-10-CM | POA: Diagnosis not present

## 2023-05-08 DIAGNOSIS — F411 Generalized anxiety disorder: Secondary | ICD-10-CM

## 2023-05-08 NOTE — Progress Notes (Signed)
 Date: 05/08/2023  Diagnosis G47.00 (Insomnia disorder) [n/a]  300.02 (Generalized anxiety disorder) [n/a]  Symptoms Complains of difficulty remaining asleep. (Status: maintained) -- No Description Entered  Excessive and/or unrealistic worry that is difficult to control occurring more days than not for at least 6 months about a number of events or activities. (Status: maintained) -- No Description Entered  Medication Status compliance  Safety none  If Suicidal or Homicidal State Action Taken: unspecified  Current Risk: low Medications unspecified Objectives Related Problem: Resolve the core conflict that is the source of anxiety. Description: Describe situations, thoughts, feelings, and actions associated with anxieties and worries, their impact on functioning, and attempts to resolve them. Target Date: 2024-02-13 Frequency: Daily Modality: individual Progress: 85%  Related Problem: Resolve the core conflict that is the source of anxiety. Description: Learn and implement calming skills to reduce overall anxiety and manage anxiety symptoms. Target Date: 2024-02-13 Frequency: Daily Modality: individual Progress: 80%  Related Problem: Resolve the core conflict that is the source of anxiety. Description: Learn and implement problem-solving strategies for realistically addressing worries. Target Date: 2024-02-13 Frequency: Daily Modality: individual Progress: 85%  Related Problem: Resolve the core conflict that is the source of anxiety. Description: Maintain involvement in work, family, and social activities. Target Date: 2024-02-13 Frequency: Daily Modality: individual Progress: 85%  Related Problem: Restore restful sleep pattern. Description: Describe the history and details of sleep pattern. Target Date: 2024-02-13 Frequency: Daily Modality: individual Progress:  90%    Client Response full compliance  Service Location Location, [address] [place], [place] [address]  Service Code cpt [phone] P Self-monitoring  Lifestyle change (exercise, nutrition)  Self care activities  Distress tolerance skill  Identify/label emotions  Identify automatic thoughts  Facilitate problem solving  Normalize/Reframe  Behavioral activation plan  Validate/empathize  Comments  Dx.: Generalized Anxiety (F41.1)  Meds: No psychotropics  Goals: Khalessi is seeking to develop a more satisfying life-style that takes into account her current physical condition. Goal date 11-04-20. States that the current situation in the world (politics and Covid 19) is making her very anxious. She is "obsessed" with the news (social media) and remains in a constant state of stress. She wants to learn to be less reactive. Will initiate behaviorally oriented individual therapy to address symptoms. Goal date is 10-04-21. While patient has realized some improvement in level of anxiety, she still expresses considerable stress and anxiety. Continues to need better balance in life. Revised date is 12-25. In addition, she has periodic, unexplained sleep issues that compromise her ability to get sufficient sleep. Seeking to resove these problems with improved behavioral strategies. Goal date 12-25. Patient agrees to a Engineering geologist video session and understands the limitations of this platform. She is at home and I am at my home office.   Yanis says she still has not called Catering manager of her sister's facility nd she feels guilty. She spoke with the [facility] social worker, she gave her the feedback that she is "exhausting". She is not sure why she was given that feedback. She talked about whether she needs to talk with director. Discussed upside or downside with goals in mind. She fears conflicts, but is mostly  unfounded. Ultimately, decided that she is not going to "run from them". Rather, she will eat  with sister and respond to director if acked.                                             `                                                                                                                                         `                                                                              Garrel Ridgel, PhD  Time: 3:15p-4:00p 45 minutes

## 2023-05-11 DIAGNOSIS — G4733 Obstructive sleep apnea (adult) (pediatric): Secondary | ICD-10-CM | POA: Diagnosis not present

## 2023-05-15 ENCOUNTER — Ambulatory Visit: Payer: Medicare PPO | Admitting: Psychology

## 2023-05-15 DIAGNOSIS — G4700 Insomnia, unspecified: Secondary | ICD-10-CM

## 2023-05-15 DIAGNOSIS — F411 Generalized anxiety disorder: Secondary | ICD-10-CM

## 2023-05-15 NOTE — Progress Notes (Signed)
 Date: 05/15/2023  Diagnosis G47.00 (Insomnia disorder) [n/a]  300.02 (Generalized anxiety disorder) [n/a]  Symptoms Complains of difficulty remaining asleep. (Status: maintained) -- No Description Entered  Excessive and/or unrealistic worry that is difficult to control occurring more days than not for at least 6 months about a number of events or activities. (Status: maintained) -- No Description Entered  Medication Status compliance  Safety none  If Suicidal or Homicidal State Action Taken: unspecified  Current Risk: low Medications unspecified Objectives Related Problem: Resolve the core conflict that is the source of anxiety. Description: Describe situations, thoughts, feelings, and actions associated with anxieties and worries, their impact on functioning, and attempts to resolve them. Target Date: 2024-02-13 Frequency: Daily Modality: individual Progress: 85%  Related Problem: Resolve the core conflict that is the source of anxiety. Description: Learn and implement calming skills to reduce overall anxiety and manage anxiety symptoms. Target Date: 2024-02-13 Frequency: Daily Modality: individual Progress: 80%  Related Problem: Resolve the core conflict that is the source of anxiety. Description: Learn and implement problem-solving strategies for realistically addressing worries. Target Date: 2024-02-13 Frequency: Daily Modality: individual Progress: 85%  Related Problem: Resolve the core conflict that is the source of anxiety. Description: Maintain involvement in work, family, and social activities. Target Date: 2024-02-13 Frequency: Daily Modality: individual Progress: 85%  Related Problem: Restore restful sleep pattern. Description: Describe the history and details of sleep pattern. Target Date: 2024-02-13 Frequency:  Daily Modality: individual Progress: 90%    Client Response full compliance  Service Location Location, [address] [place], [place] [address]  Service Code cpt [phone] P Self-monitoring  Lifestyle change (exercise, nutrition)  Self care activities  Distress tolerance skill  Identify/label emotions  Identify automatic thoughts  Facilitate problem solving  Normalize/Reframe  Behavioral activation plan  Validate/empathize  Comments  Dx.: Generalized Anxiety (F41.1)  Meds: No psychotropics  Goals: [name] is seeking to develop a more satisfying life-style that takes into account her current physical condition. Goal date 11-04-20. States that the current situation in the world (politics and Covid 19) is making her very anxious. She is "obsessed" with the news (social media) and remains in a constant state of stress. She wants to learn to be less reactive. Will initiate behaviorally oriented individual therapy to address symptoms. Goal date is 10-04-21. While patient has realized some improvement in level of anxiety, she still expresses considerable stress and anxiety. Continues to need better balance in life. Revised date is 12-25. In addition, she has periodic, unexplained sleep issues that compromise her ability to get sufficient sleep. Seeking to resove these problems with improved behavioral strategies. Goal date 12-25. Patient agrees to a Engineering geologist video session and understands the limitations of this platform. She is at home and I am at my home office.   [name] talked about anticipating the return of her sister and she is very excited. Remains deeply concerned about the political climate and future. Encouraged her to take action as a means to modulate her distress and feel empowered. She is doing a good job of participating in community activities. Does say she  has concerns that her sister will not be active when she first returns home, but home she will get to that previous  level. Will keep a journal of her anxiety and rate level. Will discuss how best to manage at the next session.                                               `                                                                                                                                         `                                                                              Garrel Ridgel, PhD  Time: 3:15p-4:00p 45 minutes

## 2023-05-22 ENCOUNTER — Ambulatory Visit: Payer: Medicare PPO | Admitting: Psychology

## 2023-05-22 DIAGNOSIS — G4700 Insomnia, unspecified: Secondary | ICD-10-CM

## 2023-05-22 DIAGNOSIS — F411 Generalized anxiety disorder: Secondary | ICD-10-CM | POA: Diagnosis not present

## 2023-05-22 NOTE — Progress Notes (Signed)
 Date: 05/22/2023  Diagnosis G47.00 (Insomnia disorder) [n/a]  300.02 (Generalized anxiety disorder) [n/a]  Symptoms Complains of difficulty remaining asleep. (Status: maintained) -- No Description Entered  Excessive and/or unrealistic worry that is difficult to control occurring more days than not for at least 6 months about a number of events or activities. (Status: maintained) -- No Description Entered  Medication Status compliance  Safety none  If Suicidal or Homicidal State Action Taken: unspecified  Current Risk: low Medications unspecified Objectives Related Problem: Resolve the core conflict that is the source of anxiety. Description: Describe situations, thoughts, feelings, and actions associated with anxieties and worries, their impact on functioning, and attempts to resolve them. Target Date: 2024-02-13 Frequency: Daily Modality: individual Progress: 85%  Related Problem: Resolve the core conflict that is the source of anxiety. Description: Learn and implement calming skills to reduce overall anxiety and manage anxiety symptoms. Target Date: 2024-02-13 Frequency: Daily Modality: individual Progress: 80%  Related Problem: Resolve the core conflict that is the source of anxiety. Description: Learn and implement problem-solving strategies for realistically addressing worries. Target Date: 2024-02-13 Frequency: Daily Modality: individual Progress: 85%  Related Problem: Resolve the core conflict that is the source of anxiety. Description: Maintain involvement in work, family, and social activities. Target Date: 2024-02-13 Frequency: Daily Modality: individual Progress: 85%  Related Problem: Restore restful sleep pattern. Description: Describe the history and details of sleep pattern. Target Date:  2024-02-13 Frequency: Daily Modality: individual Progress: 90%    Client Response full compliance  Service Location Location, [address] [place], [place] [address]  Service Code cpt [phone] P Self-monitoring  Lifestyle change (exercise, nutrition)  Self care activities  Distress tolerance skill  Identify/label emotions  Identify automatic thoughts  Facilitate problem solving  Normalize/Reframe  Behavioral activation plan  Validate/empathize  Comments  Dx.: Generalized Anxiety (F41.1)  Meds: No psychotropics  Goals: [name] is seeking to develop a more satisfying life-style that takes into account her current physical condition. Goal date 11-04-20. States that the current situation in the world (politics and Covid 19) is making her very anxious. She is "obsessed" with the news (social media) and remains in a constant state of stress. She wants to learn to be less reactive. Will initiate behaviorally oriented individual therapy to address symptoms. Goal date is 10-04-21. While patient has realized some improvement in level of anxiety, she still expresses considerable stress and anxiety. Continues to need better balance in life. Revised date is 12-25. In addition, she has periodic, unexplained sleep issues that compromise her ability to get sufficient sleep. Seeking to resove these problems with improved behavioral strategies. Goal date 12-25. Patient agrees to a Engineering geologist video session and understands the limitations of this platform. She is at home and I am at my home office.   [name] states she got very stressed and ended up call EMS due to high BP. She was fine and did not have to go in to the doctor/hospital. She says she is still anxious, but to a  lesser degree. Mostly states her stress is related to the changes in the [place] government. She is preparing for sister to come home, which will help her moods and loneliness, but does create some anxiety. Will employ strategies to  remain calm.                                                 `                                                                                                                                         `                                                                              Garrel Ridgel, PhD  Time: 3:15p-4:00p 45 minutes

## 2023-05-29 ENCOUNTER — Ambulatory Visit (INDEPENDENT_AMBULATORY_CARE_PROVIDER_SITE_OTHER): Payer: Medicare PPO | Admitting: Psychology

## 2023-05-29 DIAGNOSIS — G4700 Insomnia, unspecified: Secondary | ICD-10-CM | POA: Diagnosis not present

## 2023-05-29 DIAGNOSIS — F411 Generalized anxiety disorder: Secondary | ICD-10-CM

## 2023-05-29 NOTE — Progress Notes (Signed)
 Date: 05/29/2023  Diagnosis G47.00 (Insomnia disorder) [n/a]  300.02 (Generalized anxiety disorder) [n/a]  Symptoms Complains of difficulty remaining asleep. (Status: maintained) -- No Description Entered  Excessive and/or unrealistic worry that is difficult to control occurring more days than not for at least 6 months about a number of events or activities. (Status: maintained) -- No Description Entered  Medication Status compliance  Safety none  If Suicidal or Homicidal State Action Taken: unspecified  Current Risk: low Medications unspecified Objectives Related Problem: Resolve the core conflict that is the source of anxiety. Description: Describe situations, thoughts, feelings, and actions associated with anxieties and worries, their impact on functioning, and attempts to resolve them. Target Date: 2024-02-13 Frequency: Daily Modality: individual Progress: 85%  Related Problem: Resolve the core conflict that is the source of anxiety. Description: Learn and implement calming skills to reduce overall anxiety and manage anxiety symptoms. Target Date: 2024-02-13 Frequency: Daily Modality: individual Progress: 80%  Related Problem: Resolve the core conflict that is the source of anxiety. Description: Learn and implement problem-solving strategies for realistically addressing worries. Target Date: 2024-02-13 Frequency: Daily Modality: individual Progress: 85%  Related Problem: Resolve the core conflict that is the source of anxiety. Description: Maintain involvement in work, family, and social activities. Target Date: 2024-02-13 Frequency: Daily Modality: individual Progress: 85%  Related Problem: Restore restful sleep pattern. Description: Describe the history and details of  sleep pattern. Target Date: 2024-02-13 Frequency: Daily Modality: individual Progress: 90%    Client Response full compliance  Service Location Location, [address] [place], [place] [address]  Service Code cpt [phone] P Self-monitoring  Lifestyle change (exercise, nutrition)  Self care activities  Distress tolerance skill  Identify/label emotions  Identify automatic thoughts  Facilitate problem solving  Normalize/Reframe  Behavioral activation plan  Validate/empathize  Comments  Dx.: Generalized Anxiety (F41.1)  Meds: No psychotropics  Goals: [name] is seeking to develop a more satisfying life-style that takes into account her current physical condition. Goal date 11-04-20. States that the current situation in the world (politics and Covid 19) is making her very anxious. She is "obsessed" with the news (social media) and remains in a constant state of stress. She wants to learn to be less reactive. Will initiate behaviorally oriented individual therapy to address symptoms. Goal date is 10-04-21. While patient has realized some improvement in level of anxiety, she still expresses considerable stress and anxiety. Continues to need better balance in life. Revised date is 12-25. In addition, she has periodic, unexplained sleep issues that compromise her ability to get sufficient sleep. Seeking to resove these problems with improved behavioral strategies. Goal date 12-25. Patient agrees to a Engineering geologist video session and understands the limitations of this platform. She is at home and I am at my home office.   [name] taled about her fears regarding the direction of the country. She cannot help from getting agitated and angry about the political  situation in our country. She talked about her involvement with a service called "[employer]" in which she calls representatives to let them know her opinion. It helps her feel empowered at a time when she and others feel powerless. We talked  about preparing for her sister's return. [name] is very excited to have her sister back at home, as she was feeling isolated and lonely.                                                                                                                                                                                                                                                                                                                                                                                                                                                                                                                                                                                                   `                                                                                                                                         `  Garrel Ridgel, PhD  Time: 3:15p-4:00p 45 minutes

## 2023-06-02 DIAGNOSIS — G4733 Obstructive sleep apnea (adult) (pediatric): Secondary | ICD-10-CM | POA: Diagnosis not present

## 2023-06-05 ENCOUNTER — Ambulatory Visit (INDEPENDENT_AMBULATORY_CARE_PROVIDER_SITE_OTHER): Payer: Medicare PPO | Admitting: Psychology

## 2023-06-05 DIAGNOSIS — F411 Generalized anxiety disorder: Secondary | ICD-10-CM

## 2023-06-05 NOTE — Progress Notes (Signed)
 Date: 06/05/2023  Diagnosis G47.00 (Insomnia disorder) [n/a]  300.02 (Generalized anxiety disorder) [n/a]  Symptoms Complains of difficulty remaining asleep. (Status: maintained) -- No Description Entered  Excessive and/or unrealistic worry that is difficult to control occurring more days than not for at least 6 months about a number of events or activities. (Status: maintained) -- No Description Entered  Medication Status compliance  Safety none  If Suicidal or Homicidal State Action Taken: unspecified  Current Risk: low Medications unspecified Objectives Related Problem: Resolve the core conflict that is the source of anxiety. Description: Describe situations, thoughts, feelings, and actions associated with anxieties and worries, their impact on functioning, and attempts to resolve them. Target Date: 2024-02-13 Frequency: Daily Modality: individual Progress: 85%  Related Problem: Resolve the core conflict that is the source of anxiety. Description: Learn and implement calming skills to reduce overall anxiety and manage anxiety symptoms. Target Date: 2024-02-13 Frequency: Daily Modality: individual Progress: 80%  Related Problem: Resolve the core conflict that is the source of anxiety. Description: Learn and implement problem-solving strategies for realistically addressing worries. Target Date: 2024-02-13 Frequency: Daily Modality: individual Progress: 85%  Related Problem: Resolve the core conflict that is the source of anxiety. Description: Maintain involvement in work, family, and social activities. Target Date: 2024-02-13 Frequency: Daily Modality: individual Progress: 85%  Related Problem: Restore restful sleep pattern. Description: Describe  the history and details of sleep pattern. Target Date: 2024-02-13 Frequency: Daily Modality: individual Progress: 90%    Client Response full compliance  Service Location Location, [address] [place], [place] [address]  Service Code cpt [phone] P Self-monitoring  Lifestyle change (exercise, nutrition)  Self care activities  Distress tolerance skill  Identify/label emotions  Identify automatic thoughts  Facilitate problem solving  Normalize/Reframe  Behavioral activation plan  Validate/empathize  Comments  Dx.: Generalized Anxiety (F41.1)  Meds: No psychotropics  Goals: Lakisa is seeking to develop a more satisfying life-style that takes into account her current physical condition. Goal date 11-04-20. States that the current situation in the world (politics and Covid 19) is making her very anxious. She is "obsessed" with the news (social media) and remains in a constant state of stress. She wants to learn to be less reactive. Will initiate behaviorally oriented individual therapy to address symptoms. Goal date is 10-04-21. While patient has realized some improvement in level of anxiety, she still expresses considerable stress and anxiety. Continues to need better balance in life. Revised date is 12-25. In addition, she has periodic, unexplained sleep issues that compromise her ability to get sufficient sleep. Seeking to resove these problems with improved behavioral strategies. Goal date 12-25. Patient agrees to a Engineering geologist video session and understands the limitations of this platform. She is at home and I am at my home office.   Gurbani says her sister arrived home yesterday.  She is very glad, but says she is tired as she has to do a lot of things for her sister at home. She talked about how life is different now that her sister  is home. Will attempt to go to some social activity. Deshon is feeling more at ease now that her sister is home. Discussed how to preserve her own  physical and emotional energy so that she does not wear out.                                                                                                                                                                                                                                                                                                                                                                                                                                                                                                                                                                                                   `                                                                                                                                         `  Garrel Ridgel, PhD  Time: 3:15p-4:00p 45 minutes

## 2023-06-10 DIAGNOSIS — L089 Local infection of the skin and subcutaneous tissue, unspecified: Secondary | ICD-10-CM | POA: Diagnosis not present

## 2023-06-10 DIAGNOSIS — L02212 Cutaneous abscess of back [any part, except buttock]: Secondary | ICD-10-CM | POA: Diagnosis not present

## 2023-06-11 ENCOUNTER — Ambulatory Visit (INDEPENDENT_AMBULATORY_CARE_PROVIDER_SITE_OTHER): Admitting: Psychology

## 2023-06-11 DIAGNOSIS — G4733 Obstructive sleep apnea (adult) (pediatric): Secondary | ICD-10-CM | POA: Diagnosis not present

## 2023-06-11 DIAGNOSIS — F411 Generalized anxiety disorder: Secondary | ICD-10-CM | POA: Diagnosis not present

## 2023-06-11 DIAGNOSIS — G4700 Insomnia, unspecified: Secondary | ICD-10-CM

## 2023-06-11 NOTE — Progress Notes (Signed)
 Date: 06/11/2023  Diagnosis G47.00 (Insomnia disorder) [n/a]  300.02 (Generalized anxiety disorder) [n/a]  Symptoms Complains of difficulty remaining asleep. (Status: maintained) -- No Description Entered  Excessive and/or unrealistic worry that is difficult to control occurring more days than not for at least 6 months about a number of events or activities. (Status: maintained) -- No Description Entered  Medication Status compliance  Safety none  If Suicidal or Homicidal State Action Taken: unspecified  Current Risk: low Medications unspecified Objectives Related Problem: Resolve the core conflict that is the source of anxiety. Description: Describe situations, thoughts, feelings, and actions associated with anxieties and worries, their impact on functioning, and attempts to resolve them. Target Date: 2024-02-13 Frequency: Daily Modality: individual Progress: 85%  Related Problem: Resolve the core conflict that is the source of anxiety. Description: Learn and implement calming skills to reduce overall anxiety and manage anxiety symptoms. Target Date: 2024-02-13 Frequency: Daily Modality: individual Progress: 80%  Related Problem: Resolve the core conflict that is the source of anxiety. Description: Learn and implement problem-solving strategies for realistically addressing worries. Target Date: 2024-02-13 Frequency: Daily Modality: individual Progress: 85%  Related Problem: Resolve the core conflict that is the source of anxiety. Description: Maintain involvement in work, family, and social activities. Target Date: 2024-02-13 Frequency: Daily Modality: individual Progress: 85%  Related Problem: Restore restful sleep  pattern. Description: Describe the history and details of sleep pattern. Target Date: 2024-02-13 Frequency: Daily Modality: individual Progress: 90%    Client Response full compliance  Service Location Location, [address] [place], [place] [address]  Service Code cpt [phone] P Self-monitoring  Lifestyle change (exercise, nutrition)  Self care activities  Distress tolerance skill  Identify/label emotions  Identify automatic thoughts  Facilitate problem solving  Normalize/Reframe  Behavioral activation plan  Validate/empathize  Comments  Dx.: Generalized Anxiety (F41.1)  Meds: No psychotropics  Goals: Saria is seeking to develop a more satisfying life-style that takes into account her current physical condition. Goal date 11-04-20. States that the current situation in the world (politics and Covid 19) is making her very anxious. She is "obsessed" with the news (social media) and remains in a constant state of stress. She wants to learn to be less reactive. Will initiate behaviorally oriented individual therapy to address symptoms. Goal date is 10-04-21. While patient has realized some improvement in level of anxiety, she still expresses considerable stress and anxiety. Continues to need better balance in life. Revised date is 12-25. In addition, she has periodic, unexplained sleep issues that compromise her ability to get sufficient sleep. Seeking to resove these problems with improved behavioral strategies. Goal date 12-25. Patient agrees to a Engineering geologist video session and understands the limitations of this platform. She is at home and  I am at my home office.   Atonya says that she is having a few very rough days. She had a Covid 19 vaccine and had a bad reaction (as she typically does). Also had a cyst that was infected and had to be drained. She remains very anxious and angry about the political situation. Cannot seem to keep herself from watching news and social mediate in  a moderate way. Talked about her sister's integration back home and the need for both of them to have distractions/activities. She agrees and will seek resources.                                                                                                                                                                                                                                                                                                                                                                                                                                                                                                                                                                                                     `                                                                                                                                         `  Garrel Ridgel, PhD  Time: 11:40a-12:30p 50 minutes

## 2023-06-12 ENCOUNTER — Ambulatory Visit: Admitting: Psychology

## 2023-06-12 ENCOUNTER — Ambulatory Visit: Payer: Medicare PPO | Admitting: Psychology

## 2023-06-19 ENCOUNTER — Ambulatory Visit (INDEPENDENT_AMBULATORY_CARE_PROVIDER_SITE_OTHER): Payer: Medicare PPO | Admitting: Psychology

## 2023-06-19 DIAGNOSIS — F411 Generalized anxiety disorder: Secondary | ICD-10-CM

## 2023-06-19 NOTE — Progress Notes (Signed)
 Date: 06/19/2023  Diagnosis G47.00 (Insomnia disorder) [n/a]  300.02 (Generalized anxiety disorder) [n/a]  Symptoms Complains of difficulty remaining asleep. (Status: maintained) -- No Description Entered  Excessive and/or unrealistic worry that is difficult to control occurring more days than not for at least 6 months about a number of events or activities. (Status: maintained) -- No Description Entered  Medication Status compliance  Safety none  If Suicidal or Homicidal State Action Taken: unspecified  Current Risk: low Medications unspecified Objectives Related Problem: Resolve the core conflict that is the source of anxiety. Description: Describe situations, thoughts, feelings, and actions associated with anxieties and worries, their impact on functioning, and attempts to resolve them. Target Date: 2024-02-13 Frequency: Daily Modality: individual Progress: 85%  Related Problem: Resolve the core conflict that is the source of anxiety. Description: Learn and implement calming skills to reduce overall anxiety and manage anxiety symptoms. Target Date: 2024-02-13 Frequency: Daily Modality: individual Progress: 80%  Related Problem: Resolve the core conflict that is the source of anxiety. Description: Learn and implement problem-solving strategies for realistically addressing worries. Target Date: 2024-02-13 Frequency: Daily Modality: individual Progress: 85%  Related Problem: Resolve the core conflict that is the source of anxiety. Description: Maintain involvement in work, family, and social activities. Target Date: 2024-02-13 Frequency: Daily Modality: individual Progress: 85%  Related  Problem: Restore restful sleep pattern. Description: Describe the history and details of sleep pattern. Target Date: 2024-02-13 Frequency: Daily Modality: individual Progress: 90%    Client Response full compliance  Service Location Location, [address] [place], [place] [address]  Service Code cpt [phone] P Self-monitoring  Lifestyle change (exercise, nutrition)  Self care activities  Distress tolerance skill  Identify/label emotions  Identify automatic thoughts  Facilitate problem solving  Normalize/Reframe  Behavioral activation plan  Validate/empathize  Comments  Dx.: Generalized Anxiety (F41.1)  Meds: No psychotropics  Goals: Raihana is seeking to develop a more satisfying life-style that takes into account her current physical condition. Goal date 11-04-20. States that the current situation in the world (politics and Covid 19) is making her very anxious. She is "obsessed" with the news (social media) and remains in a constant state of stress. She wants to learn to be less reactive. Will initiate behaviorally oriented individual therapy to address symptoms. Goal date is 10-04-21. While patient has realized some improvement in level of anxiety, she still expresses considerable stress and anxiety. Continues to need better balance in life. Revised date is 12-25. In addition, she has periodic, unexplained sleep issues that compromise her ability to get sufficient sleep. Seeking to resove these problems with improved behavioral strategies. Goal date 12-25. Patient agrees to a video  Caregility video session and understands the limitations of this platform. She is at home and I am at my home office.   Victoria talked about her financial security under the current government threats. She is angry and feels powerless. She is striving to develop a plan of how to manage her angst, but is not optimistic she will be able to sustain her distress for the remainder of this administration's time in  office.                                                                                                                                                                                                                                                                                                                                                                                                                                                                                                                                                                                                      `                                                                                                                                         `  Jola Nash, PhD  Time: 3:15p-4:00p 45 minutes

## 2023-06-26 ENCOUNTER — Ambulatory Visit: Payer: Medicare PPO | Admitting: Psychology

## 2023-06-26 DIAGNOSIS — G4700 Insomnia, unspecified: Secondary | ICD-10-CM | POA: Diagnosis not present

## 2023-06-26 DIAGNOSIS — F411 Generalized anxiety disorder: Secondary | ICD-10-CM

## 2023-06-26 NOTE — Progress Notes (Signed)
 Date: 06/26/2023  Diagnosis G47.00 (Insomnia disorder) [n/a]  300.02 (Generalized anxiety disorder) [n/a]  Symptoms Complains of difficulty remaining asleep. (Status: maintained) -- No Description Entered  Excessive and/or unrealistic worry that is difficult to control occurring more days than not for at least 6 months about a number of events or activities. (Status: maintained) -- No Description Entered  Medication Status compliance  Safety none  If Suicidal or Homicidal State Action Taken: unspecified  Current Risk: low Medications unspecified Objectives Related Problem: Resolve the core conflict that is the source of anxiety. Description: Describe situations, thoughts, feelings, and actions associated with anxieties and worries, their impact on functioning, and attempts to resolve them. Target Date: 2024-02-13 Frequency: Daily Modality: individual Progress: 85%  Related Problem: Resolve the core conflict that is the source of anxiety. Description: Learn and implement calming skills to reduce overall anxiety and manage anxiety symptoms. Target Date: 2024-02-13 Frequency: Daily Modality: individual Progress: 80%  Related Problem: Resolve the core conflict that is the source of anxiety. Description: Learn and implement problem-solving strategies for realistically addressing worries. Target Date: 2024-02-13 Frequency: Daily Modality: individual Progress: 85%  Related Problem: Resolve the core conflict that is the source of anxiety. Description: Maintain involvement in work, family, and social activities. Target Date: 2024-02-13 Frequency: Daily Modality:  individual Progress: 85%  Related Problem: Restore restful sleep pattern. Description: Describe the history and details of sleep pattern. Target Date: 2024-02-13 Frequency: Daily Modality: individual Progress: 90%    Client Response full compliance  Service Location Location, [address] [place], [place] [address]  Service Code cpt [phone] P Self-monitoring  Lifestyle change (exercise, nutrition)  Self care activities  Distress tolerance skill  Identify/label emotions  Identify automatic thoughts  Facilitate problem solving  Normalize/Reframe  Behavioral activation plan  Validate/empathize  Comments  Dx.: Generalized Anxiety (F41.1)  Meds: No psychotropics  Goals: Matison is seeking to develop a more satisfying life-style that takes into account her current physical condition. Goal date 11-04-20. States that the current situation in the world (politics and Covid 19) is making her very anxious. She is "obsessed" with the news (social media) and remains in a constant state of stress. She wants to learn to be less reactive. Will initiate behaviorally oriented individual therapy to address symptoms. Goal date is 10-04-21. While patient has realized some improvement in level of anxiety, she still expresses considerable stress and anxiety. Continues to need better balance in life. Revised date is 12-25. In addition, she has periodic, unexplained sleep issues that compromise her ability to get sufficient sleep. Seeking to  resove these problems with improved behavioral strategies. Goal date 12-25. Patient agrees to a Engineering geologist video session and understands the limitations of this platform. She is at home and I am at my home office.   Tyleigh says she had a "serious problem" with her knee this week. She did physical therapy and meds, and is know okay. Says the problem was going up and down stairs. She and sister are adjusting well to her sister's return home. She is feeling good about  their situation, but is focused on her despair about the country's politics and direction. She is feeling very threatened and committed to doing something to help find a solution.                                                                                                                                                                                                                                                                                                                                                                                                                                                                                                                                                                                                          `                                                                                                                                         `  Jola Nash, PhD  Time: 3:15p-4:00p 45 minutes

## 2023-06-29 DIAGNOSIS — L723 Sebaceous cyst: Secondary | ICD-10-CM | POA: Diagnosis not present

## 2023-06-29 DIAGNOSIS — M7631 Iliotibial band syndrome, right leg: Secondary | ICD-10-CM | POA: Diagnosis not present

## 2023-06-29 DIAGNOSIS — M25561 Pain in right knee: Secondary | ICD-10-CM | POA: Diagnosis not present

## 2023-06-29 DIAGNOSIS — I10 Essential (primary) hypertension: Secondary | ICD-10-CM | POA: Diagnosis not present

## 2023-07-03 ENCOUNTER — Ambulatory Visit (INDEPENDENT_AMBULATORY_CARE_PROVIDER_SITE_OTHER): Payer: Medicare PPO | Admitting: Psychology

## 2023-07-03 DIAGNOSIS — F411 Generalized anxiety disorder: Secondary | ICD-10-CM

## 2023-07-03 NOTE — Progress Notes (Signed)
 Date: 07/03/2023  Diagnosis G47.00 (Insomnia disorder) [n/a]  300.02 (Generalized anxiety disorder) [n/a]  Symptoms Complains of difficulty remaining asleep. (Status: maintained) -- No Description Entered  Excessive and/or unrealistic worry that is difficult to control occurring more days than not for at least 6 months about a number of events or activities. (Status: maintained) -- No Description Entered  Medication Status compliance  Safety none  If Suicidal or Homicidal State Action Taken: unspecified  Current Risk: low Medications unspecified Objectives Related Problem: Resolve the core conflict that is the source of anxiety. Description: Describe situations, thoughts, feelings, and actions associated with anxieties and worries, their impact on functioning, and attempts to resolve them. Target Date: 2024-02-13 Frequency: Daily Modality: individual Progress: 85%  Related Problem: Resolve the core conflict that is the source of anxiety. Description: Learn and implement calming skills to reduce overall anxiety and manage anxiety symptoms. Target Date: 2024-02-13 Frequency: Daily Modality: individual Progress: 80%  Related Problem: Resolve the core conflict that is the source of anxiety. Description: Learn and implement problem-solving strategies for realistically addressing worries. Target Date: 2024-02-13 Frequency: Daily Modality: individual Progress: 85%  Related Problem: Resolve the core conflict that is the source of anxiety. Description: Maintain involvement in work, family, and social activities. Target Date:  2024-02-13 Frequency: Daily Modality: individual Progress: 85%  Related Problem: Restore restful sleep pattern. Description: Describe the history and details of sleep pattern. Target Date: 2024-02-13 Frequency: Daily Modality: individual Progress: 90%    Client Response full compliance  Service Location Location, [address] [place], [place] [address]  Service Code cpt [id number] P Self-monitoring  Lifestyle change (exercise, nutrition)  Self care activities  Distress tolerance skill  Identify/label emotions  Identify automatic thoughts  Facilitate problem solving  Normalize/Reframe  Behavioral activation plan  Validate/empathize  Comments  Dx.: Generalized Anxiety (F41.1)  Meds: No psychotropics  Goals: [name] is seeking to develop a more satisfying life-style that takes into account her current physical condition. Goal date 11-04-20. States that the current situation in the world (politics and Covid 19) is making her very anxious. She is "obsessed" with the news (social media) and remains in a constant state of stress. She wants to learn to be less reactive. Will initiate behaviorally oriented individual therapy to address symptoms. Goal date is 10-04-21. While patient has realized some improvement in level of anxiety, she still expresses considerable stress and anxiety. Continues to need better balance in life. Revised date is 12-25. In addition, she  has periodic, unexplained sleep issues that compromise her ability to get sufficient sleep. Seeking to resove these problems with improved behavioral strategies. Goal date 12-25. Patient agrees to a Engineering geologist video session and understands the limitations of this platform. She is at home and I am at my home office.   [name] says she was diagnosed with IT Band issue in knee. They suggested OTC pain killer and some PT exercises. She is gradually adjusting to her sister being home and all the work her sister is having to do to  return to their normal activities. Encouraged at her sister's progress. Moods are less agitated and she is now a little more focused on self care.                                                                                                                                                                                                                                                                                                                                                                                                                                                                                                                                                                                                             `                                                                                                                                         `  Jola Nash, PhD  Time: 3:15p-4:00p 45 minutes

## 2023-07-10 ENCOUNTER — Ambulatory Visit (INDEPENDENT_AMBULATORY_CARE_PROVIDER_SITE_OTHER): Payer: Medicare PPO | Admitting: Psychology

## 2023-07-10 DIAGNOSIS — F411 Generalized anxiety disorder: Secondary | ICD-10-CM

## 2023-07-10 NOTE — Progress Notes (Signed)
 Date: 07/10/2023  Diagnosis G47.00 (Insomnia disorder) [n/a]  300.02 (Generalized anxiety disorder) [n/a]  Symptoms Complains of difficulty remaining asleep. (Status: maintained) -- No Description Entered  Excessive and/or unrealistic worry that is difficult to control occurring more days than not for at least 6 months about a number of events or activities. (Status: maintained) -- No Description Entered  Medication Status compliance  Safety none  If Suicidal or Homicidal State Action Taken: unspecified  Current Risk: low Medications unspecified Objectives Related Problem: Resolve the core conflict that is the source of anxiety. Description: Describe situations, thoughts, feelings, and actions associated with anxieties and worries, their impact on functioning, and attempts to resolve them. Target Date: 2024-02-13 Frequency: Daily Modality: individual Progress: 85%  Related Problem: Resolve the core conflict that is the source of anxiety. Description: Learn and implement calming skills to reduce overall anxiety and manage anxiety symptoms. Target Date: 2024-02-13 Frequency: Daily Modality: individual Progress: 80%  Related Problem: Resolve the core conflict that is the source of anxiety. Description: Learn and implement problem-solving strategies for realistically addressing worries. Target Date: 2024-02-13 Frequency: Daily Modality: individual Progress: 85%  Related Problem: Resolve the core conflict that is the source of anxiety. Description: Maintain involvement in work, family, and social  activities. Target Date: 2024-02-13 Frequency: Daily Modality: individual Progress: 85%  Related Problem: Restore restful sleep pattern. Description: Describe the history and details of sleep pattern. Target Date: 2024-02-13 Frequency: Daily Modality: individual Progress: 90%    Client Response full compliance  Service Location Location, [address] [place], [place] [address]  Service Code cpt [id number] P Self-monitoring  Lifestyle change (exercise, nutrition)  Self care activities  Distress tolerance skill  Identify/label emotions  Identify automatic thoughts  Facilitate problem solving  Normalize/Reframe  Behavioral activation plan  Validate/empathize  Comments  Dx.: Generalized Anxiety (F41.1)  Meds: No psychotropics  Goals: [name] is seeking to develop a more satisfying life-style that takes into account her current physical condition. Goal date 11-04-20. States that the current situation in the world (politics and Covid 19) is making her very anxious. She is "obsessed" with the news (social media) and remains in a constant state of stress. She wants to learn to be less reactive. Will initiate behaviorally oriented individual therapy to address symptoms. Goal date is 10-04-21. While patient has realized some improvement in level of anxiety, she still expresses considerable stress and  anxiety. Continues to need better balance in life. Revised date is 12-25. In addition, she has periodic, unexplained sleep issues that compromise her ability to get sufficient sleep. Seeking to resove these problems with improved behavioral strategies. Goal date 12-25. Patient agrees to a Engineering geologist video session and understands the limitations of this platform. She is at home and I am at my home office.   [name] says she feeling better now that she is resolving her knee pain. She is improving in her reaction to political turmoil. She is trying to place her focus elsewhere. Adjustment to  sister's return home continues to go well and she is gradually doing more activities and getting out more. Her sleep is problematic and we talked about employing better sleep hygiene.                                                                                                                                                                                                                                                                                                                                                                                                                                                                                                                                                                                                               `                                                                                                                                         `  Joy Nash, PhD  Time: 3:15p-4:00p 45 minutes

## 2023-07-11 DIAGNOSIS — G4733 Obstructive sleep apnea (adult) (pediatric): Secondary | ICD-10-CM | POA: Diagnosis not present

## 2023-07-13 DIAGNOSIS — F419 Anxiety disorder, unspecified: Secondary | ICD-10-CM | POA: Diagnosis not present

## 2023-07-13 DIAGNOSIS — I10 Essential (primary) hypertension: Secondary | ICD-10-CM | POA: Diagnosis not present

## 2023-07-13 DIAGNOSIS — E876 Hypokalemia: Secondary | ICD-10-CM | POA: Diagnosis not present

## 2023-07-17 ENCOUNTER — Ambulatory Visit (INDEPENDENT_AMBULATORY_CARE_PROVIDER_SITE_OTHER): Payer: Medicare PPO | Admitting: Psychology

## 2023-07-17 DIAGNOSIS — F411 Generalized anxiety disorder: Secondary | ICD-10-CM

## 2023-07-17 NOTE — Progress Notes (Signed)
 Date: 07/17/2023  Diagnosis G47.00 (Insomnia disorder) [n/a]  300.02 (Generalized anxiety disorder) [n/a]  Symptoms Complains of difficulty remaining asleep. (Status: maintained) -- No Description Entered  Excessive and/or unrealistic worry that is difficult to control occurring more days than not for at least 6 months about a number of events or activities. (Status: maintained) -- No Description Entered  Medication Status compliance  Safety none  If Suicidal or Homicidal State Action Taken: unspecified  Current Risk: low Medications unspecified Objectives Related Problem: Resolve the core conflict that is the source of anxiety. Description: Describe situations, thoughts, feelings, and actions associated with anxieties and worries, their impact on functioning, and attempts to resolve them. Target Date: 2024-02-13 Frequency: Daily Modality: individual Progress: 85%  Related Problem: Resolve the core conflict that is the source of anxiety. Description: Learn and implement calming skills to reduce overall anxiety and manage anxiety symptoms. Target Date: 2024-02-13 Frequency: Daily Modality: individual Progress: 80%  Related Problem: Resolve the core conflict that is the source of anxiety. Description: Learn and implement problem-solving strategies for realistically addressing worries. Target Date: 2024-02-13 Frequency: Daily Modality: individual Progress: 85%  Related Problem: Resolve the core conflict that is the source of anxiety. Description: Maintain involvement in work,  family, and social activities. Target Date: 2024-02-13 Frequency: Daily Modality: individual Progress: 85%  Related Problem: Restore restful sleep pattern. Description: Describe the history and details of sleep pattern. Target Date: 2024-02-13 Frequency: Daily Modality: individual Progress: 90%    Client Response full compliance  Service Location Location, [address] [place], [place] [address]  Service Code cpt [id number] P Self-monitoring  Lifestyle change (exercise, nutrition)  Self care activities  Distress tolerance skill  Identify/label emotions  Identify automatic thoughts  Facilitate problem solving  Normalize/Reframe  Behavioral activation plan  Validate/empathize  Comments  Dx.: Generalized Anxiety (F41.1)  Meds: No psychotropics  Goals: Jimeka is seeking to develop a more satisfying life-style that takes into account her current physical condition. Goal date 11-04-20. States that the current situation in the world (politics and Covid 19) is making her very anxious. She is "obsessed" with the news (social media) and remains in a constant state of stress. She wants to learn to be less reactive. Will initiate behaviorally oriented individual therapy to address symptoms. Goal date is 10-04-21. While  patient has realized some improvement in level of anxiety, she still expresses considerable stress and anxiety. Continues to need better balance in life. Revised date is 12-25. In addition, she has periodic, unexplained sleep issues that compromise her ability to get sufficient sleep. Seeking to resove these problems with improved behavioral strategies. Goal date 12-25. Patient agrees to a Engineering geologist video session and understands the limitations of this platform. She is at home and I am at my home office.   Zondra says she is very stressed about her high blood pressure. She fears that her high blood pressure is out of control and she will end up having a stroke. She has  tried some of the behavioral strategies discussed to lower her pressure, but says it remains high. Has also tried staying off of Facebook and away from [facility], but says she has not noticed any change. Suggested she give more time until she will see results. She is on blood pressure medication. We practiced diaphragmatic breathing techniques. She is not confident with it, but will practice. Says she feels helpless and hopeless to change behavior, but will try "her best".                                                                                                                                                                                                                                                                                                                                                                                                                                                                                                                                                                                                                   `                                                                                                                                         `  Joy Nash, PhD  Time: 3:10p-4:00p 50 minutes

## 2023-07-24 ENCOUNTER — Ambulatory Visit (INDEPENDENT_AMBULATORY_CARE_PROVIDER_SITE_OTHER): Payer: Medicare PPO | Admitting: Psychology

## 2023-07-24 DIAGNOSIS — F411 Generalized anxiety disorder: Secondary | ICD-10-CM

## 2023-07-24 NOTE — Progress Notes (Signed)
 Date: 07/24/2023  Diagnosis G47.00 (Insomnia disorder) [n/a]  300.02 (Generalized anxiety disorder) [n/a]  Symptoms Complains of difficulty remaining asleep. (Status: maintained) -- No Description Entered  Excessive and/or unrealistic worry that is difficult to control occurring more days than not for at least 6 months about a number of events or activities. (Status: maintained) -- No Description Entered  Medication Status compliance  Safety none  If Suicidal or Homicidal State Action Taken: unspecified  Current Risk: low Medications unspecified Objectives Related Problem: Resolve the core conflict that is the source of anxiety. Description: Describe situations, thoughts, feelings, and actions associated with anxieties and worries, their impact on functioning, and attempts to resolve them. Target Date: 2024-02-13 Frequency: Daily Modality: individual Progress: 85%  Related Problem: Resolve the core conflict that is the source of anxiety. Description: Learn and implement calming skills to reduce overall anxiety and manage anxiety symptoms. Target Date: 2024-02-13 Frequency: Daily Modality: individual Progress: 80%  Related Problem: Resolve the core conflict that is the source of anxiety. Description: Learn and implement problem-solving strategies for realistically addressing worries. Target Date: 2024-02-13 Frequency: Daily Modality: individual Progress: 85%  Related Problem: Resolve the core conflict that is the source of anxiety. Description:  Maintain involvement in work, family, and social activities. Target Date: 2024-02-13 Frequency: Daily Modality: individual Progress: 85%  Related Problem: Restore restful sleep pattern. Description: Describe the history and details of sleep pattern. Target Date: 2024-02-13 Frequency: Daily Modality: individual Progress: 90%    Client Response full compliance  Service Location Location, [address] [place], [place] [address]  Service Code cpt [phone] P Self-monitoring  Lifestyle change (exercise, nutrition)  Self care activities  Distress tolerance skill  Identify/label emotions  Identify automatic thoughts  Facilitate problem solving  Normalize/Reframe  Behavioral activation plan  Validate/empathize  Comments  Dx.: Generalized Anxiety (F41.1)  Meds: No psychotropics  Goals: [name] is seeking to develop a more satisfying life-style that takes into account her current physical condition. Goal date 11-04-20. States that the current situation in the world (politics and Covid 19) is making her very anxious. She is "obsessed" with the news (social media) and remains in a constant state of stress. She wants to learn to be less  reactive. Will initiate behaviorally oriented individual therapy to address symptoms. Goal date is 10-04-21. While patient has realized some improvement in level of anxiety, she still expresses considerable stress and anxiety. Continues to need better balance in life. Revised date is 12-25. In addition, she has periodic, unexplained sleep issues that compromise her ability to get sufficient sleep. Seeking to resove these problems with improved behavioral strategies. Goal date 12-25. Patient agrees to a Engineering geologist video session and understands the limitations of this platform. She is at home and I am at my home office.   [name] says she has been unsuccessful to get her blood pressure down. Deep breathing and relaxation worked to help her in the past, but it  no longer has an effect. She feels she is stressed no matter what she does. Talked about other activities may help calm her down. Reading is not an option as she cannot sustain interest and she has lost much of her interest in TV. Gaming is marginally helpful. I also encourage some physical activity/exercising to help mitigate the stress.                                                                                                                                                                                                                                                                                                                                                                                                                                                                                                                                                                                                                     `                                                                                                                                         `  Joy Nash, PhD  Time: 3:10p-4:00p 50 minutes

## 2023-07-31 ENCOUNTER — Ambulatory Visit (INDEPENDENT_AMBULATORY_CARE_PROVIDER_SITE_OTHER): Admitting: Psychology

## 2023-07-31 DIAGNOSIS — G4700 Insomnia, unspecified: Secondary | ICD-10-CM

## 2023-07-31 DIAGNOSIS — F411 Generalized anxiety disorder: Secondary | ICD-10-CM | POA: Diagnosis not present

## 2023-07-31 NOTE — Progress Notes (Signed)
 Date: 07/31/2023  Diagnosis G47.00 (Insomnia disorder) [n/a]  300.02 (Generalized anxiety disorder) [n/a]  Symptoms Complains of difficulty remaining asleep. (Status: maintained) -- No Description Entered  Excessive and/or unrealistic worry that is difficult to control occurring more days than not for at least 6 months about a number of events or activities. (Status: maintained) -- No Description Entered  Medication Status compliance  Safety none  If Suicidal or Homicidal State Action Taken: unspecified  Current Risk: low Medications unspecified Objectives Related Problem: Resolve the core conflict that is the source of anxiety. Description: Describe situations, thoughts, feelings, and actions associated with anxieties and worries, their impact on functioning, and attempts to resolve them. Target Date: 2024-02-13 Frequency: Daily Modality: individual Progress: 85%  Related Problem: Resolve the core conflict that is the source of anxiety. Description: Learn and implement calming skills to reduce overall anxiety and manage anxiety symptoms. Target Date: 2024-02-13 Frequency: Daily Modality: individual Progress: 80%  Related Problem: Resolve the core conflict that is the source of anxiety. Description: Learn and implement problem-solving strategies for realistically addressing worries. Target Date: 2024-02-13 Frequency: Daily Modality: individual Progress: 85%  Related Problem: Resolve the core conflict that is the source  of anxiety. Description: Maintain involvement in work, family, and social activities. Target Date: 2024-02-13 Frequency: Daily Modality: individual Progress: 85%  Related Problem: Restore restful sleep pattern. Description: Describe the history and details of sleep pattern. Target Date: 2024-02-13 Frequency: Daily Modality: individual Progress: 90%    Client Response full compliance  Service Location Location, [address] [place], [place] [address]  Service Code cpt [phone] P Self-monitoring  Lifestyle change (exercise, nutrition)  Self care activities  Distress tolerance skill  Identify/label emotions  Identify automatic thoughts  Facilitate problem solving  Normalize/Reframe  Behavioral activation plan  Validate/empathize  Comments  Dx.: Generalized Anxiety (F41.1)  Meds: No psychotropics  Goals: Winry is seeking to develop a more satisfying life-style that takes into account her current physical condition. Goal date 11-04-20. States that the current situation in the world (politics and Covid 19) is making her very anxious. She is "obsessed" with the news (social media)  and remains in a constant state of stress. She wants to learn to be less reactive. Will initiate behaviorally oriented individual therapy to address symptoms. Goal date is 10-04-21. While patient has realized some improvement in level of anxiety, she still expresses considerable stress and anxiety. Continues to need better balance in life. Revised date is 12-25. In addition, she has periodic, unexplained sleep issues that compromise her ability to get sufficient sleep. Seeking to resove these problems with improved behavioral strategies. Goal date 12-25. Patient agrees to a Engineering geologist video session and understands the limitations of this platform. She is at home and I am at my home office.   Amandalynn says that she has had a couple of good night's sleep. She is working to break the habit of looking at her  ipad in the middle of the night. This has made it easier to fall back asleep. She talked about attempts to keep herself less anxious. She has a number of medical fears and discussed how to manage those fears and remain safe.                                                                                                                                                                                                                                                                                                                                                                                                                                                                                                                                                                                                                        `                                                                                                                                         `  Jola Nash, PhD  Time: 3:10p-4:00p 50 minutes

## 2023-08-07 ENCOUNTER — Ambulatory Visit (INDEPENDENT_AMBULATORY_CARE_PROVIDER_SITE_OTHER): Admitting: Psychology

## 2023-08-07 DIAGNOSIS — F411 Generalized anxiety disorder: Secondary | ICD-10-CM | POA: Diagnosis not present

## 2023-08-07 DIAGNOSIS — G4700 Insomnia, unspecified: Secondary | ICD-10-CM | POA: Diagnosis not present

## 2023-08-07 NOTE — Progress Notes (Signed)
 Date: 08/07/2023  Diagnosis G47.00 (Insomnia disorder) [n/a]  300.02 (Generalized anxiety disorder) [n/a]  Symptoms Complains of difficulty remaining asleep. (Status: maintained) -- No Description Entered  Excessive and/or unrealistic worry that is difficult to control occurring more days than not for at least 6 months about a number of events or activities. (Status: maintained) -- No Description Entered  Medication Status compliance  Safety none  If Suicidal or Homicidal State Action Taken: unspecified  Current Risk: low Medications unspecified Objectives Related Problem: Resolve the core conflict that is the source of anxiety. Description: Describe situations, thoughts, feelings, and actions associated with anxieties and worries, their impact on functioning, and attempts to resolve them. Target Date: 2024-02-13 Frequency: Daily Modality: individual Progress: 85%  Related Problem: Resolve the core conflict that is the source of anxiety. Description: Learn and implement calming skills to reduce overall anxiety and manage anxiety symptoms. Target Date: 2024-02-13 Frequency: Daily Modality: individual Progress: 80%  Related Problem: Resolve the core conflict that is the source of anxiety. Description: Learn and implement problem-solving strategies for realistically addressing worries. Target Date: 2024-02-13 Frequency: Daily Modality: individual Progress: 85%  Related Problem: Resolve the core  conflict that is the source of anxiety. Description: Maintain involvement in work, family, and social activities. Target Date: 2024-02-13 Frequency: Daily Modality: individual Progress: 85%  Related Problem: Restore restful sleep pattern. Description: Describe the history and details of sleep pattern. Target Date: 2024-02-13 Frequency: Daily Modality: individual Progress: 90%    Client Response full compliance  Service Location Location, [address] [place], [place] [address]  Service Code cpt [phone] Self-monitoring  Lifestyle change (exercise, nutrition)  Self care activities  Distress tolerance skill  Identify/label emotions  Identify automatic thoughts  Facilitate problem solving  Normalize/Reframe  Behavioral activation plan  Validate/empathize  Comments  Dx.: Generalized Anxiety (F41.1)  Meds: No psychotropics  Goals: [name] is seeking to develop a more satisfying life-style that takes into account her current physical condition. Goal date 11-04-20. States that the current situation in the world Production designer, theatre/television/film and  Covid 19) is making her very anxious. She is "obsessed" with the news (social media) and remains in a constant state of stress. She wants to learn to be less reactive. Will initiate behaviorally oriented individual therapy to address symptoms. Goal date is 10-04-21. While patient has realized some improvement in level of anxiety, she still expresses considerable stress and anxiety. Continues to need better balance in life. Revised date is 12-25. In addition, she has periodic, unexplained sleep issues that compromise her ability to get sufficient sleep. Seeking to resove these problems with improved behavioral strategies. Goal date 12-25. Patient agrees to a Engineering geologist video session and understands the limitations of this platform. She is at home and I am at my home office.   Hang reports that she is looking less at social media and news in order to reduce  her agitation. It is helping in some ways. She is sleeping a little better and she is less focused on negativity in her life. She is doing more self-care by following the exercises prescribed by a physical therapist.                                                                                                                                                                                                                                                                                                                                                                                                                                                                                                                                                                                                                           `                                                                                                                                         `  Joy Nash, PhD  Time: 3:10p-4:00p 50 minutes

## 2023-08-10 ENCOUNTER — Encounter: Payer: Self-pay | Admitting: Internal Medicine

## 2023-08-10 ENCOUNTER — Ambulatory Visit: Payer: Medicare PPO | Admitting: Internal Medicine

## 2023-08-10 VITALS — BP 130/80 | HR 54 | Ht 60.25 in | Wt 180.2 lb

## 2023-08-10 DIAGNOSIS — E039 Hypothyroidism, unspecified: Secondary | ICD-10-CM

## 2023-08-10 LAB — T4, FREE: Free T4: 1.7 ng/dL (ref 0.8–1.8)

## 2023-08-10 LAB — T3, FREE: T3, Free: 3.7 pg/mL (ref 2.3–4.2)

## 2023-08-10 LAB — TSH: TSH: 0.69 m[IU]/L (ref 0.40–4.50)

## 2023-08-10 NOTE — Patient Instructions (Signed)
Please continue  Levothyroxine 112 mcg and Liothyronine 5 mcg daily. ? ?Take the thyroid hormone every day, with water, at least 30 minutes before breakfast, separated by at least 4 hours from: ?- acid reflux medications ?- calcium ?- iron ?- multivitamins ? ?Please stop at the lab. ? ?Please return in 1 year. ? ?

## 2023-08-10 NOTE — Progress Notes (Addendum)
 Patient ID: [name], female   DOB: 1945-03-09, 78 y.o.   MRN: [id number]  HPI  [name] is a 78 y.o.-year-old female, presenting for f/u for acquired hypothyroidism. Last visit 1 year ago.  Interim hx: She continues to have fatigue, imbalance -previously in physical therapy, no palpitations Before last visit she was admitted with hypertensive crisis 07/21/2022.  At that time, blood pressure was 263/94.  An MRI did not show a stroke but several meningiomas.  She continued to have high blood pressure instances since last visit, per review of the chart, the highest being in 01/2023: Systolic BP 256. Hydralazine  has been increased to now 4x a day.  Reviewed hx: She has been diagnosed with hypothyroidism "many years" ago.  She continues on both liothyronine  and levothyroxine .  She takes LT4 112 mcg and LT3 5 mcg daily (equivalent of 132 mcg of LT4): - in am - fasting - at least 1 hour from b'fast - no Fe, MVI, PPIs - no Calcium   - not on Biotin On vit C and D at night.  Reviewed latest TFTs: Lab Results  Component Value Date   TSH 1.19 07/27/2022   TSH 1.12 07/15/2021   TSH 1.210 07/16/2020   TSH 1.880 06/23/2019   TSH 0.963 03/28/2018   TSH 1.240 08/14/2017   TSH 1.230 09/01/2016   TSH 1.75 11/13/2015   TSH 1.34 04/14/2015   TSH 3.549 02/12/2015   FREET4 1.39 07/27/2022   FREET4 1.16 07/15/2021   FREET4 1.71 07/16/2020   FREET4 1.46 06/23/2019   FREET4 1.67 03/28/2018   FREET4 1.82 (H) 08/14/2017   FREET4 1.75 09/01/2016   FREET4 1.6 11/13/2015   FREET4 1.37 04/14/2015   FREET4 1.71 04/30/2013   She complains of poor memory when the TSH is close to the upper limit of normal.  She started to feel much better after the last increase in dose in 02/2015) remains normal after.   She continues to complain of insomnia, which is chronic for her.  Pt denies: - feeling nodules in neck - hoarseness - dysphagia - choking  She has no FH of thyroid  disorders. No FH of  thyroid  cancer. No h/o radiation tx to head or neck. No herbal supplements. No Biotin use. No recent steroids use.   She has anxiety >> seeing a therapist. She uses a CPAP machine >> cough in am>> now not using the water tank >> cough resolved. She has osteopenia.  ROS: + See HPI  I reviewed pt's medications, allergies, PMH, social hx, family hx, and changes were documented in the history of present illness. Otherwise, unchanged from my initial visit note.  Past Medical History:  Diagnosis Date   Abnormal perimenopausal bleeding 03/07/2003   Allergy    Anemia    Anxiety    Arthritis    Dysuria 03/06/2002   Endometrial polyp 03/06/2004   Environmental allergies    Fibroid 02/19/2003   H/O varicella    History of measles, mumps, or rubella    Hx of colonic polyps 12/15/2010   Hypertension 07/19/2010   echo for pre-op EF 55%  normal left wall thickness LA mildly dialated with mitral and tricuspid regurgatation   Hypothyroidism    Interstitial cystitis    Menopausal symptoms 03/06/2002   Obesity    Osteoarthritis    of knees   Osteopenia 02/04/2012   -1.8 T score right femur neck   PMB (postmenopausal bleeding) 06/21/2010   Sleep apnea    Urinary incontinence 03/06/2009  Vitamin D  deficiency    history of   Past Surgical History:  Procedure Laterality Date   COLONOSCOPY  12/15/10   HYSTEROSCOPY  2006 and May 2012   for fibroids, endometrial polyps   MOUTH SURGERY     MYOMECTOMY  1974   Social History   Social History   Marital Status: Divorced    Spouse Name: N/A   Number of Children: 0   Occupational History   Retired, 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 Education: [employer]. Exercise: Yoga 2 times a week.    Current Outpatient Medications on File Prior to Visit  Medication Sig Dispense Refill   Ascorbic Acid (VITAMIN C) 1000 MG tablet Take 1,000 mg by mouth daily. One tablet a day     benazepril -hydrochlorthiazide (LOTENSIN  HCT) 20-12.5 MG tablet Take 2 tablets by mouth daily. 180 tablet 1   cholecalciferol (VITAMIN D3) 25 MCG (1000 UNIT) tablet Take 1,000 Units by mouth daily.     hydrALAZINE  (APRESOLINE ) 50 MG tablet Take 1 tablet (50 mg total) by mouth 3 (three) times daily with meals. 90 tablet 3   levothyroxine  (SYNTHROID ) 112 MCG tablet Take 1 tablet (112 mcg total) by mouth daily before breakfast. 90 tablet 3   liothyronine  (CYTOMEL ) 5 MCG tablet Take 1 tablet by mouth before breakfast 90 tablet 3   metoprolol  succinate (TOPROL -XL) 25 MG 24 hr tablet TAKE 1 TABLET(25 MG) BY MOUTH DAILY 90 tablet 3   No current facility-administered medications on file prior to visit.   Allergies  Allergen Reactions   Bactrim [Sulfamethoxazole-Trimethoprim]    Epinephrine     shakey   Erythromycin    Sulfa Antibiotics Rash   Family History  Problem Relation Age of Onset   Kidney disease Father        from uremia   Benign prostatic hyperplasia Father    Hypertension Mother    Colon cancer Paternal Uncle    Clotting disorder Maternal Grandmother        ????   PE: BP 130/80   Pulse (!) 54   Ht 5' 0.25" (1.53 m)   Wt 180 lb 3.2 oz (81.7 kg)   SpO2 96%   BMI 34.90 kg/m  Wt Readings from Last 3 Encounters:  08/10/23 180 lb 3.2 oz (81.7 kg)  10/26/22 179 lb (81.2 kg)  07/27/22 180 lb 12.8 oz (82 kg)   Constitutional: overweight, in NAD Eyes:  EOMI, no exophthalmos ENT: no neck masses, no cervical lymphadenopathy Cardiovascular: RRR, No RG, + 1/6 SEM Respiratory: CTA B Musculoskeletal: no deformities Skin:no rashes Neurological: no tremor with outstretched hands  ASSESSMENT: 1. Hypothyroidism  PLAN:  1. Patient with longstanding hypothyroidism, on combination of levothyroxine  and  liothyronine .  She mentions memory loss when the TSH is in the upper range of the normal interval. - latest thyroid  labs reviewed with pt. >> normal: Lab Results  Component Value Date   TSH 1.19 07/27/2022  - she continues on LT4 112 mcg + LT3 5 mcg daily (equivalent to 132 mcg LT4 daily) - pt feels good on this dose except for disequilibrium especially when changing position and turning.  - we discussed about taking  the thyroid  hormone every day, with water, >30 minutes before breakfast, separated by >4 hours from acid reflux medications, calcium , iron, multivitamins. Pt. is taking it correctly. - will check thyroid  tests today: TSH, fT3, and fT4 - If labs are abnormal, she will need to return for repeat TFTs in 1.5 months - OTW, I will see her back in 1 year  Needs refills.  Office Visit on 08/10/2023  Component Date Value Ref Range Status   TSH 08/10/2023 0.69  0.40 - 4.50 mIU/L Final   Free T4 08/10/2023 1.7  0.8 - 1.8 ng/dL Final   T3, Free 16/12/9602 3.7  2.3 - 4.2 pg/mL Final  Normal.  Requested Prescriptions   Signed Prescriptions Disp Refills   levothyroxine  (SYNTHROID ) 112 MCG tablet 90 tablet 3    Sig: Take 1 tablet (112 mcg total) by mouth daily before breakfast.   liothyronine  (CYTOMEL ) 5 MCG tablet 90 tablet 3    Sig: Take 1 tablet by mouth before breakfast    Emilie Harden, MD PhD [facility] Endocrinology

## 2023-08-11 DIAGNOSIS — G4733 Obstructive sleep apnea (adult) (pediatric): Secondary | ICD-10-CM | POA: Diagnosis not present

## 2023-08-13 ENCOUNTER — Ambulatory Visit: Payer: Self-pay | Admitting: Internal Medicine

## 2023-08-13 MED ORDER — LEVOTHYROXINE SODIUM 112 MCG PO TABS: 112.0000 ug | ORAL_TABLET | Freq: Every day | ORAL | 3 refills | Status: AC

## 2023-08-13 MED ORDER — LIOTHYRONINE SODIUM 5 MCG PO TABS: ORAL_TABLET | ORAL | 3 refills | Status: AC

## 2023-08-13 NOTE — Addendum Note (Signed)
 Addended by: Emilie Harden on: 08/13/2023 07:38 AM   Modules accepted: Orders

## 2023-08-14 ENCOUNTER — Ambulatory Visit (INDEPENDENT_AMBULATORY_CARE_PROVIDER_SITE_OTHER): Admitting: Psychology

## 2023-08-14 DIAGNOSIS — G4700 Insomnia, unspecified: Secondary | ICD-10-CM | POA: Diagnosis not present

## 2023-08-14 DIAGNOSIS — F411 Generalized anxiety disorder: Secondary | ICD-10-CM

## 2023-08-14 NOTE — Progress Notes (Signed)
 Date: 08/14/2023  Diagnosis G47.00 (Insomnia disorder) [n/a]  300.02 (Generalized anxiety disorder) [n/a]  Symptoms Complains of difficulty remaining asleep. (Status: maintained) -- No Description Entered  Excessive and/or unrealistic worry that is difficult to control occurring more days than not for at least 6 months about a number of events or activities. (Status: maintained) -- No Description Entered  Medication Status compliance  Safety none  If Suicidal or Homicidal State Action Taken: unspecified  Current Risk: low Medications unspecified Objectives Related Problem: Resolve the core conflict that is the source of anxiety. Description: Describe situations, thoughts, feelings, and actions associated with anxieties and worries, their impact on functioning, and attempts to resolve them. Target Date: 2024-02-13 Frequency: Daily Modality: individual Progress: 85%  Related Problem: Resolve the core conflict that is the source of anxiety. Description: Learn and implement calming skills to reduce overall anxiety and manage anxiety symptoms. Target Date: 2024-02-13 Frequency: Daily Modality: individual Progress: 80%  Related Problem: Resolve the core conflict that is the source of anxiety. Description: Learn and implement problem-solving strategies for realistically addressing worries. Target Date: 2024-02-13 Frequency: Daily Modality: individual Progress:  85%  Related Problem: Resolve the core conflict that is the source of anxiety. Description: Maintain involvement in work, family, and social activities. Target Date: 2024-02-13 Frequency: Daily Modality: individual Progress: 85%  Related Problem: Restore restful sleep pattern. Description: Describe the history and details of sleep pattern. Target Date: 2024-02-13 Frequency: Daily Modality: individual Progress: 90%    Client Response full compliance  Service Location Location, [address] [place], [place] [address]  Service Code cpt [phone] P Self-monitoring  Lifestyle change (exercise, nutrition)  Self care activities  Distress tolerance skill  Identify/label emotions  Identify automatic thoughts  Facilitate problem solving  Normalize/Reframe  Behavioral activation plan  Validate/empathize  Comments  Dx.: Generalized Anxiety (F41.1)  Meds: No psychotropics  Goals: [name] is seeking to develop a more satisfying life-style that takes into account her current  physical condition. Goal date 11-04-20. States that the current situation in the world (politics and Covid 19) is making her very anxious. She is "obsessed" with the news (social media) and remains in a constant state of stress. She wants to learn to be less reactive. Will initiate behaviorally oriented individual therapy to address symptoms. Goal date is 10-04-21. While patient has realized some improvement in level of anxiety, she still expresses considerable stress and anxiety. Continues to need better balance in life. Revised date is 12-25. In addition, she has periodic, unexplained sleep issues that compromise her ability to get sufficient sleep. Seeking to resove these problems with improved behavioral strategies. Goal date 12-25. Patient agrees to a Engineering geologist video session and understands the limitations of this platform. She is at home and I am at my home office.   [name] says that she has been tired lately.  Claims sleep is still disturbed. She talked about her ongoing political concerns and inability to turn away from TV news. Recognizes she needs a break, but claims she cannot stop. Feels she has no choice other than stay up to date on news. She does not have enough activity to keep her busy/occupied. Biggest passion these days is for [name] to go out to a restaurant for a meal.                                                                                                                                                                                                                                                                                                                                                                                                                                                                                                                                                                                                                          `                                                                                                                                         `  Jola Nash, PhD  Time: 3:15p-4:00p 45 minutes

## 2023-08-21 ENCOUNTER — Ambulatory Visit (INDEPENDENT_AMBULATORY_CARE_PROVIDER_SITE_OTHER): Admitting: Psychology

## 2023-08-21 DIAGNOSIS — F411 Generalized anxiety disorder: Secondary | ICD-10-CM | POA: Diagnosis not present

## 2023-08-21 DIAGNOSIS — G4700 Insomnia, unspecified: Secondary | ICD-10-CM | POA: Diagnosis not present

## 2023-08-21 NOTE — Progress Notes (Signed)
 Date: 08/21/2023  Diagnosis G47.00 (Insomnia disorder) [n/a]  300.02 (Generalized anxiety disorder) [n/a]  Symptoms Complains of difficulty remaining asleep. (Status: maintained) -- No Description Entered  Excessive and/or unrealistic worry that is difficult to control occurring more days than not for at least 6 months about a number of events or activities. (Status: maintained) -- No Description Entered  Medication Status compliance  Safety none  If Suicidal or Homicidal State Action Taken: unspecified  Current Risk: low Medications unspecified Objectives Related Problem: Resolve the core conflict that is the source of anxiety. Description: Describe situations, thoughts, feelings, and actions associated with anxieties and worries, their impact on functioning, and attempts to resolve them. Target Date: 2024-02-13 Frequency: Daily Modality: individual Progress: 85%  Related Problem: Resolve the core conflict that is the source of anxiety. Description: Learn and implement calming skills to reduce overall anxiety and manage anxiety symptoms. Target Date: 2024-02-13 Frequency: Daily Modality: individual Progress: 80%  Related Problem: Resolve the core conflict that is the source of anxiety. Description: Learn and implement problem-solving strategies for realistically addressing worries. Target Date: 2024-02-13 Frequency: Daily Modality:  individual Progress: 85%  Related Problem: Resolve the core conflict that is the source of anxiety. Description: Maintain involvement in work, family, and social activities. Target Date: 2024-02-13 Frequency: Daily Modality: individual Progress: 85%  Related Problem: Restore restful sleep pattern. Description: Describe the history and details of sleep pattern. Target Date: 2024-02-13 Frequency: Daily Modality: individual Progress: 90%    Client Response full compliance  Service Location Location, [address] [place], [place] [address]  Service Code cpt [phone] P Self-monitoring  Lifestyle change (exercise, nutrition)  Self care activities  Distress tolerance skill  Identify/label emotions  Identify automatic thoughts  Facilitate problem solving  Normalize/Reframe  Behavioral activation plan  Validate/empathize  Comments  Dx.: Generalized Anxiety (F41.1)  Meds: No psychotropics  Goals:  [name] is seeking to develop a more satisfying life-style that takes into account her current physical condition. Goal date 11-04-20. States that the current situation in the world (politics and Covid 19) is making her very anxious. She is obsessed with the news (social media) and remains in a constant state of stress. She wants to learn to be less reactive. Will initiate behaviorally oriented individual therapy to address symptoms. Goal date is 10-04-21. While patient has realized some improvement in level of anxiety, she still expresses considerable stress and anxiety. Continues to need better balance in life. Revised date is 12-25. In addition, she has periodic, unexplained sleep issues that compromise her ability to get sufficient sleep. Seeking to resove these problems with improved behavioral strategies. Goal date 12-25. Patient agrees to a Engineering geologist video session and understands the limitations of this platform. She is at home and I am at my home office.   [name] says she had a  very nice birthday. Her former sister in law had her over for a graduation party and also a birthday celebration. She says she is not sleeping well because there is too much going on in the world. Feels she is addicted to the news. We talked about ways she can start to pull back from media consumption. She is skeptical, but will try. She has considerable fears about the future of the country and how this impact her in the near future.                                                                                                                                                                                                                                                                                                                                                                                                                                                                                                                                                                                                                              `                                                                                                                                         `  Jola Nash, PhD  Time: 3:15p-4:00p 45 minutes

## 2023-08-28 ENCOUNTER — Ambulatory Visit (INDEPENDENT_AMBULATORY_CARE_PROVIDER_SITE_OTHER): Admitting: Psychology

## 2023-08-28 DIAGNOSIS — F411 Generalized anxiety disorder: Secondary | ICD-10-CM

## 2023-08-28 DIAGNOSIS — G4700 Insomnia, unspecified: Secondary | ICD-10-CM | POA: Diagnosis not present

## 2023-08-28 NOTE — Progress Notes (Signed)
 Date: 08/28/2023  Diagnosis G47.00 (Insomnia disorder) [n/a]  300.02 (Generalized anxiety disorder) [n/a]  Symptoms Complains of difficulty remaining asleep. (Status: maintained) -- No Description Entered  Excessive and/or unrealistic worry that is difficult to control occurring more days than not for at least 6 months about a number of events or activities. (Status: maintained) -- No Description Entered  Medication Status compliance  Safety none  If Suicidal or Homicidal State Action Taken: unspecified  Current Risk: low Medications unspecified Objectives Related Problem: Resolve the core conflict that is the source of anxiety. Description: Describe situations, thoughts, feelings, and actions associated with anxieties and worries, their impact on functioning, and attempts to resolve them. Target Date: 2024-02-13 Frequency: Daily Modality: individual Progress: 85%  Related Problem: Resolve the core conflict that is the source of anxiety. Description: Learn and implement calming skills to reduce overall anxiety and manage anxiety symptoms. Target Date: 2024-02-13 Frequency: Daily Modality: individual Progress: 80%  Related Problem: Resolve the core conflict that is the source of anxiety. Description: Learn and implement problem-solving strategies for realistically addressing worries. Target Date:  2024-02-13 Frequency: Daily Modality: individual Progress: 85%  Related Problem: Resolve the core conflict that is the source of anxiety. Description: Maintain involvement in work, family, and social activities. Target Date: 2024-02-13 Frequency: Daily Modality: individual Progress: 85%  Related Problem: Restore restful sleep pattern. Description: Describe the history and details of sleep pattern. Target Date: 2024-02-13 Frequency: Daily Modality: individual Progress: 90%    Client Response full compliance  Service Location Location, [address] [place], [place] [address]  Service Code cpt [id number] P Self-monitoring  Lifestyle change (exercise, nutrition)  Self care activities  Distress tolerance skill  Identify/label emotions  Identify automatic thoughts  Facilitate problem solving  Normalize/Reframe  Behavioral activation plan  Validate/empathize  Comments  Dx.: Generalized Anxiety (F41.1)  Meds: No psychotropics  Goals: [name] is seeking to develop a more satisfying life-style that takes into account her current physical condition. Goal date 11-04-20. States that the current situation in the world (politics and Covid 19) is making her very anxious. She is obsessed with the news (social media) and remains in a constant state of stress. She wants to learn to be less reactive. Will initiate behaviorally oriented individual therapy to address symptoms. Goal date is 10-04-21. While patient has realized some improvement in level of anxiety, she still expresses considerable stress and anxiety. Continues to need better balance in life. Revised date is 12-25. In addition, she has periodic, unexplained sleep issues that compromise her ability to get sufficient sleep. Seeking to resove these problems with improved behavioral strategies. Goal date 12-25. Patient agrees to a Engineering geologist video session and understands the limitations of this platform. She is at home and I am at my  home office.   [name] says she went to a seminar on retirement homes and she is thinking it might be time to consider a retirement home. Her sister is less convinced, but [name] is considering a deposit to be on a wait list, which is up to 5 years. I encouraged her to move forward. She feels less anxious when she has a plan moving forward. Not likely to act on anything without her sister.                                                                                                                                                                                                                                                                                                                                                                                                                                                                                                                                                                                                                                `                                                                                                                                         `  CONI ALM KERNS, PhD  Time: 3:15p-4:00p 45 minutes

## 2023-09-01 ENCOUNTER — Encounter: Payer: Self-pay | Admitting: Neurology

## 2023-09-01 DIAGNOSIS — G4733 Obstructive sleep apnea (adult) (pediatric): Secondary | ICD-10-CM

## 2023-09-03 NOTE — Telephone Encounter (Signed)
 SABRA

## 2023-09-04 ENCOUNTER — Ambulatory Visit (INDEPENDENT_AMBULATORY_CARE_PROVIDER_SITE_OTHER): Admitting: Psychology

## 2023-09-04 DIAGNOSIS — F411 Generalized anxiety disorder: Secondary | ICD-10-CM | POA: Diagnosis not present

## 2023-09-04 DIAGNOSIS — G4700 Insomnia, unspecified: Secondary | ICD-10-CM
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
## 2023-09-10 NOTE — Telephone Encounter (Signed)
 Pressure changed to  9 cm H20 in [facility]. Order written and sent to DME.

## 2023-09-11 ENCOUNTER — Ambulatory Visit (INDEPENDENT_AMBULATORY_CARE_PROVIDER_SITE_OTHER): Admitting: Psychology

## 2023-09-11 DIAGNOSIS — F411 Generalized anxiety disorder: Secondary | ICD-10-CM

## 2023-09-11 DIAGNOSIS — G4733 Obstructive sleep apnea (adult) (pediatric): Secondary | ICD-10-CM | POA: Diagnosis not present

## 2023-09-11 DIAGNOSIS — G4700 Insomnia, unspecified: Secondary | ICD-10-CM

## 2023-09-11 NOTE — Progress Notes (Signed)
 Date: 09/11/2023  Diagnosis G47.00 (Insomnia disorder) [n/a]  300.02 (Generalized anxiety disorder) [n/a]  Symptoms Complains of difficulty remaining asleep. (Status: maintained) -- No Description Entered  Excessive and/or unrealistic worry that is difficult to control occurring more days than not for at least 6 months about a number of events or activities. (Status: maintained) -- No Description Entered  Medication Status compliance  Safety none  If Suicidal or Homicidal State Action Taken: unspecified  Current Risk: low Medications unspecified Objectives Related Problem: Resolve the core conflict that is the source of anxiety. Description: Describe situations, thoughts, feelings, and actions associated with anxieties and worries, their impact on functioning, and attempts to resolve them. Target Date: 2024-02-13 Frequency: Daily Modality: individual Progress: 85%  Related Problem: Resolve the core conflict that is the source of anxiety. Description: Learn and implement calming skills to reduce overall anxiety and manage anxiety symptoms. Target Date: 2024-02-13 Frequency: Daily Modality: individual Progress: 80%  Related Problem: Resolve the core conflict that is the source of anxiety. Description: Learn and implement problem-solving strategies for realistically addressing worries. Target Date: 2024-02-13 Frequency: Daily Modality: individual Progress: 85%  Related Problem: Resolve the core conflict that is the source of anxiety. Description: Maintain involvement in work, family, and social activities. Target Date: 2024-02-13 Frequency: Daily Modality: individual Progress: 85%  Related Problem: Restore restful sleep pattern. Description: Describe the history and details of sleep pattern. Target Date: 2024-02-13 Frequency: Daily Modality:  individual Progress: 90%    Client Response full compliance  Service Location Location, [address] [place], [place] [address]  Service Code cpt [phone] P Self-monitoring  Lifestyle change (exercise, nutrition)  Self care activities  Distress tolerance skill  Identify/label emotions  Identify automatic thoughts  Facilitate problem solving  Normalize/Reframe  Behavioral activation plan  Validate/empathize  Comments  Dx.: Generalized Anxiety (F41.1)  Meds: No psychotropics  Goals: Teola is seeking to develop a more satisfying life-style that takes into account her current physical condition. Goal date 11-04-20. States that the current situation in the world (politics and Covid 19) is making her very anxious. She is obsessed with the news (social media) and remains in a constant state of stress. She wants to learn to be less reactive. Will initiate behaviorally oriented individual therapy to address symptoms. Goal date is 10-04-21. While patient has realized some improvement in level of anxiety, she still expresses considerable stress and anxiety. Continues to need better balance in life. Revised date is 12-25. In addition, she has periodic, unexplained sleep issues that compromise her ability to get sufficient sleep. Seeking to resove these problems with improved behavioral strategies. Goal date 12-25. Patient agrees to a Engineering geologist video session and understands the limitations of this platform. She is at home and I am at my home office.   Elise says that her sleep has been disruptive lately. Not sure why and says it is more difficult getting back to sleep. She talked about her efforts to relax and not get too overwhelmed with all of her political fears. She is making some social plan that will take her out of the house,  which is very positive. Talked about good sleep hygiene and exercises to help improve sleep.                                                                                                                                                                                                                                                                                                                                                                                                                                                                                                                                                                                                                                        `                                                                                                                                         `  CONI ALM KERNS, PhD  Time: 3:15p-4:00p 45 minutes

## 2023-09-11 NOTE — Telephone Encounter (Signed)
 [place], [place]  [place], Heather CROME, RN; [place], Terri; Riddle, Melissa L Printed thanks.

## 2023-09-18 ENCOUNTER — Ambulatory Visit (INDEPENDENT_AMBULATORY_CARE_PROVIDER_SITE_OTHER): Admitting: Psychology

## 2023-09-18 DIAGNOSIS — F411 Generalized anxiety disorder: Secondary | ICD-10-CM

## 2023-09-18 DIAGNOSIS — G4700 Insomnia, unspecified: Secondary | ICD-10-CM

## 2023-09-18 NOTE — Progress Notes (Signed)
 Date: 09/18/2023  Diagnosis G47.00 (Insomnia disorder) [n/a]  300.02 (Generalized anxiety disorder) [n/a]  Symptoms Complains of difficulty remaining asleep. (Status: maintained) -- No Description Entered  Excessive and/or unrealistic worry that is difficult to control occurring more days than not for at least 6 months about a number of events or activities. (Status: maintained) -- No Description Entered  Medication Status compliance  Safety none  If Suicidal or Homicidal State Action Taken: unspecified  Current Risk: low Medications unspecified Objectives Related Problem: Resolve the core conflict that is the source of anxiety. Description: Describe situations, thoughts, feelings, and actions associated with anxieties and worries, their impact on functioning, and attempts to resolve them. Target Date: 2024-02-13 Frequency: Daily Modality: individual Progress: 85%  Related Problem: Resolve the core conflict that is the source of anxiety. Description: Learn and implement calming skills to reduce overall anxiety and manage anxiety symptoms. Target Date: 2024-02-13 Frequency: Daily Modality: individual Progress: 80%  Related Problem: Resolve the core conflict that is the source of anxiety. Description: Learn and implement problem-solving strategies for realistically addressing worries. Target Date: 2024-02-13 Frequency: Daily Modality: individual Progress: 85%  Related Problem: Resolve the core conflict that is the source of anxiety. Description: Maintain involvement in work, family, and social activities. Target Date: 2024-02-13 Frequency: Daily Modality: individual Progress: 85%  Related Problem: Restore restful sleep pattern. Description: Describe the history and details of sleep pattern. Target Date:  2024-02-13 Frequency: Daily Modality: individual Progress: 90%    Client Response full compliance  Service Location Location, [address] [place], [place] [address]  Service Code cpt [phone] P Self-monitoring  Lifestyle change (exercise, nutrition)  Self care activities  Distress tolerance skill  Identify/label emotions  Identify automatic thoughts  Facilitate problem solving  Normalize/Reframe  Behavioral activation plan  Validate/empathize  Comments  Dx.: Generalized Anxiety (F41.1)  Meds: No psychotropics  Goals: [name] is seeking to develop a more satisfying life-style that takes into account her current physical condition. Goal date 11-04-20. States that the current situation in the world (politics and Covid 19) is making her very anxious. She is obsessed with the news (social media) and remains in a constant state of stress. She wants to learn to be less reactive. Will initiate behaviorally oriented individual therapy to address symptoms. Goal date is 10-04-21. While patient has realized some improvement in level of anxiety, she still expresses considerable stress and anxiety. Continues to need better balance in life. Revised date is 12-25. In addition, she has periodic, unexplained sleep issues that compromise her ability to get sufficient sleep. Seeking to resove these problems with improved behavioral strategies. Goal date 12-25. Patient agrees to a Engineering geologist video session and understands the limitations of this platform. She is at home and I am at my home office.   [name] reports that her settings were changed on her Cpap and it is helping her sleep better. She expresses more despair over the state of the country. She is resuming calls to legislators, which helps her feel more empowered.  She and her sister are getting more settled and back to their routine.She is less focused on worries about her physical well-being and is adjusting well to her current living  situation with her sister.                                                                                                                                                                                                                                                                                                                                                                                                                                                                                                                                                                                                                                            `                                                                                                                                         `  CONI ALM KERNS, PhD  Time: 3:15p-4:00p 45 minutes

## 2023-09-25 ENCOUNTER — Ambulatory Visit: Admitting: Psychology

## 2023-09-26 DIAGNOSIS — E669 Obesity, unspecified: Secondary | ICD-10-CM | POA: Diagnosis not present

## 2023-09-26 DIAGNOSIS — F411 Generalized anxiety disorder: Secondary | ICD-10-CM | POA: Diagnosis not present

## 2023-09-26 DIAGNOSIS — M1990 Unspecified osteoarthritis, unspecified site: Secondary | ICD-10-CM | POA: Diagnosis not present

## 2023-09-26 DIAGNOSIS — Z9181 History of falling: Secondary | ICD-10-CM | POA: Diagnosis not present

## 2023-09-26 DIAGNOSIS — R32 Unspecified urinary incontinence: Secondary | ICD-10-CM | POA: Diagnosis not present

## 2023-09-26 DIAGNOSIS — M8580 Other specified disorders of bone density and structure, unspecified site: Secondary | ICD-10-CM | POA: Diagnosis not present

## 2023-09-26 DIAGNOSIS — G4733 Obstructive sleep apnea (adult) (pediatric): Secondary | ICD-10-CM | POA: Diagnosis not present

## 2023-09-26 DIAGNOSIS — I10 Essential (primary) hypertension: Secondary | ICD-10-CM | POA: Diagnosis not present

## 2023-09-26 DIAGNOSIS — Z8249 Family history of ischemic heart disease and other diseases of the circulatory system: Secondary | ICD-10-CM | POA: Diagnosis not present

## 2023-10-02 ENCOUNTER — Ambulatory Visit (INDEPENDENT_AMBULATORY_CARE_PROVIDER_SITE_OTHER): Admitting: Psychology

## 2023-10-02 DIAGNOSIS — G4700 Insomnia, unspecified: Secondary | ICD-10-CM | POA: Diagnosis not present

## 2023-10-02 DIAGNOSIS — F411 Generalized anxiety disorder: Secondary | ICD-10-CM | POA: Diagnosis not present

## 2023-10-02 NOTE — Progress Notes (Signed)
 Date: 10/02/2023  Diagnosis G47.00 (Insomnia disorder) [n/a]  300.02 (Generalized anxiety disorder) [n/a]  Symptoms Complains of difficulty remaining asleep. (Status: maintained) -- No Description Entered  Excessive and/or unrealistic worry that is difficult to control occurring more days than not for at least 6 months about a number of events or activities. (Status: maintained) -- No Description Entered  Medication Status compliance  Safety none  If Suicidal or Homicidal State Action Taken: unspecified  Current Risk: low Medications unspecified Objectives Related Problem: Resolve the core conflict that is the source of anxiety. Description: Describe situations, thoughts, feelings, and actions associated with anxieties and worries, their impact on functioning, and attempts to resolve them. Target Date: 2024-02-13 Frequency: Daily Modality: individual Progress: 85%  Related Problem: Resolve the core conflict that is the source of anxiety. Description: Learn and implement calming skills to reduce overall anxiety and manage anxiety symptoms. Target Date: 2024-02-13 Frequency: Daily Modality: individual Progress: 80%  Related Problem: Resolve the core conflict that is the source of anxiety. Description: Learn and implement problem-solving strategies for realistically addressing worries. Target Date: 2024-02-13 Frequency: Daily Modality: individual Progress: 85%  Related Problem: Resolve the core conflict that is the source of anxiety. Description: Maintain involvement in work, family, and social activities. Target Date: 2024-02-13 Frequency: Daily Modality: individual Progress: 85%  Related Problem: Restore restful sleep pattern. Description: Describe the history and details of sleep  pattern. Target Date: 2024-02-13 Frequency: Daily Modality: individual Progress: 90%    Client Response full compliance  Service Location Location, [address] [place], [place] [address]  Service Code cpt [phone] P Self-monitoring  Lifestyle change (exercise, nutrition)  Self care activities  Distress tolerance skill  Identify/label emotions  Identify automatic thoughts  Facilitate problem solving  Normalize/Reframe  Behavioral activation plan  Validate/empathize  Comments  Dx.: Generalized Anxiety (F41.1)  Meds: No psychotropics  Goals: [name] is seeking to develop a more satisfying life-style that takes into account her current physical condition. Goal date 11-04-20. States that the current situation in the world (politics and Covid 19) is making her very anxious. She is obsessed with the news (social media) and remains in a constant state of stress. She wants to learn to be less reactive. Will initiate behaviorally oriented individual therapy to address symptoms. Goal date is 10-04-21. While patient has realized some improvement in level of anxiety, she still expresses considerable stress and anxiety. Continues to need better balance in life. Revised date is 12-25. In addition, she has periodic, unexplained sleep issues that compromise her ability to get sufficient sleep. Seeking to resove these problems with improved behavioral strategies. Goal date 12-25. Patient agrees to a Engineering geologist video session and understands the limitations of this platform. She is at home and I am at my home office.   [name] states that she is concerned that her Cpap machine is mis calibrated. Also, her sleep is disturbed lately. 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 She has not been told what her diagnosis means and she has concerns about what she should do. [name] says that she is limiting some  social engagements to help keep stress low. She worries conversations will turn political and she wants to avoid those situations.                                                                                                                                                                                                                                                                                                                                                                                                                                                                                                                                                                                                                                              `                                                                                                                                         `  CONI ALM KERNS, PhD  Time: 3:10p-4:00p 50 minutes

## 2023-10-08 ENCOUNTER — Encounter: Payer: Self-pay | Admitting: Neurology

## 2023-10-08 DIAGNOSIS — G4733 Obstructive sleep apnea (adult) (pediatric): Secondary | ICD-10-CM | POA: Diagnosis not present

## 2023-10-09 ENCOUNTER — Ambulatory Visit (INDEPENDENT_AMBULATORY_CARE_PROVIDER_SITE_OTHER): Admitting: Psychology

## 2023-10-09 DIAGNOSIS — G4700 Insomnia, unspecified: Secondary | ICD-10-CM

## 2023-10-09 DIAGNOSIS — F411 Generalized anxiety disorder: Secondary | ICD-10-CM | POA: Diagnosis not present

## 2023-10-09 NOTE — Progress Notes (Signed)
 Date: 10/09/2023  Diagnosis G47.00 (Insomnia disorder) [n/a]  300.02 (Generalized anxiety disorder) [n/a]  Symptoms Complains of difficulty remaining asleep. (Status: maintained) -- No Description Entered  Excessive and/or unrealistic worry that is difficult to control occurring more days than not for at least 6 months about a number of events or activities. (Status: maintained) -- No Description Entered  Medication Status compliance  Safety none  If Suicidal or Homicidal State Action Taken: unspecified  Current Risk: low Medications unspecified Objectives Related Problem: Resolve the core conflict that is the source of anxiety. Description: Describe situations, thoughts, feelings, and actions associated with anxieties and worries, their impact on functioning, and attempts to resolve them. Target Date: 2024-02-13 Frequency: Daily Modality: individual Progress: 85%  Related Problem: Resolve the core conflict that is the source of anxiety. Description: Learn and implement calming skills to reduce overall anxiety and manage anxiety symptoms. Target Date: 2024-02-13 Frequency: Daily Modality: individual Progress: 80%  Related Problem: Resolve the core conflict that is the source of anxiety. Description: Learn and implement problem-solving strategies for realistically addressing worries. Target Date: 2024-02-13 Frequency: Daily Modality: individual Progress: 85%  Related Problem: Resolve the core conflict that is the source of anxiety. Description: Maintain involvement in work, family, and social activities. Target Date: 2024-02-13 Frequency: Daily Modality: individual Progress: 85%  Related Problem: Restore restful sleep pattern. Description: Describe the  history and details of sleep pattern. Target Date: 2024-02-13 Frequency: Daily Modality: individual Progress: 90%    Client Response full compliance  Service Location Location, [address] [place], [place] [address]  Service Code cpt [phone] P Self-monitoring  Lifestyle change (exercise, nutrition)  Self care activities  Distress tolerance skill  Identify/label emotions  Identify automatic thoughts  Facilitate problem solving  Normalize/Reframe  Behavioral activation plan  Validate/empathize  Comments  Dx.: Generalized Anxiety (F41.1)  Meds: No psychotropics  Goals: Essense is seeking to develop a more satisfying life-style that takes into account her current physical condition. Goal date 11-04-20. States that the current situation in the world (politics and Covid 19) is making her very anxious. She is obsessed with the news (social media) and remains in a constant state of stress. She wants to learn to be less reactive. Will initiate behaviorally oriented individual therapy to address symptoms. Goal date is 10-04-21. While patient has realized some improvement in level of anxiety, she still expresses considerable stress and anxiety. Continues to need better balance in life. Revised date is 12-25. In addition, she has periodic, unexplained sleep issues that compromise her ability to get sufficient sleep. Seeking to resove these problems with improved behavioral strategies. Goal date 12-25. Patient agrees to a Engineering geologist video session and understands the limitations of this platform. She is at home and I am at my home office.   [name] states that she feels terrible. She saw an  old report from an MRI in May 2024 and it indicated a meningioma. She has been obsessed and worried about this. Encouraged her to reach out to the doctor that ordered the test and find out why he feels there is nothing to worry about. She tends to perseverate and we discussed strategies to distract  herself, relying on cognitive principles.                                                                                                                                                                                                                                                                                                                                                                                                                                                                                                                                                                                                                                               `                                                                                                                                         `  Joy ALM KERNS, PhD  Time: 3:10p-4:00p 50 minutes

## 2023-10-16 ENCOUNTER — Ambulatory Visit (INDEPENDENT_AMBULATORY_CARE_PROVIDER_SITE_OTHER): Admitting: Psychology

## 2023-10-16 DIAGNOSIS — F411 Generalized anxiety disorder: Secondary | ICD-10-CM

## 2023-10-16 NOTE — Progress Notes (Signed)
 Date: 10/16/2023  Diagnosis G47.00 (Insomnia disorder) [n/a]  300.02 (Generalized anxiety disorder) [n/a]  Symptoms Complains of difficulty remaining asleep. (Status: maintained) -- No Description Entered  Excessive and/or unrealistic worry that is difficult to control occurring more days than not for at least 6 months about a number of events or activities. (Status: maintained) -- No Description Entered  Medication Status compliance  Safety none  If Suicidal or Homicidal State Action Taken: unspecified  Current Risk: low Medications unspecified Objectives Related Problem: Resolve the core conflict that is the source of anxiety. Description: Describe situations, thoughts, feelings, and actions associated with anxieties and worries, their impact on functioning, and attempts to resolve them. Target Date: 2024-02-13 Frequency: Daily Modality: individual Progress: 85%  Related Problem: Resolve the core conflict that is the source of anxiety. Description: Learn and implement calming skills to reduce overall anxiety and manage anxiety symptoms. Target Date: 2024-02-13 Frequency: Daily Modality: individual Progress: 80%  Related Problem: Resolve the core conflict that is the source of anxiety. Description: Learn and implement problem-solving strategies for realistically addressing worries. Target Date: 2024-02-13 Frequency: Daily Modality: individual Progress: 85%  Related Problem: Resolve the core conflict that is the source of anxiety. Description: Maintain involvement in work, family, and social activities. Target Date: 2024-02-13 Frequency: Daily Modality: individual Progress: 85%  Related Problem: Restore restful sleep  pattern. Description: Describe the history and details of sleep pattern. Target Date: 2024-02-13 Frequency: Daily Modality: individual Progress: 90%    Client Response full compliance  Service Location Location, [address] [place], [place] [address]  Service Code cpt [phone] P Self-monitoring  Lifestyle change (exercise, nutrition)  Self care activities  Distress tolerance skill  Identify/label emotions  Identify automatic thoughts  Facilitate problem solving  Normalize/Reframe  Behavioral activation plan  Validate/empathize  Comments  Dx.: Generalized Anxiety (F41.1)  Meds: No psychotropics  Goals: [name] is seeking to develop a more satisfying life-style that takes into account her current physical condition. Goal date 11-04-20. States that the current situation in the world (politics and Covid 19) is making her very anxious. She is obsessed with the news (social media) and remains in a constant state of stress. She wants to learn to be less reactive. Will initiate behaviorally oriented individual therapy to address symptoms. Goal date is 10-04-21. While patient has realized some improvement in level of anxiety, she still expresses considerable stress and anxiety. Continues to need better balance in life. Revised date is 12-25. In addition, she has periodic, unexplained sleep issues that compromise her ability to get sufficient sleep. Seeking to resove these problems with improved behavioral strategies. Goal date 12-25. Patient agrees to a Engineering geologist video session and understands the limitations of this platform. She is at home and I am  at my home office.   Roben reports that she is relieved that her exploration into her neurological condition revealed that she does not have any reason for concern. Her fears were unfounded. She is back to being obsessed with the state of the world and her despair regarding the current administration. Helped her identify those things for  which she feels gratitude and make her feel happy. She has a difficult time staying focused on anything positive. Her tendency is to gravitate toward negative outcomes.                                                                                                                                                                                                                                                                                                                                                                                                                                                                                                                                                                                                                                                 `                                                                                                                                         `  CONI ALM KERNS, PhD  Time: 3:10p-4:00p 50 minutes

## 2023-10-23 ENCOUNTER — Ambulatory Visit (INDEPENDENT_AMBULATORY_CARE_PROVIDER_SITE_OTHER): Admitting: Psychology

## 2023-10-23 DIAGNOSIS — G4700 Insomnia, unspecified: Secondary | ICD-10-CM | POA: Diagnosis not present

## 2023-10-23 DIAGNOSIS — F411 Generalized anxiety disorder: Secondary | ICD-10-CM | POA: Diagnosis not present

## 2023-10-23 NOTE — Progress Notes (Signed)
 Date: 10/23/2023  Diagnosis G47.00 (Insomnia disorder) [n/a]  300.02 (Generalized anxiety disorder) [n/a]  Symptoms Complains of difficulty remaining asleep. (Status: maintained) -- No Description Entered  Excessive and/or unrealistic worry that is difficult to control occurring more days than not for at least 6 months about a number of events or activities. (Status: maintained) -- No Description Entered  Medication Status compliance  Safety none  If Suicidal or Homicidal State Action Taken: unspecified  Current Risk: low Medications unspecified Objectives Related Problem: Resolve the core conflict that is the source of anxiety. Description: Describe situations, thoughts, feelings, and actions associated with anxieties and worries, their impact on functioning, and attempts to resolve them. Target Date: 2024-02-13 Frequency: Daily Modality: individual Progress: 85%  Related Problem: Resolve the core conflict that is the source of anxiety. Description: Learn and implement calming skills to reduce overall anxiety and manage anxiety symptoms. Target Date: 2024-02-13 Frequency: Daily Modality: individual Progress: 80%  Related Problem: Resolve the core conflict that is the source of anxiety. Description: Learn and implement problem-solving strategies for realistically addressing worries. Target Date: 2024-02-13 Frequency: Daily Modality: individual Progress: 85%  Related Problem: Resolve the core conflict that is the source of anxiety. Description: Maintain involvement in work, family, and social activities. Target Date: 2024-02-13 Frequency: Daily Modality: individual Progress: 85%  Related Problem:  Restore restful sleep pattern. Description: Describe the history and details of sleep pattern. Target Date: 2024-02-13 Frequency: Daily Modality: individual Progress: 90%    Client Response full compliance  Service Location Location, [address] [place], [place] [address]  Service Code cpt [id number] P Self-monitoring  Lifestyle change (exercise, nutrition)  Self care activities  Distress tolerance skill  Identify/label emotions  Identify automatic thoughts  Facilitate problem solving  Normalize/Reframe  Behavioral activation plan  Validate/empathize  Comments  Dx.: Generalized Anxiety (F41.1)  Meds: No psychotropics  Goals: [name] is seeking to develop a more satisfying life-style that takes into account her current physical condition. Goal date 11-04-20. States that the current situation in the world (politics and Covid 19) is making her very anxious. She is obsessed with the news (social media) and remains in a constant state of stress. She wants to learn to be less reactive. Will initiate behaviorally oriented individual therapy to address symptoms. Goal date is 10-04-21. While patient has realized some improvement in level of anxiety, she still expresses considerable stress and anxiety. Continues to need better balance in life. Revised date is 12-25. In addition, she has periodic, unexplained sleep issues that compromise her ability to get sufficient sleep. Seeking to resove these problems with improved behavioral strategies. Goal date 12-25. Patient agrees to a Actuary  session and understands the limitations of this platform. She is at home and I am at my home office.   [name] reports that her sleep has been problematic recently. Says, however, she is not feeling tired. She says she is doing exercises on her own. Talked about ways to remain positive and not get consumed with negativity. Reports she and her sister ave been trying to go out more frequently, but it  is always a challenge.                                                                                                                                                                                                                                                                                                                                                                                                                                                                                                                                                                                                                                               `                                                                                                                                         `  CONI ALM KERNS, PhD  Time: 3:10p-4:00p 50 minutes

## 2023-10-24 NOTE — Progress Notes (Signed)
 SABRA

## 2023-10-26 NOTE — Progress Notes (Signed)
 PATIENT: [name] DOB: 1945/06/09  REASON FOR VISIT: follow up HISTORY FROM: patient  Virtual Visit via MyChart video  I connected with [name] on 10/29/23 at  9:45 AM EDT via MyChart video and verified that I am speaking with the correct person using two identifiers.   I discussed the limitations, risks, security and privacy concerns of performing an evaluation and management service by Mychart video and the availability of in person appointments. I also discussed with the patient that there may be a patient responsible charge related to this service. The patient expressed understanding and agreed to proceed.   History of Present Illness:  10/29/23 ALL (MyChart): [name] is a 78 y.o. female here today for follow up for OSA on CPAP. She was last seen by Dr Buck 10/2022 and doing well. Pressure was increased from 8 to 9 about a month ago as she had noted more apneic events. Since, she continues to use therapy nightly for about 7-8 hours, on average. She is tolerating pressure. AHI remains slightly elevated around 5.8/h. Central events > obstructive. Since, she reports feeling better. She is resting a little better and wakes feeling a little more refreshed. She continues to have difficulty getting back to sleep once she wakes to use restroom. She is typically getting about 6 hours of sleep but then lies in bed for 1-2 hours trying to get back to sleep. She is hesitant to take sleep aids. She does endorse more stress after her sister's fall and with world events. She is followed regularly by PCP.   MRI 07/2022 showed 2.3 cm meningioma at the right middle cranial fossa. Two additional 1 cm meningiomas overlying the right frontal convexity as above. Discussed with Dr Buck through Mychart. Neurosurgery eval advised, however, she is not interested in surgery. Follow up with PCP for routine monitoring per Dr Buck.     History (copied from Dr Obie previous  note)  [name] is a 78 year old female with an underlying complex medical history of hypertension, hypothyroidism, postmenopausal bleeding, vitamin D  deficiency, allergies, anemia, anxiety, interstitial cystitis, osteoarthritis, and obesity, who presents for follow-up consultation of her obstructive sleep apnea after interim testing and starting treatment with a new CPAP machine.  The patient is unaccompanied today.  I saw her on 05/22/2022 for reevaluation of her sleep apnea.  She had been on CPAP of 8 cm with good compliance and good apnea control.  Her machine was not working properly and she needed new equipment.  She was advised to proceed with a home sleep test for reevaluation.  She had a home sleep test through our office on 06/27/2022 which showed moderate obstructive sleep apnea with an AHI of 29.7/h, O2 nadir 83% with moderate to loud snoring detected.  I prescribed a new CPAP machine for her.  Her set up date was 08/11/2022, she has a ResMed air sense 11 AutoSet machine and DME provider is [facility].   Today, 10/26/2022: I reviewed her CPAP compliance data from 09/25/2022 through 10/24/2022, total of 30 days, during which time she used her machine every night with percent use days greater than 4 hours at 100%, indicating superb compliance, average usage of 7 hours and 8 minutes, residual AHI at goal at 2.9/h, leak acceptable with the 95th percentile at 3.3 L/min on a pressure of 8 cm with EPR of 1.  She reports that she had in the first months of difficulty with the tubing but she received a new tube  and it is working well currently.  Sometimes her apnea index goes up to 5 but generally she is doing well and she is using nasal pillows for her mask without problems.  Sometimes her nose gets sore from the interface but other than that things are going well.  She was recently hospitalized some 3 months ago for hypertensive crisis in May 2024.  She did get an interim home sleep test She had a brain MRI with  and without contrast on 07/21/2022 and I reviewed the results:     IMPRESSION: 1. No acute intracranial abnormality. 2. 2.3 cm meningioma at the right middle cranial fossa. Two additional 1 cm meningiomas overlying the right frontal convexity as above. No associated edema or mass effect. 3. Otherwise normal brain MRI for age.   She has not had any sudden onset of one-sided weakness or numbness or droopy face, has had facial tingling at times.   Observations/Objective:  Generalized: Well developed, in no acute distress  Mentation: Alert oriented to time, place, history taking. Follows all commands speech and language fluent   Assessment and Plan:  78 y.o. year old female  has a past medical history of Abnormal perimenopausal bleeding (03/07/2003), Allergy, Anemia, Anxiety, Arthritis, Dysuria (03/06/2002), Endometrial polyp (03/06/2004), Environmental allergies, Fibroid (02/19/2003), H/O varicella, History of measles, mumps, or rubella, colonic polyps (12/15/2010), Hypertension (07/19/2010), Hypothyroidism, Interstitial cystitis, Menopausal symptoms (03/06/2002), Obesity, Osteoarthritis, Osteopenia (02/04/2012), PMB (postmenopausal bleeding) (06/21/2010), Sleep apnea, Urinary incontinence (03/06/2009), and Vitamin D  deficiency. here with    ICD-10-CM   1. OSA on CPAP  G47.33 For home use only DME continuous positive airway pressure (CPAP)    2. Meningioma, multiple (HCC)  D42.9      [name] reports doing fairly well on CPAP therapy. She reports feeling a little better rested with increased pressure of 9cmH20. AHI remains lightly elevated at 5.8/h. We discussed increasing pressure, however, she wishes to continue monitoring for now. I will recheck download in 6 weeks to evaluate need for pressure adjustments. She was encouraged to use complementary therapies for management of insomnia. Consider regular exercise and eliminating midday naps. Continue close follow up with PCP for meningioma  surveillance. Supply orders updated. Healthy lifestyle habits encouraged. She will follow up with me in 1 year, sooner if needed.   Orders Placed This Encounter  Procedures   For home use only DME continuous positive airway pressure (CPAP)    Heated Humidity with all supplies as needed    Length of Need:   Lifetime    Patient has OSA or probable OSA:   Yes    Is the patient currently using CPAP in the home:   Yes    Settings:   Other see comments    CPAP supplies needed:   Mask, headgear, cushions, filters, heated tubing and water chamber    No orders of the defined types were placed in this encounter.    Follow Up Instructions:  I discussed the assessment and treatment plan with the patient. The patient was provided an opportunity to ask questions and all were answered. The patient agreed with the plan and demonstrated an understanding of the instructions.   The patient was advised to call back or seek an in-person evaluation if the symptoms worsen or if the condition fails to improve as anticipated.  I provided 30 minutes of face-to-face and non face-to-face time during this MyChart video encounter. Patient located at their place of residence. Provider is in the office.    Brandi Tomlinson  Lorene Samaan, NP

## 2023-10-26 NOTE — Patient Instructions (Addendum)
 Please continue using your CPAP regularly. While your insurance requires that you use CPAP at least 4 hours each night on 70% of the nights, I recommend, that you not skip any nights and use it throughout the night if you can. Getting used to CPAP and staying with the treatment long term does take time and patience and discipline. Untreated obstructive sleep apnea when it is moderate to severe can have an adverse impact on cardiovascular health and raise her risk for heart disease, arrhythmias, hypertension, congestive heart failure, stroke and diabetes. Untreated obstructive sleep apnea causes sleep disruption, nonrestorative sleep, and sleep deprivation. This can have an impact on your day to day functioning and cause daytime sleepiness and impairment of cognitive function, memory loss, mood disturbance, and problems focussing. Using CPAP regularly can improve these symptoms.  We will update supply orders, today. Continue using complementary therapies to help get back to sleep. Continue sleeping on your side. Consider increasing activity and limiting daytime napping. Consider management of stress techniques per PCP.   I will recheck your download in 6 weeks. If needed, we can increase pressure again.   Follow up in 1 year

## 2023-10-29 ENCOUNTER — Encounter: Payer: Self-pay | Admitting: Family Medicine

## 2023-10-29 ENCOUNTER — Telehealth (INDEPENDENT_AMBULATORY_CARE_PROVIDER_SITE_OTHER): Payer: Medicare PPO | Admitting: Family Medicine

## 2023-10-29 DIAGNOSIS — G4733 Obstructive sleep apnea (adult) (pediatric): Secondary | ICD-10-CM

## 2023-10-29 DIAGNOSIS — D429 Neoplasm of uncertain behavior of meninges, unspecified: Secondary | ICD-10-CM | POA: Diagnosis not present

## 2023-10-29 NOTE — Progress Notes (Signed)
 Community message sent to [facility] that CPAP supplies order placed.

## 2023-10-30 ENCOUNTER — Ambulatory Visit (INDEPENDENT_AMBULATORY_CARE_PROVIDER_SITE_OTHER): Admitting: Psychology

## 2023-10-30 DIAGNOSIS — G4700 Insomnia, unspecified: Secondary | ICD-10-CM | POA: Diagnosis not present

## 2023-10-30 DIAGNOSIS — F411 Generalized anxiety disorder: Secondary | ICD-10-CM

## 2023-10-30 NOTE — Progress Notes (Signed)
 Date: 10/30/2023  Diagnosis G47.00 (Insomnia disorder) [n/a]  300.02 (Generalized anxiety disorder) [n/a]  Symptoms Complains of difficulty remaining asleep. (Status: maintained) -- No Description Entered  Excessive and/or unrealistic worry that is difficult to control occurring more days than not for at least 6 months about a number of events or activities. (Status: maintained) -- No Description Entered  Medication Status compliance  Safety none  If Suicidal or Homicidal State Action Taken: unspecified  Current Risk: low Medications unspecified Objectives Related Problem: Resolve the core conflict that is the source of anxiety. Description: Describe situations, thoughts, feelings, and actions associated with anxieties and worries, their impact on functioning, and attempts to resolve them. Target Date: 2024-02-13 Frequency: Daily Modality: individual Progress: 85%  Related Problem: Resolve the core conflict that is the source of anxiety. Description: Learn and implement calming skills to reduce overall anxiety and manage anxiety symptoms. Target Date: 2024-02-13 Frequency: Daily Modality: individual Progress: 80%  Related Problem: Resolve the core conflict that is the source of anxiety. Description: Learn and implement problem-solving strategies for realistically addressing worries. Target Date: 2024-02-13 Frequency: Daily Modality: individual Progress: 85%  Related Problem: Resolve the core conflict that is the source of anxiety. Description: Maintain involvement in work, family, and social activities. Target Date: 2024-02-13 Frequency: Daily Modality:  individual Progress: 85%  Related Problem: Restore restful sleep pattern. Description: Describe the history and details of sleep pattern. Target Date: 2024-02-13 Frequency: Daily Modality: individual Progress: 90%    Client Response full compliance  Service Location Location, [address] [place], [place] [address]  Service Code cpt [phone] P Self-monitoring  Lifestyle change (exercise, nutrition)  Self care activities  Distress tolerance skill  Identify/label emotions  Identify automatic thoughts  Facilitate problem solving  Normalize/Reframe  Behavioral activation plan  Validate/empathize  Comments  Dx.: Generalized Anxiety (F41.1)  Meds: No psychotropics  Goals: Lyndee is seeking to develop a more satisfying life-style that takes into account her current physical condition. Goal date 11-04-20. States that the current situation in the world (politics and Covid 19) is making her very anxious. She is obsessed with the news (social media) and remains in a constant state of stress. She wants to learn to be less reactive. Will initiate behaviorally oriented individual therapy to address symptoms. Goal date is 10-04-21. While patient has realized some improvement in level of anxiety, she still expresses considerable stress and anxiety. Continues to need better balance in life. Revised date is 12-25. In addition, she has periodic, unexplained sleep issues that compromise her ability to get sufficient sleep. Seeking to resove these  problems with improved behavioral strategies. Goal date 12-25. Patient agrees to a Engineering geologist video session and understands the limitations of this platform. She is at home and I am at my home office.   Amarys says she woke up stressed at 1am. Had a hard time getting back to sleep. She says that all the events in the worls are keeping her anxious. She says that her sister fell this week and it was scary for both. Went to walk in clinic and she was fine.  States that she and her sister are getting back to getting involved in more community activities. She says she is also worried about her niece who is trying to start a business. Rooney is concerned that her niece is being scammed. She talked about efforts to remain healthy. Talked about the option of attending classes at [employer] and she will check it out. Also talked about interplay between physical and emotional health.                                                                                                                                                                                                                                                                                                                                                                                                                                                                                                                                                                                                                                                  `                                                                                                                                         `  CONI ALM KERNS, PhD  Time: 8:40a-9:30a 50 minutes

## 2023-11-06 ENCOUNTER — Ambulatory Visit (INDEPENDENT_AMBULATORY_CARE_PROVIDER_SITE_OTHER): Admitting: Psychology

## 2023-11-06 DIAGNOSIS — F411 Generalized anxiety disorder: Secondary | ICD-10-CM | POA: Diagnosis not present

## 2023-11-06 DIAGNOSIS — G4700 Insomnia, unspecified: Secondary | ICD-10-CM

## 2023-11-06 NOTE — Progress Notes (Signed)
 Date: 11/06/2023  Diagnosis G47.00 (Insomnia disorder) [n/a]  300.02 (Generalized anxiety disorder) [n/a]  Symptoms Complains of difficulty remaining asleep. (Status: maintained) -- No Description Entered  Excessive and/or unrealistic worry that is difficult to control occurring more days than not for at least 6 months about a number of events or activities. (Status: maintained) -- No Description Entered  Medication Status compliance  Safety none  If Suicidal or Homicidal State Action Taken: unspecified  Current Risk: low Medications unspecified Objectives Related Problem: Resolve the core conflict that is the source of anxiety. Description: Describe situations, thoughts, feelings, and actions associated with anxieties and worries, their impact on functioning, and attempts to resolve them. Target Date: 2024-02-13 Frequency: Daily Modality: individual Progress: 85%  Related Problem: Resolve the core conflict that is the source of anxiety. Description: Learn and implement calming skills to reduce overall anxiety and manage anxiety symptoms. Target Date: 2024-02-13 Frequency: Daily Modality: individual Progress: 80%  Related Problem: Resolve the core conflict that is the source of anxiety. Description: Learn and implement problem-solving strategies for realistically addressing worries. Target Date: 2024-02-13 Frequency: Daily Modality: individual Progress: 85%  Related Problem: Resolve the core conflict that is the source of anxiety. Description: Maintain involvement in work, family, and social activities. Target Date: 2024-02-13 Frequency:  Daily Modality: individual Progress: 85%  Related Problem: Restore restful sleep pattern. Description: Describe the history and details of sleep pattern. Target Date: 2024-02-13 Frequency: Daily Modality: individual Progress: 90%    Client Response full compliance  Service Location Location, [address] [place], [place] [address]  Service Code cpt [phone] P Self-monitoring  Lifestyle change (exercise, nutrition)  Self care activities  Distress tolerance skill  Identify/label emotions  Identify automatic thoughts  Facilitate problem solving  Normalize/Reframe  Behavioral activation plan  Validate/empathize  Comments  Dx.: Generalized Anxiety (F41.1)  Meds: No psychotropics  Goals: Nealie is seeking to develop a more satisfying life-style that takes into account her current physical condition. Goal date 11-04-20. States that the current situation in the world (politics and Covid 19) is making her very anxious. She is obsessed with the news (social media) and remains in a constant state of stress. She wants to learn to be less reactive. Will initiate behaviorally oriented individual therapy to address symptoms. Goal date is 10-04-21. While patient has realized some improvement in level of anxiety, she still expresses considerable stress and anxiety. Continues to need better balance in life. Revised date is 12-25. In addition, she has periodic,  unexplained sleep issues that compromise her ability to get sufficient sleep. Seeking to resove these problems with improved behavioral strategies. Goal date 12-25. Patient agrees to a Engineering geologist video session and understands the limitations of this platform. She is at home and I am at my home office.   [name] says the ICE raids are keeping her up at night. She is worried about what it means. States her former in laws are brown and do not understand that they are at risk. We talked about the strategies to calm self down. Is unble to  think about what has worked for her to date. Emphasized importance of getting involved rather than passive observation.                                                                                                                                                                                                                                                                                                                                                                                                                                                                                                                                                                                                                                                     `                                                                                                                                         `  CONI ALM KERNS, PhD  Time: 3:15p-4:00p 45 minutes

## 2023-11-13 ENCOUNTER — Ambulatory Visit (INDEPENDENT_AMBULATORY_CARE_PROVIDER_SITE_OTHER): Admitting: Psychology

## 2023-11-13 DIAGNOSIS — G4700 Insomnia, unspecified: Secondary | ICD-10-CM | POA: Diagnosis not present

## 2023-11-13 DIAGNOSIS — F411 Generalized anxiety disorder: Secondary | ICD-10-CM | POA: Diagnosis not present

## 2023-11-13 NOTE — Progress Notes (Signed)
 Date: 11/13/2023  Diagnosis G47.00 (Insomnia disorder) [n/a]  300.02 (Generalized anxiety disorder) [n/a]  Symptoms Complains of difficulty remaining asleep. (Status: maintained) -- No Description Entered  Excessive and/or unrealistic worry that is difficult to control occurring more days than not for at least 6 months about a number of events or activities. (Status: maintained) -- No Description Entered  Medication Status compliance  Safety none  If Suicidal or Homicidal State Action Taken: unspecified  Current Risk: low Medications unspecified Objectives Related Problem: Resolve the core conflict that is the source of anxiety. Description: Describe situations, thoughts, feelings, and actions associated with anxieties and worries, their impact on functioning, and attempts to resolve them. Target Date: 2024-02-13 Frequency: Daily Modality: individual Progress: 85%  Related Problem: Resolve the core conflict that is the source of anxiety. Description: Learn and implement calming skills to reduce overall anxiety and manage anxiety symptoms. Target Date: 2024-02-13 Frequency: Daily Modality: individual Progress: 80%  Related Problem: Resolve the core conflict that is the source of anxiety. Description: Learn and implement problem-solving strategies for realistically addressing worries. Target Date: 2024-02-13 Frequency: Daily Modality: individual Progress: 85%  Related Problem: Resolve the core conflict that is the source of anxiety. Description: Maintain involvement in work, family, and social activities. Target Date: 2024-02-13 Frequency: Daily Modality: individual Progress: 85%  Related Problem: Restore restful sleep pattern. Description: Describe the history and details of sleep pattern. Target Date: 2024-02-13 Frequency: Daily Modality: individual Progress: 90%    Client Response full compliance  Service Location Location, [address] [place], [place]  [address]  Service Code cpt [phone] P Self-monitoring  Lifestyle change (exercise, nutrition)  Self care activities  Distress tolerance skill  Identify/label emotions  Identify automatic thoughts  Facilitate problem solving  Normalize/Reframe  Behavioral activation plan  Validate/empathize  Comments  Dx.: Generalized Anxiety (F41.1)  Meds: No psychotropics  Goals: [name] is seeking to develop a more satisfying life-style that takes into account her current physical condition. Goal date 11-04-20. States that the current situation in the world (politics and Covid 19) is making her very anxious. She is obsessed with the news (social media) and remains in a constant state of stress. She wants to learn to be less reactive. Will initiate behaviorally oriented individual therapy to address symptoms. Goal date is 10-04-21. While patient has realized some improvement in level of anxiety, she still expresses considerable stress and anxiety. Continues to need better balance in life. Revised date is 12-25. In addition, she has periodic, unexplained sleep issues that compromise her ability to get sufficient sleep. Seeking to resove these problems with improved behavioral strategies. Goal date 12-25. Patient agrees to a Engineering geologist video session and understands the limitations of this platform. She is at home and I am at my home office.   [name] says it has been rough with regard to sleep. Her ability to sleep varies. Triggers are anxiety and worries about global circumstances. Says she doesn't feel well unless she gets at least 7 hours sleep. This is maybe half the time. She is trying to work on anxiety to help her feel more relaxed in initiating and remaining asleep. Discussed improved sleep hygiene.                                                                                                                                                                                                                                                                                                                                                                                                                                                                                                                                                                                                                                                        `                                                                                                                                         `  CONI ALM KERNS, PhD  Time: 3:15p-4:00p 45 minutes

## 2023-11-20 ENCOUNTER — Ambulatory Visit: Admitting: Psychology

## 2023-11-20 DIAGNOSIS — F411 Generalized anxiety disorder: Secondary | ICD-10-CM | POA: Diagnosis not present

## 2023-11-20 NOTE — Progress Notes (Signed)
 Date: 11/20/2023  Diagnosis G47.00 (Insomnia disorder) [n/a]  300.02 (Generalized anxiety disorder) [n/a]  Symptoms Complains of difficulty remaining asleep. (Status: maintained) -- No Description Entered  Excessive and/or unrealistic worry that is difficult to control occurring more days than not for at least 6 months about a number of events or activities. (Status: maintained) -- No Description Entered  Medication Status compliance  Safety none  If Suicidal or Homicidal State Action Taken: unspecified  Current Risk: low Medications unspecified Objectives Related Problem: Resolve the core conflict that is the source of anxiety. Description: Describe situations, thoughts, feelings, and actions associated with anxieties and worries, their impact on functioning, and attempts to resolve them. Target Date: 2024-02-13 Frequency: Daily Modality: individual Progress: 85%  Related Problem: Resolve the core conflict that is the source of anxiety. Description: Learn and implement calming skills to reduce overall anxiety and manage anxiety symptoms. Target Date: 2024-02-13 Frequency: Daily Modality: individual Progress: 80%  Related Problem: Resolve the core conflict that is the source of anxiety. Description: Learn and implement problem-solving strategies for realistically addressing worries. Target Date: 2024-02-13 Frequency: Daily Modality: individual Progress: 85%  Related Problem: Resolve the core conflict that is the source of anxiety. Description: Maintain involvement in work, family, and social activities. Target Date: 2024-02-13 Frequency: Daily Modality: individual Progress: 85%  Related Problem: Restore restful sleep pattern. Description: Describe the history and details of sleep pattern. Target Date: 2024-02-13 Frequency: Daily Modality: individual Progress: 90%    Client Response full compliance  Service Location Location, [address] [place], [place] [address]  Service Code cpt [phone] P Self-monitoring  Lifestyle change (exercise, nutrition)  Self care activities  Distress tolerance skill  Identify/label emotions  Identify automatic thoughts  Facilitate problem solving  Normalize/Reframe  Behavioral activation plan  Validate/empathize  Comments  Dx.: Generalized Anxiety (F41.1)  Meds: No psychotropics  Goals: [name] is seeking to develop a more satisfying life-style that takes into account her current physical condition. Goal date 11-04-20. States that the current situation in the world (politics and Covid 19) is making her very anxious. She is obsessed with the news (social media) and remains in a constant state of stress. She wants to learn to be less reactive. Will initiate behaviorally oriented individual therapy to address symptoms. Goal date is 10-04-21. While patient has realized some improvement in level of anxiety, she still expresses considerable stress and anxiety. Continues to need better balance in life. Revised date is 12-25. In addition, she has periodic, unexplained sleep issues that compromise her ability to get sufficient sleep. Seeking to resove these problems with improved behavioral strategies. Goal date 12-25. Patient agrees to a Engineering geologist video session and understands the limitations of this platform. She is at home and I am at my home office.   [name] says that she and her sister went to a [employer] and enjoyed it. They have been going out and doing a number of errands. She says she is getting a lot done and it is gratifying. Stil complains about how the political news negatively impacts her rest/sleep, but she is managing okay and it doesn't appear extreme.                                                                                                                                                                                                                                                                                                                                                                                                                                                                                                                                                                                                                                                            `                                                                                                                                         `  CONI ALM KERNS, PhD  Time: 3:15p-4:00p 45 minutes

## 2023-11-27 ENCOUNTER — Ambulatory Visit: Admitting: Psychology

## 2023-12-04 ENCOUNTER — Ambulatory Visit: Admitting: Psychology

## 2023-12-04 DIAGNOSIS — G4700 Insomnia, unspecified: Secondary | ICD-10-CM

## 2023-12-04 DIAGNOSIS — F411 Generalized anxiety disorder: Secondary | ICD-10-CM

## 2023-12-04 NOTE — Progress Notes (Signed)
 Date: 12/04/2023  Diagnosis G47.00 (Insomnia disorder) [n/a]  300.02 (Generalized anxiety disorder) [n/a]  Symptoms Complains of difficulty remaining asleep. (Status: maintained) -- No Description Entered  Excessive and/or unrealistic worry that is difficult to control occurring more days than not for at least 6 months about a number of events or activities. (Status: maintained) -- No Description Entered  Medication Status compliance  Safety none  If Suicidal or Homicidal State Action Taken: unspecified  Current Risk: low Medications unspecified Objectives Related Problem: Resolve the core conflict that is the source of anxiety. Description: Describe situations, thoughts, feelings, and actions associated with anxieties and worries, their impact on functioning, and attempts to resolve them. Target Date: 2024-02-13 Frequency: Daily Modality: individual Progress: 85%  Related Problem: Resolve the core conflict that is the source of anxiety. Description: Learn and implement calming skills to reduce overall anxiety and manage anxiety symptoms. Target Date: 2024-02-13 Frequency: Daily Modality: individual Progress: 80%  Related Problem: Resolve the core conflict that is the source of anxiety. Description: Learn and implement problem-solving strategies for realistically addressing worries. Target Date: 2024-02-13 Frequency: Daily Modality: individual Progress: 85%  Related Problem: Resolve the core conflict that is the source of anxiety. Description: Maintain involvement in work, family, and social activities. Target Date: 2024-02-13 Frequency: Daily Modality: individual Progress: 85%  Related Problem: Restore restful sleep pattern. Description: Describe the history and details of sleep pattern. Target Date: 2024-02-13 Frequency: Daily Modality: individual Progress: 90%    Client Response full compliance   Service Location Location, [address] [place], [place] [address]  Service Code cpt [phone] Self-monitoring  Lifestyle change (exercise, nutrition)  Self care activities  Distress tolerance skill  Identify/label emotions  Identify automatic thoughts  Facilitate problem solving  Normalize/Reframe  Behavioral activation plan  Validate/empathize  Comments  Dx.: Generalized Anxiety (F41.1)  Meds: No psychotropics  Goals: Mckinna is seeking to develop a more satisfying life-style that takes into account her current physical condition. Goal date 11-04-20. States that the current situation in the world (politics and Covid 19) is making her very anxious. She is obsessed with the news (social media) and remains in a constant state of stress. She wants to learn to be less reactive. Will initiate behaviorally oriented individual therapy to address symptoms. Goal date is 10-04-21. While patient has realized some improvement in level of anxiety, she still expresses considerable stress and anxiety. Continues to need better balance in life. Revised date is 12-25. In addition, she has periodic, unexplained sleep issues that compromise her ability to get sufficient sleep. Seeking to resove these problems with improved behavioral strategies. Goal date 12-25. Patient agrees to a Engineering geologist video session and understands the limitations of this platform. She is at home and I am at my home office.   Lincoln says she is stressed and trying to take the day off to relax. She tries to go out of the house at least every other day. Her intolerance of people with different political views is intensifying and strategies of distraction are not as effective as before. She is mostly utilizing avoidance of others to manage her rage. Fears if she engages, it will just be a yelling match. She is convinced others just do not understand the gravity of the political situation. 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                                                                                                                                                                                                                                                                                                                                                                                                                                                                                                                                                                                                                                                             `                                                                                                                                         `  CONI ALM KERNS, PhD  Time: 3:15p-4:00p 45 minutes

## 2023-12-11 ENCOUNTER — Ambulatory Visit (INDEPENDENT_AMBULATORY_CARE_PROVIDER_SITE_OTHER): Admitting: Psychology

## 2023-12-11 DIAGNOSIS — G4700 Insomnia, unspecified: Secondary | ICD-10-CM | POA: Diagnosis not present

## 2023-12-11 DIAGNOSIS — F411 Generalized anxiety disorder: Secondary | ICD-10-CM

## 2023-12-11 NOTE — Progress Notes (Signed)
 Date: 12/11/2023  Diagnosis G47.00 (Insomnia disorder) [n/a]  300.02 (Generalized anxiety disorder) [n/a]  Symptoms Complains of difficulty remaining asleep. (Status: maintained) -- No Description Entered  Excessive and/or unrealistic worry that is difficult to control occurring more days than not for at least 6 months about a number of events or activities. (Status: maintained) -- No Description Entered  Medication Status compliance  Safety none  If Suicidal or Homicidal State Action Taken: unspecified  Current Risk: low Medications unspecified Objectives Related Problem: Resolve the core conflict that is the source of anxiety. Description: Describe situations, thoughts, feelings, and actions associated with anxieties and worries, their impact on functioning, and attempts to resolve them. Target Date: 2024-02-13 Frequency: Daily Modality: individual Progress: 85%  Related Problem: Resolve the core conflict that is the source of anxiety. Description: Learn and implement calming skills to reduce overall anxiety and manage anxiety symptoms. Target Date: 2024-02-13 Frequency: Daily Modality: individual Progress: 80%  Related Problem: Resolve the core conflict that is the source of anxiety. Description: Learn and implement problem-solving strategies for realistically addressing worries. Target Date: 2024-02-13 Frequency: Daily Modality: individual Progress: 85%  Related Problem: Resolve the core conflict that is the source of anxiety. Description: Maintain involvement in work, family, and social activities. Target Date: 2024-02-13 Frequency: Daily Modality: individual Progress: 85%  Related Problem: Restore restful sleep pattern. Description: Describe the history and details of sleep pattern. Target Date: 2024-02-13 Frequency: Daily Modality: individual Progress: 90%    Client  Response full compliance  Service Location Location, [address] [place], [place] [address]  Service Code cpt [phone] P Self-monitoring  Lifestyle change (exercise, nutrition)  Self care activities  Distress tolerance skill  Identify/label emotions  Identify automatic thoughts  Facilitate problem solving  Normalize/Reframe  Behavioral activation plan  Validate/empathize  Comments  Dx.: Generalized Anxiety (F41.1)  Meds: No psychotropics  Goals: Rose-Marie is seeking to develop a more satisfying life-style that takes into account her current physical condition. Goal date 11-04-20. States that the current situation in the world (politics and Covid 19) is making her very anxious. She is obsessed with the news (social media) and remains in a constant state of stress. She wants to learn to be less reactive. Will initiate behaviorally oriented individual therapy to address symptoms. Goal date is 10-04-21. While patient has realized some improvement in level of anxiety, she still expresses considerable stress and anxiety. Continues to need better balance in life. Revised date is 12-25. In addition, she has periodic, unexplained sleep issues that compromise her ability to get sufficient sleep. Seeking to resove these problems with improved behavioral strategies. Goal date 12-25. Patient agrees to a Engineering geologist video session and understands the limitations of this platform. She is at home and I am at my home office.   Elsa says she is feeling physically terrible today. She thinks it is related to her stress. Her stress remains focused on the political problems in the world. Efforts to help her mitigate this anxiety have been only marginally successful. She does say that watching comedy on TV helps her relax. She cannot come up with other strategies. 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                                                                                                                                                                                                                                                                                                                                                                                                                                                                                                                                                                                                                                                               `                                                                                                                                         `  CONI ALM KERNS, PhD  Time: 3:10p-4:00p 50 minutes

## 2023-12-18 ENCOUNTER — Ambulatory Visit: Admitting: Psychology

## 2023-12-18 ENCOUNTER — Encounter: Payer: Self-pay | Admitting: Psychology

## 2023-12-18 DIAGNOSIS — G4700 Insomnia, unspecified: Secondary | ICD-10-CM | POA: Diagnosis not present

## 2023-12-18 DIAGNOSIS — F411 Generalized anxiety disorder: Secondary | ICD-10-CM

## 2023-12-18 NOTE — Progress Notes (Signed)
 Date: 12/18/2023  Diagnosis G47.00 (Insomnia disorder) [n/a]  300.02 (Generalized anxiety disorder) [n/a]  Symptoms Complains of difficulty remaining asleep. (Status: maintained) -- No Description Entered  Excessive and/or unrealistic worry that is difficult to control occurring more days than not for at least 6 months about a number of events or activities. (Status: maintained) -- No Description Entered  Medication Status compliance  Safety none  If Suicidal or Homicidal State Action Taken: unspecified  Current Risk: low Medications unspecified Objectives Related Problem: Resolve the core conflict that is the source of anxiety. Description: Describe situations, thoughts, feelings, and actions associated with anxieties and worries, their impact on functioning, and attempts to resolve them. Target Date: 2024-02-13 Frequency: Daily Modality: individual Progress: 85%  Related Problem: Resolve the core conflict that is the source of anxiety. Description: Learn and implement calming skills to reduce overall anxiety and manage anxiety symptoms. Target Date: 2024-02-13 Frequency: Daily Modality: individual Progress: 80%  Related Problem: Resolve the core conflict that is the source of anxiety. Description: Learn and implement problem-solving strategies for realistically addressing worries. Target Date: 2024-02-13 Frequency: Daily Modality: individual Progress: 85%  Related Problem: Resolve the core conflict that is the source of anxiety. Description: Maintain involvement in work, family, and social activities. Target Date: 2024-02-13 Frequency: Daily Modality: individual Progress: 85%  Related Problem: Restore restful sleep pattern. Description: Describe the history and details of sleep pattern. Target Date: 2024-02-13 Frequency: Daily Modality:  individual Progress: 90%    Client Response full compliance  Service Location Location, [address] [place], [place] [address]  Service Code cpt [phone] P Self-monitoring  Lifestyle change (exercise, nutrition)  Self care activities  Distress tolerance skill  Identify/label emotions  Identify automatic thoughts  Facilitate problem solving  Normalize/Reframe  Behavioral activation plan  Validate/empathize  Comments  Dx.: Generalized Anxiety (F41.1)  Meds: No psychotropics  Goals: Hansika is seeking to develop a more satisfying life-style that takes into account her current physical condition. Goal date 11-04-20. States that the current situation in the world (politics and Covid 19) is making her very anxious. She is obsessed with the news (social media) and remains in a constant state of stress. She wants to learn to be less reactive. Will initiate behaviorally oriented individual therapy to address symptoms. Goal date is 10-04-21. While patient has realized some improvement in level of anxiety, she still expresses considerable stress and anxiety. Continues to need better balance in life. Revised date is 12-25. In addition, she has periodic, unexplained sleep issues that compromise her ability to get sufficient sleep. Seeking to resove these problems with improved behavioral strategies. Goal date 12-25. Patient agrees to a Engineering geologist video session and understands the limitations of this platform. She is at home and I am at my home office.   Nneka says she is not sleeping due to leg cramps. She said that she is distraught that people no longer care about each other. She says she is nervious almost all the time. Talked about more productive ways to fill her time. She is going to try to do more cooking (among other chores around house).                                                                                                                                                                                                                                                                                                                                                                                                                                                                                                                                                                                                                                                                    `                                                                                                                                         `  CONI ALM KERNS, PhD  Time: 3:10p-4:00p 50 minutes

## 2023-12-25 ENCOUNTER — Ambulatory Visit (INDEPENDENT_AMBULATORY_CARE_PROVIDER_SITE_OTHER): Admitting: Psychology

## 2023-12-25 DIAGNOSIS — F411 Generalized anxiety disorder: Secondary | ICD-10-CM | POA: Diagnosis not present

## 2023-12-25 DIAGNOSIS — G4700 Insomnia, unspecified: Secondary | ICD-10-CM

## 2023-12-25 NOTE — Progress Notes (Signed)
 Date: 12/25/2023  Diagnosis G47.00 (Insomnia disorder) [n/a]  300.02 (Generalized anxiety disorder) [n/a]  Symptoms Complains of difficulty remaining asleep. (Status: maintained) -- No Description Entered  Excessive and/or unrealistic worry that is difficult to control occurring more days than not for at least 6 months about a number of events or activities. (Status: maintained) -- No Description Entered  Medication Status compliance  Safety none  If Suicidal or Homicidal State Action Taken: unspecified  Current Risk: low Medications unspecified Objectives Related Problem: Resolve the core conflict that is the source of anxiety. Description: Describe situations, thoughts, feelings, and actions associated with anxieties and worries, their impact on functioning, and attempts to resolve them. Target Date: 2024-02-13 Frequency: Daily Modality: individual Progress: 85%  Related Problem: Resolve the core conflict that is the source of anxiety. Description: Learn and implement calming skills to reduce overall anxiety and manage anxiety symptoms. Target Date: 2024-02-13 Frequency: Daily Modality: individual Progress: 80%  Related Problem: Resolve the core conflict that is the source of anxiety. Description: Learn and implement problem-solving strategies for realistically addressing worries. Target Date: 2024-02-13 Frequency: Daily Modality: individual Progress: 85%  Related Problem: Resolve the core conflict that is the source of anxiety. Description: Maintain involvement in work, family, and social activities. Target Date: 2024-02-13 Frequency: Daily Modality: individual Progress: 85%  Related Problem: Restore restful sleep pattern. Description: Describe the history and details of sleep pattern. Target Date: 2024-02-13 Frequency:  Daily Modality: individual Progress: 90%    Client Response full compliance  Service Location Location, [address] [place], [place] [address]  Service Code cpt [phone] P Self-monitoring  Lifestyle change (exercise, nutrition)  Self care activities  Distress tolerance skill  Identify/label emotions  Identify automatic thoughts  Facilitate problem solving  Normalize/Reframe  Behavioral activation plan  Validate/empathize  Comments  Dx.: Generalized Anxiety (F41.1)  Meds: No psychotropics  Goals: Leslee is seeking to develop a more satisfying life-style that takes into account her current physical condition. Goal date 11-04-20. States that the current situation in the world (politics and Covid 19) is making her very anxious. She is obsessed with the news (social media) and remains in a constant state of stress. She wants to learn to be less reactive. Will initiate behaviorally oriented individual therapy to address symptoms. Goal date is 10-04-21. While patient has realized some improvement in level of anxiety, she still expresses considerable stress and anxiety. Continues to need better balance in life. Revised date is 12-25. In addition, she has periodic, unexplained sleep issues that compromise her ability to get sufficient sleep. Seeking to resove these problems with improved behavioral strategies. Goal date 12-25. Patient agrees to a Engineering geologist video session and understands the limitations of this platform. She is at home and I am at my home office.   Laia talked about her distress that her cousin is a MAGA Trump supporter. She has a hard time letting go and accepting the political reality. Again reviewed steps to disconnect from political distress. Also address my absence for the month of November. She claims she feels stable  and will manage her emotions while I am gone.                                                                                                                                                                                                                                                                                                                                                                                                                                                                                                                                                                                                                                                                       `                                                                                                                                         `  CONI ALM KERNS, PhD  Time: 3:10p-4:00p 50 minutes

## 2023-12-26 DIAGNOSIS — E039 Hypothyroidism, unspecified: Secondary | ICD-10-CM | POA: Diagnosis not present

## 2023-12-26 DIAGNOSIS — F419 Anxiety disorder, unspecified: Secondary | ICD-10-CM | POA: Diagnosis not present

## 2023-12-26 DIAGNOSIS — G4733 Obstructive sleep apnea (adult) (pediatric): Secondary | ICD-10-CM | POA: Diagnosis not present

## 2023-12-26 DIAGNOSIS — Z0000 Encounter for general adult medical examination without abnormal findings: Secondary | ICD-10-CM | POA: Diagnosis not present

## 2023-12-26 DIAGNOSIS — M8588 Other specified disorders of bone density and structure, other site: Secondary | ICD-10-CM | POA: Diagnosis not present

## 2023-12-26 DIAGNOSIS — I10 Essential (primary) hypertension: Secondary | ICD-10-CM | POA: Diagnosis not present

## 2023-12-26 DIAGNOSIS — G4700 Insomnia, unspecified: Secondary | ICD-10-CM | POA: Diagnosis not present

## 2023-12-27 ENCOUNTER — Other Ambulatory Visit (HOSPITAL_BASED_OUTPATIENT_CLINIC_OR_DEPARTMENT_OTHER): Payer: Self-pay | Admitting: Family Medicine

## 2023-12-27 DIAGNOSIS — M8580 Other specified disorders of bone density and structure, unspecified site: Secondary | ICD-10-CM

## 2024-01-01 ENCOUNTER — Telehealth: Payer: Self-pay

## 2024-01-01 ENCOUNTER — Ambulatory Visit: Admitting: Psychology

## 2024-01-01 DIAGNOSIS — I10 Essential (primary) hypertension: Secondary | ICD-10-CM

## 2024-01-02 DIAGNOSIS — G4733 Obstructive sleep apnea (adult) (pediatric): Secondary | ICD-10-CM | POA: Diagnosis not present

## 2024-01-03 ENCOUNTER — Telehealth: Payer: Self-pay

## 2024-01-03 NOTE — Telephone Encounter (Signed)
 [name] want to attend the Prep Program Class at [facility]. I will call her back to set a time

## 2024-01-07 DIAGNOSIS — I10 Essential (primary) hypertension: Secondary | ICD-10-CM | POA: Diagnosis not present

## 2024-01-15 ENCOUNTER — Telehealth: Payer: Self-pay

## 2024-01-15 NOTE — Telephone Encounter (Signed)
 Called left a voicemail about the upcome Prep Class at [facility] at 130 pm. Asked to call me back.

## 2024-01-17 ENCOUNTER — Telehealth: Payer: Self-pay

## 2024-01-17 NOTE — Telephone Encounter (Signed)
 Set the Prep Program initial assessment 01/22/24 @ [facility] 11:30 am.   Prep Program Class start date is 02/05/2024 @ 1:30 pm  [employer] in lobby.

## 2024-01-22 NOTE — Progress Notes (Signed)
 [facility]  Patient Details  Name: [name] MRN: [id number] Date of Birth: 1946-01-15 Age: 78 y.o. PCP: Sun, Vyvyan, MD  Vitals:   01/22/24 1525  BP: (!) 168/84  Pulse: (!) 57  SpO2: 98%  Weight: 176 lb 6.4 oz (80 kg)  Height: 5' (1.524 m)     [facility] Eval - 01/22/24 1500       [facility] PREP Location   [facility] Location [facility]      Referral    Referring Provider Sun    Reason for referral Obesitity/Overweight;Other    Program Start Date 02/05/24      Measurement   Waist Circumference 41 inches    Hip Circumference 43.5 inches    Body fat 33.9 percent      Information for Trainer   Current Exercise face book Workouts    Orthopedic Concerns n/a    Current Barriers n/a      Mobility and Daily Activities   I find it easy to walk up or down two or more flights of stairs. 1    I have no trouble taking out the trash. 1    I do housework such as vacuuming and dusting on my own without difficulty. 1    I can easily lift a gallon of milk (8lbs). 4    I can easily walk a mile. 1    I have no trouble reaching into high cupboards or reaching down to pick up something from the floor. 4    I do not have trouble doing out-door work such as loss adjuster, chartered, raking leaves, or gardening. 1      Mobility and Daily Activities   I feel independent. 3    I feel energetic. 2    I live an active life.  2    I feel strong. 3    I feel healthy. 2    I feel active as other people my age. 3      How fit and strong are you.   Fit and Strong Total Score 28         Past Medical History:  Diagnosis Date   Abnormal perimenopausal bleeding 03/07/2003   Allergy    Anemia    Anxiety    Arthritis    Dysuria 03/06/2002   Endometrial polyp 03/06/2004   Environmental allergies    Fibroid 02/19/2003   H/O varicella    History of measles, mumps, or rubella    Hx of colonic polyps 12/15/2010   Hypertension 07/19/2010   echo for pre-op EF 55%  normal left  wall thickness LA mildly dialated with mitral and tricuspid regurgatation   Hypothyroidism    Interstitial cystitis    Menopausal symptoms 03/06/2002   Obesity    Osteoarthritis    of knees   Osteopenia 02/04/2012   -1.8 T score right femur neck   PMB (postmenopausal bleeding) 06/21/2010   Sleep apnea    Urinary incontinence 03/06/2009   Vitamin D  deficiency    history of   Past Surgical History:  Procedure Laterality Date   COLONOSCOPY  12/15/10   HYSTEROSCOPY  2006 and May 2012   for fibroids, endometrial polyps   MOUTH SURGERY     MYOMECTOMY  1974   Social History   Tobacco Use  Smoking Status Never  Smokeless Tobacco Never   Previous evaluation incorrect  [name] will be start Prep Program on 02/05/24 @ [facility] 1:30 pm  [name] 01/22/2024,  3:32 PM

## 2024-01-22 NOTE — Progress Notes (Signed)
 [facility]  Patient Details  Name: [name] MRN: [id number] Date of Birth: Nov 03, 1945 Age: 78 y.o. PCP: Sun, Vyvyan, MD  Vitals:   01/22/24 1351  BP: (!) 142/85  Pulse: 81  SpO2: 96%  Weight: 182 lb 6.4 oz (82.7 kg)  Height: 5' 2 (1.575 m)     [facility] Eval - 01/22/24 1400       [facility] Location   [facility] Location [facility]      Referral    Referring Provider Sun    Reason for referral Inactivity;Other    Program Start Date 02/05/24      Measurement   Waist Circumference 45.5 inches    Hip Circumference 46.5 inches    Body fat 33.3 percent      Information for Trainer   Goals Strength and balance    Current Exercise PT Exercice    Orthopedic Concerns Neck      Mobility and Daily Activities   I find it easy to walk up or down two or more flights of stairs. 2    I have no trouble taking out the trash. 1    I do housework such as vacuuming and dusting on my own without difficulty. 1    I can easily lift a gallon of milk (8lbs). 4    I can easily walk a mile. 1    I have no trouble reaching into high cupboards or reaching down to pick up something from the floor. 2    I do not have trouble doing out-door work such as loss adjuster, chartered, raking leaves, or gardening. 1      Mobility and Daily Activities   I feel younger than my age. 1    I feel independent. 1    I feel energetic. 1    I live an active life.  2    I feel strong. 1    I feel healthy. 1    I feel active as other people my age. 1      How fit and strong are you.   Fit and Strong Total Score 20         Past Medical History:  Diagnosis Date   Abnormal perimenopausal bleeding 03/07/2003   Allergy    Anemia    Anxiety    Arthritis    Dysuria 03/06/2002   Endometrial polyp 03/06/2004   Environmental allergies    Fibroid 02/19/2003   H/O varicella    History of measles, mumps, or rubella    Hx of colonic polyps 12/15/2010   Hypertension 07/19/2010   echo for  pre-op EF 55%  normal left wall thickness LA mildly dialated with mitral and tricuspid regurgatation   Hypothyroidism    Interstitial cystitis    Menopausal symptoms 03/06/2002   Obesity    Osteoarthritis    of knees   Osteopenia 02/04/2012   -1.8 T score right femur neck   PMB (postmenopausal bleeding) 06/21/2010   Sleep apnea    Urinary incontinence 03/06/2009   Vitamin D  deficiency    history of   Past Surgical History:  Procedure Laterality Date   COLONOSCOPY  12/15/10   HYSTEROSCOPY  2006 and May 2012   for fibroids, endometrial polyps   MOUTH SURGERY     MYOMECTOMY  1974   Social History   Tobacco Use  Smoking Status Never  Smokeless Tobacco Never   Aldona will be starting the Prep Program on [facility]/05/24 @ [facility] 1:30 pm  [name] 01/22/2024, 2:12 PM

## 2024-01-23 DIAGNOSIS — I10 Essential (primary) hypertension: Secondary | ICD-10-CM | POA: Diagnosis not present

## 2024-01-23 DIAGNOSIS — Z23 Encounter for immunization: Secondary | ICD-10-CM | POA: Diagnosis not present

## 2024-01-29 ENCOUNTER — Ambulatory Visit (INDEPENDENT_AMBULATORY_CARE_PROVIDER_SITE_OTHER): Admitting: Psychology

## 2024-01-29 DIAGNOSIS — F411 Generalized anxiety disorder: Secondary | ICD-10-CM | POA: Diagnosis not present

## 2024-01-29 DIAGNOSIS — G4700 Insomnia, unspecified: Secondary | ICD-10-CM | POA: Diagnosis not present

## 2024-01-29 NOTE — Progress Notes (Signed)
 Date: 01/29/2024  Diagnosis G47.00 (Insomnia disorder) [n/a]  300.02 (Generalized anxiety disorder) [n/a]  Symptoms Complains of difficulty remaining asleep. (Status: maintained) -- No Description Entered  Excessive and/or unrealistic worry that is difficult to control occurring more days than not for at least 6 months about a number of events or activities. (Status: maintained) -- No Description Entered  Medication Status compliance  Safety none  If Suicidal or Homicidal State Action Taken: unspecified  Current Risk: low Medications unspecified Objectives Related Problem: Resolve the core conflict that is the source of anxiety. Description: Describe situations, thoughts, feelings, and actions associated with anxieties and worries, their impact on functioning, and attempts to resolve them. Target Date: 2024-02-13 Frequency: Daily Modality: individual Progress: 85%  Related Problem: Resolve the core conflict that is the source of anxiety. Description: Learn and implement calming skills to reduce overall anxiety and manage anxiety symptoms. Target Date: 2024-02-13 Frequency: Daily Modality: individual Progress: 80%  Related Problem: Resolve the core conflict that is the source of anxiety. Description: Learn and implement problem-solving strategies for realistically addressing worries. Target Date: 2024-02-13 Frequency: Daily Modality: individual Progress: 85%  Related Problem: Resolve the core conflict that is the source of anxiety. Description: Maintain involvement in work, family, and social activities. Target Date: 2024-02-13 Frequency: Daily Modality: individual Progress: 85%  Related Problem: Restore restful sleep pattern. Description: Describe the history and details of sleep pattern. Target  Date: 2024-02-13 Frequency: Daily Modality: individual Progress: 90%    Client Response full compliance  Service Location Location, [address] [place], [place] [address]  Service Code cpt [phone] P Self-monitoring  Lifestyle change (exercise, nutrition)  Self care activities  Distress tolerance skill  Identify/label emotions  Identify automatic thoughts  Facilitate problem solving  Normalize/Reframe  Behavioral activation plan  Validate/empathize  Comments  Dx.: Generalized Anxiety (F41.1)  Meds: No psychotropics  Goals: Lavere is seeking to develop a more satisfying life-style that takes into account her current physical condition. Goal date 11-04-20. States that the current situation in the world (politics and Covid 19) is making her very anxious. She is obsessed with the news (social media) and remains in a constant state of stress. She wants to learn to be less reactive. Will initiate behaviorally oriented individual therapy to address symptoms. Goal date is 10-04-21. While patient has realized some improvement in level of anxiety, she still expresses considerable stress and anxiety. Continues to need better balance in life. Revised date is 12-25. In addition, she has periodic, unexplained sleep issues that compromise her ability to get sufficient sleep. Seeking to resove these problems with improved behavioral strategies. Goal date 12-25. Patient agrees to a Engineering Geologist video session and understands the limitations of this platform. She is at home and I am at my home office.   Kadesia says she is not doing well because she went out with the child psychotherapist for lunch and she misread her 2 for 1 coupon. She got upset with waiter and yelled  at him. The social worker called her and told her she can no longer go out with her due to her behavior. We talked about why she may have reacted the way she did and what would have been a better response. The social worker's response to  her felt very rejecting and overly judgmental.                                                                                                                                                                                                                                                                                                                                                                                                                                                                                                                                                                                                                                                                          `                                                                                                                                         `  CONI ALM KERNS, PhD  Time: 3:10p-4:00p 50 minutes

## 2024-02-05 ENCOUNTER — Ambulatory Visit: Admitting: Psychology

## 2024-02-05 NOTE — Progress Notes (Signed)
 [facility] Weekly Session  Patient Details  Name: [name] MRN: [id number] Date of Birth: 06/24/1945 Age: 78 y.o. PCP: Sun, Vyvyan, MD  There were no vitals filed for this visit.    [name] 02/05/2024, 3:02 PM

## 2024-02-12 ENCOUNTER — Ambulatory Visit: Admitting: Psychology

## 2024-02-12 NOTE — Progress Notes (Signed)
 [facility] Weekly Session  Patient Details  Name: [name] MRN: [id number] Date of Birth: 1946/01/21 Age: 78 y.o. PCP: Sun, Vyvyan, MD  Vitals:   02/12/24 1511  Weight: 176 lb 3.2 oz (79.9 kg)     [facility] Weekly seesion - 02/12/24 1500       [facility] PREP Location   [facility] PRE[facility] Location [facility]      Weekly Session   Topic Discussed Importance of resistance training;Other ways to be active   Talked about Research Scientist (medical). The importants of moving your body.         [name] 02/12/2024, 3:14 PM

## 2024-02-18 ENCOUNTER — Ambulatory Visit: Admitting: Psychology

## 2024-02-18 DIAGNOSIS — F411 Generalized anxiety disorder: Secondary | ICD-10-CM

## 2024-02-18 NOTE — Progress Notes (Signed)
 Date: 02/18/2024  Diagnosis G47.00 (Insomnia disorder) [n/a]  300.02 (Generalized anxiety disorder) [n/a]  Symptoms Complains of difficulty remaining asleep. (Status: maintained) -- No Description Entered  Excessive and/or unrealistic worry that is difficult to control occurring more days than not for at least 6 months about a number of events or activities. (Status: maintained) -- No Description Entered  Medication Status compliance  Safety none  If Suicidal or Homicidal State Action Taken: unspecified  Current Risk: low Medications unspecified Objectives Related Problem: Resolve the core conflict that is the source of anxiety. Description: Describe situations, thoughts, feelings, and actions associated with anxieties and worries, their impact on functioning, and attempts to resolve them. Target Date: 2025-02-12 Frequency: Daily Modality: individual Progress: 85%  Related Problem: Resolve the core conflict that is the source of anxiety. Description: Learn and implement calming skills to reduce overall anxiety and manage anxiety symptoms. Target Date: 2025-02-12 Frequency: Daily Modality: individual Progress: 85%  Related Problem: Resolve the core conflict that is the source of anxiety. Description: Learn and implement problem-solving strategies for realistically addressing worries. Target Date: 2025-02-12 Frequency: Daily Modality: individual Progress: 90%  Related Problem: Resolve the core conflict that is the source of anxiety. Description: Maintain involvement in work, family, and social activities. Target Date: 2025-02-12 Frequency: Daily Modality: individual Progress: 90%  Related Problem: Restore restful sleep pattern. Description: Describe the history and  details of sleep pattern. Target Date: 2025-02-12 Frequency: Daily Modality: individual Progress: 90%    Client Response full compliance  Service Location Location, [address] [place], [place] [address]  Service Code cpt [id number] P Self-monitoring  Lifestyle change (exercise, nutrition)  Self care activities  Distress tolerance skill  Identify/label emotions  Identify automatic thoughts  Facilitate problem solving  Normalize/Reframe  Behavioral activation plan  Validate/empathize  Comments  Dx.: Generalized Anxiety (F41.1)  Meds: No psychotropics  Goals: [name] is seeking to develop a more satisfying life-style that takes into account her current physical condition. Goal date 11-04-20. States that the current situation in the world (politics and Covid 19) is making her very anxious. She is obsessed with the news (social media) and remains in a constant state of stress. She wants to learn to be less reactive. Will initiate behaviorally oriented individual therapy to address symptoms. Goal date is 10-04-21. While patient has realized some improvement in level of anxiety, she still expresses considerable stress and anxiety. Continues to need better balance in life. Revised date is 12-25. In addition, she has periodic, unexplained sleep issues that compromise her ability to get sufficient sleep. Seeking to resove these problems with improved behavioral strategies. Goal date 12-26. Patient agrees to a Engineering Geologist video session and understands the limitations of this platform. She is at home and I am at my home office.   [name] says she is sleeping better than before. She has gone to some holiday celebrations with her  sister. Will go to sister in law's house for Christmas. Reports she is feeling stable and less volatile. Is settled with what happened with the [employer] social worker in that she accepts that the relationship is changed. Also, is doing better at modulating bothersome  external stressors (news/politics).                                                                                                                                                                                                                                                                                                                                                                                                                                                                                                                                                                                                                                                                            `                                                                                                                                         `  CONI ALM KERNS, PhD  Time: 2:15p-3:00p 45 minutes

## 2024-02-19 ENCOUNTER — Ambulatory Visit: Admitting: Psychology

## 2024-02-19 NOTE — Progress Notes (Signed)
 [facility] Weekly Session  Patient Details  Name: [name] MRN: [id number] Date of Birth: 1945/11/12 Age: 78 y.o. PCP: Sun, Vyvyan, MD  Vitals:   02/19/24 1434  Weight: 176 lb 9.6 oz (80.1 kg)     [facility] Weekly seesion - 02/19/24 1400       [facility] PREP Location   [facility] PREP Location [facility]      Weekly Session   Topic Discussed Healthy eating tips    Classes attended to date 5          [name] 02/19/2024, 2:36 PM

## 2024-02-25 ENCOUNTER — Ambulatory Visit: Admitting: Psychology

## 2024-02-25 DIAGNOSIS — G4700 Insomnia, unspecified: Secondary | ICD-10-CM | POA: Diagnosis not present

## 2024-02-25 DIAGNOSIS — F411 Generalized anxiety disorder: Secondary | ICD-10-CM | POA: Diagnosis not present

## 2024-02-25 NOTE — Progress Notes (Signed)
 "                                                                                                                        Date: 02/25/2024  Diagnosis G47.00 (Insomnia disorder) [n/a]  300.02 (Generalized anxiety disorder) [n/a]  Symptoms Complains of difficulty remaining asleep. (Status: maintained) -- No Description Entered  Excessive and/or unrealistic worry that [name] difficult to control occurring more days than not for at least 6 months about a number of events or activities. (Status: maintained) -- No Description Entered  Medication Status compliance  Safety none  If Suicidal or Homicidal State Action Taken: unspecified  Current Risk: low Medications unspecified Objectives Related Problem: Resolve the core conflict that [name] the source of anxiety. Description: Describe situations, thoughts, feelings, and actions associated with anxieties and worries, their impact on functioning, and attempts to resolve them. Target Date: 2025-02-12 Frequency: Daily Modality: individual Progress: 85%  Related Problem: Resolve the core conflict that [name] the source of anxiety. Description: Learn and implement calming skills to reduce overall anxiety and manage anxiety symptoms. Target Date: 2025-02-12 Frequency: Daily Modality: individual Progress: 85%  Related Problem: Resolve the core conflict that [name] the source of anxiety. Description: Learn and implement problem-solving strategies for realistically addressing worries. Target Date: 2025-02-12 Frequency: Daily Modality: individual Progress: 90%  Related Problem: Resolve the core conflict that [name] the source of anxiety. Description: Maintain involvement in work, family, and social activities. Target Date: 2025-02-12 Frequency: Daily Modality: individual Progress: 90%  Related Problem: Restore restful sleep pattern. Description:  Describe the history and details of sleep pattern. Target Date: 2025-02-12 Frequency: Daily Modality: individual Progress: 90%    Client Response full compliance  Service Location Location, [address] [place], [place] [address]  Service Code cpt [phone] P Self-monitoring  Lifestyle change (exercise, nutrition)  Self care activities  Distress tolerance skill  Identify/label emotions  Identify automatic thoughts  Facilitate problem solving  Normalize/Reframe  Behavioral activation plan  Validate/empathize  Comments  Dx.: Generalized Anxiety (F41.1)  Meds: No psychotropics  Goals: [name] seeking to develop a more satisfying life-style that takes into account her current physical condition. Goal date 11-04-20. States that the current situation in the world (politics and Covid 19) [name] making her very anxious. She [name] obsessed with the news (social media) and remains in a constant state of stress. She wants to learn to be less reactive. Will initiate behaviorally oriented individual therapy to address symptoms. Goal date [name] 10-04-21. While patient has realized some improvement in level of anxiety, she still expresses considerable stress and anxiety. Continues to need better balance in life. Revised date [name] 12-25. In addition, she has periodic, unexplained sleep issues that compromise her ability to get sufficient sleep. Seeking to resove these problems with improved behavioral strategies. Goal date 12-26. Patient agrees to a Engineering Geologist video session and understands the limitations of this platform. She [name] at home and I am at my home office.   [name]  says she [name] back to having inconsistent sleep, and finds she [name] sleeping too much during the day. Explored possible triggers that contributes to poor sleep. Unable to identify any specific reasons for her sleep problems. Asked her to journal activities in order to help determine contributing variables. [name] says that she still feels  abandoned by the [employer] and feels badly about it. We talked about how to not personalize the situation and find alternative supports.                                                                                                                                                                                                                                                                                                                                                                                                                                                                                                                                                                                                                                                                             `                                                                                                                                         `  CONI ALM KERNS, PhD  Time: 2:15p-3:00p 45 minutes                                     "

## 2024-02-26 ENCOUNTER — Ambulatory Visit: Admitting: Psychology

## 2024-02-26 NOTE — Progress Notes (Signed)
 [facility] Weekly Session  Patient Details  Name: [name] MRN: [id number] Date of Birth: 12-30-1945 Age: 78 y.o. PCP: Sun, Vyvyan, MD  Vitals:   02/26/24 1439  Weight: 174 lb 8 oz (79.2 kg)     [facility] Weekly seesion - 02/26/24 1400       [facility] PREP Location   [facility] PREP Location [facility]      Weekly Session   Topic Discussed Health habits   Healthy habits, water, and sugar demo   Classes attended to date 7          [name] 02/26/2024, 2:40 PM

## 2024-03-03 ENCOUNTER — Ambulatory Visit: Admitting: Psychology

## 2024-03-03 DIAGNOSIS — F411 Generalized anxiety disorder: Secondary | ICD-10-CM | POA: Diagnosis not present

## 2024-03-03 NOTE — Progress Notes (Signed)
 "                                                                                                                                       Date: 03/03/2024  Diagnosis G47.00 (Insomnia disorder) [n/a]  300.02 (Generalized anxiety disorder) [n/a]  Symptoms Complains of difficulty remaining asleep. (Status: maintained) -- No Description Entered  Excessive and/or unrealistic worry that is difficult to control occurring more days than not for at least 6 months about a number of events or activities. (Status: maintained) -- No Description Entered  Medication Status compliance  Safety none  If Suicidal or Homicidal State Action Taken: unspecified  Current Risk: low Medications unspecified Objectives Related Problem: Resolve the core conflict that is the source of anxiety. Description: Describe situations, thoughts, feelings, and actions associated with anxieties and worries, their impact on functioning, and attempts to resolve them. Target Date: 2025-02-12 Frequency: Daily Modality: individual Progress: 85%  Related Problem: Resolve the core conflict that is the source of anxiety. Description: Learn and implement calming skills to reduce overall anxiety and manage anxiety symptoms. Target Date: 2025-02-12 Frequency: Daily Modality: individual Progress: 85%  Related Problem: Resolve the core conflict that is the source of anxiety. Description: Learn and implement problem-solving strategies for realistically addressing worries. Target Date: 2025-02-12 Frequency: Daily Modality: individual Progress: 90%  Related Problem: Resolve the core conflict that is the source of anxiety. Description: Maintain involvement in work, family, and social activities. Target Date: 2025-02-12 Frequency: Daily Modality: individual Progress: 90%  Related Problem: Restore  restful sleep pattern. Description: Describe the history and details of sleep pattern. Target Date: 2025-02-12 Frequency: Daily Modality: individual Progress: 90%    Client Response full compliance  Service Location Location, [address] [place], [place] [address]  Service Code cpt [phone] Self-monitoring  Lifestyle change (exercise, nutrition)  Self care activities  Distress tolerance skill  Identify/label emotions  Identify automatic thoughts  Facilitate problem solving  Normalize/Reframe  Behavioral activation plan  Validate/empathize  Comments  Dx.: Generalized Anxiety (F41.1)  Meds: No psychotropics  Goals: [name] is seeking to develop a more satisfying life-style that takes into account her current physical condition. Goal date 11-04-20. States that the current situation in the world (politics and Covid 19) is making her very anxious. She is obsessed with the news (social media) and remains in a constant state of stress. She wants to learn to be less reactive. Will initiate behaviorally oriented individual therapy to address symptoms. Goal date is 10-04-21. While patient has realized some improvement in level of anxiety, she still expresses considerable stress and anxiety. Continues to need better balance in life. Revised date is 12-26. In addition, she has periodic, unexplained sleep issues that compromise her ability to get sufficient sleep. Seeking to resove these problems with improved behavioral strategies. Goal date 12-26. Patient agrees to a Engineering Geologist video session and understands the limitations of this  platform. She is at home and I am at my home office.   [name] says she continues to struggle with sleep and her behavioral strategies have not been consistently effective. We talked about importance of making sure she gets sufficient rest. Last week she was fearful she was having a stroke and called EMS because she was having some trouble talking and says her  mouth was not working well. She checked out fine and did not have to go to hospital. She is thinking it may be related to anxiety.                                                                                                                                                                                                                                                                                                                                                                                                                                                                                                                                                                                                                                                                                  `                                                                                                                                         `  CONI ALM KERNS, PhD  Time: 2:15p-3:00p 45 minutes                                     "

## 2024-03-04 ENCOUNTER — Ambulatory Visit: Admitting: Psychology

## 2024-03-04 NOTE — Progress Notes (Signed)
 [facility] Weekly Session  Patient Details  Name: [name] MRN: [id number] Date of Birth: [birth year] Age: 78 y.o. PCP: Sun, Vyvyan, MD  Vitals:   03/04/24 1448  Weight: 174 lb 8 oz (79.2 kg)     [facility] Weekly seesion - 03/04/24 1400       [facility] Location   [facility] Location [facility]      Weekly Session   Topic Discussed Restaurant Eating   Tip on eating out for better reults and Salt Demo   Classes attended to date 8          [name] 03/04/2024, 2:49 PM

## 2024-03-11 ENCOUNTER — Ambulatory Visit: Admitting: Psychology

## 2024-03-11 DIAGNOSIS — F411 Generalized anxiety disorder: Secondary | ICD-10-CM
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
## 2024-03-11 NOTE — Progress Notes (Signed)
 [facility] Session  Patient Details  Name: [name] MRN: [id number] Date of Birth: Mar 22, 1945 Age: 79 y.o. PCP: Sun, Vyvyan, MD  Vitals:   03/11/24 1434  Weight: 175 lb (79.4 kg)     [facility] Weekly seesion - 03/11/24 1400       [facility] Location   [facility]      Weekly Session   Topic Discussed Stress management and problem solving   Sleep Regimen mindfulness and meditation. How to stress Managements   Classes attended to date 48          [name] 03/11/2024, 2:38 PM  [facility] Weekly Session  Patient Details  Name: [name] MRN: [id number] Date of Birth: October 10, 1945 Age: 79 y.o. PCP: Sun, Vyvyan, MD  Vitals:   03/11/24 1434  Weight: 175 lb (79.4 kg)      [name] 03/11/2024, 2:38 PM

## 2024-03-17 ENCOUNTER — Ambulatory Visit: Admitting: Psychology

## 2024-03-17 DIAGNOSIS — F411 Generalized anxiety disorder: Secondary | ICD-10-CM | POA: Diagnosis not present
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
## 2024-03-18 ENCOUNTER — Ambulatory Visit: Admitting: Psychology

## 2024-03-18 NOTE — Progress Notes (Signed)
 [facility] Weekly Session  Patient Details  Name: [name] MRN: [id number] Date of Birth: 1945/05/21 Age: 79 y.o. PCP: Sun, Vyvyan, MD  Vitals:   03/18/24 1445  Weight: 175 lb 12.8 oz (79.7 kg)     [facility] Weekly seesion - 03/18/24 1400       [facility] Location   [facility] Location [facility]      Weekly Session   Topic Discussed Expectations and non-scale victories   The power of the short term goals and how small win are real big wins.   Classes attended to date 56          [name] 03/18/2024, 2:46 PM

## 2024-03-24 ENCOUNTER — Ambulatory Visit: Admitting: Psychology

## 2024-03-25 ENCOUNTER — Ambulatory Visit: Admitting: Psychology

## 2024-03-25 NOTE — Progress Notes (Signed)
 [facility] Weekly Session  Patient Details  Name: [name] MRN: [id number] Date of Birth: Jun 15, 1945 Age: 79 y.o. PCP: Sun, Vyvyan, MD  Vitals:   03/25/24 1439  Weight: 174 lb 6.4 oz (79.1 kg)      [name] 03/25/2024, 2:43 PM

## 2024-03-31 ENCOUNTER — Ambulatory Visit: Admitting: Psychology

## 2024-03-31 DIAGNOSIS — F411 Generalized anxiety disorder: Secondary | ICD-10-CM
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
## 2024-04-01 ENCOUNTER — Ambulatory Visit: Admitting: Psychology

## 2024-04-07 ENCOUNTER — Ambulatory Visit: Admitting: Psychology

## 2024-04-07 DIAGNOSIS — F411 Generalized anxiety disorder: Secondary | ICD-10-CM | POA: Diagnosis not present

## 2024-04-07 NOTE — Progress Notes (Signed)
 "                 Date: 04/07/2024  Diagnosis G47.00 (Insomnia disorder) [n/a]  300.02 (Generalized anxiety disorder) [n/a]  Symptoms Complains of difficulty remaining asleep. (Status: maintained) -- No Description Entered  Excessive and/or unrealistic worry that is difficult to control occurring more days than not for at least 6 months about a number of events or activities. (Status: maintained) -- No Description Entered  Medication Status compliance  Safety none  If Suicidal or Homicidal State Action Taken: unspecified  Current Risk: low Medications unspecified Objectives Related Problem: Resolve the core conflict that is the source of anxiety. Description: Describe situations, thoughts, feelings, and actions associated with anxieties and worries, their impact on functioning, and attempts to resolve them. Target Date: 2025-02-12 Frequency: Daily Modality: individual Progress: 85%  Related Problem: Resolve the core conflict that is the source of anxiety. Description: Learn and implement calming skills to reduce overall anxiety and manage anxiety symptoms. Target Date: 2025-02-12 Frequency: Daily Modality: individual Progress: 85%  Related Problem: Resolve the core conflict that is the source of anxiety. Description: Learn and implement problem-solving strategies for realistically addressing worries. Target Date: 2025-02-12 Frequency: Daily Modality: individual Progress: 90%  Related Problem: Resolve the core conflict that is the source of anxiety. Description: Maintain involvement in work, family, and social activities. Target Date: 2025-02-12 Frequency: Daily Modality: individual Progress: 90%  Related Problem: Restore restful sleep pattern. Description: Describe the history and details of sleep pattern. Target Date: 2025-02-12 Frequency: Daily Modality: individual Progress: 90%    Client Response full compliance  Service Location Location, [address] [place], [place] [address]  Service Code cpt [phone] P Self-monitoring  Lifestyle change (exercise, nutrition)  Self care activities  Distress tolerance skill  Identify/label emotions  Identify automatic thoughts  Facilitate problem solving  Normalize/Reframe  Behavioral activation plan  Validate/empathize  Comments  Dx.: Generalized Anxiety (F41.1)  Meds: No psychotropics  Goals: [name] is seeking to develop a more satisfying life-style that takes into account her current physical condition. Goal date 11-04-20. States that the current situation in the world (politics and Covid 19) is making her very anxious. She is obsessed with the news (social media) and remains in a constant state of stress. She wants to learn to be less reactive. Will initiate behaviorally oriented individual therapy to address symptoms. Goal date is 10-04-21. While patient has realized some improvement in level of anxiety, she still expresses considerable stress and anxiety. Continues to need better balance in life. Revised date is 12-26. In addition, she has periodic, unexplained sleep issues that compromise her ability to get sufficient sleep. Seeking to resove these problems with improved behavioral strategies. Goal date 12-26. Patient agrees to a [employer] video session and understands the limitations of this platform. She is at home and I am at my home office.   Session note: [name] has not been out of the house due to the weather. This has resulted, in part, on greater exposure to news, which is an anxiety trigger for her. We talked about, again, the need to reduce exposure to the news. She will work on self-soothing exercises.                                                                                                                                                                                                                                                                                                                                                                                                                                                                                                                                                                                                                                                                                                  `                                                                                                                                         `  CONI ALM KERNS, PhD  Time: 2:15p-3:00p 45 minutes                                     "

## 2024-04-08 ENCOUNTER — Ambulatory Visit: Admitting: Psychology

## 2024-04-14 ENCOUNTER — Ambulatory Visit: Admitting: Psychology

## 2024-04-15 ENCOUNTER — Ambulatory Visit: Admitting: Psychology

## 2024-04-21 ENCOUNTER — Ambulatory Visit: Admitting: Psychology

## 2024-04-22 ENCOUNTER — Ambulatory Visit: Admitting: Psychology

## 2024-04-29 ENCOUNTER — Ambulatory Visit: Admitting: Psychology

## 2024-05-06 ENCOUNTER — Ambulatory Visit: Admitting: Psychology

## 2024-05-13 ENCOUNTER — Ambulatory Visit: Admitting: Psychology

## 2024-05-20 ENCOUNTER — Ambulatory Visit: Admitting: Psychology

## 2024-05-27 ENCOUNTER — Ambulatory Visit: Admitting: Psychology

## 2024-06-03 ENCOUNTER — Ambulatory Visit: Admitting: Psychology

## 2024-06-10 ENCOUNTER — Ambulatory Visit: Admitting: Psychology

## 2024-06-17 ENCOUNTER — Ambulatory Visit: Admitting: Psychology

## 2024-06-24 ENCOUNTER — Ambulatory Visit: Admitting: Psychology

## 2024-07-01 ENCOUNTER — Ambulatory Visit: Admitting: Psychology

## 2024-08-11 ENCOUNTER — Ambulatory Visit: Admitting: Internal Medicine
